# Patient Record
Sex: Male | Born: 1945 | Race: White | Hispanic: No | Smoking: Never smoker
Health system: Southern US, Community
[De-identification: ages and names within clinical notes are randomized; demographics above are authoritative.]

## PROBLEM LIST (undated history)

## (undated) DIAGNOSIS — E669 Obesity, unspecified: Secondary | ICD-10-CM

## (undated) DIAGNOSIS — Z95828 Presence of other vascular implants and grafts: Secondary | ICD-10-CM

## (undated) DIAGNOSIS — K921 Melena: Secondary | ICD-10-CM

## (undated) DIAGNOSIS — K56609 Unspecified intestinal obstruction, unspecified as to partial versus complete obstruction: Secondary | ICD-10-CM

## (undated) DIAGNOSIS — C44320 Squamous cell carcinoma of skin of unspecified parts of face: Secondary | ICD-10-CM

## (undated) DIAGNOSIS — E785 Hyperlipidemia, unspecified: Secondary | ICD-10-CM

## (undated) DIAGNOSIS — J189 Pneumonia, unspecified organism: Secondary | ICD-10-CM

## (undated) DIAGNOSIS — K7581 Nonalcoholic steatohepatitis (NASH): Secondary | ICD-10-CM

## (undated) DIAGNOSIS — R55 Syncope and collapse: Secondary | ICD-10-CM

## (undated) DIAGNOSIS — J90 Pleural effusion, not elsewhere classified: Secondary | ICD-10-CM

## (undated) DIAGNOSIS — K922 Gastrointestinal hemorrhage, unspecified: Secondary | ICD-10-CM

## (undated) DIAGNOSIS — N200 Calculus of kidney: Secondary | ICD-10-CM

## (undated) DIAGNOSIS — D689 Coagulation defect, unspecified: Secondary | ICD-10-CM

## (undated) DIAGNOSIS — D649 Anemia, unspecified: Secondary | ICD-10-CM

## (undated) DIAGNOSIS — K2210 Ulcer of esophagus without bleeding: Secondary | ICD-10-CM

## (undated) DIAGNOSIS — I10 Essential (primary) hypertension: Secondary | ICD-10-CM

## (undated) DIAGNOSIS — K219 Gastro-esophageal reflux disease without esophagitis: Secondary | ICD-10-CM

## (undated) DIAGNOSIS — F32A Depression, unspecified: Secondary | ICD-10-CM

## (undated) DIAGNOSIS — I482 Chronic atrial fibrillation, unspecified: Secondary | ICD-10-CM

## (undated) DIAGNOSIS — N189 Chronic kidney disease, unspecified: Secondary | ICD-10-CM

## (undated) DIAGNOSIS — I8500 Esophageal varices without bleeding: Secondary | ICD-10-CM

## (undated) DIAGNOSIS — M1990 Unspecified osteoarthritis, unspecified site: Secondary | ICD-10-CM

## (undated) DIAGNOSIS — I272 Pulmonary hypertension, unspecified: Secondary | ICD-10-CM

## (undated) DIAGNOSIS — C801 Malignant (primary) neoplasm, unspecified: Secondary | ICD-10-CM

## (undated) DIAGNOSIS — F329 Major depressive disorder, single episode, unspecified: Secondary | ICD-10-CM

## (undated) DIAGNOSIS — Z923 Personal history of irradiation: Secondary | ICD-10-CM

## (undated) DIAGNOSIS — R0602 Shortness of breath: Secondary | ICD-10-CM

## (undated) DIAGNOSIS — K5090 Crohn's disease, unspecified, without complications: Secondary | ICD-10-CM

## (undated) DIAGNOSIS — K449 Diaphragmatic hernia without obstruction or gangrene: Secondary | ICD-10-CM

## (undated) HISTORY — DX: Personal history of irradiation: Z92.3

## (undated) HISTORY — DX: Major depressive disorder, single episode, unspecified: F32.9

## (undated) HISTORY — DX: Ulcer of esophagus without bleeding: K22.10

## (undated) HISTORY — PX: CARDIAC CATHETERIZATION: SHX172

## (undated) HISTORY — PX: OTHER SURGICAL HISTORY: SHX169

## (undated) HISTORY — DX: Pleural effusion, not elsewhere classified: J90

## (undated) HISTORY — DX: Malignant (primary) neoplasm, unspecified: C80.1

## (undated) HISTORY — DX: Crohn's disease, unspecified, without complications: K50.90

## (undated) HISTORY — DX: Obesity, unspecified: E66.9

## (undated) HISTORY — DX: Squamous cell carcinoma of skin of unspecified parts of face: C44.320

## (undated) HISTORY — PX: CHOLECYSTECTOMY: SHX55

## (undated) HISTORY — DX: Unspecified intestinal obstruction, unspecified as to partial versus complete obstruction: K56.609

## (undated) HISTORY — DX: Anemia, unspecified: D64.9

## (undated) HISTORY — DX: Presence of other vascular implants and grafts: Z95.828

## (undated) HISTORY — DX: Calculus of kidney: N20.0

## (undated) HISTORY — PX: CIRCUMCISION: SUR203

## (undated) HISTORY — DX: Essential (primary) hypertension: I10

## (undated) HISTORY — DX: Nonalcoholic steatohepatitis (NASH): K75.81

## (undated) HISTORY — DX: Pulmonary hypertension, unspecified: I27.20

## (undated) HISTORY — DX: Gastrointestinal hemorrhage, unspecified: K92.2

## (undated) HISTORY — PX: COLONOSCOPY: SHX174

## (undated) HISTORY — DX: Depression, unspecified: F32.A

## (undated) HISTORY — DX: Syncope and collapse: R55

## (undated) HISTORY — DX: Chronic kidney disease, unspecified: N18.9

## (undated) HISTORY — DX: Diaphragmatic hernia without obstruction or gangrene: K44.9

## (undated) HISTORY — DX: Hyperlipidemia, unspecified: E78.5

## (undated) HISTORY — DX: Esophageal varices without bleeding: I85.00

## (undated) HISTORY — DX: Chronic atrial fibrillation, unspecified: I48.20

---

## 2000-07-21 ENCOUNTER — Encounter: Payer: Self-pay | Admitting: Internal Medicine

## 2000-07-21 ENCOUNTER — Ambulatory Visit (HOSPITAL_COMMUNITY): Admission: RE | Admit: 2000-07-21 | Discharge: 2000-07-21 | Payer: Self-pay | Admitting: Internal Medicine

## 2000-09-08 ENCOUNTER — Encounter (HOSPITAL_COMMUNITY): Admission: RE | Admit: 2000-09-08 | Discharge: 2000-10-08 | Payer: Self-pay | Admitting: Internal Medicine

## 2000-09-13 ENCOUNTER — Ambulatory Visit (HOSPITAL_COMMUNITY): Admission: RE | Admit: 2000-09-13 | Discharge: 2000-09-13 | Payer: Self-pay | Admitting: Internal Medicine

## 2000-09-13 ENCOUNTER — Encounter: Payer: Self-pay | Admitting: Internal Medicine

## 2002-04-02 ENCOUNTER — Ambulatory Visit (HOSPITAL_COMMUNITY): Admission: RE | Admit: 2002-04-02 | Discharge: 2002-04-02 | Payer: Self-pay | Admitting: Internal Medicine

## 2002-04-02 ENCOUNTER — Encounter: Payer: Self-pay | Admitting: Internal Medicine

## 2002-04-16 ENCOUNTER — Ambulatory Visit (HOSPITAL_COMMUNITY): Admission: RE | Admit: 2002-04-16 | Discharge: 2002-04-16 | Payer: Self-pay | Admitting: Urology

## 2002-04-16 ENCOUNTER — Encounter: Payer: Self-pay | Admitting: Urology

## 2002-08-06 ENCOUNTER — Inpatient Hospital Stay (HOSPITAL_COMMUNITY): Admission: AD | Admit: 2002-08-06 | Discharge: 2002-08-13 | Payer: Self-pay | Admitting: Internal Medicine

## 2002-08-09 ENCOUNTER — Encounter (INDEPENDENT_AMBULATORY_CARE_PROVIDER_SITE_OTHER): Payer: Self-pay | Admitting: Internal Medicine

## 2002-08-30 ENCOUNTER — Encounter: Payer: Self-pay | Admitting: Internal Medicine

## 2002-08-30 ENCOUNTER — Ambulatory Visit (HOSPITAL_COMMUNITY): Admission: RE | Admit: 2002-08-30 | Discharge: 2002-08-30 | Payer: Self-pay | Admitting: Internal Medicine

## 2002-09-04 ENCOUNTER — Ambulatory Visit (HOSPITAL_COMMUNITY): Admission: RE | Admit: 2002-09-04 | Discharge: 2002-09-04 | Payer: Self-pay | Admitting: Internal Medicine

## 2002-09-04 ENCOUNTER — Encounter: Payer: Self-pay | Admitting: Internal Medicine

## 2002-09-04 ENCOUNTER — Encounter (INDEPENDENT_AMBULATORY_CARE_PROVIDER_SITE_OTHER): Payer: Self-pay | Admitting: Specialist

## 2002-09-30 ENCOUNTER — Encounter: Payer: Self-pay | Admitting: Emergency Medicine

## 2002-09-30 ENCOUNTER — Inpatient Hospital Stay (HOSPITAL_COMMUNITY): Admission: EM | Admit: 2002-09-30 | Discharge: 2002-10-04 | Payer: Self-pay | Admitting: Emergency Medicine

## 2002-12-04 ENCOUNTER — Ambulatory Visit (HOSPITAL_COMMUNITY): Admission: RE | Admit: 2002-12-04 | Discharge: 2002-12-04 | Payer: Self-pay | Admitting: Urology

## 2003-11-25 ENCOUNTER — Ambulatory Visit: Payer: Self-pay | Admitting: *Deleted

## 2003-12-06 ENCOUNTER — Ambulatory Visit: Payer: Self-pay | Admitting: Internal Medicine

## 2003-12-06 ENCOUNTER — Observation Stay (HOSPITAL_COMMUNITY): Admission: AD | Admit: 2003-12-06 | Discharge: 2003-12-07 | Payer: Self-pay | Admitting: Internal Medicine

## 2003-12-16 ENCOUNTER — Ambulatory Visit: Payer: Self-pay | Admitting: *Deleted

## 2003-12-19 ENCOUNTER — Ambulatory Visit: Payer: Self-pay | Admitting: Gastroenterology

## 2004-01-06 ENCOUNTER — Ambulatory Visit (HOSPITAL_COMMUNITY): Admission: RE | Admit: 2004-01-06 | Discharge: 2004-01-06 | Payer: Self-pay | Admitting: Family Medicine

## 2004-01-14 ENCOUNTER — Encounter: Admission: RE | Admit: 2004-01-14 | Discharge: 2004-01-14 | Payer: Self-pay | Admitting: Oncology

## 2004-01-14 ENCOUNTER — Encounter (HOSPITAL_COMMUNITY): Admission: RE | Admit: 2004-01-14 | Discharge: 2004-02-13 | Payer: Self-pay | Admitting: Oncology

## 2004-01-14 ENCOUNTER — Ambulatory Visit (HOSPITAL_COMMUNITY): Payer: Self-pay | Admitting: Oncology

## 2004-01-15 ENCOUNTER — Ambulatory Visit (HOSPITAL_COMMUNITY): Admission: RE | Admit: 2004-01-15 | Discharge: 2004-01-15 | Payer: Self-pay | Admitting: Urology

## 2004-01-15 ENCOUNTER — Ambulatory Visit: Payer: Self-pay | Admitting: *Deleted

## 2004-01-24 ENCOUNTER — Ambulatory Visit: Payer: Self-pay | Admitting: *Deleted

## 2004-01-25 ENCOUNTER — Inpatient Hospital Stay (HOSPITAL_COMMUNITY): Admission: EM | Admit: 2004-01-25 | Discharge: 2004-01-29 | Payer: Self-pay | Admitting: Emergency Medicine

## 2004-01-25 ENCOUNTER — Ambulatory Visit: Payer: Self-pay | Admitting: Internal Medicine

## 2004-01-27 ENCOUNTER — Ambulatory Visit: Payer: Self-pay | Admitting: Internal Medicine

## 2004-02-07 ENCOUNTER — Ambulatory Visit: Payer: Self-pay | Admitting: Internal Medicine

## 2004-02-10 ENCOUNTER — Ambulatory Visit: Payer: Self-pay | Admitting: Cardiology

## 2004-02-11 ENCOUNTER — Ambulatory Visit (HOSPITAL_COMMUNITY): Admission: RE | Admit: 2004-02-11 | Discharge: 2004-02-11 | Payer: Self-pay | Admitting: Internal Medicine

## 2004-02-14 ENCOUNTER — Encounter: Admission: RE | Admit: 2004-02-14 | Discharge: 2004-02-14 | Payer: Self-pay | Admitting: Oncology

## 2004-02-14 ENCOUNTER — Encounter (HOSPITAL_COMMUNITY): Admission: RE | Admit: 2004-02-14 | Discharge: 2004-03-15 | Payer: Self-pay | Admitting: Oncology

## 2004-03-11 ENCOUNTER — Ambulatory Visit: Payer: Self-pay | Admitting: Internal Medicine

## 2004-03-23 ENCOUNTER — Encounter (HOSPITAL_COMMUNITY): Admission: RE | Admit: 2004-03-23 | Discharge: 2004-04-22 | Payer: Self-pay | Admitting: Oncology

## 2004-03-23 ENCOUNTER — Encounter: Admission: RE | Admit: 2004-03-23 | Discharge: 2004-03-23 | Payer: Self-pay | Admitting: Oncology

## 2004-03-23 ENCOUNTER — Ambulatory Visit (HOSPITAL_COMMUNITY): Payer: Self-pay | Admitting: Oncology

## 2004-04-24 ENCOUNTER — Encounter: Admission: RE | Admit: 2004-04-24 | Discharge: 2004-04-24 | Payer: Self-pay | Admitting: Oncology

## 2004-05-12 ENCOUNTER — Ambulatory Visit: Payer: Self-pay | Admitting: Cardiology

## 2004-05-20 ENCOUNTER — Ambulatory Visit: Payer: Self-pay | Admitting: *Deleted

## 2004-06-19 ENCOUNTER — Ambulatory Visit (HOSPITAL_COMMUNITY): Payer: Self-pay | Admitting: Oncology

## 2004-06-19 ENCOUNTER — Encounter: Admission: RE | Admit: 2004-06-19 | Discharge: 2004-06-19 | Payer: Self-pay | Admitting: Oncology

## 2004-07-30 ENCOUNTER — Ambulatory Visit: Payer: Self-pay | Admitting: Internal Medicine

## 2004-08-24 ENCOUNTER — Encounter (HOSPITAL_COMMUNITY): Admission: RE | Admit: 2004-08-24 | Discharge: 2004-09-23 | Payer: Self-pay | Admitting: Oncology

## 2004-08-24 ENCOUNTER — Encounter: Admission: RE | Admit: 2004-08-24 | Discharge: 2004-08-24 | Payer: Self-pay | Admitting: Oncology

## 2004-08-25 ENCOUNTER — Ambulatory Visit (HOSPITAL_COMMUNITY): Payer: Self-pay | Admitting: Oncology

## 2004-09-28 ENCOUNTER — Encounter (HOSPITAL_COMMUNITY): Admission: RE | Admit: 2004-09-28 | Discharge: 2004-10-02 | Payer: Self-pay | Admitting: Oncology

## 2004-09-28 ENCOUNTER — Encounter: Admission: RE | Admit: 2004-09-28 | Discharge: 2004-10-02 | Payer: Self-pay | Admitting: Oncology

## 2004-09-28 ENCOUNTER — Ambulatory Visit: Payer: Self-pay | Admitting: Internal Medicine

## 2004-09-28 ENCOUNTER — Ambulatory Visit: Payer: Self-pay | Admitting: Cardiology

## 2004-10-05 ENCOUNTER — Encounter (HOSPITAL_COMMUNITY): Admission: RE | Admit: 2004-10-05 | Discharge: 2004-11-04 | Payer: Self-pay | Admitting: Oncology

## 2004-10-05 ENCOUNTER — Encounter: Admission: RE | Admit: 2004-10-05 | Discharge: 2004-10-05 | Payer: Self-pay | Admitting: Oncology

## 2004-10-05 ENCOUNTER — Ambulatory Visit: Payer: Self-pay | Admitting: Cardiology

## 2004-10-12 ENCOUNTER — Ambulatory Visit (HOSPITAL_COMMUNITY): Payer: Self-pay | Admitting: Oncology

## 2004-11-03 ENCOUNTER — Ambulatory Visit: Payer: Self-pay | Admitting: Cardiology

## 2004-12-02 ENCOUNTER — Ambulatory Visit: Payer: Self-pay | Admitting: Internal Medicine

## 2005-02-12 ENCOUNTER — Inpatient Hospital Stay (HOSPITAL_COMMUNITY): Admission: AD | Admit: 2005-02-12 | Discharge: 2005-02-14 | Payer: Self-pay | Admitting: Internal Medicine

## 2005-02-12 ENCOUNTER — Ambulatory Visit: Payer: Self-pay | Admitting: Urgent Care

## 2005-02-22 ENCOUNTER — Encounter (HOSPITAL_COMMUNITY): Admission: RE | Admit: 2005-02-22 | Discharge: 2005-03-24 | Payer: Self-pay | Admitting: Oncology

## 2005-02-22 ENCOUNTER — Ambulatory Visit (HOSPITAL_COMMUNITY): Payer: Self-pay | Admitting: Oncology

## 2005-02-22 ENCOUNTER — Encounter: Admission: RE | Admit: 2005-02-22 | Discharge: 2005-02-22 | Payer: Self-pay | Admitting: Oncology

## 2005-03-29 ENCOUNTER — Ambulatory Visit: Payer: Self-pay | Admitting: Internal Medicine

## 2005-04-05 ENCOUNTER — Encounter: Admission: RE | Admit: 2005-04-05 | Discharge: 2005-04-05 | Payer: Self-pay | Admitting: Oncology

## 2005-04-05 ENCOUNTER — Encounter (HOSPITAL_COMMUNITY): Admission: RE | Admit: 2005-04-05 | Discharge: 2005-05-05 | Payer: Self-pay | Admitting: Oncology

## 2005-04-23 ENCOUNTER — Ambulatory Visit (HOSPITAL_COMMUNITY): Payer: Self-pay | Admitting: Oncology

## 2005-05-18 ENCOUNTER — Ambulatory Visit (HOSPITAL_COMMUNITY): Admission: RE | Admit: 2005-05-18 | Discharge: 2005-05-18 | Payer: Self-pay | Admitting: Family Medicine

## 2005-05-19 ENCOUNTER — Encounter: Admission: RE | Admit: 2005-05-19 | Discharge: 2005-05-19 | Payer: Self-pay | Admitting: Oncology

## 2005-05-19 ENCOUNTER — Encounter (HOSPITAL_COMMUNITY): Admission: RE | Admit: 2005-05-19 | Discharge: 2005-06-18 | Payer: Self-pay | Admitting: Oncology

## 2005-06-15 ENCOUNTER — Ambulatory Visit (HOSPITAL_COMMUNITY): Payer: Self-pay | Admitting: Oncology

## 2005-06-21 ENCOUNTER — Ambulatory Visit: Payer: Self-pay | Admitting: Internal Medicine

## 2005-07-06 ENCOUNTER — Encounter: Admission: RE | Admit: 2005-07-06 | Discharge: 2005-07-06 | Payer: Self-pay | Admitting: Oncology

## 2005-07-06 ENCOUNTER — Encounter (HOSPITAL_COMMUNITY): Admission: RE | Admit: 2005-07-06 | Discharge: 2005-08-05 | Payer: Self-pay | Admitting: Oncology

## 2005-07-14 ENCOUNTER — Ambulatory Visit: Payer: Self-pay | Admitting: Cardiology

## 2005-07-14 ENCOUNTER — Ambulatory Visit (HOSPITAL_COMMUNITY): Admission: RE | Admit: 2005-07-14 | Discharge: 2005-07-14 | Payer: Self-pay | Admitting: Internal Medicine

## 2005-07-14 ENCOUNTER — Ambulatory Visit: Payer: Self-pay | Admitting: Internal Medicine

## 2005-07-14 HISTORY — PX: ESOPHAGOGASTRODUODENOSCOPY: SHX1529

## 2005-08-10 ENCOUNTER — Emergency Department (HOSPITAL_COMMUNITY): Admission: EM | Admit: 2005-08-10 | Discharge: 2005-08-10 | Payer: Self-pay | Admitting: Emergency Medicine

## 2005-08-10 ENCOUNTER — Ambulatory Visit: Payer: Self-pay | Admitting: Cardiology

## 2005-08-10 ENCOUNTER — Inpatient Hospital Stay (HOSPITAL_COMMUNITY): Admission: EM | Admit: 2005-08-10 | Discharge: 2005-08-14 | Payer: Self-pay | Admitting: Emergency Medicine

## 2005-08-14 ENCOUNTER — Emergency Department (HOSPITAL_COMMUNITY): Admission: EM | Admit: 2005-08-14 | Discharge: 2005-08-15 | Payer: Self-pay | Admitting: Emergency Medicine

## 2005-08-17 ENCOUNTER — Encounter: Admission: RE | Admit: 2005-08-17 | Discharge: 2005-08-17 | Payer: Self-pay | Admitting: Oncology

## 2005-09-29 ENCOUNTER — Ambulatory Visit (HOSPITAL_COMMUNITY): Payer: Self-pay | Admitting: Oncology

## 2005-09-29 ENCOUNTER — Encounter: Admission: RE | Admit: 2005-09-29 | Discharge: 2005-10-02 | Payer: Self-pay | Admitting: Oncology

## 2005-10-05 ENCOUNTER — Ambulatory Visit: Payer: Self-pay | Admitting: Internal Medicine

## 2005-10-20 ENCOUNTER — Encounter (HOSPITAL_COMMUNITY): Admission: RE | Admit: 2005-10-20 | Discharge: 2005-11-19 | Payer: Self-pay | Admitting: Oncology

## 2005-10-20 ENCOUNTER — Encounter: Admission: RE | Admit: 2005-10-20 | Discharge: 2005-10-20 | Payer: Self-pay | Admitting: Oncology

## 2005-11-17 ENCOUNTER — Ambulatory Visit (HOSPITAL_COMMUNITY): Payer: Self-pay | Admitting: Oncology

## 2005-11-22 ENCOUNTER — Ambulatory Visit: Payer: Self-pay | Admitting: Internal Medicine

## 2005-11-23 ENCOUNTER — Ambulatory Visit (HOSPITAL_COMMUNITY): Admission: RE | Admit: 2005-11-23 | Discharge: 2005-11-23 | Payer: Self-pay | Admitting: Internal Medicine

## 2005-11-23 ENCOUNTER — Encounter (INDEPENDENT_AMBULATORY_CARE_PROVIDER_SITE_OTHER): Payer: Self-pay | Admitting: Specialist

## 2005-12-07 ENCOUNTER — Ambulatory Visit: Payer: Self-pay | Admitting: Internal Medicine

## 2005-12-09 ENCOUNTER — Ambulatory Visit: Payer: Self-pay | Admitting: Cardiology

## 2005-12-21 ENCOUNTER — Ambulatory Visit: Payer: Self-pay | Admitting: Internal Medicine

## 2005-12-24 ENCOUNTER — Ambulatory Visit: Payer: Self-pay | Admitting: Cardiology

## 2006-01-04 DIAGNOSIS — R55 Syncope and collapse: Secondary | ICD-10-CM

## 2006-01-04 HISTORY — DX: Syncope and collapse: R55

## 2006-01-04 HISTORY — PX: THORACIC SPINE SURGERY: SHX802

## 2006-01-06 ENCOUNTER — Emergency Department (HOSPITAL_COMMUNITY): Admission: EM | Admit: 2006-01-06 | Discharge: 2006-01-06 | Payer: Self-pay | Admitting: Emergency Medicine

## 2006-01-19 ENCOUNTER — Ambulatory Visit (HOSPITAL_COMMUNITY): Payer: Self-pay | Admitting: Oncology

## 2006-01-19 ENCOUNTER — Encounter (HOSPITAL_COMMUNITY): Admission: RE | Admit: 2006-01-19 | Discharge: 2006-02-18 | Payer: Self-pay | Admitting: Oncology

## 2006-02-03 ENCOUNTER — Inpatient Hospital Stay (HOSPITAL_COMMUNITY): Admission: EM | Admit: 2006-02-03 | Discharge: 2006-02-17 | Payer: Self-pay | Admitting: Emergency Medicine

## 2006-02-04 ENCOUNTER — Ambulatory Visit: Payer: Self-pay | Admitting: Vascular Surgery

## 2006-02-04 ENCOUNTER — Encounter: Payer: Self-pay | Admitting: Emergency Medicine

## 2006-02-04 ENCOUNTER — Encounter: Payer: Self-pay | Admitting: Vascular Surgery

## 2006-02-05 ENCOUNTER — Encounter: Payer: Self-pay | Admitting: Neurological Surgery

## 2006-02-05 ENCOUNTER — Ambulatory Visit: Payer: Self-pay | Admitting: Cardiology

## 2006-02-09 ENCOUNTER — Encounter: Payer: Self-pay | Admitting: Cardiology

## 2006-02-10 ENCOUNTER — Ambulatory Visit: Payer: Self-pay | Admitting: Gastroenterology

## 2006-04-15 ENCOUNTER — Ambulatory Visit (HOSPITAL_COMMUNITY): Payer: Self-pay | Admitting: Oncology

## 2006-04-15 ENCOUNTER — Encounter (HOSPITAL_COMMUNITY): Admission: RE | Admit: 2006-04-15 | Discharge: 2006-05-15 | Payer: Self-pay | Admitting: Oncology

## 2006-05-06 ENCOUNTER — Ambulatory Visit (HOSPITAL_COMMUNITY): Payer: Self-pay | Admitting: Oncology

## 2006-06-16 ENCOUNTER — Encounter (HOSPITAL_COMMUNITY): Admission: RE | Admit: 2006-06-16 | Discharge: 2006-07-16 | Payer: Self-pay | Admitting: Internal Medicine

## 2006-06-21 ENCOUNTER — Encounter (HOSPITAL_COMMUNITY): Admission: RE | Admit: 2006-06-21 | Discharge: 2006-07-21 | Payer: Self-pay | Admitting: Oncology

## 2006-06-21 ENCOUNTER — Ambulatory Visit (HOSPITAL_COMMUNITY): Payer: Self-pay | Admitting: Oncology

## 2006-07-18 ENCOUNTER — Encounter (HOSPITAL_COMMUNITY): Admission: RE | Admit: 2006-07-18 | Discharge: 2006-07-25 | Payer: Self-pay | Admitting: Internal Medicine

## 2006-07-26 ENCOUNTER — Ambulatory Visit: Payer: Self-pay | Admitting: Cardiovascular Disease

## 2006-07-26 ENCOUNTER — Inpatient Hospital Stay (HOSPITAL_COMMUNITY): Admission: AD | Admit: 2006-07-26 | Discharge: 2006-07-28 | Payer: Self-pay

## 2006-07-26 ENCOUNTER — Ambulatory Visit: Payer: Self-pay | Admitting: Cardiology

## 2006-07-27 ENCOUNTER — Encounter: Payer: Self-pay | Admitting: Cardiovascular Disease

## 2006-07-29 LAB — CONVERTED CEMR LAB
Ferritin: 21 ng/mL
OCCULT 1: NEGATIVE

## 2006-08-01 ENCOUNTER — Ambulatory Visit (HOSPITAL_COMMUNITY): Admission: RE | Admit: 2006-08-01 | Discharge: 2006-08-02 | Payer: Self-pay | Admitting: Urology

## 2006-08-03 ENCOUNTER — Encounter (HOSPITAL_COMMUNITY): Admission: RE | Admit: 2006-08-03 | Discharge: 2006-08-04 | Payer: Self-pay | Admitting: Oncology

## 2006-08-03 ENCOUNTER — Ambulatory Visit (HOSPITAL_COMMUNITY): Admission: RE | Admit: 2006-08-03 | Discharge: 2006-08-03 | Payer: Self-pay | Admitting: Family Medicine

## 2006-08-03 ENCOUNTER — Encounter (HOSPITAL_COMMUNITY): Admission: RE | Admit: 2006-08-03 | Discharge: 2006-08-04 | Payer: Self-pay | Admitting: Internal Medicine

## 2006-08-04 ENCOUNTER — Ambulatory Visit: Payer: Self-pay | Admitting: Cardiology

## 2006-08-05 ENCOUNTER — Encounter (HOSPITAL_COMMUNITY): Admission: RE | Admit: 2006-08-05 | Discharge: 2006-09-04 | Payer: Self-pay | Admitting: Internal Medicine

## 2006-08-05 ENCOUNTER — Ambulatory Visit: Payer: Self-pay | Admitting: Cardiology

## 2006-08-11 ENCOUNTER — Ambulatory Visit: Payer: Self-pay | Admitting: Internal Medicine

## 2006-08-17 ENCOUNTER — Emergency Department (HOSPITAL_COMMUNITY): Admission: EM | Admit: 2006-08-17 | Discharge: 2006-08-17 | Payer: Self-pay | Admitting: Emergency Medicine

## 2006-08-24 ENCOUNTER — Encounter (HOSPITAL_COMMUNITY): Admission: RE | Admit: 2006-08-24 | Discharge: 2006-09-23 | Payer: Self-pay | Admitting: Oncology

## 2006-08-24 ENCOUNTER — Ambulatory Visit (HOSPITAL_COMMUNITY): Payer: Self-pay | Admitting: Oncology

## 2006-09-02 ENCOUNTER — Ambulatory Visit: Payer: Self-pay | Admitting: Cardiology

## 2006-09-07 ENCOUNTER — Encounter (HOSPITAL_COMMUNITY): Admission: RE | Admit: 2006-09-07 | Discharge: 2006-10-04 | Payer: Self-pay | Admitting: Internal Medicine

## 2006-09-16 ENCOUNTER — Ambulatory Visit: Payer: Self-pay | Admitting: Cardiology

## 2006-10-06 ENCOUNTER — Encounter (HOSPITAL_COMMUNITY): Admission: RE | Admit: 2006-10-06 | Discharge: 2006-11-05 | Payer: Self-pay | Admitting: Oncology

## 2006-10-17 ENCOUNTER — Ambulatory Visit (HOSPITAL_COMMUNITY): Payer: Self-pay | Admitting: Oncology

## 2006-11-08 ENCOUNTER — Ambulatory Visit (HOSPITAL_COMMUNITY): Admission: RE | Admit: 2006-11-08 | Discharge: 2006-11-08 | Payer: Self-pay | Admitting: Cardiology

## 2006-11-08 ENCOUNTER — Ambulatory Visit: Payer: Self-pay | Admitting: Cardiology

## 2006-11-25 ENCOUNTER — Encounter (HOSPITAL_COMMUNITY): Admission: RE | Admit: 2006-11-25 | Discharge: 2006-12-25 | Payer: Self-pay | Admitting: Oncology

## 2006-12-16 ENCOUNTER — Ambulatory Visit (HOSPITAL_COMMUNITY): Payer: Self-pay | Admitting: Oncology

## 2007-01-06 ENCOUNTER — Encounter (HOSPITAL_COMMUNITY): Admission: RE | Admit: 2007-01-06 | Discharge: 2007-02-05 | Payer: Self-pay | Admitting: Oncology

## 2007-02-16 ENCOUNTER — Ambulatory Visit (HOSPITAL_COMMUNITY): Admission: RE | Admit: 2007-02-16 | Discharge: 2007-02-16 | Payer: Self-pay | Admitting: Family Medicine

## 2007-02-17 ENCOUNTER — Ambulatory Visit (HOSPITAL_COMMUNITY): Admission: RE | Admit: 2007-02-17 | Discharge: 2007-02-17 | Payer: Self-pay | Admitting: Family Medicine

## 2007-02-17 ENCOUNTER — Encounter: Payer: Self-pay | Admitting: Pulmonary Disease

## 2007-02-17 ENCOUNTER — Ambulatory Visit (HOSPITAL_COMMUNITY): Payer: Self-pay | Admitting: Oncology

## 2007-02-17 ENCOUNTER — Encounter (HOSPITAL_COMMUNITY): Admission: RE | Admit: 2007-02-17 | Discharge: 2007-03-19 | Payer: Self-pay | Admitting: Oncology

## 2007-02-22 ENCOUNTER — Ambulatory Visit: Payer: Self-pay | Admitting: Pulmonary Disease

## 2007-02-22 DIAGNOSIS — E785 Hyperlipidemia, unspecified: Secondary | ICD-10-CM

## 2007-02-22 DIAGNOSIS — J90 Pleural effusion, not elsewhere classified: Secondary | ICD-10-CM | POA: Insufficient documentation

## 2007-02-24 ENCOUNTER — Ambulatory Visit (HOSPITAL_COMMUNITY): Admission: RE | Admit: 2007-02-24 | Discharge: 2007-02-24 | Payer: Self-pay | Admitting: Pulmonary Disease

## 2007-02-24 ENCOUNTER — Encounter (INDEPENDENT_AMBULATORY_CARE_PROVIDER_SITE_OTHER): Payer: Self-pay | Admitting: Interventional Radiology

## 2007-02-24 LAB — CONVERTED CEMR LAB
Basophils Relative: 0.5 % (ref 0.0–1.0)
Eosinophils Relative: 1.5 % (ref 0.0–5.0)
HCT: 31.4 % — ABNORMAL LOW (ref 39.0–52.0)
Hemoglobin: 9.9 g/dL — ABNORMAL LOW (ref 13.0–17.0)
INR: 1.2 — ABNORMAL HIGH (ref 0.8–1.0)
Monocytes Absolute: 0.6 10*3/uL (ref 0.2–0.7)
Monocytes Relative: 9.5 % (ref 3.0–11.0)
Neutrophils Relative %: 81.1 % — ABNORMAL HIGH (ref 43.0–77.0)
Prothrombin Time: 13.2 s (ref 10.9–13.3)
RDW: 19.5 % — ABNORMAL HIGH (ref 11.5–14.6)
WBC: 6.4 10*3/uL (ref 4.5–10.5)
aPTT: 27.9 s (ref 21.7–29.8)

## 2007-03-03 ENCOUNTER — Ambulatory Visit: Payer: Self-pay | Admitting: Internal Medicine

## 2007-03-09 ENCOUNTER — Encounter (INDEPENDENT_AMBULATORY_CARE_PROVIDER_SITE_OTHER): Payer: Self-pay | Admitting: Diagnostic Radiology

## 2007-03-09 ENCOUNTER — Ambulatory Visit (HOSPITAL_COMMUNITY): Admission: RE | Admit: 2007-03-09 | Discharge: 2007-03-09 | Payer: Self-pay | Admitting: Internal Medicine

## 2007-03-22 ENCOUNTER — Ambulatory Visit: Payer: Self-pay | Admitting: Internal Medicine

## 2007-03-24 ENCOUNTER — Ambulatory Visit (HOSPITAL_COMMUNITY): Admission: RE | Admit: 2007-03-24 | Discharge: 2007-03-24 | Payer: Self-pay | Admitting: Internal Medicine

## 2007-03-30 ENCOUNTER — Encounter (HOSPITAL_COMMUNITY): Admission: RE | Admit: 2007-03-30 | Discharge: 2007-04-29 | Payer: Self-pay | Admitting: Oncology

## 2007-04-06 ENCOUNTER — Ambulatory Visit: Payer: Self-pay | Admitting: Pulmonary Disease

## 2007-04-11 ENCOUNTER — Ambulatory Visit (HOSPITAL_COMMUNITY): Admission: RE | Admit: 2007-04-11 | Discharge: 2007-04-11 | Payer: Self-pay | Admitting: Internal Medicine

## 2007-04-20 ENCOUNTER — Ambulatory Visit (HOSPITAL_COMMUNITY): Payer: Self-pay | Admitting: Oncology

## 2007-05-10 ENCOUNTER — Encounter (HOSPITAL_COMMUNITY): Admission: RE | Admit: 2007-05-10 | Discharge: 2007-06-09 | Payer: Self-pay | Admitting: Oncology

## 2007-06-21 ENCOUNTER — Encounter (HOSPITAL_COMMUNITY): Admission: RE | Admit: 2007-06-21 | Discharge: 2007-07-21 | Payer: Self-pay | Admitting: Oncology

## 2007-06-21 ENCOUNTER — Ambulatory Visit (HOSPITAL_COMMUNITY): Payer: Self-pay | Admitting: Oncology

## 2007-07-03 ENCOUNTER — Inpatient Hospital Stay (HOSPITAL_COMMUNITY): Admission: EM | Admit: 2007-07-03 | Discharge: 2007-07-06 | Payer: Self-pay | Admitting: Emergency Medicine

## 2007-07-03 ENCOUNTER — Ambulatory Visit: Payer: Self-pay | Admitting: Cardiovascular Disease

## 2007-07-04 ENCOUNTER — Ambulatory Visit: Payer: Self-pay | Admitting: Gastroenterology

## 2007-07-05 ENCOUNTER — Ambulatory Visit: Payer: Self-pay | Admitting: Gastroenterology

## 2007-07-06 LAB — CONVERTED CEMR LAB: TSH: 3.8 microintl units/mL

## 2007-08-02 ENCOUNTER — Ambulatory Visit (HOSPITAL_COMMUNITY): Payer: Self-pay | Admitting: Oncology

## 2007-08-02 ENCOUNTER — Encounter (HOSPITAL_COMMUNITY): Admission: RE | Admit: 2007-08-02 | Discharge: 2007-09-01 | Payer: Self-pay | Admitting: Oncology

## 2007-08-23 ENCOUNTER — Ambulatory Visit (HOSPITAL_COMMUNITY): Payer: Self-pay | Admitting: Oncology

## 2007-09-05 ENCOUNTER — Ambulatory Visit: Payer: Self-pay | Admitting: Internal Medicine

## 2007-09-13 ENCOUNTER — Encounter (HOSPITAL_COMMUNITY): Admission: RE | Admit: 2007-09-13 | Discharge: 2007-10-13 | Payer: Self-pay | Admitting: Oncology

## 2007-10-25 ENCOUNTER — Encounter (HOSPITAL_COMMUNITY): Admission: RE | Admit: 2007-10-25 | Discharge: 2007-11-24 | Payer: Self-pay | Admitting: Oncology

## 2007-10-25 ENCOUNTER — Ambulatory Visit (HOSPITAL_COMMUNITY): Payer: Self-pay | Admitting: Oncology

## 2007-10-27 ENCOUNTER — Ambulatory Visit: Payer: Self-pay | Admitting: Cardiology

## 2007-12-19 ENCOUNTER — Ambulatory Visit: Payer: Self-pay | Admitting: Internal Medicine

## 2007-12-22 ENCOUNTER — Ambulatory Visit (HOSPITAL_COMMUNITY): Payer: Self-pay | Admitting: Oncology

## 2007-12-22 ENCOUNTER — Encounter (HOSPITAL_COMMUNITY): Admission: RE | Admit: 2007-12-22 | Discharge: 2008-01-21 | Payer: Self-pay | Admitting: Oncology

## 2007-12-25 ENCOUNTER — Encounter: Payer: Self-pay | Admitting: Internal Medicine

## 2007-12-25 LAB — CONVERTED CEMR LAB
CO2: 22 meq/L (ref 19–32)
Chloride: 98 meq/L (ref 96–112)
Creatinine, Ser: 1.27 mg/dL (ref 0.40–1.50)

## 2008-01-12 ENCOUNTER — Encounter: Payer: Self-pay | Admitting: Gastroenterology

## 2008-01-12 LAB — CONVERTED CEMR LAB
BUN: 34 mg/dL — ABNORMAL HIGH (ref 6–23)
Chloride: 101 meq/L (ref 96–112)
Creatinine, Ser: 1.27 mg/dL (ref 0.40–1.50)

## 2008-01-13 ENCOUNTER — Inpatient Hospital Stay (HOSPITAL_COMMUNITY): Admission: EM | Admit: 2008-01-13 | Discharge: 2008-01-17 | Payer: Self-pay | Admitting: Emergency Medicine

## 2008-01-13 ENCOUNTER — Ambulatory Visit: Payer: Self-pay | Admitting: Internal Medicine

## 2008-01-16 ENCOUNTER — Ambulatory Visit: Payer: Self-pay | Admitting: Internal Medicine

## 2008-01-17 ENCOUNTER — Ambulatory Visit: Payer: Self-pay | Admitting: Internal Medicine

## 2008-01-21 ENCOUNTER — Inpatient Hospital Stay (HOSPITAL_COMMUNITY): Admission: EM | Admit: 2008-01-21 | Discharge: 2008-01-25 | Payer: Self-pay | Admitting: Emergency Medicine

## 2008-01-22 ENCOUNTER — Ambulatory Visit: Payer: Self-pay | Admitting: Internal Medicine

## 2008-01-24 ENCOUNTER — Ambulatory Visit: Payer: Self-pay | Admitting: Gastroenterology

## 2008-02-02 ENCOUNTER — Encounter (HOSPITAL_COMMUNITY): Admission: RE | Admit: 2008-02-02 | Discharge: 2008-03-03 | Payer: Self-pay | Admitting: Oncology

## 2008-02-22 ENCOUNTER — Ambulatory Visit: Payer: Self-pay | Admitting: Internal Medicine

## 2008-02-23 ENCOUNTER — Ambulatory Visit (HOSPITAL_COMMUNITY): Payer: Self-pay | Admitting: Oncology

## 2008-02-23 ENCOUNTER — Encounter: Payer: Self-pay | Admitting: Internal Medicine

## 2008-02-27 LAB — CONVERTED CEMR LAB
ALT: 13 units/L (ref 0–53)
AST: 17 units/L (ref 0–37)
Albumin: 3.1 g/dL — ABNORMAL LOW (ref 3.5–5.2)
Basophils Absolute: 0 10*3/uL (ref 0.0–0.1)
Basophils Relative: 0 % (ref 0–1)
Calcium: 8.7 mg/dL (ref 8.4–10.5)
Chloride: 97 meq/L (ref 96–112)
MCHC: 30.8 g/dL (ref 30.0–36.0)
Monocytes Relative: 10 % (ref 3–12)
Neutro Abs: 5.1 10*3/uL (ref 1.7–7.7)
Neutrophils Relative %: 81 % — ABNORMAL HIGH (ref 43–77)
Potassium: 5 meq/L (ref 3.5–5.3)
RBC: 4.21 M/uL — ABNORMAL LOW (ref 4.22–5.81)
RDW: 17.3 % — ABNORMAL HIGH (ref 11.5–15.5)
Sodium: 133 meq/L — ABNORMAL LOW (ref 135–145)

## 2008-03-04 ENCOUNTER — Ambulatory Visit (HOSPITAL_COMMUNITY): Admission: RE | Admit: 2008-03-04 | Discharge: 2008-03-04 | Payer: Self-pay | Admitting: Internal Medicine

## 2008-04-02 ENCOUNTER — Encounter (HOSPITAL_COMMUNITY): Admission: RE | Admit: 2008-04-02 | Discharge: 2008-05-03 | Payer: Self-pay | Admitting: Oncology

## 2008-04-23 ENCOUNTER — Ambulatory Visit (HOSPITAL_COMMUNITY): Payer: Self-pay | Admitting: Oncology

## 2008-04-29 ENCOUNTER — Encounter (INDEPENDENT_AMBULATORY_CARE_PROVIDER_SITE_OTHER): Payer: Self-pay

## 2008-05-01 ENCOUNTER — Encounter: Payer: Self-pay | Admitting: Internal Medicine

## 2008-05-01 ENCOUNTER — Encounter: Payer: Self-pay | Admitting: Gastroenterology

## 2008-05-07 ENCOUNTER — Encounter: Payer: Self-pay | Admitting: Internal Medicine

## 2008-05-15 ENCOUNTER — Encounter (HOSPITAL_COMMUNITY): Admission: RE | Admit: 2008-05-15 | Discharge: 2008-05-16 | Payer: Self-pay | Admitting: Oncology

## 2008-05-16 ENCOUNTER — Encounter: Payer: Self-pay | Admitting: Emergency Medicine

## 2008-05-17 ENCOUNTER — Ambulatory Visit: Payer: Self-pay | Admitting: Internal Medicine

## 2008-05-17 ENCOUNTER — Inpatient Hospital Stay (HOSPITAL_COMMUNITY): Admission: EM | Admit: 2008-05-17 | Discharge: 2008-05-20 | Payer: Self-pay | Admitting: Internal Medicine

## 2008-05-17 LAB — CONVERTED CEMR LAB
BUN: 24 mg/dL — ABNORMAL HIGH (ref 6–23)
Basophils Absolute: 0 10*3/uL (ref 0.0–0.1)
Basophils Relative: 0 % (ref 0–1)
Calcium: 8.2 mg/dL — ABNORMAL LOW (ref 8.4–10.5)
Eosinophils Relative: 2 % (ref 0–5)
Lymphocytes Relative: 5 % — ABNORMAL LOW (ref 12–46)
Neutro Abs: 6 10*3/uL (ref 1.7–7.7)
Platelets: 133 10*3/uL — ABNORMAL LOW (ref 150–400)
Potassium: 4.7 meq/L (ref 3.5–5.3)
RDW: 16.9 % — ABNORMAL HIGH (ref 11.5–15.5)
Sodium: 135 meq/L (ref 135–145)

## 2008-05-18 ENCOUNTER — Encounter (INDEPENDENT_AMBULATORY_CARE_PROVIDER_SITE_OTHER): Payer: Self-pay | Admitting: Interventional Radiology

## 2008-05-19 ENCOUNTER — Encounter: Payer: Self-pay | Admitting: Internal Medicine

## 2008-05-23 ENCOUNTER — Telehealth (INDEPENDENT_AMBULATORY_CARE_PROVIDER_SITE_OTHER): Payer: Self-pay | Admitting: *Deleted

## 2008-05-24 ENCOUNTER — Encounter (HOSPITAL_COMMUNITY): Admission: RE | Admit: 2008-05-24 | Discharge: 2008-06-23 | Payer: Self-pay | Admitting: Oncology

## 2008-06-04 ENCOUNTER — Encounter: Payer: Self-pay | Admitting: Internal Medicine

## 2008-06-04 DIAGNOSIS — Z8719 Personal history of other diseases of the digestive system: Secondary | ICD-10-CM

## 2008-06-04 DIAGNOSIS — K7460 Unspecified cirrhosis of liver: Secondary | ICD-10-CM

## 2008-06-05 ENCOUNTER — Ambulatory Visit: Payer: Self-pay | Admitting: Internal Medicine

## 2008-06-06 ENCOUNTER — Telehealth: Payer: Self-pay | Admitting: Gastroenterology

## 2008-06-06 ENCOUNTER — Telehealth (INDEPENDENT_AMBULATORY_CARE_PROVIDER_SITE_OTHER): Payer: Self-pay

## 2008-06-06 ENCOUNTER — Encounter: Payer: Self-pay | Admitting: Gastroenterology

## 2008-06-11 ENCOUNTER — Ambulatory Visit (HOSPITAL_COMMUNITY): Payer: Self-pay | Admitting: Oncology

## 2008-06-24 ENCOUNTER — Ambulatory Visit (HOSPITAL_COMMUNITY): Admission: RE | Admit: 2008-06-24 | Discharge: 2008-06-24 | Payer: Self-pay | Admitting: Family Medicine

## 2008-06-26 ENCOUNTER — Telehealth: Payer: Self-pay | Admitting: Gastroenterology

## 2008-06-26 ENCOUNTER — Encounter (HOSPITAL_COMMUNITY): Admission: RE | Admit: 2008-06-26 | Discharge: 2008-07-26 | Payer: Self-pay | Admitting: Oncology

## 2008-07-16 ENCOUNTER — Encounter: Payer: Self-pay | Admitting: Internal Medicine

## 2008-08-02 ENCOUNTER — Telehealth (INDEPENDENT_AMBULATORY_CARE_PROVIDER_SITE_OTHER): Payer: Self-pay | Admitting: *Deleted

## 2008-08-07 ENCOUNTER — Telehealth (INDEPENDENT_AMBULATORY_CARE_PROVIDER_SITE_OTHER): Payer: Self-pay

## 2008-09-03 ENCOUNTER — Encounter (HOSPITAL_COMMUNITY): Admission: RE | Admit: 2008-09-03 | Discharge: 2008-10-02 | Payer: Self-pay | Admitting: Oncology

## 2008-09-03 ENCOUNTER — Ambulatory Visit (HOSPITAL_COMMUNITY): Payer: Self-pay | Admitting: Oncology

## 2008-09-18 ENCOUNTER — Encounter: Payer: Self-pay | Admitting: Internal Medicine

## 2008-09-25 ENCOUNTER — Encounter: Payer: Self-pay | Admitting: Internal Medicine

## 2008-10-29 ENCOUNTER — Encounter (HOSPITAL_COMMUNITY): Admission: RE | Admit: 2008-10-29 | Discharge: 2008-11-28 | Payer: Self-pay | Admitting: Oncology

## 2008-10-29 ENCOUNTER — Ambulatory Visit (HOSPITAL_COMMUNITY): Payer: Self-pay | Admitting: Oncology

## 2008-11-06 ENCOUNTER — Ambulatory Visit: Payer: Self-pay | Admitting: Internal Medicine

## 2008-11-07 ENCOUNTER — Encounter: Payer: Self-pay | Admitting: Internal Medicine

## 2008-11-11 ENCOUNTER — Encounter: Payer: Self-pay | Admitting: Internal Medicine

## 2008-11-15 ENCOUNTER — Encounter: Payer: Self-pay | Admitting: Internal Medicine

## 2008-11-18 ENCOUNTER — Ambulatory Visit: Payer: Self-pay | Admitting: Internal Medicine

## 2008-11-18 ENCOUNTER — Ambulatory Visit (HOSPITAL_COMMUNITY): Admission: RE | Admit: 2008-11-18 | Discharge: 2008-11-18 | Payer: Self-pay | Admitting: Internal Medicine

## 2008-11-20 ENCOUNTER — Telehealth (INDEPENDENT_AMBULATORY_CARE_PROVIDER_SITE_OTHER): Payer: Self-pay

## 2008-12-25 ENCOUNTER — Encounter (HOSPITAL_COMMUNITY): Admission: RE | Admit: 2008-12-25 | Discharge: 2009-01-01 | Payer: Self-pay | Admitting: Oncology

## 2008-12-25 ENCOUNTER — Ambulatory Visit (HOSPITAL_COMMUNITY): Payer: Self-pay | Admitting: Oncology

## 2009-01-04 HISTORY — PX: TIPS PROCEDURE: SHX808

## 2009-01-22 ENCOUNTER — Encounter (HOSPITAL_COMMUNITY): Admission: RE | Admit: 2009-01-22 | Discharge: 2009-02-21 | Payer: Self-pay | Admitting: Oncology

## 2009-02-10 ENCOUNTER — Encounter: Payer: Self-pay | Admitting: Internal Medicine

## 2009-02-13 ENCOUNTER — Encounter: Payer: Self-pay | Admitting: Internal Medicine

## 2009-02-25 ENCOUNTER — Encounter: Payer: Self-pay | Admitting: Urgent Care

## 2009-03-04 ENCOUNTER — Encounter (HOSPITAL_COMMUNITY): Admission: RE | Admit: 2009-03-04 | Discharge: 2009-04-03 | Payer: Self-pay | Admitting: Oncology

## 2009-03-04 ENCOUNTER — Ambulatory Visit (HOSPITAL_COMMUNITY): Payer: Self-pay | Admitting: Oncology

## 2009-04-15 ENCOUNTER — Encounter (HOSPITAL_COMMUNITY): Admission: RE | Admit: 2009-04-15 | Discharge: 2009-05-15 | Payer: Self-pay | Admitting: Oncology

## 2009-05-26 ENCOUNTER — Ambulatory Visit: Payer: Self-pay | Admitting: Internal Medicine

## 2009-05-26 DIAGNOSIS — K4090 Unilateral inguinal hernia, without obstruction or gangrene, not specified as recurrent: Secondary | ICD-10-CM | POA: Insufficient documentation

## 2009-05-26 DIAGNOSIS — K429 Umbilical hernia without obstruction or gangrene: Secondary | ICD-10-CM | POA: Insufficient documentation

## 2009-05-27 ENCOUNTER — Ambulatory Visit (HOSPITAL_COMMUNITY): Payer: Self-pay | Admitting: Oncology

## 2009-05-27 ENCOUNTER — Encounter (HOSPITAL_COMMUNITY): Admission: RE | Admit: 2009-05-27 | Discharge: 2009-06-26 | Payer: Self-pay | Admitting: Oncology

## 2009-05-28 ENCOUNTER — Encounter: Payer: Self-pay | Admitting: Internal Medicine

## 2009-06-06 ENCOUNTER — Telehealth (INDEPENDENT_AMBULATORY_CARE_PROVIDER_SITE_OTHER): Payer: Self-pay

## 2009-06-18 ENCOUNTER — Encounter: Payer: Self-pay | Admitting: Gastroenterology

## 2009-06-30 ENCOUNTER — Encounter: Payer: Self-pay | Admitting: Internal Medicine

## 2009-07-04 ENCOUNTER — Encounter: Payer: Self-pay | Admitting: Internal Medicine

## 2009-07-14 ENCOUNTER — Inpatient Hospital Stay (HOSPITAL_COMMUNITY): Admission: EM | Admit: 2009-07-14 | Discharge: 2009-07-18 | Payer: Self-pay | Admitting: Emergency Medicine

## 2009-07-14 ENCOUNTER — Encounter: Payer: Self-pay | Admitting: Internal Medicine

## 2009-07-14 ENCOUNTER — Ambulatory Visit: Payer: Self-pay | Admitting: Gastroenterology

## 2009-07-18 ENCOUNTER — Ambulatory Visit: Payer: Self-pay | Admitting: Gastroenterology

## 2009-08-21 ENCOUNTER — Encounter: Payer: Self-pay | Admitting: Gastroenterology

## 2009-09-02 ENCOUNTER — Ambulatory Visit: Payer: Self-pay | Admitting: Internal Medicine

## 2009-09-05 ENCOUNTER — Encounter: Payer: Self-pay | Admitting: Internal Medicine

## 2009-09-16 HISTORY — PX: TIPS PROCEDURE: SHX808

## 2009-09-19 ENCOUNTER — Encounter: Payer: Self-pay | Admitting: Internal Medicine

## 2009-10-02 ENCOUNTER — Ambulatory Visit (HOSPITAL_COMMUNITY): Payer: Self-pay | Admitting: Oncology

## 2009-10-02 ENCOUNTER — Encounter (HOSPITAL_COMMUNITY): Admission: RE | Admit: 2009-10-02 | Discharge: 2009-10-03 | Payer: Self-pay | Admitting: Oncology

## 2009-10-10 ENCOUNTER — Encounter: Payer: Self-pay | Admitting: Adult Health

## 2009-10-10 ENCOUNTER — Ambulatory Visit: Payer: Self-pay | Admitting: Cardiology

## 2009-10-15 ENCOUNTER — Encounter: Payer: Self-pay | Admitting: Internal Medicine

## 2009-10-17 LAB — CONVERTED CEMR LAB
BUN: 17 mg/dL (ref 6–23)
Chloride: 106 meq/L (ref 96–112)
Creatinine, Ser: 1.13 mg/dL (ref 0.40–1.50)
Glucose, Bld: 176 mg/dL — ABNORMAL HIGH (ref 70–99)

## 2009-10-18 ENCOUNTER — Ambulatory Visit: Payer: Self-pay | Admitting: Cardiology

## 2009-10-18 ENCOUNTER — Inpatient Hospital Stay (HOSPITAL_COMMUNITY)
Admission: EM | Admit: 2009-10-18 | Discharge: 2009-10-21 | Payer: Self-pay | Source: Home / Self Care | Admitting: Emergency Medicine

## 2009-10-20 ENCOUNTER — Encounter (INDEPENDENT_AMBULATORY_CARE_PROVIDER_SITE_OTHER): Payer: Self-pay | Admitting: Nephrology

## 2009-10-21 ENCOUNTER — Encounter (INDEPENDENT_AMBULATORY_CARE_PROVIDER_SITE_OTHER): Payer: Self-pay | Admitting: *Deleted

## 2009-10-31 ENCOUNTER — Encounter (HOSPITAL_COMMUNITY)
Admission: RE | Admit: 2009-10-31 | Discharge: 2009-11-30 | Payer: Self-pay | Source: Home / Self Care | Admitting: Oncology

## 2009-11-03 ENCOUNTER — Encounter: Payer: Self-pay | Admitting: Internal Medicine

## 2009-11-04 ENCOUNTER — Encounter: Payer: Self-pay | Admitting: Cardiology

## 2009-11-10 ENCOUNTER — Ambulatory Visit: Payer: Self-pay | Admitting: Urgent Care

## 2009-11-12 ENCOUNTER — Encounter: Payer: Self-pay | Admitting: Internal Medicine

## 2009-11-17 ENCOUNTER — Ambulatory Visit: Payer: Self-pay | Admitting: Internal Medicine

## 2009-12-01 ENCOUNTER — Ambulatory Visit (HOSPITAL_COMMUNITY): Admission: RE | Admit: 2009-12-01 | Discharge: 2009-12-01 | Payer: Self-pay | Admitting: Pulmonary Disease

## 2009-12-02 ENCOUNTER — Ambulatory Visit: Payer: Self-pay | Admitting: Internal Medicine

## 2009-12-02 ENCOUNTER — Ambulatory Visit (HOSPITAL_COMMUNITY): Admission: RE | Admit: 2009-12-02 | Discharge: 2009-12-02 | Payer: Self-pay | Admitting: Internal Medicine

## 2009-12-02 DIAGNOSIS — K449 Diaphragmatic hernia without obstruction or gangrene: Secondary | ICD-10-CM

## 2009-12-02 HISTORY — PX: ESOPHAGOGASTRODUODENOSCOPY: SHX1529

## 2009-12-02 HISTORY — DX: Diaphragmatic hernia without obstruction or gangrene: K44.9

## 2009-12-09 ENCOUNTER — Encounter (HOSPITAL_COMMUNITY)
Admission: RE | Admit: 2009-12-09 | Discharge: 2010-01-08 | Payer: Self-pay | Source: Home / Self Care | Attending: Oncology | Admitting: Oncology

## 2009-12-09 ENCOUNTER — Ambulatory Visit (HOSPITAL_COMMUNITY): Payer: Self-pay | Admitting: Oncology

## 2009-12-31 ENCOUNTER — Ambulatory Visit (HOSPITAL_COMMUNITY): Payer: Self-pay | Admitting: Oncology

## 2010-01-15 ENCOUNTER — Encounter: Payer: Self-pay | Admitting: Internal Medicine

## 2010-02-05 NOTE — Letter (Signed)
Summary: Internal Other/ [facility] labs/mri  Internal Other/ [facility] labs/[facility]ri   Imported By: Cloria Spring LPN 84/69/6295 28:41:32  _____________________________________________________________________  External Attachment:    Type:   Image     Comment:   External Document

## 2010-02-05 NOTE — Letter (Signed)
[facility]   Imported By: Rexene Alberts 11/12/2009 16:54:13  _____________________________________________________________________  External Attachment:    Type:   Image     Comment:   External Document  Appended Document: Orders Update    Clinical Lists Changes  Orders: Added new Service order of Est. Patient Level III ([address]) - Signed

## 2010-02-05 NOTE — Letter (Signed)
Summary: office note-consult from [facility]. barritt  office note-consult from [facility] barritt   Imported By: Rosine Beat 06/30/2009 10:56:10  _____________________________________________________________________  External Attachment:    Type:   Image     Comment:   External Document

## 2010-02-05 NOTE — Assessment & Plan Note (Signed)
Summary: FU,GU   Visit Type:  Follow-up Visit Primary Care Provider:  Cresenzo  Chief Complaint:  F/U   constipation now.  History of Present Illness: 65 y/o obese, diabetic, caucasian male w/ hx NASH cirrhosis here for FU.  BMs used to be 4-5 per day, pt concerned BM q 3-4 days.  c/o problems w/ hernia.  c/o thin stools.  Denies confusion.  No rectal bleeding or melena.  c/o groin pain. Recent Egd by Dr Rourk->Four columns of grade 2 esophageal varices in mid esophagus, status post banding as described above, a patent tubular esophagus through EG junction, subtle noncritical ring, small hiatal hernia, portal gastropathy.  On Nexium 40 mg orally daily. On propranolol.   H/o iron deficiency anemia.  Has FU scheduled 06/2009 at [facility] transplant clinic.  Plans for MRI/labs for [facility] screening per pt and notes from [facility].  c/o lots of pain/pressure at umbilical hernia and swelling & pain that affects his quality of life to left groin.  Current Problems (verified): 1)  Umbilical Hernia  (ICD-553.1) 2)  Inguinal Hernia  (ICD-550.90) 3)  Epigastric Pain  (ICD-789.06) 4)  Dysphagia Unspecified  (ICD-787.20) 5)  Sinusitis  (ICD-473.9) 6)  Clostridium Difficile Colitis, Hx of  (ICD-V12.79) 7)  Hx of Other Ascites  (ICD-789.59) 8)  Hx of CHF  (ICD-428.0) 9)  Crohn's Disease, Small Intestine  (ICD-555.0) 10)  Anemia of Chronic Disease  (ICD-285.29) 11)  Anasarca  (ICD-782.3) 12)  Esophageal Varices With Bleeding  (ICD-456.0) 13)  Cirrhosis of Liver Without Mention of Alcohol  (ICD-571.5) 14)  Pleural Effusion, Left  (ICD-511.9) 15)  Hyperlipidemia  (ICD-272.4) 16)  Dm  (ICD-250.00) 17)  Abnormal Heart Rhythms  (ICD-427.9)  Current Medications (verified): 1)  Oxycodone Hcl 5 Mg  Tabs (Oxycodone Hcl) .... Take 1 Tab By Mouth Every 4 Hours As Needed 2)  Zocor 40 Mg  Tabs (Simvastatin) .... Take 1 Tablet By Mouth Once A Day 3)  Propranolol Hcl 10 Mg Tabs (Propranolol Hcl) .... 1/2 Tablet Twice Daily 4)   Novolog 100 Unit/ml  Soln (Insulin Aspart) .... Use As Directed 5)  Lanoxin 0.25 Mg  Tabs (Digoxin) .... Take 1 Tablet By Mouth Once A Day 6)  Lantus 100 Unit/ml  Soln (Insulin Glargine) .... 70 Units Once Daily 7)  Aldactone 50 Mg Tabs (Spironolactone) .... 2 By Mouth Daily 8)  Furosemide 40 Mg Tabs (Furosemide) .... One Tablet Daily  Allergies (verified): 1)  ! Codeine 2)  ! * Tape  Review of Systems      See HPI General:  Complains of fatigue; denies fever, chills, sweats, anorexia, weakness, malaise, weight loss, and sleep disorder. CV:  Complains of peripheral edema; denies chest pains, angina, palpitations, syncope, dyspnea on exertion, orthopnea, PND, and claudication. Resp:  Denies dyspnea at rest, dyspnea with exercise, cough, sputum, wheezing, coughing up blood, and pleurisy. GI:  Denies difficulty swallowing, pain on swallowing, jaundice, bloody BM's, black BMs, and fecal incontinence. GU:  Denies urinary burning, blood in urine, urinary frequency, urinary hesitancy, nocturnal urination, and urinary incontinence. Derm:  Denies rash, itching, dry skin, hives, moles, warts, and unhealing ulcers. Psych:  Denies depression, anxiety, memory loss, suicidal ideation, hallucinations, paranoia, phobia, and confusion. Heme:  Complains of bruising; denies bleeding and enlarged lymph nodes.  Vital Signs:  Patient profile:   65 year old male Height:      73 inches Weight:      261 pounds BMI:     34.56 Temp:  98.4 degrees F oral Pulse rate:   84 / minute BP sitting:   132 / 70  (left arm) Cuff size:   regular  Vitals Entered By: Cloria Spring LPN (May 26, 2009 11:23 AM)  Physical Exam  General:  Well developed, well nourished, no acute distress. Head:  Normocephalic and atraumatic. Eyes:  Conjunctivae pink, no scleral icterus.  Mouth:  Oropharyngeal mucosa moist, pink.  No lesions, erythema or exudate.    Neck:  Supple; no masses or thyromegaly. Heart:  Regular rate and  rhythm; no murmurs, rubs,  or bruits. Abdomen:  Obese. Large, nontender easily reducible umb hernia.  No significant ascites.  Abd soft. without guarding and without rebound.   Genitalia:  Large painful L inguinal hernia without obvious strangulation.  Erythematous rash to bilat groin. Extremities:  No clubbing, cyanosis, edema or deformities noted.  No asterixis. Neurologic:  Alert and  oriented x4;  grossly normal neurologically. Skin:  Intact without significant lesions or rashes. Psych:  Alert and cooperative. Normal mood and affect.  Impression & Recommendations:  Problem # 1:  INGUINAL HERNIA (ICD-550.90) Large left inguinal hernia and umbilical hernia without complication at this point, but pt having worsening pain and enlargement in groin.  Although pt is high-risk surgical candidate, pt will need further evaluation at tertiary care surgical center.  Referral to [facility] general surgery per Dr Jena Gauss.  Pt agrees w/ plan. Orders: Est. Patient Level III ([address]295)  Problem # 2:  UMBILICAL HERNIA (ICD-553.1) See #1  Problem # 3:  CIRRHOSIS OF LIVER WITHOUT MENTION OF ALCOHOL (ICD-571.5) Hx NASH cirrhosis, due for surveillance screening.  Pt to have copy [facility] recors of MRI/AFP/LFTs faxed to our office.  Problem # 4:  ESOPHAGEAL VARICES WITH BLEEDING (ICD-456.0) Stable.  Next EGD in 18 months, unless problems arise.  Cont propranolol.  Problem # 5:  DM (ICD-250.00) Assessment: Comment Only  Problem # 6:  ANEMIA OF CHRONIC DISEASE (ICD-285.29) Assessment: Comment Only  Problem # 7:  CANDIDIASIS, SKIN (ICD-112.3) Nystatin/Triamcinolone as directed  Problem # 8:  CROHN'S DISEASE, SMALL INTESTINE (ICD-555.0) Stable Prescriptions: NYSTATIN-TRIAMCINOLONE 100000-0.1 UNIT/GM-% CREA (NYSTATIN-TRIAMCINOLONE) AAA two times a day x 2 weeks  #1 x 0   Entered and Authorized by:   Joselyn Arrow FNP-BC   Signed by:   Joselyn Arrow FNP-BC on 05/27/2009   Method used:   Electronically to         [facility] (retail)       [address]       [place], [place]  [address]       Ph: [phone]       Fax: [phone]   RxID:   [id number]   Appended Document: FU,GU Needs OV 6 months w/ RMR  Appended Document: FU,GU Referral Faxed to [facility].  Appended Document: FU,GU reminder in computer  Appended Document: FU,GU Please see if we can get copy MRI/labs from [facility] liver clinic.  Thanks  Appended Document: FU,GU Called and LMOM for the MRI and labs to be faxed.  Appended Document: FU,GU Received the above and scanned into EMR.  Appended Document: FU,GU Reviewed MRI.  Needs AFP.  Pt in [facility].

## 2010-02-05 NOTE — Assessment & Plan Note (Signed)
Summary: PAST DUE FOR F/U PER PT REQUEST/TG  Medications Added LANTUS 100 UNIT/ML  SOLN (INSULIN GLARGINE) 70 units once daily FUROSEMIDE 40 MG TABS (FUROSEMIDE) take 1 tablet by mouth once daily      Allergies Added:   Visit Type:  Follow-up Referring Kaelani Kendrick:  GI-Dr. Jena Gauss Primary Chen Saadeh:  Dr. Karleen Hampshire   History of Present Illness: [name] is a 65 y/o CM with complicated medical history to include GI Bleed with esophageal varicies, liver cirrhosis, chronic thrombocytopenia, recent TIPPS procedure at [facility] in September of 2011 and is followed by Dr. Rodrigo Ran, PAF, and AoV sclerosis.  He is not a coumadin candidate at this time. Since last visit one year ago his has had multiple admissions for GI issues and recent follow-up with Dr. Raford Pitcher who discontinued lasix and spironolactone post TIPP procedure. He has also had a stress test and echocardiogram for baseline information the setting pre-evaluation for liver transplant.  He has had an episode of rapid HR, atrial fibrillation on Sept 27th follow-up appointment, but the patient states that he was told not to take his usual medications prior to that visit. He was evaluated by his physician and was reminded to follow-up with cardiologist.  He states he could tell when his heart rate was racing and has since been back on his mediations with only occasional episodes of elevated HR which are transient.  He denies any dyspnea or syncope, he admits to mild epitaxis last evening, resolving on its own.    Carotid Doppler  Procedure date:  08/01/2006  Findings:      Minimal, if any, plaque No focal stenosis with normal waveforms  Cardiac Cath  Procedure date:  03/04/2004  Findings:      Performed [facility]   Moderate left ventricular enlargement with normal EF and mild MR A right dominant coronary system with no atherosclerosis Right ventricular pressure 66/4 PCW-25 Arterial saturation of 94% with  cardiac output of 7.7 L per minute  CT Scan  Procedure date:  03/04/2008  Findings:      CT Abdomen  Cirrhosis with splenomegaly and portal venous hypertension Her right portal vein thrombus Mild lymphadenopathy Slight decrease in ascites Moderate left pleural effusion with atelectasis Bilateral inguinal hernia Umbilicus hernia Appendix in place Osteopenia Fused sacroiliac joints  -  Date:  07/06/2007    TSH: 3.8  Date:  07/29/2006    Ferritin: 21    TIBC: 375    Iron: 47    Hemoccult 1: negative    HbA1c: 8.4   Preventive Screening-Counseling & Management  Alcohol-Tobacco     Alcohol drinks/day: 0     Smoking Status: quit  Current Medications (verified): 1)  Zocor 40 Mg  Tabs (Simvastatin) .... Take 1 Tablet By Mouth Once A Day 2)  Propranolol Hcl 10 Mg Tabs (Propranolol Hcl) .... Twice Daily 3)  Novolog 100 Unit/ml  Soln (Insulin Aspart) .... Use As Directed 4)  Lanoxin 0.25 Mg  Tabs (Digoxin) .... Take 1 Tablet By Mouth Once A Day 5)  Lantus 100 Unit/ml  Soln (Insulin Glargine) .... 70 Units Once Daily 6)  Furosemide 40 Mg Tabs (Furosemide) .... Take 1 Tablet By Mouth Once Daily  Allergies (verified): 1)  ! Codeine 2)  ! * Tape  Comments:  Nurse/Medical Assistant: patient didn't bring med bottles or list we reviewed ov from last ov and he has been took off of furosemide and spironolactone   Past History:  Past medical, surgical, family  and social histories (including risk factors) reviewed, and no changes noted (except as noted below).  Past Medical History: Hypertension-negative stress nuclear study in 02/2007; normal coronaries in 2006; Echo in 2006-aortic sclerosis,      Mild biatrial enlargement; normal LV Pulmonary hypertension-moderate at cath-2006 HYPERLIPIDEMIA Syncope-2008 AODM-insulin; onset in 1999; retinopathy, neuropathy and nephropathy Chronic atrial fibrillation-onset in 1995; anticoagulation discontinued in 2006 Chronic kidney  disease-stage III; creatinine of 1.6 in 8/07, 1.35 and 05/2008 Ulcerative colitis-lower GI bleed in 08/2002 Obesity Orthostatic dizziness  NASH: cirrhosis with esophageal varices; h/o GI bleeding. EGD-2010 grade 2 esophageal varices-banded with good hemostasis; portal gastropathy, friable gastric mucosa; small hiatal hernia.  EGD during recurrent melena one week later showed 2 eschars at previously placed bands, decompressed esophageal varices grade 1-2,  four columns, no bleeding stigmata; portal gastropathy, normal appearing small bowel. EGD 5/10, Grade II varices in the distal esophagus - s/p band ligation x 2, Portal Hypertensive gastropathy; Ascites 06/2009-TIPS procedure at [facility] 07/2009-admitted with recurrent upper GI bleed secondary to esophageal varices-banded x3  Left pleural effusion s/p thoracentesis Depression SB Crohns disease, Pentasa stopped by Dr. Brooke Dare at [facility] and pt has clinically be in remission. Anemia-Iron def requiring parenteral iron or injections in past, followed by Dr. Glenford Peers; B12 def Duodenal ulcer, 1980s Nephrolithiasis-cystoscopy with stone extraction in 1990 Last TCS approximately 2 years ago, [place], reportedly normal.    Consulting physicians: Cardiology-Dr. Dietrich Pates; GI-Dr. Jena Gauss; Orthopaedics-Dr. Jeanie Sewer; Hematology-Dr. Mariel Sleet; Hepatology-[facility]; Endocrine-Dr. 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 Father-history of fatal myocardial infarction-age 34 Mother-hypertension, diabetes; died at age 108 from chronic obstructive pulmonary disease and valvular heart disease Siblings: One brother with CHF; one brother with renal carcinoma; one sister with hypertension and diabetes  Social  History: Reviewed history from 02/22/2007 and no changes required. Tobacco-none Marital-divorced; one son Employment-retired and disabled  Alcohol drinks/day:  0 Smoking Status:  quit  Review of Systems       Rapid HR transiently  Vital Signs:  Patient profile:   65 year old male Weight:      249 pounds BMI:     32.97 Pulse rate:   70 / minute BP sitting:   120 / 60  (right arm)  Vitals Entered By: Dreama Saa, CNA (October 10, 2009 2:25 PM)  Physical Exam  General:  Well developed, well nourished, in no acute distress. Head:  normocephalic and atraumatic Eyes:  PERRLA/EOM intact; conjunctiva and lids normal. Lungs:  Diminished on the left without wheezes rales or rhonchi.  No pleural friction rubs. Heart:  Irregular rhythum with occaisional extra systole Abdomen:  umbilical hernia, soft. Msk:  Uses cane for ambulation Pulses:  pulses normal in all 4 extremities Extremities:  1+ left pedal edema and 1+ right pedal edema above the sock line Neurologic:  Alert and oriented x 3. Skin:  Pale without  bruises or evidence of bleeding. Psych:  Normal affect.   EKG  Procedure date:  10/10/2009  Findings:      Atrial fibrillation with a controlled ventricular response rate of: 67bpm.  Prolonged Q T interval 436 ms.  Impression & Recommendations:  Problem # 1:  ATRIAL FIBRILLATION (ICD-427.31) His rate is well controlled.  Will continue current medications without changes .  He has been seen and  examined by Dr. Dietrich Pates.  He will determine at a future appointment if he should be a coumadin candidate.  We have requested records from [facility] to have the report of the stress test and the echo. His updated medication list for this problem includes:    Propranolol Hcl 10 Mg Tabs (Propranolol hcl) .Marland Kitchen... Twice daily    Lanoxin 0.25 Mg Tabs (Digoxin) .Marland Kitchen... Take 1 tablet by mouth once a day  Problem # 2:  Hx of CHF (ICD-428.0) After review of medications, by myself and Dr.  Dietrich Pates,  it has been decided to restart Lasix 40mg  daily.  A follow-up BMET is ordered for one week.  The following medications were removed from the medication list:    Aldactone 50 Mg Tabs (Spironolactone) .Marland Kitchen... 2 by mouth daily    Furosemide 40 Mg Tabs (Furosemide) ..... One tablet daily His updated medication list for this problem includes:    Propranolol Hcl 10 Mg Tabs (Propranolol hcl) .Marland Kitchen... Twice daily    Lanoxin 0.25 Mg Tabs (Digoxin) .Marland Kitchen... Take 1 tablet by mouth once a day    Furosemide 40 Mg Tabs (Furosemide) .Marland Kitchen... Take 1 tablet by mouth once daily  Future Orders: T-Basic Metabolic Panel [id number]) ... 10/17/2009  Patient Instructions: 1)  Your physician recommends that you schedule a follow-up appointment in: 4 months 2)  Your physician has recommended you make the following change in your medication: Start taking Furosemide 40mg  by mouth once daily  3)  Your physician recommends that you return for lab work in: 1 week Prescriptions: FUROSEMIDE 40 MG TABS (FUROSEMIDE) take 1 tablet by mouth once daily  #30 x 4   Entered by:   Larita Fife Via LPN   Authorized by:   Kathlen Brunswick, MD, [facility]   Signed by:   Larita Fife Via LPN on 09/81/1914   Method used:   Electronically to        [facility] (retail)       [address] [place], [place]  [address]       Ph: [phone]       Fax: [phone]   RxID:   [id number]

## 2010-02-05 NOTE — Letter (Signed)
Summary: External Other/[facility]  office notes  External Other/[facility]  office notes   Imported By: Cloria Spring LPN 60/45/4098 11:91:47  _____________________________________________________________________  External Attachment:    Type:   Image     Comment:   External Document

## 2010-02-05 NOTE — Letter (Signed)
Summary: DETAILED CLINIC NOTE FROM [facility]  DETAILED CLINIC NOTE FROM [facility]   Imported By: Rexene Alberts 10/15/2009 12:01:40  _____________________________________________________________________  External Attachment:    Type:   Image     Comment:   External Document  Appended Document: Echocardiogram and stress nuclear study- [facility]    Clinical Lists Changes  Problems: Removed problem of EPIGASTRIC PAIN (ICD-789.06) Changed problem from DYSPHAGIA UNSPECIFIED (ICD-787.20) to DYSPHAGIA (ICD-787.20) Removed problem of SINUSITIS (ICD-473.9) Changed problem from History of  OTHER ASCITES (ICD-789.59) to ASCITES (ICD-789.59) Removed problem of History of  CHF (ICD-428.0) Removed problem of ANASARCA (ICD-782.3) Changed problem from DM (ICD-250.00) to DIABETES MELLITUS, TYPE II (ICD-250.00) Removed problem of ABNORMAL HEART RHYTHMS (ICD-427.9) Removed problem of CANDIDIASIS, SKIN (ICD-112.3) Observations: Added new observation of CARDIO HPI: Additional records from [facility] obtained and entered:  Echocardiogram-mildly enlarged LV with high normal EF; moderaate LAE; mild to moderate pulmonary htn.  Pharmacologic Stress Nuclear: EF-60%; moderate RVE; mildly depressed RV function; normal perfusion. (10/31/2009 15:06) Added new observation of REFERRING MD: GI-Dr. Jena Gauss (10/31/2009 15:06) Added new observation of PRIMARY MD: Dr. Karleen Hampshire (10/31/2009 15:06) Added new observation of ECHOINTERP: Study performed at [facility]  Left ventricle-mild dilatation; no LVH; normal to hyperdynamic function with an EF of 65-70% Elevated LVEDP Left atrium-moderately dilated Aortic valve-mildly thickened Ascending aorta-normal Right ventricle-normal size and function Right atrium-mildly dilated Suboptimal assessment of RV pressure-probably mildly to moderately elevated. (10/06/2009 17:53) Added new observation of NUCST CONC: Pharmacologic study performed at [facility], mild, subtle fixed anteroseptal  defect-probable right ventricular insertion artifact  Mild RVH; moderate RV enlargement; mildly impaired RV function Normal left ventricular size and function with an EF of 60%  No change compared with a previous study performed 09/12/2008. (09/29/2009 17:57)       Referring Provider:  GI-Dr. Jena Gauss Primary Provider:  Dr. Karleen Hampshire   History of Present Illness: Additional records from [facility] obtained and entered:  Echocardiogram-mildly enlarged LV with high normal EF; moderaate LAE; mild to moderate pulmonary htn.  Pharmacologic Stress Nuclear: EF-60%; moderate RVE; mildly depressed RV function; normal perfusion.  Echocardiogram  Procedure date:  10/06/2009  Findings:      Study performed at [facility]  Left ventricle-mild dilatation; no LVH; normal to hyperdynamic function with an EF of 65-70% Elevated LVEDP Left atrium-moderately dilated Aortic valve-mildly thickened Ascending aorta-normal Right ventricle-normal size and function Right atrium-mildly dilated Suboptimal assessment of RV pressure-probably mildly to moderately elevated.  Nuclear ETT  Procedure date:  09/29/2009  Findings:      Pharmacologic study performed at [facility], mild, subtle fixed anteroseptal defect-probable right ventricular insertion artifact  Mild RVH; moderate RV enlargement; mildly impaired RV function Normal left ventricular size and function with an EF of 60%  No change compared with a previous study performed 09/12/2008.

## 2010-02-05 NOTE — Letter (Signed)
Summary: DISCHARGE SUMMARY FROM [facility]  DISCHARGE SUMMARY FROM [facility]   Imported By: Rexene Alberts 09/19/2009 15:20:42  _____________________________________________________________________  External Attachment:    Type:   Image     Comment:   External Document

## 2010-02-05 NOTE — Assessment & Plan Note (Signed)
Summary: C/O OF HAVING ALOT OF DIARRHEA/LAW   Visit Type:  Follow-up Visit Primary Care Provider:  McGough  Chief Complaint:  diarrhea.  History of Present Illness: Office followup Nash/cirrhosis; recent hospitalization for upper GI bleed secondary to varices requiring banding. Patient is in the process of being evaluated to get his ventral hernia repaired at [facility]. He tells me he saw the folks down there a week and a half ago;  plans are being made for TIPS on September 13th.  He will be seeing the general surgeon afterwards for hernia repair. He did have significant nonbloody watery diarrhea after he was discharged from the hosptal here recently.Marland Kitchen He called no one. He states his bowel function is back down to his baseline having 4 semi-formed stools daily; Has not had a fever or chills. He did have a history of severe C. difficile infection when he was hospitalized for his spine fracture previously. He is being watched closely by the physicians at [facility]. He is to have a battery of laboratory testing in 2 weeks; he hasn't had any melena or hematochezia .no hematemesis. .  Current Medications (verified): 1)  Zocor 40 Mg  Tabs (Simvastatin) .... Take 1 Tablet By Mouth Once A Day 2)  Propranolol Hcl 10 Mg Tabs (Propranolol Hcl) .... Twice Daily 3)  Novolog 100 Unit/ml  Soln (Insulin Aspart) .... Use As Directed 4)  Lanoxin 0.25 Mg  Tabs (Digoxin) .... Take 1 Tablet By Mouth Once A Day 5)  Lantus 100 Unit/ml  Soln (Insulin Glargine) .... 50 Units Once Daily 6)  Aldactone 50 Mg Tabs (Spironolactone) .... 2 By Mouth Daily 7)  Furosemide 40 Mg Tabs (Furosemide) .... One Tablet Daily  Allergies (verified): 1)  ! Codeine 2)  ! * Tape  Past History:  Past Medical History: Last updated: 11/06/2008  1) HYPERLIPIDEMIA (ICD-272.4) 2) DM (ICD-250.00 3) atrial fibrillation, not on coumadin 4) NASH cirrhosis with esophageal varices and history of GI bleeding. EGD January 15, 2008 he had  4 columns of grade 2 esophageal varices. 2 of these columns were banded with good hemostasis. 2 columns of grade 2 varices were not manipulated. He has portal gastropathy, marked friability of the gastric mucosa as well as a small hiatal hernia.  EGD one week later when he presented with recurrent melena showed two eschars distal esophagus overlying at the location of previously placed bands, decompressed esophageal varices grade 1-2,  four columns, no bleeding stigmata. Diffuse gastric mucosal changes consistent with portal gastropathy, no free bleeding lesions seen, diffusely friable gastric mucosa, patent pylorus, friable bulbar mucosa, otherwise D1 and D2 appeared normal.  CTE done and showed no normal appearing small bowel. Last EGD by Dr. Yancey Flemings, 5/10, Grade II varices in the distal esophagus - s/p band ligation x 2, Portal Hypertensive gastropathy in the total stomach. 5) H/O left pleural effusion s/p thoracentesis X 1 6) H/O SB Crohns disease, Pentasa stopped by Dr. Brooke Dare at [facility] and pt has clinically be in remission. 7) H/O iron def requiring parenteral iron or injections in past, followed by Dr. Glenford Peers 8) H/O B12 def 9) H/O duodenal ulcer, remote 10) Last TCS approximately 2 years ago, [place], reportedly normal.         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  Social History: Last updated: 02/22/2007 Patient never smoked.  pt is divorced. pt is retired. pt has children.  Risk Factors: Smoking Status: never (02/22/2007)  Vital Signs:  Patient profile:   65 year old male Height:      73 inches Weight:      258 pounds BMI:     34.16 Temp:     97.8 degrees F oral Pulse rate:   64 /  minute BP sitting:   128 / 68  (left arm) Cuff size:   regular  Vitals Entered By: Cloria Spring LPN (September 02, 2009 1:52 PM)  Physical Exam  General:  all were conversant will oriented in no acute distress Lungs:  clear to auscultation Heart:  regular rate rhythm without murmur gallop rub Abdomen:  somewhat obese but it he does not have tense ascites again he has a ventral hernia easily reducible the abdomen is entirely nontender    Impression & Recommendations: Impression: Complicated but very nice 65 year old gentleman with Nash/ cirrhosis as well small bowel Crohn's disease - doing the very well at this point in time.  Recent upper GI bleed secondary to varices. He is undergoing a TIPS procedure next in preparation for an abdominal wall hernia repair. He has some protracted diarrhea after hospitalization which may or may not be related to self limiting C. difficile infection. He is back to his "Crohn's baseline".  Recommendations: No change in his medical regimen at this time.  I've asked him to make sure that he gets me copies of lab work done at [facility] next month.  The original plan was to bring him back in October to repeat his EGD and reassess his varices. If he gets gets a TIPS next month, we should see significant decompression of his esophageal varices. Plan for EGD October 2011.  Appended Document: Orders Update    Clinical Lists Changes  Orders: Added new Service order of Est. Patient Level IV ([address]) - Signed

## 2010-02-05 NOTE — Letter (Signed)
Summary: [place] [facility], [facility] at [facility]  [address], [place] [address]   Phone: [phone]  Fax: [phone]      October 21, 2009 MRN: [id number]   [name] [address] [place], [place]  [address]   Dear Mr. CUMPTON,  Your test ordered by Selena Batten has been reviewed by your physician (or physician assistant) and was found to be normal or stable. Your physician (or physician assistant) felt no changes were needed at this time.  ____ Echocardiogram  ____ Cardiac Stress Test  _x___ Lab Work  ____ Peripheral vascular study of arms, legs or neck  ____ CT scan or X-ray  ____ Lung or Breathing test  ____ Other: No change in medical treatment at this time, per Dr. Dietrich Pates.  Enclosed is a copy of your labwork for your records.  Thank you, Tammy Allyne Gee RN    [place] Bing, MD, Lenise Arena.C.Gaylord Shih, MD, F.A.C.C Lewayne Bunting, MD, F.A.C.C Nona Dell, MD, F.A.C.C Charlton Haws, MD, Lenise Arena.C.C

## 2010-02-05 NOTE — Medication Information (Signed)
Summary: Tax adviser   Imported By: Diana Eves 06/18/2009 16:56:39  _____________________________________________________________________  External Attachment:    Type:   Image     Comment:   External Document  Appended Document: RX Folder-aldactone    Prescriptions: ALDACTONE 50 MG TABS (SPIRONOLACTONE) 2 by mouth daily  #60 x 5   Entered and Authorized by:   Leanna Battles. Dixon Boos   Signed by:   Leanna Battles Dixon Boos on 06/19/2009   Method used:   Electronically to        [facility] (retail)       [address]       [place], [place]  [address]       Ph: [phone]       Fax: [phone]   RxID:   [id number]

## 2010-02-05 NOTE — Letter (Signed)
Summary: [facility]  [facility]   Imported By: Rexene Alberts 01/15/2010 09:19:55  _____________________________________________________________________  External Attachment:    Type:   Image     Comment:   External Document

## 2010-02-05 NOTE — Letter (Signed)
Summary: CONSULTATION  CONSULTATION   Imported By: Rexene Alberts 08/21/2009 16:04:29  _____________________________________________________________________  External Attachment:    Type:   Image     Comment:   External Document

## 2010-02-05 NOTE — Letter (Signed)
Summary: ECHO 10/06/09  ECHO 10/06/09   Imported By: Faythe Ghee 11/04/2009 15:38:26  _____________________________________________________________________  External Attachment:    Type:   Image     Comment:   External Document

## 2010-02-05 NOTE — Assessment & Plan Note (Signed)
Summary: CONSULT TIME FOR EGD/LAW   Visit Type:  Follow-up Visit Primary Care Provider:  Dr. Karleen Hampshire  CC:  Consult for EGD.  History of Present Illness: [name] is a pleasant 65 year old male with a hx of Crohn's as well as NASH/cirrhosis. He is being followed closely at [facility] by transplant team. He was last evaluated by them Oct 3 and found to have no absolute contraindication for transplantation; however, he has poor glucose control and a low MELD score. He continues to be followed, and his next appt there is in Jan. Last AFT drawn 07/16/09: <1.3, MRI June 2011: no evidence of HCC. He will have repeat scan and AFP in Jan at [facility]. 09/16/09: underwent TIPS. No ventral hernia/inguinal hernia repair to be pursued unless complications per pt. has had f/u with general surgeon regarding this repair. hx of multiple banding for varices, at least 6 times since 2006, most recently July 2011. Will need EGD to assess esophageal varices this month. Doing well today, BM 2-4 times daily, no melena or hematochezia. No abdominal pain. No lower extremity edema, no ascites noted. . fatigued, otherwise doing well, followed for IDA by cancer center.  Wt 245 today, was 249 October.   Current Medications (verified): 1)  Zocor 40 Mg  Tabs (Simvastatin) .... Take 1 Tablet By Mouth Once A Day 2)  Propranolol Hcl 10 Mg Tabs (Propranolol Hcl) .... Twice Daily 3)  Novolog 100 Unit/ml  Soln (Insulin Aspart) .... Use As Directed 4)  Lanoxin 0.25 Mg  Tabs (Digoxin) .... Take 1 Tablet By Mouth Once A Day 5)  Lantus 100 Unit/ml  Soln (Insulin Glargine) .... 70 Units Once Daily 6)  Furosemide 40 Mg Tabs (Furosemide) .... Twake Two Tablets Daily  Allergies (verified): 1)  ! Codeine 2)  ! * Tape  Past History:  Past Medical History: Last updated: 10/10/2009 Hypertension-negative stress nuclear study in 02/2007; normal coronaries in 2006; Echo in 2006-aortic sclerosis,      Mild biatrial enlargement;  normal LV Pulmonary hypertension-moderate at cath-2006 HYPERLIPIDEMIA Syncope-2008 AODM-insulin; onset in 1999; retinopathy, neuropathy and nephropathy Chronic atrial fibrillation-onset in 1995; anticoagulation discontinued in 2006 Chronic kidney disease-stage III; creatinine of 1.6 in 8/07, 1.35 and 05/2008 Ulcerative colitis-lower GI bleed in 08/2002 Obesity Orthostatic dizziness  NASH: cirrhosis with esophageal varices; h/o GI bleeding. EGD-2010 grade 2 esophageal varices-banded with good hemostasis; portal gastropathy, friable gastric mucosa; small hiatal hernia.  EGD during recurrent melena one week later showed 2 eschars at previously placed bands, decompressed esophageal varices grade 1-2,  four columns, no bleeding stigmata; portal gastropathy, normal appearing small bowel. EGD 5/10, Grade II varices in the distal esophagus - s/p band ligation x 2, Portal Hypertensive gastropathy; Ascites 06/2009-TIPS procedure at [facility] 07/2009-admitted with recurrent upper GI bleed secondary to esophageal varices-banded x3  Left pleural effusion s/p thoracentesis Depression SB Crohns disease, Pentasa stopped by Dr. Brooke Dare at [facility] and pt has clinically be in remission. Anemia-Iron def requiring parenteral iron or injections in past, followed by Dr. Glenford Peers; B12 def Duodenal ulcer, 1980s Nephrolithiasis-cystoscopy with stone extraction in 1990 Last TCS approximately 2 years ago, [place], reportedly normal.    Consulting physicians: Cardiology-Dr. Dietrich Pates; GI-Dr. Jena Gauss; Orthopaedics-Dr. Jeanie Sewer; Hematology-Dr. Mariel Sleet; Hepatology-[facility]; Endocrine-Dr. 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 Eyes:  Denies blurring,  irritation, and discharge. ENT:  Denies sore throat, hoarseness, and difficulty swallowing. CV:  Denies chest pains and dyspnea on exertion. Resp:  Denies dyspnea at rest, cough, and wheezing. GI:  Denies difficulty swallowing, pain on swallowing, nausea, indigestion/heartburn, abdominal pain, jaundice, constipation, change in bowel habits, bloody BM's, and black BMs. GU:  Denies urinary burning, blood in urine, and urinary frequency. MS:  Denies joint pain / LOM, joint stiffness, and joint deformity. Derm:  Denies rash, itching, and dry skin. Neuro:  Denies weakness and paralysis. Psych:  Denies depression and anxiety. Endo:  Denies cold intolerance and heat intolerance.  Vital Signs:  Patient profile:   65 year old male Height:      73 inches Weight:      245 pounds BMI:     32.44 Temp:     98.5 degrees F oral Pulse rate:   68 / minute BP sitting:   118 / 60  (left arm) Cuff size:   regular  Vitals Entered By: Cloria Spring LPN (November 17, 2009 1:36 PM)  Physical Exam  General:  Well developed, well nourished, no acute distress. Head:  Normocephalic and atraumatic. Eyes:  without scleral icterus Lungs:  Clear throughout to auscultation. Heart:  Regular rate and rhythm; no murmurs, rubs,  or bruits. Abdomen:  normal bowel sounds, obese, without guarding, without rebound, umbilical hernia, and no distesion.  without evidence of ascites Msk:  Symmetrical with no gross deformities. Normal posture. Extremities:  No clubbing, cyanosis, edema or deformities noted. Neurologic:  alert and oriented X 4, no asterixis, no confusion Psych:  Alert and cooperative. Normal mood and affect.  Impression & Recommendations:  Problem # 1:  CIRRHOSIS OF LIVER WITHOUT MENTION OF ALCOHOL (ICD-34.20) 65 year old male followed by [facility] Clinically stable at this time, no evidence of ascites, edema, mental status changes. Due for AFP, MRI to assess for [facility] in January. Will be done at [facility]. TIPS 09/16/09.   Continue current plan of care  Problem # 2:  ESOPHAGEAL VARICES WITH BLEEDING (ICD-456.0) s/p TIPS Sept 2011, last EGD with banding July 2011. No evidence of melena, hematochezia, needs reassessment of varices.  Schedule for EGD with Dr. Jena Gauss. The risks and benefits have been discussed with pt, and he desires to proceed. Verbal consent obtained.  Problem # 3:  CROHN'S DISEASE, SMALL INTESTINE (ICD-555.0) at baseline, 2-4 BMs per day, no abdominal pain. continue current plan of care.  Appended Document: Orders Update    Clinical Lists Changes  Orders: Added new Service order of Est. Patient Level III ([address]) - Signed

## 2010-02-05 NOTE — Medication Information (Signed)
Summary: Tax adviser   Imported By: Diana Eves 02/25/2009 14:07:48  _____________________________________________________________________  External Attachment:    Type:   Image     Comment:   External Document  Appended Document: RX FolderSpironolactone Pt needs OV in 3-4 months.  Prescriptions: ALDACTONE 50 MG TABS (SPIRONOLACTONE) 2 by mouth daily  #62 x 2   Entered and Authorized by:   Joselyn Arrow FNP-BC   Signed by:   Joselyn Arrow FNP-BC on 02/25/2009   Method used:   Electronically to        [facility] (retail)       [address]       [place], [place]  [address]       Ph: [phone]       Fax: [phone]   RxID:   [id number]     Appended Document: RX Folder lmom for pt to call back to set up appt.

## 2010-02-05 NOTE — Letter (Signed)
Summary: CLINIC NOTES FROM [facility] NOTES FROM [facility]   Imported By: Rexene Alberts 11/03/2009 08:59:03  _____________________________________________________________________  External Attachment:    Type:   Image     Comment:   External Document

## 2010-02-05 NOTE — Letter (Signed)
Summary: [facility] REFERRAL  [facility] REFERRAL   Imported By: Ave Filter 05/28/2009 10:40:04  _____________________________________________________________________  External Attachment:    Type:   Image     Comment:   External Document

## 2010-02-05 NOTE — Letter (Signed)
Summary: OFFICE NOTE FROM [facility]  OFFICE NOTE FROM [facility]   Imported By: Rexene Alberts 01/15/2010 56:38:75  _____________________________________________________________________  External Attachment:    Type:   Image     Comment:   External Document

## 2010-02-05 NOTE — Progress Notes (Signed)
Summary: [facility]  Phone Note Call from Patient Call back at Home Phone [phone]   Caller: Patient Summary of Call: pt called- stated he went to [facility] and Cristal Deer [phone]) the liver transplant coordinator and Dr.Barrett told him he was not able to have surgery. He said if [facility] needed more info he would need to call them. Initial call taken by: Hendricks Limes LPN,  June 07, 9526 10:43 AM     Appended Document: [facility] noted  Appended Document: [facility] Has pt had HCC screening (MRI or [place], AFP) yet?  If so, let's get results please.  Thanks  Appended Document: [facility] LMOM to call.  Appended Document: [facility] Pt called and said he is scheduled to go to [facility] on Monday for MRI and CXR. He is not sure if there will be anything else done. He will make sure they fax results to [place]. He said the Liver Coordinator @ [facility] told him absolutely no surgery, especially on his lower body (reference to hernia).

## 2010-02-05 NOTE — Letter (Signed)
Summary: [facility] notes [facility]  office notes [facility]   Imported By: Ricard Dillon 02/10/2009 15:01:13  _____________________________________________________________________  External Attachment:    Type:   Image     Comment:   External Document

## 2010-02-05 NOTE — Letter (Signed)
Summary: CONSULT NOTE FROM [facility]  CONSULT NOTE FROM [facility]   Imported By: Rexene Alberts 09/05/2009 11:36:54  _____________________________________________________________________  External Attachment:    Type:   Image     Comment:   External Document

## 2010-02-05 NOTE — Letter (Signed)
Summary: [facility]  [facility]   Imported By: Diana Eves 02/13/2009 11:45:15  _____________________________________________________________________  External Attachment:    Type:   Image     Comment:   External Document

## 2010-02-05 NOTE — Letter (Signed)
Summary: EGD ORDER  EGD ORDER   Imported By: Rexene Alberts 11/17/2009 14:40:06  _____________________________________________________________________  External Attachment:    Type:   Image     Comment:   External Document

## 2010-02-25 ENCOUNTER — Encounter (HOSPITAL_COMMUNITY): Payer: Medicare Other | Attending: Oncology

## 2010-02-25 ENCOUNTER — Other Ambulatory Visit (HOSPITAL_COMMUNITY): Payer: Medicare Other

## 2010-02-25 ENCOUNTER — Other Ambulatory Visit (HOSPITAL_COMMUNITY): Payer: Self-pay | Admitting: Oncology

## 2010-02-25 DIAGNOSIS — E119 Type 2 diabetes mellitus without complications: Secondary | ICD-10-CM | POA: Insufficient documentation

## 2010-02-25 DIAGNOSIS — D649 Anemia, unspecified: Secondary | ICD-10-CM | POA: Insufficient documentation

## 2010-02-25 DIAGNOSIS — K7460 Unspecified cirrhosis of liver: Secondary | ICD-10-CM | POA: Insufficient documentation

## 2010-02-25 DIAGNOSIS — D509 Iron deficiency anemia, unspecified: Secondary | ICD-10-CM

## 2010-02-25 DIAGNOSIS — Z79899 Other long term (current) drug therapy: Secondary | ICD-10-CM | POA: Insufficient documentation

## 2010-03-06 LAB — CBC
MCH: 30.5 pg (ref 26.0–34.0)
MCHC: 33.9 g/dL (ref 30.0–36.0)
MCV: 90 fL (ref 78.0–100.0)
Platelets: 139 10*3/uL — ABNORMAL LOW (ref 150–400)
RBC: 3.51 MIL/uL — ABNORMAL LOW (ref 4.22–5.81)
RDW: 14.5 % (ref 11.5–15.5)

## 2010-03-06 LAB — FERRITIN: Ferritin: 258 ng/mL (ref 22–322)

## 2010-03-16 LAB — CBC
Hemoglobin: 11.3 g/dL — ABNORMAL LOW (ref 13.0–17.0)
MCH: 28.4 pg (ref 26.0–34.0)
MCH: 28.3 pg (ref 26.0–34.0)
MCHC: 33.3 g/dL (ref 30.0–36.0)
MCHC: 33.7 g/dL (ref 30.0–36.0)
Platelets: 114 10*3/uL — ABNORMAL LOW (ref 150–400)
RDW: 17.7 % — ABNORMAL HIGH (ref 11.5–15.5)
RDW: 19.3 % — ABNORMAL HIGH (ref 11.5–15.5)
WBC: 8.3 10*3/uL (ref 4.0–10.5)

## 2010-03-16 LAB — FERRITIN: Ferritin: 201 ng/mL (ref 22–322)

## 2010-03-17 LAB — GLUCOSE, CAPILLARY: Glucose-Capillary: 174 mg/dL — ABNORMAL HIGH (ref 70–99)

## 2010-03-18 LAB — BASIC METABOLIC PANEL
BUN: 16 mg/dL (ref 6–23)
BUN: 17 mg/dL (ref 6–23)
CO2: 23 mEq/L (ref 19–32)
CO2: 24 mEq/L (ref 19–32)
Calcium: 8 mg/dL — ABNORMAL LOW (ref 8.4–10.5)
Calcium: 8.2 mg/dL — ABNORMAL LOW (ref 8.4–10.5)
Calcium: 8.3 mg/dL — ABNORMAL LOW (ref 8.4–10.5)
Chloride: 105 mEq/L (ref 96–112)
Chloride: 106 mEq/L (ref 96–112)
Creatinine, Ser: 1.29 mg/dL (ref 0.4–1.5)
Creatinine, Ser: 1.32 mg/dL (ref 0.4–1.5)
GFR calc Af Amer: >60 mL/min (ref >60)
GFR calc Af Amer: >60 mL/min (ref >60)
GFR calc Af Amer: >60 mL/min (ref >60)
GFR calc non Af Amer: 53 mL/min — ABNORMAL LOW (ref >60)
GFR calc non Af Amer: 55 mL/min — ABNORMAL LOW (ref >60)
GFR calc non Af Amer: 56 mL/min — ABNORMAL LOW (ref >60)
Glucose, Bld: 117 mg/dL — ABNORMAL HIGH (ref 70–99)
Glucose, Bld: 137 mg/dL — ABNORMAL HIGH (ref 70–99)
Glucose, Bld: 162 mg/dL — ABNORMAL HIGH (ref 70–99)
Potassium: 3.9 mEq/L (ref 3.5–5.1)
Potassium: 4.1 mEq/L (ref 3.5–5.1)
Sodium: 135 mEq/L (ref 135–145)
Sodium: 135 mEq/L (ref 135–145)
Sodium: 135 mEq/L (ref 135–145)

## 2010-03-18 LAB — CROSSMATCH
Antibody Screen: NEGATIVE
Unit division: 0

## 2010-03-18 LAB — DIFFERENTIAL
Basophils Absolute: 0 10*3/uL (ref 0.0–0.1)
Basophils Absolute: 0 10*3/uL (ref 0.0–0.1)
Basophils Absolute: 0 10*3/uL (ref 0.0–0.1)
Basophils Relative: 0 % (ref 0–1)
Basophils Relative: 0 % (ref 0–1)
Basophils Relative: 1 % (ref 0–1)
Eosinophils Absolute: 0.1 10*3/uL (ref 0.0–0.7)
Eosinophils Relative: 2 % (ref 0–5)
Lymphocytes Relative: 5 % — ABNORMAL LOW (ref 12–46)
Lymphocytes Relative: 6 % — ABNORMAL LOW (ref 12–46)
Lymphs Abs: 0.4 10*3/uL — ABNORMAL LOW (ref 0.7–4.0)
Monocytes Absolute: 0.5 10*3/uL (ref 0.1–1.0)
Monocytes Absolute: 0.7 10*3/uL (ref 0.1–1.0)
Monocytes Relative: 12 % (ref 3–12)
Monocytes Relative: 14 % — ABNORMAL HIGH (ref 3–12)
Neutro Abs: 3.3 10*3/uL (ref 1.7–7.7)
Neutro Abs: 4.1 10*3/uL (ref 1.7–7.7)
Neutro Abs: 4.6 10*3/uL (ref 1.7–7.7)
Neutrophils Relative %: 75 % (ref 43–77)
Neutrophils Relative %: 80 % — ABNORMAL HIGH (ref 43–77)
Neutrophils Relative %: 83 % — ABNORMAL HIGH (ref 43–77)

## 2010-03-18 LAB — GLUCOSE, CAPILLARY
Glucose-Capillary: 118 mg/dL — ABNORMAL HIGH (ref 70–99)
Glucose-Capillary: 233 mg/dL — ABNORMAL HIGH (ref 70–99)
Glucose-Capillary: 244 mg/dL — ABNORMAL HIGH (ref 70–99)
Glucose-Capillary: 271 mg/dL — ABNORMAL HIGH (ref 70–99)
Glucose-Capillary: 274 mg/dL — ABNORMAL HIGH (ref 70–99)
Glucose-Capillary: 74 mg/dL (ref 70–99)

## 2010-03-18 LAB — COMPREHENSIVE METABOLIC PANEL
Albumin: 2.6 g/dL — ABNORMAL LOW (ref 3.5–5.2)
Alkaline Phosphatase: 143 U/L — ABNORMAL HIGH (ref 39–117)
BUN: 15 mg/dL (ref 6–23)
Chloride: 103 mEq/L (ref 96–112)
Creatinine, Ser: 1.25 mg/dL (ref 0.4–1.5)
Glucose, Bld: 164 mg/dL — ABNORMAL HIGH (ref 70–99)
Potassium: 4.3 mEq/L (ref 3.5–5.1)
Total Bilirubin: 1.2 mg/dL (ref 0.3–1.2)
Total Protein: 7.5 g/dL (ref 6.0–8.3)

## 2010-03-18 LAB — CARDIAC PANEL(CRET KIN+CKTOT+MB+TROPI)
CK, MB: 2.3 ng/mL (ref 0.3–4.0)
CK, MB: 2.5 ng/mL (ref 0.3–4.0)
CK, MB: 2.7 ng/mL (ref 0.3–4.0)
Relative Index: INVALID (ref 0.0–2.5)
Relative Index: INVALID (ref 0.0–2.5)
Relative Index: INVALID (ref 0.0–2.5)
Total CK: 53 U/L (ref 7–232)
Total CK: 60 U/L (ref 7–232)
Total CK: 73 U/L (ref 7–232)
Troponin I: 0.01 ng/mL (ref 0.00–0.06)
Troponin I: 0.02 ng/mL (ref 0.00–0.06)
Troponin I: 0.02 ng/mL (ref 0.00–0.06)

## 2010-03-18 LAB — CBC
HCT: 26.2 % — ABNORMAL LOW (ref 39.0–52.0)
HCT: 26.5 % — ABNORMAL LOW (ref 39.0–52.0)
HCT: 27.8 % — ABNORMAL LOW (ref 39.0–52.0)
Hemoglobin: 7.9 g/dL — ABNORMAL LOW (ref 13.0–17.0)
Hemoglobin: 8.6 g/dL — ABNORMAL LOW (ref 13.0–17.0)
MCH: 25.8 pg — ABNORMAL LOW (ref 26.0–34.0)
MCH: 26.5 pg (ref 26.0–34.0)
MCHC: 32.4 g/dL (ref 30.0–36.0)
MCHC: 33 g/dL (ref 30.0–36.0)
MCHC: 33 g/dL (ref 30.0–36.0)
MCHC: 33.3 g/dL (ref 30.0–36.0)
MCV: 79.5 fL (ref 78.0–100.0)
MCV: 80.2 fL (ref 78.0–100.0)
Platelets: 105 10*3/uL — ABNORMAL LOW (ref 150–400)
Platelets: 91 10*3/uL — ABNORMAL LOW (ref 150–400)
Platelets: 99 10*3/uL — ABNORMAL LOW (ref 150–400)
RBC: 3.26 MIL/uL — ABNORMAL LOW (ref 4.22–5.81)
RDW: 19 % — ABNORMAL HIGH (ref 11.5–15.5)
RDW: 19.8 % — ABNORMAL HIGH (ref 11.5–15.5)
RDW: 20.3 % — ABNORMAL HIGH (ref 11.5–15.5)
WBC: 4.9 10*3/uL (ref 4.0–10.5)
WBC: 5.8 10*3/uL (ref 4.0–10.5)

## 2010-03-18 LAB — CK TOTAL AND CKMB (NOT AT ARMC)
CK, MB: 2.3 ng/mL (ref 0.3–4.0)
Total CK: 54 U/L (ref 7–232)

## 2010-03-18 LAB — HEMOGLOBIN A1C
Hgb A1c MFr Bld: 8.2 % — ABNORMAL HIGH (ref <5.7)
Mean Plasma Glucose: 189 mg/dL — ABNORMAL HIGH (ref <117)

## 2010-03-18 LAB — TROPONIN I: Troponin I: 0.02 ng/mL (ref 0.00–0.06)

## 2010-03-18 LAB — BRAIN NATRIURETIC PEPTIDE: Pro B Natriuretic peptide (BNP): 251 pg/mL — ABNORMAL HIGH (ref 0.0–100.0)

## 2010-03-19 LAB — CBC
MCH: 26.5 pg (ref 26.0–34.0)
MCHC: 32.9 g/dL (ref 30.0–36.0)
MCV: 80.7 fL (ref 78.0–100.0)
Platelets: 114 10*3/uL — ABNORMAL LOW (ref 150–400)

## 2010-03-19 LAB — BASIC METABOLIC PANEL
CO2: 24 mEq/L (ref 19–32)
Calcium: 8.9 mg/dL (ref 8.4–10.5)
Chloride: 103 mEq/L (ref 96–112)
Creatinine, Ser: 1.41 mg/dL (ref 0.4–1.5)
Glucose, Bld: 304 mg/dL — ABNORMAL HIGH (ref 70–99)

## 2010-03-19 LAB — FERRITIN: Ferritin: 28 ng/mL (ref 22–322)

## 2010-03-21 LAB — DIFFERENTIAL
Basophils Absolute: 0 10*3/uL (ref 0.0–0.1)
Basophils Relative: 0 % (ref 0–1)
Eosinophils Absolute: 0.1 10*3/uL (ref 0.0–0.7)
Monocytes Absolute: 0.5 10*3/uL (ref 0.1–1.0)
Monocytes Relative: 13 % — ABNORMAL HIGH (ref 3–12)
Neutro Abs: 2.8 10*3/uL (ref 1.7–7.7)

## 2010-03-21 LAB — CBC
HCT: 25.7 % — ABNORMAL LOW (ref 39.0–52.0)
Hemoglobin: 8.8 g/dL — ABNORMAL LOW (ref 13.0–17.0)
MCH: 30.7 pg (ref 26.0–34.0)
MCHC: 34.1 g/dL (ref 30.0–36.0)
RDW: 16.6 % — ABNORMAL HIGH (ref 11.5–15.5)

## 2010-03-21 LAB — BASIC METABOLIC PANEL
BUN: 20 mg/dL (ref 6–23)
CO2: 21 mEq/L (ref 19–32)
Calcium: 7.7 mg/dL — ABNORMAL LOW (ref 8.4–10.5)
GFR calc non Af Amer: 58 mL/min — ABNORMAL LOW (ref >60)
Glucose, Bld: 115 mg/dL — ABNORMAL HIGH (ref 70–99)
Potassium: 4.1 mEq/L (ref 3.5–5.1)

## 2010-03-21 LAB — GLUCOSE, CAPILLARY: Glucose-Capillary: 220 mg/dL — ABNORMAL HIGH (ref 70–99)

## 2010-03-22 LAB — GLUCOSE, CAPILLARY
Glucose-Capillary: 105 mg/dL — ABNORMAL HIGH (ref 70–99)
Glucose-Capillary: 108 mg/dL — ABNORMAL HIGH (ref 70–99)
Glucose-Capillary: 124 mg/dL — ABNORMAL HIGH (ref 70–99)
Glucose-Capillary: 125 mg/dL — ABNORMAL HIGH (ref 70–99)
Glucose-Capillary: 128 mg/dL — ABNORMAL HIGH (ref 70–99)
Glucose-Capillary: 139 mg/dL — ABNORMAL HIGH (ref 70–99)
Glucose-Capillary: 140 mg/dL — ABNORMAL HIGH (ref 70–99)
Glucose-Capillary: 155 mg/dL — ABNORMAL HIGH (ref 70–99)
Glucose-Capillary: 181 mg/dL — ABNORMAL HIGH (ref 70–99)
Glucose-Capillary: 196 mg/dL — ABNORMAL HIGH (ref 70–99)
Glucose-Capillary: 234 mg/dL — ABNORMAL HIGH (ref 70–99)
Glucose-Capillary: 246 mg/dL — ABNORMAL HIGH (ref 70–99)
Glucose-Capillary: 258 mg/dL — ABNORMAL HIGH (ref 70–99)
Glucose-Capillary: 300 mg/dL — ABNORMAL HIGH (ref 70–99)
Glucose-Capillary: 303 mg/dL — ABNORMAL HIGH (ref 70–99)
Glucose-Capillary: 316 mg/dL — ABNORMAL HIGH (ref 70–99)
Glucose-Capillary: 330 mg/dL — ABNORMAL HIGH (ref 70–99)
Glucose-Capillary: 338 mg/dL — ABNORMAL HIGH (ref 70–99)
Glucose-Capillary: 347 mg/dL — ABNORMAL HIGH (ref 70–99)
Glucose-Capillary: 349 mg/dL — ABNORMAL HIGH (ref 70–99)
Glucose-Capillary: 369 mg/dL — ABNORMAL HIGH (ref 70–99)
Glucose-Capillary: 84 mg/dL (ref 70–99)
Glucose-Capillary: 89 mg/dL (ref 70–99)

## 2010-03-22 LAB — TYPE AND SCREEN: ABO/RH(D): O POS

## 2010-03-22 LAB — BASIC METABOLIC PANEL
BUN: 26 mg/dL — ABNORMAL HIGH (ref 6–23)
CO2: 20 mEq/L (ref 19–32)
CO2: 20 mEq/L (ref 19–32)
CO2: 25 mEq/L (ref 19–32)
Calcium: 7.4 mg/dL — ABNORMAL LOW (ref 8.4–10.5)
Calcium: 7.6 mg/dL — ABNORMAL LOW (ref 8.4–10.5)
Calcium: 7.9 mg/dL — ABNORMAL LOW (ref 8.4–10.5)
Chloride: 112 mEq/L (ref 96–112)
Creatinine, Ser: 1.46 mg/dL (ref 0.4–1.5)
Creatinine, Ser: 1.53 mg/dL — ABNORMAL HIGH (ref 0.4–1.5)
GFR calc Af Amer: 52 mL/min — ABNORMAL LOW (ref >60)
GFR calc Af Amer: 56 mL/min — ABNORMAL LOW (ref >60)
GFR calc Af Amer: 59 mL/min — ABNORMAL LOW (ref >60)
GFR calc non Af Amer: 43 mL/min — ABNORMAL LOW (ref >60)
GFR calc non Af Amer: 46 mL/min — ABNORMAL LOW (ref >60)
Glucose, Bld: 108 mg/dL — ABNORMAL HIGH (ref 70–99)
Glucose, Bld: 257 mg/dL — ABNORMAL HIGH (ref 70–99)
Sodium: 133 mEq/L — ABNORMAL LOW (ref 135–145)
Sodium: 136 mEq/L (ref 135–145)

## 2010-03-22 LAB — COMPREHENSIVE METABOLIC PANEL
ALT: 18 U/L (ref 0–53)
ALT: 21 U/L (ref 0–53)
AST: 27 U/L (ref 0–37)
Alkaline Phosphatase: 62 U/L (ref 39–117)
BUN: 48 mg/dL — ABNORMAL HIGH (ref 6–23)
CO2: 23 mEq/L (ref 19–32)
Calcium: 8.4 mg/dL (ref 8.4–10.5)
Creatinine, Ser: 1.63 mg/dL — ABNORMAL HIGH (ref 0.4–1.5)
GFR calc Af Amer: 50 mL/min — ABNORMAL LOW (ref >60)
GFR calc non Af Amer: 43 mL/min — ABNORMAL LOW (ref >60)
Glucose, Bld: 170 mg/dL — ABNORMAL HIGH (ref 70–99)
Glucose, Bld: 405 mg/dL — ABNORMAL HIGH (ref 70–99)
Potassium: 4.7 mEq/L (ref 3.5–5.1)
Sodium: 135 mEq/L (ref 135–145)
Total Protein: 6.5 g/dL (ref 6.0–8.3)
Total Protein: 8.1 g/dL (ref 6.0–8.3)

## 2010-03-22 LAB — CBC
HCT: 25 % — ABNORMAL LOW (ref 39.0–52.0)
HCT: 30.3 % — ABNORMAL LOW (ref 39.0–52.0)
HCT: 32.6 % — ABNORMAL LOW (ref 39.0–52.0)
Hemoglobin: 10.2 g/dL — ABNORMAL LOW (ref 13.0–17.0)
Hemoglobin: 8.5 g/dL — ABNORMAL LOW (ref 13.0–17.0)
Hemoglobin: 8.5 g/dL — ABNORMAL LOW (ref 13.0–17.0)
Hemoglobin: 8.8 g/dL — ABNORMAL LOW (ref 13.0–17.0)
Hemoglobin: 10.9 g/dL — ABNORMAL LOW (ref 13.0–17.0)
MCH: 30.1 pg (ref 26.0–34.0)
MCH: 30.5 pg (ref 26.0–34.0)
MCH: 30.7 pg (ref 26.0–34.0)
MCHC: 33.6 g/dL (ref 30.0–36.0)
MCHC: 33.7 g/dL (ref 30.0–36.0)
MCHC: 34.2 g/dL (ref 30.0–36.0)
MCV: 89.5 fL (ref 78.0–100.0)
MCV: 89.7 fL (ref 78.0–100.0)
Platelets: 68 10*3/uL — ABNORMAL LOW (ref 150–400)
Platelets: 76 10*3/uL — ABNORMAL LOW (ref 150–400)
RBC: 2.81 MIL/uL — ABNORMAL LOW (ref 4.22–5.81)
RBC: 2.88 MIL/uL — ABNORMAL LOW (ref 4.22–5.81)
RBC: 3.72 MIL/uL — ABNORMAL LOW (ref 4.22–5.81)
RDW: 15.9 % — ABNORMAL HIGH (ref 11.5–15.5)
RDW: 16.3 % — ABNORMAL HIGH (ref 11.5–15.5)
WBC: 4.3 10*3/uL (ref 4.0–10.5)

## 2010-03-22 LAB — DIFFERENTIAL
Basophils Absolute: 0.1 10*3/uL (ref 0.0–0.1)
Basophils Relative: 0 % (ref 0–1)
Basophils Relative: 1 % (ref 0–1)
Basophils Relative: 2 % — ABNORMAL HIGH (ref 0–1)
Eosinophils Absolute: 0.1 10*3/uL (ref 0.0–0.7)
Eosinophils Absolute: 0.1 10*3/uL (ref 0.0–0.7)
Eosinophils Absolute: 0.1 10*3/uL (ref 0.0–0.7)
Eosinophils Absolute: 0.1 10*3/uL (ref 0.0–0.7)
Eosinophils Relative: 3 % (ref 0–5)
Eosinophils Relative: 4 % (ref 0–5)
Lymphocytes Relative: 5 % — ABNORMAL LOW (ref 12–46)
Lymphocytes Relative: 6 % — ABNORMAL LOW (ref 12–46)
Lymphs Abs: 0.3 10*3/uL — ABNORMAL LOW (ref 0.7–4.0)
Lymphs Abs: 0.3 10*3/uL — ABNORMAL LOW (ref 0.7–4.0)
Lymphs Abs: 0.4 10*3/uL — ABNORMAL LOW (ref 0.7–4.0)
Monocytes Absolute: 0.4 10*3/uL (ref 0.1–1.0)
Monocytes Relative: 10 % (ref 3–12)
Monocytes Relative: 11 % (ref 3–12)
Monocytes Relative: 9 % (ref 3–12)
Neutro Abs: 3 10*3/uL (ref 1.7–7.7)
Neutro Abs: 4.6 10*3/uL (ref 1.7–7.7)
Neutro Abs: 6.9 10*3/uL (ref 1.7–7.7)
Neutrophils Relative %: 78 % — ABNORMAL HIGH (ref 43–77)
Neutrophils Relative %: 81 % — ABNORMAL HIGH (ref 43–77)
Neutrophils Relative %: 83 % — ABNORMAL HIGH (ref 43–77)
Neutrophils Relative %: 85 % — ABNORMAL HIGH (ref 43–77)

## 2010-03-22 LAB — PROTIME-INR
INR: 1.18 (ref 0.00–1.49)
Prothrombin Time: 15.3 seconds — ABNORMAL HIGH (ref 11.6–15.2)

## 2010-03-22 LAB — DIGOXIN LEVEL: Digoxin Level: 0.6 ng/mL — ABNORMAL LOW (ref 0.8–2.0)

## 2010-03-22 LAB — HEMOGLOBIN A1C: Mean Plasma Glucose: 298 mg/dL — ABNORMAL HIGH (ref <117)

## 2010-03-22 LAB — LIPASE, BLOOD: Lipase: 34 U/L (ref 11–59)

## 2010-03-23 LAB — DIFFERENTIAL
Basophils Relative: 0 % (ref 0–1)
Eosinophils Absolute: 0.1 10*3/uL (ref 0.0–0.7)
Eosinophils Relative: 2 % (ref 0–5)
Monocytes Absolute: 0.5 10*3/uL (ref 0.1–1.0)
Monocytes Relative: 8 % (ref 3–12)

## 2010-03-23 LAB — CBC
Hemoglobin: 11.2 g/dL — ABNORMAL LOW (ref 13.0–17.0)
MCHC: 34.4 g/dL (ref 30.0–36.0)
MCV: 87.4 fL (ref 78.0–100.0)
RBC: 3.73 MIL/uL — ABNORMAL LOW (ref 4.22–5.81)

## 2010-03-25 LAB — CBC
MCHC: 34.3 g/dL (ref 30.0–36.0)
Platelets: 95 10*3/uL — ABNORMAL LOW (ref 150–400)
RDW: 16 % — ABNORMAL HIGH (ref 11.5–15.5)

## 2010-03-27 LAB — CBC
MCHC: 33.4 g/dL (ref 30.0–36.0)
RBC: 3.68 MIL/uL — ABNORMAL LOW (ref 4.22–5.81)
WBC: 4.9 10*3/uL (ref 4.0–10.5)

## 2010-03-27 LAB — FERRITIN: Ferritin: 71 ng/mL (ref 22–322)

## 2010-04-06 LAB — IRON AND TIBC
Saturation Ratios: 13 % — ABNORMAL LOW (ref 20–55)
UIBC: 304 ug/dL

## 2010-04-06 LAB — CBC
HCT: 33.2 % — ABNORMAL LOW (ref 39.0–52.0)
Hemoglobin: 11.1 g/dL — ABNORMAL LOW (ref 13.0–17.0)
Platelets: 99 10*3/uL — ABNORMAL LOW (ref 150–400)
WBC: 5.6 10*3/uL (ref 4.0–10.5)

## 2010-04-08 LAB — CBC
MCHC: 34 g/dL (ref 30.0–36.0)
RBC: 3.53 MIL/uL — ABNORMAL LOW (ref 4.22–5.81)

## 2010-04-08 LAB — GLUCOSE, CAPILLARY: Glucose-Capillary: 188 mg/dL — ABNORMAL HIGH (ref 70–99)

## 2010-04-09 LAB — CBC
HCT: 35.3 % — ABNORMAL LOW (ref 39.0–52.0)
Platelets: 84 10*3/uL — ABNORMAL LOW (ref 150–400)
WBC: 5.3 10*3/uL (ref 4.0–10.5)

## 2010-04-10 LAB — CBC
HCT: 33.4 % — ABNORMAL LOW (ref 39.0–52.0)
Hemoglobin: 11.3 g/dL — ABNORMAL LOW (ref 13.0–17.0)
RBC: 3.66 MIL/uL — ABNORMAL LOW (ref 4.22–5.81)
RDW: 14.9 % (ref 11.5–15.5)
WBC: 4.6 10*3/uL (ref 4.0–10.5)

## 2010-04-11 LAB — CBC
HCT: 33.7 % — ABNORMAL LOW (ref 39.0–52.0)
Hemoglobin: 11.5 g/dL — ABNORMAL LOW (ref 13.0–17.0)
MCHC: 34.2 g/dL (ref 30.0–36.0)
MCV: 91.8 fL (ref 78.0–100.0)
RDW: 14.3 % (ref 11.5–15.5)

## 2010-04-13 LAB — CBC
HCT: 32.7 % — ABNORMAL LOW (ref 39.0–52.0)
Hemoglobin: 11.2 g/dL — ABNORMAL LOW (ref 13.0–17.0)
MCHC: 34 g/dL (ref 30.0–36.0)
MCHC: 34.2 g/dL (ref 30.0–36.0)
RDW: 23.4 % — ABNORMAL HIGH (ref 11.5–15.5)
RDW: 22.8 % — ABNORMAL HIGH (ref 11.5–15.5)

## 2010-04-14 LAB — COMPREHENSIVE METABOLIC PANEL
ALT: 14 U/L (ref 0–53)
ALT: 14 U/L (ref 0–53)
AST: 20 U/L (ref 0–37)
Albumin: 2.7 g/dL — ABNORMAL LOW (ref 3.5–5.2)
Calcium: 8 mg/dL — ABNORMAL LOW (ref 8.4–10.5)
Calcium: 8 mg/dL — ABNORMAL LOW (ref 8.4–10.5)
Creatinine, Ser: 1.41 mg/dL (ref 0.4–1.5)
GFR calc Af Amer: 59 mL/min — ABNORMAL LOW (ref >60)
GFR calc Af Amer: >60 mL/min (ref >60)
Glucose, Bld: 229 mg/dL — ABNORMAL HIGH (ref 70–99)
Sodium: 131 mEq/L — ABNORMAL LOW (ref 135–145)
Sodium: 135 mEq/L (ref 135–145)
Total Protein: 7.3 g/dL (ref 6.0–8.3)
Total Protein: 7.9 g/dL (ref 6.0–8.3)

## 2010-04-14 LAB — CBC
HCT: 29.7 % — ABNORMAL LOW (ref 39.0–52.0)
HCT: 27.8 % — ABNORMAL LOW (ref 39.0–52.0)
HCT: 28.8 % — ABNORMAL LOW (ref 39.0–52.0)
HCT: 29.2 % — ABNORMAL LOW (ref 39.0–52.0)
HCT: 30.4 % — ABNORMAL LOW (ref 39.0–52.0)
Hemoglobin: 9.9 g/dL — ABNORMAL LOW (ref 13.0–17.0)
Hemoglobin: 10 g/dL — ABNORMAL LOW (ref 13.0–17.0)
Hemoglobin: 9.1 g/dL — ABNORMAL LOW (ref 13.0–17.0)
Hemoglobin: 9.3 g/dL — ABNORMAL LOW (ref 13.0–17.0)
Hemoglobin: 9.6 g/dL — ABNORMAL LOW (ref 13.0–17.0)
Hemoglobin: 9.8 g/dL — ABNORMAL LOW (ref 13.0–17.0)
MCHC: 32.4 g/dL (ref 30.0–36.0)
MCHC: 32.6 g/dL (ref 30.0–36.0)
MCHC: 33.2 g/dL (ref 30.0–36.0)
MCHC: 32 g/dL (ref 30.0–36.0)
MCHC: 32.5 g/dL (ref 30.0–36.0)
MCHC: 32.8 g/dL (ref 30.0–36.0)
MCHC: 33.1 g/dL (ref 30.0–36.0)
MCHC: 33.6 g/dL (ref 30.0–36.0)
MCV: 79.7 fL (ref 78.0–100.0)
MCV: 79.6 fL (ref 78.0–100.0)
MCV: 80 fL (ref 78.0–100.0)
MCV: 80 fL (ref 78.0–100.0)
Platelets: 96 10*3/uL — ABNORMAL LOW (ref 150–400)
Platelets: 97 10*3/uL — ABNORMAL LOW (ref 150–400)
Platelets: 97 10*3/uL — ABNORMAL LOW (ref 150–400)
RBC: 3.41 MIL/uL — ABNORMAL LOW (ref 4.22–5.81)
RBC: 3.83 MIL/uL — ABNORMAL LOW (ref 4.22–5.81)
RDW: 18.2 % — ABNORMAL HIGH (ref 11.5–15.5)
RDW: 18.5 % — ABNORMAL HIGH (ref 11.5–15.5)
RDW: 17.6 % — ABNORMAL HIGH (ref 11.5–15.5)
RDW: 17.8 % — ABNORMAL HIGH (ref 11.5–15.5)
RDW: 18 % — ABNORMAL HIGH (ref 11.5–15.5)
RDW: 18 % — ABNORMAL HIGH (ref 11.5–15.5)
RDW: 18.1 % — ABNORMAL HIGH (ref 11.5–15.5)
RDW: 18.4 % — ABNORMAL HIGH (ref 11.5–15.5)
RDW: 18.7 % — ABNORMAL HIGH (ref 11.5–15.5)

## 2010-04-14 LAB — DIFFERENTIAL
Basophils Absolute: 0 10*3/uL (ref 0.0–0.1)
Eosinophils Absolute: 0.1 10*3/uL (ref 0.0–0.7)
Eosinophils Relative: 1 % (ref 0–5)
Eosinophils Relative: 1 % (ref 0–5)
Lymphocytes Relative: 4 % — ABNORMAL LOW (ref 12–46)
Lymphocytes Relative: 5 % — ABNORMAL LOW (ref 12–46)
Lymphs Abs: 0.2 10*3/uL — ABNORMAL LOW (ref 0.7–4.0)
Monocytes Absolute: 0.7 10*3/uL (ref 0.1–1.0)
Monocytes Relative: 11 % (ref 3–12)
Monocytes Relative: 13 % — ABNORMAL HIGH (ref 3–12)

## 2010-04-14 LAB — TYPE AND SCREEN
ABO/RH(D): O POS
Antibody Screen: NEGATIVE

## 2010-04-14 LAB — GLUCOSE, CAPILLARY
Glucose-Capillary: 134 mg/dL — ABNORMAL HIGH (ref 70–99)
Glucose-Capillary: 157 mg/dL — ABNORMAL HIGH (ref 70–99)
Glucose-Capillary: 215 mg/dL — ABNORMAL HIGH (ref 70–99)
Glucose-Capillary: 252 mg/dL — ABNORMAL HIGH (ref 70–99)
Glucose-Capillary: 292 mg/dL — ABNORMAL HIGH (ref 70–99)
Glucose-Capillary: 334 mg/dL — ABNORMAL HIGH (ref 70–99)
Glucose-Capillary: 367 mg/dL — ABNORMAL HIGH (ref 70–99)

## 2010-04-14 LAB — RETICULOCYTES
RBC.: 3.58 MIL/uL — ABNORMAL LOW (ref 4.22–5.81)
Retic Ct Pct: 2.4 % (ref 0.4–3.1)

## 2010-04-14 LAB — IRON AND TIBC: UIBC: 390 ug/dL

## 2010-04-14 LAB — BODY FLUID CELL COUNT WITH DIFFERENTIAL: Neutrophil Count, Fluid: 10 % (ref 0–25)

## 2010-04-14 LAB — CROSSMATCH: Antibody Screen: NEGATIVE

## 2010-04-14 LAB — AFB CULTURE WITH SMEAR (NOT AT ARMC): Acid Fast Smear: NONE SEEN

## 2010-04-14 LAB — FUNGUS CULTURE W SMEAR: Fungal Smear: NONE SEEN

## 2010-04-14 LAB — BODY FLUID CULTURE

## 2010-04-14 LAB — HEMOGLOBIN A1C: Hgb A1c MFr Bld: 10.5 % — ABNORMAL HIGH (ref 4.6–6.1)

## 2010-04-14 LAB — POCT CARDIAC MARKERS

## 2010-04-14 LAB — LACTIC ACID, PLASMA: Lactic Acid, Venous: 2.6 mmol/L — ABNORMAL HIGH (ref 0.5–2.2)

## 2010-04-14 LAB — ALBUMIN, FLUID (OTHER): Albumin, Fluid: <1 g/dL

## 2010-04-14 LAB — PROTIME-INR: INR: 1.2 (ref 0.00–1.49)

## 2010-04-14 LAB — FOLATE: Folate: 13.8 ng/mL

## 2010-04-14 LAB — VITAMIN B12: Vitamin B-12: 302 pg/mL (ref 211–911)

## 2010-04-14 LAB — FERRITIN: Ferritin: 31 ng/mL (ref 22–322)

## 2010-04-15 LAB — CBC
HCT: 29.8 % — ABNORMAL LOW (ref 39.0–52.0)
Hemoglobin: 9.8 g/dL — ABNORMAL LOW (ref 13.0–17.0)
MCHC: 32.8 g/dL (ref 30.0–36.0)
Platelets: 96 10*3/uL — ABNORMAL LOW (ref 150–400)
RDW: 17.7 % — ABNORMAL HIGH (ref 11.5–15.5)

## 2010-04-16 LAB — CBC
Hemoglobin: 10.3 g/dL — ABNORMAL LOW (ref 13.0–17.0)
MCHC: 32.9 g/dL (ref 30.0–36.0)
RBC: 3.83 MIL/uL — ABNORMAL LOW (ref 4.22–5.81)
RDW: 18.5 % — ABNORMAL HIGH (ref 11.5–15.5)

## 2010-04-20 LAB — DIFFERENTIAL
Basophils Absolute: 0 10*3/uL (ref 0.0–0.1)
Basophils Absolute: 0 10*3/uL (ref 0.0–0.1)
Basophils Absolute: 0 10*3/uL (ref 0.0–0.1)
Basophils Absolute: 0 10*3/uL (ref 0.0–0.1)
Basophils Absolute: 0 10*3/uL (ref 0.0–0.1)
Basophils Relative: 1 % (ref 0–1)
Basophils Relative: 1 % (ref 0–1)
Basophils Relative: 1 % (ref 0–1)
Eosinophils Absolute: 0.1 10*3/uL (ref 0.0–0.7)
Eosinophils Absolute: 0.1 10*3/uL (ref 0.0–0.7)
Eosinophils Absolute: 0.1 10*3/uL (ref 0.0–0.7)
Eosinophils Absolute: 0 10*3/uL (ref 0.0–0.7)
Eosinophils Absolute: 0.1 10*3/uL (ref 0.0–0.7)
Eosinophils Relative: 0 % (ref 0–5)
Eosinophils Relative: 2 % (ref 0–5)
Eosinophils Relative: 1 % (ref 0–5)
Lymphocytes Relative: 4 % — ABNORMAL LOW (ref 12–46)
Lymphocytes Relative: 5 % — ABNORMAL LOW (ref 12–46)
Lymphocytes Relative: 7 % — ABNORMAL LOW (ref 12–46)
Lymphocytes Relative: 7 % — ABNORMAL LOW (ref 12–46)
Lymphs Abs: 0.4 10*3/uL — ABNORMAL LOW (ref 0.7–4.0)
Lymphs Abs: 0.2 10*3/uL — ABNORMAL LOW (ref 0.7–4.0)
Lymphs Abs: 0.4 10*3/uL — ABNORMAL LOW (ref 0.7–4.0)
Lymphs Abs: 0.4 10*3/uL — ABNORMAL LOW (ref 0.7–4.0)
Lymphs Abs: 0.4 10*3/uL — ABNORMAL LOW (ref 0.7–4.0)
Monocytes Absolute: 0.6 10*3/uL (ref 0.1–1.0)
Monocytes Absolute: 0.7 10*3/uL (ref 0.1–1.0)
Monocytes Absolute: 0.9 10*3/uL (ref 0.1–1.0)
Monocytes Absolute: 0.8 10*3/uL (ref 0.1–1.0)
Monocytes Absolute: 0.8 10*3/uL (ref 0.1–1.0)
Monocytes Relative: 11 % (ref 3–12)
Monocytes Relative: 12 % (ref 3–12)
Monocytes Relative: 15 % — ABNORMAL HIGH (ref 3–12)
Monocytes Relative: 13 % — ABNORMAL HIGH (ref 3–12)
Monocytes Relative: 14 % — ABNORMAL HIGH (ref 3–12)
Neutro Abs: 4.6 10*3/uL (ref 1.7–7.7)
Neutro Abs: 4.7 10*3/uL (ref 1.7–7.7)
Neutro Abs: 6.6 10*3/uL (ref 1.7–7.7)
Neutro Abs: 6.7 10*3/uL (ref 1.7–7.7)
Neutrophils Relative %: 78 % — ABNORMAL HIGH (ref 43–77)
Neutrophils Relative %: 84 % — ABNORMAL HIGH (ref 43–77)
Neutrophils Relative %: 76 % (ref 43–77)
Neutrophils Relative %: 78 % — ABNORMAL HIGH (ref 43–77)
Neutrophils Relative %: 82 % — ABNORMAL HIGH (ref 43–77)

## 2010-04-20 LAB — CROSSMATCH

## 2010-04-20 LAB — GLUCOSE, CAPILLARY
Glucose-Capillary: 120 mg/dL — ABNORMAL HIGH (ref 70–99)
Glucose-Capillary: 154 mg/dL — ABNORMAL HIGH (ref 70–99)
Glucose-Capillary: 159 mg/dL — ABNORMAL HIGH (ref 70–99)
Glucose-Capillary: 174 mg/dL — ABNORMAL HIGH (ref 70–99)
Glucose-Capillary: 185 mg/dL — ABNORMAL HIGH (ref 70–99)
Glucose-Capillary: 235 mg/dL — ABNORMAL HIGH (ref 70–99)
Glucose-Capillary: 262 mg/dL — ABNORMAL HIGH (ref 70–99)
Glucose-Capillary: 334 mg/dL — ABNORMAL HIGH (ref 70–99)
Glucose-Capillary: 67 mg/dL — ABNORMAL LOW (ref 70–99)
Glucose-Capillary: 75 mg/dL (ref 70–99)
Glucose-Capillary: 97 mg/dL (ref 70–99)
Glucose-Capillary: 128 mg/dL — ABNORMAL HIGH (ref 70–99)
Glucose-Capillary: 136 mg/dL — ABNORMAL HIGH (ref 70–99)
Glucose-Capillary: 157 mg/dL — ABNORMAL HIGH (ref 70–99)
Glucose-Capillary: 173 mg/dL — ABNORMAL HIGH (ref 70–99)
Glucose-Capillary: 174 mg/dL — ABNORMAL HIGH (ref 70–99)
Glucose-Capillary: 201 mg/dL — ABNORMAL HIGH (ref 70–99)
Glucose-Capillary: 225 mg/dL — ABNORMAL HIGH (ref 70–99)
Glucose-Capillary: 245 mg/dL — ABNORMAL HIGH (ref 70–99)
Glucose-Capillary: 83 mg/dL (ref 70–99)

## 2010-04-20 LAB — POCT I-STAT 4, (NA,K, GLUC, HGB,HCT)
Glucose, Bld: 81 mg/dL (ref 70–99)
HCT: 33 % — ABNORMAL LOW (ref 39.0–52.0)
Potassium: 4.2 mEq/L (ref 3.5–5.1)

## 2010-04-20 LAB — PROTIME-INR
INR: 1.2 (ref 0.00–1.49)
INR: 1.3 (ref 0.00–1.49)
INR: 1.4 (ref 0.00–1.49)
Prothrombin Time: 15.5 seconds — ABNORMAL HIGH (ref 11.6–15.2)
Prothrombin Time: 16.1 seconds — ABNORMAL HIGH (ref 11.6–15.2)
Prothrombin Time: 17.3 seconds — ABNORMAL HIGH (ref 11.6–15.2)

## 2010-04-20 LAB — TYPE AND SCREEN: Antibody Screen: NEGATIVE

## 2010-04-20 LAB — CBC
HCT: 27.6 % — ABNORMAL LOW (ref 39.0–52.0)
HCT: 30.2 % — ABNORMAL LOW (ref 39.0–52.0)
HCT: 33.8 % — ABNORMAL LOW (ref 39.0–52.0)
HCT: 33.2 % — ABNORMAL LOW (ref 39.0–52.0)
HCT: 33.8 % — ABNORMAL LOW (ref 39.0–52.0)
HCT: 34.2 % — ABNORMAL LOW (ref 39.0–52.0)
Hemoglobin: 10.2 g/dL — ABNORMAL LOW (ref 13.0–17.0)
Hemoglobin: 9.7 g/dL — ABNORMAL LOW (ref 13.0–17.0)
Hemoglobin: 9.7 g/dL — ABNORMAL LOW (ref 13.0–17.0)
MCHC: 32.2 g/dL (ref 30.0–36.0)
MCHC: 32.2 g/dL (ref 30.0–36.0)
MCHC: 32.3 g/dL (ref 30.0–36.0)
MCHC: 32.5 g/dL (ref 30.0–36.0)
MCHC: 31.4 g/dL (ref 30.0–36.0)
MCHC: 31.9 g/dL (ref 30.0–36.0)
MCHC: 32.3 g/dL (ref 30.0–36.0)
MCHC: 32.5 g/dL (ref 30.0–36.0)
MCHC: 33.1 g/dL (ref 30.0–36.0)
MCV: 80.1 fL (ref 78.0–100.0)
MCV: 82.8 fL (ref 78.0–100.0)
MCV: 83 fL (ref 78.0–100.0)
MCV: 84.7 fL (ref 78.0–100.0)
MCV: 83.6 fL (ref 78.0–100.0)
MCV: 83.7 fL (ref 78.0–100.0)
MCV: 84.4 fL (ref 78.0–100.0)
Platelets: 117 10*3/uL — ABNORMAL LOW (ref 150–400)
Platelets: 109 10*3/uL — ABNORMAL LOW (ref 150–400)
Platelets: 110 10*3/uL — ABNORMAL LOW (ref 150–400)
Platelets: 93 10*3/uL — ABNORMAL LOW (ref 150–400)
RBC: 3.45 MIL/uL — ABNORMAL LOW (ref 4.22–5.81)
RBC: 3.64 MIL/uL — ABNORMAL LOW (ref 4.22–5.81)
RBC: 3.75 MIL/uL — ABNORMAL LOW (ref 4.22–5.81)
RBC: 3.77 MIL/uL — ABNORMAL LOW (ref 4.22–5.81)
RBC: 3.89 MIL/uL — ABNORMAL LOW (ref 4.22–5.81)
RBC: 3.93 MIL/uL — ABNORMAL LOW (ref 4.22–5.81)
RBC: 4.04 MIL/uL — ABNORMAL LOW (ref 4.22–5.81)
RBC: 4.09 MIL/uL — ABNORMAL LOW (ref 4.22–5.81)
RDW: 19.3 % — ABNORMAL HIGH (ref 11.5–15.5)
RDW: 20.3 % — ABNORMAL HIGH (ref 11.5–15.5)
RDW: 20.4 % — ABNORMAL HIGH (ref 11.5–15.5)
RDW: 19.6 % — ABNORMAL HIGH (ref 11.5–15.5)
RDW: 19.8 % — ABNORMAL HIGH (ref 11.5–15.5)
RDW: 21.1 % — ABNORMAL HIGH (ref 11.5–15.5)
WBC: 6.8 10*3/uL (ref 4.0–10.5)
WBC: 7.9 10*3/uL (ref 4.0–10.5)
WBC: 8.4 10*3/uL (ref 4.0–10.5)
WBC: 5.2 10*3/uL (ref 4.0–10.5)

## 2010-04-20 LAB — BASIC METABOLIC PANEL
BUN: 34 mg/dL — ABNORMAL HIGH (ref 6–23)
CO2: 26 mEq/L (ref 19–32)
CO2: 26 mEq/L (ref 19–32)
CO2: 27 mEq/L (ref 19–32)
Calcium: 7.8 mg/dL — ABNORMAL LOW (ref 8.4–10.5)
Calcium: 7.9 mg/dL — ABNORMAL LOW (ref 8.4–10.5)
Calcium: 8 mg/dL — ABNORMAL LOW (ref 8.4–10.5)
Chloride: 103 mEq/L (ref 96–112)
Chloride: 107 mEq/L (ref 96–112)
Creatinine, Ser: 1.17 mg/dL (ref 0.4–1.5)
GFR calc Af Amer: >60 mL/min (ref >60)
GFR calc Af Amer: >60 mL/min (ref >60)
GFR calc non Af Amer: 53 mL/min — ABNORMAL LOW (ref >60)
GFR calc non Af Amer: 57 mL/min — ABNORMAL LOW (ref >60)
Glucose, Bld: 106 mg/dL — ABNORMAL HIGH (ref 70–99)
Glucose, Bld: 88 mg/dL (ref 70–99)
Potassium: 4.3 mEq/L (ref 3.5–5.1)
Potassium: 3.6 mEq/L (ref 3.5–5.1)
Sodium: 134 mEq/L — ABNORMAL LOW (ref 135–145)
Sodium: 137 mEq/L (ref 135–145)

## 2010-04-20 LAB — COMPREHENSIVE METABOLIC PANEL
ALT: 14 U/L (ref 0–53)
ALT: 15 U/L (ref 0–53)
AST: 21 U/L (ref 0–37)
AST: 18 U/L (ref 0–37)
AST: 21 U/L (ref 0–37)
Albumin: 2.4 g/dL — ABNORMAL LOW (ref 3.5–5.2)
Albumin: 2.5 g/dL — ABNORMAL LOW (ref 3.5–5.2)
Albumin: 2.5 g/dL — ABNORMAL LOW (ref 3.5–5.2)
BUN: 47 mg/dL — ABNORMAL HIGH (ref 6–23)
BUN: 23 mg/dL (ref 6–23)
CO2: 27 mEq/L (ref 19–32)
CO2: 27 mEq/L (ref 19–32)
CO2: 28 mEq/L (ref 19–32)
CO2: 28 mEq/L (ref 19–32)
Calcium: 8.4 mg/dL (ref 8.4–10.5)
Calcium: 7.9 mg/dL — ABNORMAL LOW (ref 8.4–10.5)
Calcium: 8 mg/dL — ABNORMAL LOW (ref 8.4–10.5)
Calcium: 8 mg/dL — ABNORMAL LOW (ref 8.4–10.5)
Calcium: 8.2 mg/dL — ABNORMAL LOW (ref 8.4–10.5)
Chloride: 100 mEq/L (ref 96–112)
Chloride: 102 mEq/L (ref 96–112)
Creatinine, Ser: 1.46 mg/dL (ref 0.4–1.5)
Creatinine, Ser: 1.18 mg/dL (ref 0.4–1.5)
Creatinine, Ser: 1.33 mg/dL (ref 0.4–1.5)
GFR calc Af Amer: >60 mL/min (ref >60)
GFR calc Af Amer: >60 mL/min (ref >60)
GFR calc Af Amer: >60 mL/min (ref >60)
GFR calc non Af Amer: 49 mL/min — ABNORMAL LOW (ref >60)
GFR calc non Af Amer: 54 mL/min — ABNORMAL LOW (ref >60)
GFR calc non Af Amer: >60 mL/min (ref >60)
Glucose, Bld: 434 mg/dL — ABNORMAL HIGH (ref 70–99)
Glucose, Bld: 187 mg/dL — ABNORMAL HIGH (ref 70–99)
Glucose, Bld: 248 mg/dL — ABNORMAL HIGH (ref 70–99)
Glucose, Bld: 365 mg/dL — ABNORMAL HIGH (ref 70–99)
Potassium: 3.9 mEq/L (ref 3.5–5.1)
Sodium: 131 mEq/L — ABNORMAL LOW (ref 135–145)
Sodium: 137 mEq/L (ref 135–145)
Total Bilirubin: 0.7 mg/dL (ref 0.3–1.2)
Total Bilirubin: 2 mg/dL — ABNORMAL HIGH (ref 0.3–1.2)
Total Protein: 6.3 g/dL (ref 6.0–8.3)
Total Protein: 6.4 g/dL (ref 6.0–8.3)
Total Protein: 6.6 g/dL (ref 6.0–8.3)

## 2010-04-20 LAB — PREPARE RBC (CROSSMATCH)

## 2010-04-20 LAB — POCT CARDIAC MARKERS
CKMB, poc: 3 ng/mL (ref 1.0–8.0)
Myoglobin, poc: 129 ng/mL (ref 12–200)
Troponin i, poc: <0.05 ng/mL (ref 0.00–0.09)

## 2010-04-20 LAB — HEMOGLOBIN AND HEMATOCRIT, BLOOD
HCT: 26.1 % — ABNORMAL LOW (ref 39.0–52.0)
HCT: 30.6 % — ABNORMAL LOW (ref 39.0–52.0)
HCT: 32.1 % — ABNORMAL LOW (ref 39.0–52.0)
HCT: 33 % — ABNORMAL LOW (ref 39.0–52.0)
Hemoglobin: 8.3 g/dL — ABNORMAL LOW (ref 13.0–17.0)
Hemoglobin: 10.2 g/dL — ABNORMAL LOW (ref 13.0–17.0)
Hemoglobin: 9.7 g/dL — ABNORMAL LOW (ref 13.0–17.0)

## 2010-04-20 LAB — IRON AND TIBC
Saturation Ratios: 10 % — ABNORMAL LOW (ref 20–55)
TIBC: 356 ug/dL (ref 215–435)

## 2010-04-20 LAB — MAGNESIUM: Magnesium: 1.5 mg/dL (ref 1.5–2.5)

## 2010-04-20 LAB — PHOSPHORUS: Phosphorus: 2.8 mg/dL (ref 2.3–4.6)

## 2010-04-20 LAB — LACTATE DEHYDROGENASE: LDH: 168 U/L (ref 94–250)

## 2010-04-21 LAB — CBC
MCV: 82.5 fL (ref 78.0–100.0)
Platelets: 90 10*3/uL — ABNORMAL LOW (ref 150–400)
RDW: 18.5 % — ABNORMAL HIGH (ref 11.5–15.5)
WBC: 5.4 10*3/uL (ref 4.0–10.5)

## 2010-05-04 DIAGNOSIS — R1310 Dysphagia, unspecified: Secondary | ICD-10-CM | POA: Insufficient documentation

## 2010-05-05 ENCOUNTER — Ambulatory Visit: Payer: Medicare Other | Admitting: Cardiology

## 2010-05-05 ENCOUNTER — Encounter: Payer: Self-pay | Admitting: Cardiology

## 2010-05-05 DIAGNOSIS — R55 Syncope and collapse: Secondary | ICD-10-CM | POA: Insufficient documentation

## 2010-05-12 ENCOUNTER — Encounter: Payer: Self-pay | Admitting: Internal Medicine

## 2010-05-19 NOTE — Discharge Summary (Signed)
**Note Willie-Identified via Obfuscation** NAMEDEAVEN, [name]                ACCOUNT NO.:  [id number]   MEDICAL RECORD NO.:  [id number]          PATIENT TYPE:  INP   LOCATION:  A203                          FACILITY:  [facility]   PHYSICIAN:  Osvaldo Shipper, MD     DATE OF BIRTH:  [birth year]   DATE OF ADMISSION:  07/26/2006  DATE OF DISCHARGE:  07/24/2008LH                               DISCHARGE SUMMARY   PRIMARY CARE PHYSICIAN:  Kirk Ruths, M.D. with [facility].   CARDIOLOGIST:  Gerrit Friends. Dietrich Pates, MD, [facility]   DISCHARGE DIAGNOSES:  1. Syncope of unclear etiology requiring outpatient follow-up.  2. History of chronic atrial fibrillation.  3. History of hypertension, diabetes, liver cirrhosis, all stable.   Please see H&P dictated by Dr. Lilian Kapur for details regarding the  patient's presenting illness.   HOSPITAL COURSE:  This is a 65 year old Caucasian male who has a history  of diabetes, hypertension, atrial fibrillation.  He is not on  anticoagulation due to GI bleeding.  He has a history of liver cirrhosis  followed at [facility], history of Crohn's disease is also  present.  The patient presented to his cardiologist's office on July 22,  complaining of three episodes of syncope on Friday, Saturday, and  Monday.  All three episodes happened after he got up from a sitting  position and walked a short distance.  Orthostatics were checked which  were okay.  No evidence for orthostatic hypotension was present.  He  underwent a CT of the head which was unremarkable.  Echocardiogram  showed no significant change from previous echos with EF of about 60%.  Aortic sclerosis was noted.  No other significant abnormalities were  appreciated.  The patient recently had spine surgery to his thoracic  spine for a spinal fracture.  This happened a few months ago apparently.  In any case, the patient was monitored here on telemetry.  His telemetry  showed a few episodes of atrial fibrillation going into 120's,  but the  patient was asymptomatic with these episodes.  He has not had any other  syncope episodes here in the hospital.  Unfortunately, I do not know if  his blood sugar was low when he had these episodes at home, but he has  not had any hypoglycemic episodes here in the hospital.  There is no  evidence for any seizure type activity as well.  The most likely reason  could be arrhythmia considering his presentation and history.  I think  he would benefit from a Holter monitor which we can arrange as an  outpatient.  A seizure is also a possibility hence we will arrange for  an EEG as an outpatient.  The only thing pending during this admission  is a cardiac marker which we will get today.  If that troponin is  negative I think he can go home.  I has already been seen by  cardiologist and they do not recommend any further workup for him at  this point.  The echo report has been discussed above.  EKG just showed  atrial fibrillation  with no other abnormal findings.  His diabetes and  hypertension are all quite stable at this point.  I do not anticipate  making any changes to his home medications.   Because of the abnormal chest x-ray, we will repeat another x-ray today  which shows nonspecific change in the left small effusion and mild left  basilar atelectasis.  The patient was empirically started on Levaquin  yesterday which we will continue for a few days at home.   DISCHARGE MEDICATIONS:  1. Levaquin 500 mg p.o. daily for 4 more days.  Otherwise no other      changes made to his home medications and they are as follows:  2. Cymbalta 60 mg daily.  3. Oxycodone as needed.  4. Aldactone 100 mg daily.  5. Questran four times a day 4 grams.  6. Flomax 0.4 mg daily.  7. Lasix 40 mg daily.  8. Zocor 40 mg daily.  9. Amaryl 4 mg daily.  10.Propranolol 20 mg two times a day.  11.Nexium 40 mg twice a day.  12.Lanoxin 250 mcg once daily.  13.NovoLog sliding scale.  14.Lantus 60 units  subcu daily.   I wonder if he is getting any benefit from his Amaryl, hence, I will  probably discontinue it for now.  Please note, he is on insulin.   FOLLOWUP:  1. With Dr. Dietrich Pates in 1 weeks' time.  2. Outpatient carotid Doppler and outpatient EEG to be set up.  3. Holter monitor will be arranged from [facility] office.  4. Consultations from Dr. Dietrich Pates from [facility].   DIET:  Modified carbohydrate diet.   ACTIVITY:  As before, he gets physical [facility] in [facility]  at [facility] on a daily basis.   Follow up with PMD in 1 month and with Dr. Dietrich Pates next week.   Please note the patient will be discharged once his troponin comes back  as negative.   Discharge Time:      Osvaldo Shipper, MD  Electronically Signed     GK/MEDQ  D:  07/28/2006  T:  07/28/2006  Job:  [id number]   cc:   Gerrit Friends. Dietrich Pates, MD, [facility]  [address]  [place], [place] [address]   Kirk Ruths, M.D.  Fax: [phone]

## 2010-05-19 NOTE — Letter (Signed)
September 02, 2006    Kirk Ruths, M.D.  [address]  [place], [place] [address]   RE:  [name], [name]  MRN:  [id number]  /  DOB:  05/23/1945   Dear Annette Stable:   [name] returns to the office for continued assessment and treatment  of syncope.  Since he saw Dr. Ladona Ridgel three weeks ago, he has had no  recurrent episodes.  He does have occasional momentary dizziness that  passes spontaneously.  He believes that the adjustment in his dose of  diuretic was effective in reducing his symptoms.   He has carried an event recorder for the past week but has had no  symptoms.  He is experiencing substantial skin irritation.   Medications are unchanged from his last visit except for a reduction in  his dose of Aldactone from 100 to 50 mg.   PHYSICAL EXAMINATION:  A pleasant gentleman in no acute distress.  The weight is 260, 2 pounds increased since earlier this month, but 8  pounds increased since I last saw him.  Blood pressure 115/65 without  orthostatic change.  Heart rate 74 and irregular.  Respirations 16.  NECK:  No jugular venous distention; no carotid bruits.  LUNGS:  Clear.  CARDIAC:  Irregular rhythm.  Normal first and second heart sounds;  minimal, if any, early systolic murmur.  ABDOMEN:  Soft and nontender; no organomegaly.  EXTREMITIES:  Edema 1+.  SKIN:  Excoriation and crusting of a circular region that corresponds  with the gel-containing area of his monitoring pads.   [name] appears to be doing better on a decreased dose of diuretic  with some resulting increase in weight and presumably fluid retention.   Recent laboratory shows a stable anemia with hemoglobin of 10.3 and a  borderline MCV of 78.  Electrolytes and LFTs remain normal except for an  albumin of 2.8 and an alkaline phosphatase of 169.   He will complete his period of monitoring. We will attempt to find a  solution to his kidney irritation.  He has an appointment to see Dr.  Ladona Ridgel in a few weeks.  I  will reassess this nice gentleman in two  months.    Sincerely,      Gerrit Friends. Dietrich Pates, MD, [facility]  Electronically Signed    RMR/MedQ  DD: 09/02/2006  DT: 09/03/2006  Job #: [id number]   CC:    Ladona Horns. Mariel Sleet, MD

## 2010-05-19 NOTE — Consult Note (Signed)
NAMEBROOKE, [name]                ACCOUNT NO.:  [id number]   MEDICAL RECORD NO.:  [id number]          PATIENT TYPE:  INP   LOCATION:  IC09                          FACILITY:  [facility]   PHYSICIAN:  R. Roetta Sessions, M.D. DATE OF BIRTH:  [birth year]   DATE OF CONSULTATION:  01/22/2008  DATE OF DISCHARGE:                                 CONSULTATION   REQUESTING PHYSICIAN:  [facility].   REASON FOR CONSULTATION:  Melena.   HPI:  [name] is a 65 year old Caucasian male who is well known to  our practice.  He was actually here at [facility] from January 13, 2008, to January 16, 2008, for variceal bleed.  He has known NASH  cirrhosis, as well as small bowel Crohn disease which has been in  remission.  Yesterday morning around 3 a.m. he was awakened with a small  amount of abdominal cramping. He went to the bathroom.  He had a small  amount of melena.  He then had 3 more episodes throughout the day which  were larger.  He denies any abdominal pain at this time.  He denies any  nausea or vomiting.  He denies any NSAID use.  He tells me he is taking  his propranolol b.i.d., as well as his Crohn meds at home.  He denies  any heartburn or indigestion.  He denies any dysphagia or odynophagia.  He has had increased belching.  He tells me he was feeling fine prior to  yesterday since he had been discharged from the hospital.  He also had  an episode of dizziness yesterday with this as well.  He denies any  shortness of breath, palpitations, or chest pain.  He was found to have  a hemoglobin of 9 grams.  Discharge hemoglobin was around 11.  His  hemoglobin is 9.7 currently and he has 2 units of blood pending.  INR is  1.3.  LFTs showed albumin of 2.4 and a total bilirubin of 2 and are  otherwise normal.  Lipase 35.   PAST MEDICAL/SURGICAL HISTORY:  1. He has history of NASH cirrhosis with esophageal varices and      history of GI bleeding.  He had an EGD by Dr. Jena Gauss on January 15, 2008. He was found to have 4 columns of his grade 2 esophagus      varices.  Two of these columns are status post application of      bands.  He had good hemostasis.  He also had 2 columns of grade 2      varices which were not manipulated and he had portal gastropathy      and marked friability of the gastric mucosa, as well as a small      hiatal hernia.  2. He has history of left pleural effusion which has required      thoracentesis on at least 2 occasions.  3. He has history of small bowl Crohn disease which has been in      remission on Pentasa previously but Dr. Viviann Spare  Zacks down at [facility]      took him off Pentasa and he has been doing fine thus far.  4. He has history of iron malabsorption and has received parenteral      iron under the direction of Dr. Glenford Peers.  5. He does have history of anasarca which has responded to diuretics.  6. He has anemia of chronic disease.  7. He has history of atrial fibrillation, currently not on      anticoagulation.  He is followed by Dr. Dietrich Pates.  8. He has had T10 fracture subluxation with DISH status post spinal      fusion at T7-L1 in February of 2008.  He had C. diff which      complicated his postop course.  9. He has history of B12 deficiency pernicious anemia.  10.Distant history of duodenal ulcer.  11.He has hyperlipidemia.  12.History of chronic sinusitis.  13.Diabetes mellitus.   MEDICATIONS PRIOR TO ADMISSION:  1. Cymbalta 60 mg daily.  2. Zocor 40 mg q.h.s.  3. Propranolol 20 mg b.i.d.  4. Lantus 60 units insulin daily.  5. Niacin 0.25 mg daily.  6. Lasix 40 mg daily.  7. Aldactone 100 mg daily.  8. NovoLog sliding scale insulin p.r.n.   ALLERGIES:  1. CODEINE.  2. LATEX.   FAMILY HISTORY:  There is no significant history of chronic liver  disease.  No known history of IBD or colon carcinoma.   SOCIAL HISTORY:  [name] is divorced.  He denies any tobacco,  alcohol, or drug use.   REVIEW OF SYSTEMS:   See HPI, otherwise:  CONSTITUTIONAL:  He has had  some fatigue.  PULMONARY:  Denies any shortness of breath, dyspnea,  cough, or hemoptysis.  See HPI.   PHYSICAL EXAM:  VITAL SIGNS:  Temp 98.1.  Pulse 88.  Respirations 23.  Blood pressure 150/64.  His O2 saturation is 96% on room air.  His  weight 125.6 kg.  Height 73 inches.  GENERAL:  [name] is a chronically ill-appearing, obese, Caucasian  male who is alert and oriented, pleasant, and cooperative, in no acute  distress.  HEENT:  Sclerae are clear, nonicteric. Conjunctivae pale. Oropharynx  pink and moist without any lesions.  NECK:  Supple without mass or thyromegaly.  CHEST:  Regular rate and rhythm.  HEART:  S1 and S2 without any murmurs, clicks, rubs or gallops.  LUNGS:  Decreased breath sounds bilaterally.  No acute distress.  ABDOMEN:  Protuberant with a large, easily reducible umbilical hernia  which is tender, unable to palpate hepatosplenomegaly.  Exam is limited  given patient's body habitus.  He does have positive fluid wave but  without tense ascites.  EXTREMITIES:  With 1+ pitting pretibial edema.  He does have clubbing as  well.  SKIN:  Pale.  He does have multiple ecchymoses.   LABORATORY STUDIES:  His white blood cell count is 6.3, hemoglobin today  is 9.7, hematocrit 30.6, platelet count 109 yesterday.  He has an INR of  1.3, PT of 16.5, glucose 249, sodium 134, potassium 4.3, chloride 102,  CO2 of 28, BUN 23, creatinine 1.33, glucose 248, calcium 8, total  bilirubin 2, alkaline phosphatase 70, AST 18, ALT 20, total protein 6.3,  albumin 2.4, phosphorus 2.8, magnesium 1.5.  He had a lipase of 35.  He  had a CK-MB of 3 and troponin was negative.   IMPRESSION:  [name] is a 65 year old Caucasian male with history of  NASH cirrhosis  and history of esophageal variceal bleeding.  He was  recently discharged from [facility] on January 16, 2008, status  post vein ligation, now with 4 episodes of melena  yesterday.  I suspect  he has recurrent upper GI bleed at this time most likely from his  varices or portal gastropathy.  He also has history of small bowel Crohn  disease as well.   PLAN:  1. N.p.o.  2. Agree with PPI and IV Sandostatin.  3. Agree with transfusion to keep H and H stable.  4. I agree with b.i.d. propranolol but may need to be increased once      he is stable.  5. EGD with possible band ligation as soon as possible.  We will      discuss this further with Dr. Jena Gauss.   We would like to thank the [facility] for allowing [place] to  participate in the care of [name].      Lorenza Burton, N.P.      Jonathon Bellows, M.D.  Electronically Signed    KJ/MEDQ  D:  01/22/2008  T:  01/22/2008  Job:  [id number]   cc:   Gerrit Friends. Dietrich Pates, MD, [facility]  [address]  [place], [place] [address]   Ladona Horns. Mariel Sleet, MD  Fax: [phone]

## 2010-05-19 NOTE — Consult Note (Signed)
NAMEGUNNER, IODICE                ACCOUNT NO.:  [id number]   MEDICAL RECORD NO.:  [id number]          PATIENT TYPE:  INP   LOCATION:  A313                          FACILITY:  [facility]   PHYSICIAN:  R. Roetta Sessions, M.D. DATE OF BIRTH:  1945/08/21   DATE OF CONSULTATION:  01/13/2008  DATE OF DISCHARGE:                                 CONSULTATION   REASON FOR CONSULTATION:  Melena, drop in hemoglobin.   HISTORY OF PRESENT ILLNESS:  Mr. Jessee Mezera. Tony is a 65 year old  Caucasian male with numerous medical problems including past cirrhosis,  small bowel Crohn disease in remission, a history of congestive heart  failure, atrial fibrillation, poorly-controlled diabetes mellitus who is  admitted to the hospital after experiencing multiple episodes of black  tarry bowel movements yesterday.  He has not had any hematochezia or  hematemesis.  He presented to the emergency department and was seen by  the emergency department staff.  He was noted to be hemodynamically  stable but his hemoglobin has dropped down to 8.3 and 25.52.  His H and  H back on December 22, 2007 was 10.6 and 32.4.  Platelet count currently  116,000 is noted.  His BUN is also noted to be up at 46 with a  creatinine of 1.36.  He has been admitted to the third floor and has  remained hemodynamically stable overnight.   Mr. Torry has a well-established diagnosis of NASH cirrhosis with  decompensation including ascites and recurrent GI bleeding secondary to  esophageal varices and possibly gastric varices.  He has undergone  multiple banding sessions here by multiple physicians and at least 1  session down at [facility] by Dr. Arlyce Dice.   He is followed closely by Drs. Barrett and Piedad Climes [facility]  His last EGD was  done actually down there in March of 2008.  No banding was required.  At  that time, there was a lingering gastric resolution clip which was  placed  previously.   He has a low MELD score (model of end-stage liver disease) and  consequently is not listed for transplant at this time.   He has been fairly well maintained on propranolol and a low-salt diet.  He has had difficulties with a left pleural effusion which has required  thoracentesis on at least 2 occasions up here.   PAST GI HISTORY:  Also has small bowel Crohn disease which has been in  remission on Pentasa previously but one of his hepatologists, Dr. Brooke Dare at [facility], took him off of this agent and he appears to be doing okay  thus far.   He has a history of iron malabsorption and is on parenteral iron therapy  done under the direction of Dr. Glenford Peers.   He was seen down at the [facility] hepatology clinic last fall and the only  thing that was done at that point in time was his diuretic regimen was  apparently modified.   He denies having a fever, chills.  He denies yellow jaundice, clay-  colored stools, dark-colored urine.  Other medical problems include  poorly-controlled diabetes mellitus, a history of atrial fibrillation,  previously anticoagulated but none now, followed by Dr. Dietrich Pates.  Anemia of chronic disease, status post cholecystectomy and a protracted  hospitalization related a T10 fracture subluxation with DISH, status  post spinal fusion T7-L1 back in February of 2008, Clostridium difficile  complicating his postoperative course.  A history of chronic B12  deficiency/pernicious anemia, a distant history of a duodenal ulcer,  hyperlipidemia, a history of sinusitis.  He weighed 282 when he was seen  in our office on December 19, 2007.  He weighs in at 275 this admission.   MEDICATIONS:  Cymbalta 60 mg daily, Zocor 40 mg bedtime, propranolol 20  mg b.i.d., Lantus insulin 60 units subcutaneously daily, niacin daily,  Lasix 40 mg daily, Aldactone 100 mg daily, NovoLog sliding scale.   PAST SURGERIES:  1. Cholecystectomy, spinal fusion, multiple  therapeutic EGDs with      esophageal banding, clipping previously, colonoscopy in 2008      reportedly normal.  2. Multiple thoracenteses.   DRUG ALLERGIES:  CODEINE AND HAS A LATEX ALLERGY AS WELL.   FAMILY HISTORY:  No history of chronic liver disease.  No history of GI  malignancy or inflammatory bowel disease.   SOCIAL HISTORY:  The patient is divorced.  No tobacco.  No alcohol.  No  illicit drugs.   REVIEW OF SYSTEMS:  As in history of present illness.  No odynophagia,  no dysphagia.  No nausea, vomiting.   PHYSICAL EXAMINATION:  An alert, conversant, chronically-ill, pleasant  gentleman found in room 313.  He is well oriented.  There is no  asterixis.  Temperature 97.8, pulse 88, respiratory rate 20, BP 135/71.  SKIN:  Warm and dry.  He has chronic venous stasis changes over the  lower extremities.  There is no scleral icterus.  Conjunctivae are somewhat pale.  Oral  cavity:  No lesions.  CHEST:  Lungs were clear to auscultation but diminished breath sounds,  left base.  CVS:  An regularly regular rhythm without murmur, gallop or rub.  ABDOMEN:  Obese with an easily reducible umbilical hernia.  No obvious  fluid wave or shifting dullness.  No obvious organomegaly.  He has some chronic venous stasis changes of the lower extremities, has  1+ lower extremity edema.   ADDITIONAL LABORATORY DATA:  Sodium 134, potassium 4.9, chloride 104,  CO2 26, glucose 351, BUN 46, creatinine 1.36, calcium 7.8.  H and H this  morning 8.3 and 25.5.  Bilirubin 0.7, alkaline phosphatase 90, AST 21,  ALT 19, total protein 6.5, albumin 2.4.  CBC yesterday:  White count  7.9, H and H 8.9 and 27.6.  MCV 80.1, platelet count 116,000.  INR  normal at 1.2.   IMPRESSION:  Mr. Casagrande is a very pleasant but unfortunate 65 year old  gentleman with numerous medical problems including advanced nonalcoholic  steatohepatitis cirrhosis.  He was admitted now to the hospital with  melena and acute on chronic  anemia.  His BUN is elevated out of  proportion to his creatinine.  I suspect he does indeed have an upper  gastrointestinal bleed.  He has known portal gastropathy, esophageal and  gastric varices.  He remains quite stable at this time.  He will need  evaluation of his upper gastrointestinal tract in the near future.   RECOMMENDATIONS:  1. Given his other comorbidities including cardiac issues, will go      ahead and  give him a slow 2-unit transfusion with packed red blood      cells today and start Sandostatin empirically with a 50 mcg IV      bolus followed by a drip at 25 mcg an hour.  2. Will also initiate prophylactic antibiotic therapy with cefotaxime      1 gram IV q.8 h.  3. Will add a proton pump inhibitor to his regimen empirically.  4. Will tentatively plan for an EGD with therapeutic intent in the OR      with endotracheal intubation/general anesthesia on January 14, 2008      so long as he remains stable between now and that time.  Discussed      the risks, benefits, alternatives and limitations of this approach.      We will see that the blood bank has adequate blood products for him      should they be needed.  The potential for an earlier more emergent      EGD has also been discussed with Mr. Rohr if the tempo of his      bleeding picks up significantly.   Will discuss this approach with anesthesia as well.  Overall his  prognosis is somewhat guarded.   As far as his Crohn disease is concerned, he does indeed appear to be  clinically in remission at this time and no further intervention is  recommended.   I would like to thank Dr. Elige Radon __________ for allowing [place] to see this  nice gentleman once again.  Further recommendations to follow.      Jonathon Bellows, M.D.  Electronically Signed     RMR/MEDQ  D:  01/13/2008  T:  01/13/2008  Job:  [id number]   cc:   Gerrit Friends. Dietrich Pates, MD, [facility]  [address]  [place], [place] [address]   Hospitalist Service

## 2010-05-19 NOTE — Assessment & Plan Note (Signed)
[name], [name]                 CHART#:  [id number]   DATE:  12/19/2007                       DOB:  12-15-1945   CHIEF COMPLAINT:  Followup of cirrhosis.   SUBJECTIVE:  The patient is here for a followup visit.  He was last seen  here back on September 05, 2007.  He has a history of NASH cirrhosis,  gastroesophageal varices, status post bleed, status post multiple  banding sessions.  Last EGD in March 2008, at [facility], no banding performed  per patient.  History of lingering gastric resolution clip for bleeding  lesion placed previously, history of anasarca, atrial fibrillation no  longer on Coumadin, history of poorly-controlled diabetes mellitus,  hypertension, small bowel Crohn disease no longer on Pentasa  (discontinued by Brooke Dare), anemia of chronic disease followup with  Dr. Glenford Peers for Aranesp injections, history of C. diff infection,  B12 deficiency, colonoscopy in February 2008, reportedly normal.   The patient was seen at [facility] couple of weeks ago.  He states they  increased his Lasix back to 40 mg daily and aldactone 50 mg daily.  These diuretics had to be decreased due to worsening creatinine and  hyperkalemia back in October.  Overall, he is feeling okay.  He notes  that he feels like the fluid is coming back on.  He started to have  some difficulties breathing.  He has a history of chronic left pleural  effusion and ascites.  He denies any abdominal pain.  He states his  appetite is great.  No nausea or vomiting.  His bowel movements are  regular.  No diarrhea or blood in the stool.  His weight is up 9 pounds  from when we saw him in September.  He states his weight is down about 7  pounds in the last 2 weeks since his diuretics were adjusted.   CURRENT MEDICATIONS:  See updated list.   ALLERGIES:  Codeine and tape.   PHYSICAL EXAMINATION:  VITAL SIGNS:  Weight 282, was 273 in September.  Temp 98, blood pressure 120/70, and pulse 68.  GENERAL:  A pleasant,  somewhat pale-appearing Caucasian gentleman in no  acute distress.  SKIN:  Warm and dry.  No jaundice.  HEENT:  Sclerae nonicteric.  Oropharyngeal mucosa moist and pink.  CHEST:  He has diminished breath sounds in the lower two-thirds of his  left chest.  Right chest, clear to auscultation.  CARDIOVASCULAR:  Regular rate and rhythm.  ABDOMEN:  Obese, easily reducible umbilical hernia.  Positive bowel  sounds.  I did not appreciate his spleen.  Abdomen is nontender.  LOWER EXTREMITIES:  He has 1 to 2+ pitting edema to knees bilaterally.   IMPRESSION AND PLAN:  The patient is a very pleasant 65 year old  gentleman with history of NASH cirrhosis.  He tells me that he is on the  inactive transplant list at [facility] because of low MELD score.  Overall, he  is doing well.  His main issue is that of left pleural effusion.  He  does have third spacing elsewhere with some ascites and peripheral lower  extremity edema.  Given that his diuretics were recently adjusted, we  will go ahead and recheck his MET-7 today.  We will make further  adjustments as we can.  We will try to retrieve recent x-ray  done  through [facility].  He will be due for other routine labs including alpha-  fetoprotein tumor marker in March 2009.  We will have him come back and  see Dr. Jena Gauss in 2 months as well.       Tana Coast, P.A.  Electronically Signed     R. Roetta Sessions, M.D.  Electronically Signed    LL/MEDQ  D:  12/19/2007  T:  12/19/2007  Job:  [id number]   cc:   Kirk Ruths, M.D.  Ladona Horns. Mariel Sleet, MD

## 2010-05-19 NOTE — Group Therapy Note (Signed)
NAMESEANMICHAEL, SALMONS                ACCOUNT NO.:  [id number]   MEDICAL RECORD NO.:  [id number]          PATIENT TYPE:  INP   LOCATION:  A313                          FACILITY:  [facility]   PHYSICIAN:  Dorris Singh, DO    DATE OF BIRTH:  January 15, 1945   DATE OF PROCEDURE:  01/16/2008  DATE OF DISCHARGE:                                 PROGRESS NOTE   The patient seen today, had family members in the room.  He is status  post his EGD.  They did band ligation of his esophageal varices which he  was found to have.  The plan was to continue the Sandostatin for the  next 24 hours.  Also, a beta blocker for twice a day to see if that will  help his heart rate, and they will follow him for tomorrow.  I suspect  that once GI clears him, the patient should be able to be discharged  back to home within the next 24-48 hours.   His temperature is 96.8, pulse 71, respirations 22, blood pressure  133/74.  GENERAL:  The patient is a pale, well-developed, well-nourished,  Caucasian male who is in no acute distress.  HEART:  Regular rate and rhythm.  LUNGS:  Clear to auscultation bilaterally.  ABDOMEN:  Is firm; however, is nontender, nondistended.  EXTREMITIES:  Positive pulses.  Patient has SCDs on.   For testing for today, he had a CBC with white count 6.0, hematocrit 10,  hemoglobin 30.8 and platelet count of 101 and his glucose is 220.   ASSESSMENT/PLAN:  1. Esophageal varices,  GI is following the patient.  We will see how      he does tomorrow.  When they feel that he is stable enough to be      discharged, we will discharge him at that point in time.  2. Chronic liver disease.  He is being followed at [facility].  3. History of Crohn disease.  He is also being followed by      gastroenterologist at [facility].  4. Chronic atrial fibrillation.  He was started on propranolol as      mentioned above.  5. Hypertension, stable.  6. Hyperlipidemia.   As mentioned before, the plan for him, we will see  what GI would like to  do and anticipate discharge in the next 24-48 hours.      Dorris Singh, DO  Electronically Signed     CB/MEDQ  D:  01/16/2008  T:  01/16/2008  Job:  [id number]

## 2010-05-19 NOTE — H&P (Signed)
[name], [name]                ACCOUNT NO.:  [id number]   MEDICAL RECORD NO.:  [id number]          PATIENT TYPE:  INP   LOCATION:  A203                          FACILITY:  [facility]   PHYSICIAN:  Skeet Latch, DO    DATE OF BIRTH:  1945/12/19   DATE OF ADMISSION:  07/26/2006  DATE OF DISCHARGE:  LH                              HISTORY & PHYSICAL   PRIMARY CARE PHYSICIAN:  Kirk Ruths, M.D.   CHIEF COMPLAINT:  Syncope.   HISTORY OF PRESENT ILLNESS:  This is a 65 year old Caucasian male who  presents with complaints of a three day history of syncope.  The patient  states that for the past three days he has had three syncopal episodes.  The patient states that three days prior he had syncopal episode after  getting up and going to the refrigerator.  He got lightheaded and dizzy  and then passed out.  The patient then states a similar episode  happened, same circumstances, getting up, going to the refrigerator, got  lightheaded, dizzy and passed out.  The patient then states the third  day he got up from watching TV, was getting ready for bed, got  lightheaded, dizzy, then passed out.  The patient denies any history of  previous syncopal episode.  States that every episode happened  approximately 4-5 hours after taking his beta blocker medication.  The  patient does have a significant medical history of cirrhosis, type 2  diabetes, chronic atrial fibrillation ,some bilateral lower extremity  weakness.   PAST MEDICAL HISTORY:  1. Cirrhosis, he is followed at [facility] for potential      transplant.  2. Ascites.  3. Type 2 diabetes.  4. Hypertension.  5. Crohn's disease.  6. Variceal bleed 2006.  7. Duodenal ulcer.  8. Chronic atrial fibrillation.  9. Hyperlipidemia.  10.Anemia of chronic disease.  He is on Aricept and vitamin B12      injections.   CURRENT MEDICATIONS:  1. Cymbalta 60 mg daily.  2. Oxycodone 5 mg one tablet q.4 h as needed.  3. Aldactone  100 mg daily.  4. Questran light powder 4 g four times a day.  5. Flomax 0.4 mg daily.  6. Lasix 40 mg daily.  7. Zocor 40 mg daily.  8. Amaryl 4 mg daily.  9. Propranolol 20 mg b.i.d.  10.Nexium 40 mg b.i.d.  11.Lanoxin 250 mcg daily.  12.NovoLog sliding scale four times a day before meals.  13.Lantus 60 units subcu at night.   ALLERGIES:  CODEINE.   SOCIAL HISTORY:  Patient is divorced, lives with his sister an brother.  Denies any tobacco, alcohol or illicit drug use.   PAST MEDICAL HISTORY:  Positive for diabetes.   REVIEW OF SYSTEMS:  Positive for chronic back pain and lower extremity  weakness.  Positive for some gas pains, left abdomen, positive for  syncopal episodes from lightheadedness and dizziness.  All other systems  are negative.   PHYSICAL EXAMINATION:  VITAL SIGNS:  Temperature 97.9, pulse 81,  respirations 20, blood pressure 131/72, saturating 99% on room air.  HEENT:  Normocephalic, atraumatic.  Eyes are PERRLA. EOMI.  NECK:  Soft, supple, nondistended.  No thyromegaly.  CARDIOVASCULAR:  Irregular rate and rhythm.  LUNGS:  Decreased breath sounds but clear to auscultation bilaterally.  No rhonchi, rales or wheezes.  ABDOMEN:  Obese, soft, nontender, nondistended, positive bowel sounds.  EXTREMITIES:  No cyanosis, clubbing or edema noted.  NEUROLOGICAL:  The patient is awake, alert and oriented x3. Cranial  nerves II-XII grossly intact.   RADIOLOGIC FINDINGS:  Chest x-ray:  1. Suspect pulmonic left pleural effusion.  2. Early right lower lobe infiltrate versus atelectasis.   CT of his head showed no intracranial abnormalities.  Also showed some  mild cortical atrophy.   LABORATORY DATA:  Hemoccult negative, hemoglobin A1C is 8.4, TSH 2.703,  digoxin level 0.6.  Sodium 136, potassium 4.9, chloride 107, CO2 24, BUN  24, creatinine 1.33.  CBC:  White count 4.1, hemoglobin 9.8, hematocrit  30.1, platelets 111.   ASSESSMENT:  This is a 65 year old Caucasian  male with multiple medical  problems who presents with a three day history of syncopal episodes.  The patient has a history of chronic atrial fibrillation, hypertension,  cirrhosis, type 2 diabetes, Crohn's disease and hyperlipidemia.  The  patient had these episodes four hours after taking his beta blocker.   PLAN:  1. The patient will be admitted with diagnosis of syncope.  Bilateral      carotid ultrasound was ordered as well as CT of his head.      Cardiology has also been following the patient for his cardiac      evaluation also.  So far, studies have been within normal limits,      possibly because he had vasovagal reaction or medication induced at      this time.  2. Hypertension.  This seems to be stable at this time.  Continue      current treatment with Cardiology following his heart rate and his      beta blocker use.  3. Type 2 diabetes.  The patient will be placed on his sliding scale      and kept on his home medications which include Lantus.  Blood      sugars were checked a.c. and h.s.  4. Cirrhosis.  The patient is being followed at [facility]      at this time.  5. Chronic atrial fibrillation.  The patient has not been on Coumadin      secondary to her previous GI bleed.  This was also being followed      by Cardiology and will defer to Cardiology regarding his atrial      fibrillation.  6. Hyperlipidemia.  The patient is on a statin.  This will continue at      this time.  The patient will continue to be followed regarding      syncope at this time.  If no cardiac findings or current studies      are positive, the patient may need a neurological follow up in the      near future.      Skeet Latch, DO  Electronically Signed     SM/MEDQ  D:  07/27/2006  T:  07/28/2006  Job:  [id number]

## 2010-05-19 NOTE — Discharge Summary (Signed)
[name], [name]                ACCOUNT NO.:  [id number]   MEDICAL RECORD NO.:  [id number]          PATIENT TYPE:  INP   LOCATION:  5127                         FACILITY:  [facility]   PHYSICIAN:  Willie Gardener, MD    DATE OF BIRTH:  17-Mar-1945   DATE OF ADMISSION:  05/17/2008  DATE OF DISCHARGE:  05/20/2008                               DISCHARGE SUMMARY   DISCHARGE DIAGNOSES:  1. Upper gastrointestinal bleed secondary to esophageal varices.  2. Esophageal varices secondary to cirrhosis.  The patient underwent      esophagogastroduodenoscopy with 2 band ligations.  3. Acute post hemorrhagic anemia secondary to esophageal varices.  4. Ascites secondary to cirrhosis.  5. Atrial fibrillation which has been rate controlled and the patient      is not a candidate for Coumadin.  6. Diabetes mellitus which was uncontrolled.  7. Hypertension that has been controlled during this hospitalization.  8. Hyperlipidemia.  9. Nonalcoholic steatohepatitis cirrhosis with multiple admissions for      esophageal varices bleeding.  10.Chronic anemia.  11.Questionable Crohn disease.   DISCHARGE MEDICATIONS:  New medications:  1. Lantus 70 units subcutaneously at bedtime.  2. NovoLog 3 units 3 times daily with meals.   Old medications include:  1. Simvastatin 40 mg p.o. once a day.  2. Aldactone 100 mg p.o. once a day.  3. Cymbalta 60 mg p.o. once a day.  4. Propranolol 20 mg twice daily.  5. Omeprazole 20 mg twice daily.  6. Lasix 40 mg once a day.  7. Lanoxin 0.25 mg once a day.  8. Zinc sulfate 220 mg twice daily.   CONSULTATIONS:  GI consult.   PROCEDURES:  EGD done on May 17, 2008, by Dr. Yancey Flemings, and showed  grade 2 varices in the distal esophagus with portal hypertensive  gastropathy in total stomach and retroflexion exam demonstrated  prominent folds.  The patient actually was treated with band ligations  x2.   FOLLOWUP:  The patient will follow with his gastroenterologist, Dr.  Roetta Sessions in 2-3 weeks.   COURSE OF HOSPITALIZATION:  This is a 64 year old male who is known for  cirrhosis that complicated with portal hypertension, esophageal varices,  and ascites, who presented to the hospital with complaint of GI bleed  and melena.  The patient was admitted to the hospital for further  evaluation.  His hemoglobin was found to be in the low side.  He was  transfused 2 units of packed RBC.  Hemoglobin remained stable post  transfusion.  Initially, he was monitored in the step-down unit.  He was  started on octreotide drip in addition to a Protonix drip.  GI  consultation was provided by [facility].  EGD was done by Dr. Yancey Flemings  on May 17, 2008, and detailed results were mentioned above.  The patient  had band ligation x2 during his EGD.  The patient underwent abdominal x-  ray on May 16, 2008, that showed nonobstructive bowel gas pattern and  showed a small to moderate left pleural effusion.  Ultrasound-guided  paracentesis was done because of  his ascites and 3.9 L of fluids were  discontinued.  Currently, at the time of discharge, the patient is  totally back to normal.  He does not have any more bleeding, does not  have any abdominal pain, no nausea, and no vomiting.  Exam is positive  still for distended abdomen, but much better than when he came in.  He  does not have any tenderness.  He will be discharged in all preadmission  medications.  I advised him to follow with his gastroenterologist for  possible titration of his diuretics that include Lasix and Aldactone.   During this hospitalization, blood sugar has been elevated in the range  of 200-300, and going back to his history, he said his blood sugar has  been all the time in the 200-300 range.  I increased his Lantus to 70  units from 60 units to be taken at bedtime and I added NovoLog to be  taken 3 units 3 times a day with meals.  He will also continue other  coverage with sliding scale.  I  encouraged him to check his blood sugar  at home and take those readings to his primary doctor for further  adjustment of his medications.   Otherwise, other medical conditions remained stable during this  hospitalization.  The patient will be discharged on the above-mentioned  medications in a stable condition.   Total assessment time is 40 minutes.      Willie Gardener, MD  Electronically Signed     NAE/MEDQ  D:  05/20/2008  T:  05/21/2008  Job:  [id number]

## 2010-05-19 NOTE — Letter (Signed)
July 26, 2006    Kirk Ruths, M.D.  [address]  [place], [place] [address]   RE:  [name], [name]  MRN:  [id number]  /  DOB:  [birth year]   Dear Annette Stable:   [name] return to the office for a scheduled visit five months beyond  his anticipated return.  As you know, he suffered a fall on ice and a  spinal fracture.  This was initially treated conservatively at [facility] after a variceal bleed occurred while he was awaiting  surgery.  He was subsequently discharged to home, but obtained a second  opinion at [facility], where he underwent implantation to a rod to  stabilize his spine.  He spent a few months in [place] and has  gradually recovered at home since.  He continues to walk with a walker  but has not had much pain.  He incidentally reports that he has had  syncopal spells over the past three days.  He rose from a chair, felt a  little bit dizzy, went to the kitchen and found himself on the floor.  He did that on two occasions.  On the third, he felt like he would lose  consciousness but sunk to his knees and did not totally pass out.  He  has had some orthostatic symptoms, but these have not been prominent.  He notes no recent intercurrent illness.  His p.o. intake has been  normal.  There has been no change in his medications.  These include  Cymbalta 60 mg daily, Aldactone 100 mg daily, Questran powder q.i.d.,  Flomax 0.4 mg daily, furosemide 40 mg daily, simvastatin 40 mg daily,  Amaryl 4 mg daily, propranolol 20 mg b.i.d., Nexium 40 mg b.i.d., Lantus  insulin 60 units nightly, digoxin 0.25 mg daily.   PHYSICAL EXAMINATION:  The blood pressure is 115/60 lying and falls to  100/60 when standing without symptoms.  Heart rate 80 and irregular.  Respirations 18.  No jugular venous distention noted; normal carotid upstrokes without  bruits.  LUNGS:  Clear.  CARDIAC:  Normal first and second heart sounds; irregular rhythm.  ABDOMEN:  Soft and nontender;  no masses; no organomegaly.  EXTREMITIES:  Ankle edema 1/2+; normal distal pulses.   EKG:  No acute changes.   IMPRESSION:  [name] has no apparent cause for syncope.  Clearly,  falls are of concern, especially in light of his history of fractured  vertebrae and spinal instability.  We will arrange for an observation  stay in the hospital to rule out other causes of syncope.  Flomax will  be discontinued for now.    Sincerely,      Gerrit Friends. Dietrich Pates, MD, [facility]  Electronically Signed    RMR/MedQ  DD: 07/26/2006  DT: 07/27/2006  Job #: [id number]

## 2010-05-19 NOTE — Letter (Signed)
November 08, 2006    Dr. Kirk Ruths, M.D.  [address]  [place], [place]  [address]   RE:  [name], [name]  MRN:  [id number]  /  DOB:  [birth year]   Dear Annette Stable:   [name] returns to the office for continued assessment and treatment  of atrial arrhythmias.  Since his last visit he has done fairly well  from a cardiovascular standpoint.  He did experience one episode of  dizziness and near syncope during which he slid down the wall to his  knees.  He had minimal premonitory dizziness.  Otherwise, he has felt  generally well.  He was seen in the emergency department in August for  anxiety and dyspnea.  Evaluation found no new abnormalities.  He had a  stable mild anemia and normal renal function.  He was discharged without  any specific therapy.   PHYSICAL EXAMINATION:  GENERAL:  Calm and pleasant gentleman in no acute  distress.  VITAL SIGNS:  Weight 265 5 pounds more than in August, blood pressure  145/80, heart rate 76 and irregular, respirations 16.  NECK:  No jugular venous distention; normal carotid upstrokes without  bruits.  LUNGS:  Dullness to percussion and decreased breath sounds at the left  base.  CARDIAC:  Irregular rhythm; normal first and second heart sounds.  ABDOMEN:  Soft and nontender; no organomegaly appreciated.  EXTREMITIES:  1/2+ ankle edema.   IMPRESSION:  [name] remained stable from a cardiovascular  standpoint.  Although he has atrial fibrillation and diabetes, his risk  of full anticoagulation in the setting of known varices is excessive  relative to the benefit.  With a history of duodenal ulcer even aspirin  is probably not  warranted.  Due to findings on his lung exam a chest x-ray will be  repeated.  If results are good I will plan to see this nice gentleman  again in six months.  He plans to obtain influenza vaccine when he gets  over his current upper respiratory infection symptoms.  He is scheduled  to be seen in hepatology clinic at  [facility] in the near future.    Sincerely,      Gerrit Friends. Dietrich Pates, MD, [facility]  Electronically Signed    RMR/MedQ  DD: 11/08/2006  DT: 11/09/2006  Job #: [id number]

## 2010-05-19 NOTE — Group Therapy Note (Signed)
NAMEEGON, [name]                ACCOUNT NO.:  [id number]   MEDICAL RECORD NO.:  [id number]          PATIENT TYPE:  INP   LOCATION:  A301                          FACILITY:  [facility]   PHYSICIAN:  Dorris Singh, DO    DATE OF BIRTH:  22-Jan-1945   DATE OF PROCEDURE:  DATE OF DISCHARGE:                                 PROGRESS NOTE   The patient was admitted for exacerbation of COPD, states that he is  doing better.  Has had no complaints today.  Originally he presented to  the [facility] with worsening dyspnea.  Also has a  history of cirrhosis with chronic ascites, hypertension, Crohn disease  and chronic A-Fib.  Patient was continued and has been continued on O2  as well as breathing treatments.  GI has seen him today, their note is  appreciated.  They will go ahead and continue to monitor with serial H  and H's and they recommend that his pleural effusions be treated with  Lasix instead of thoracocentesis.  Will continue to monitor him and  change therapy as necessary.   His vitals for today are as follows:  VITALS:  Temperature 97.8.  Pulse 78.  Respirations 24.  Blood pressure  119/54.  GENERAL:  The patient is a 61-year Caucasian male who is well-developed,  well-nourished, in no distress.  HEART:  Regular rate and rhythm.  LUNGS:  Decreased breath sounds bilaterally, right greater than left.  No rhonchi or wheezing.  ABDOMEN:  Round and nonpainful, no tenderness.  Positive bowel sounds.  EXTREMITIES:  Positive +1 pitting edema.   LABS:  For today, his white count is 7.3, hemoglobin 8.1, hematocrit  26.3, platelet count of 107.  His INR is 1.2, sodium is 136 potassium  4.5, chloride 104, CO2 is 26, glucose 188, BUN 23, creatinine 1.16.  Magnesium is within normal limits, and his digoxin level is 0.26.   ASSESSMENT/PLAN:  1. Dyspnea.  This is improving.  We will go ahead and start Lasix      therapy per recommendations from gastrointestinal.  They are also     monitoring his serial H and H's.  2. Type 2 diabetes.  Will continue with his home medication and also      with a sliding scale.  3. Chronic atrial fibrillation.  The patient's Lanoxin level is low.      We will go ahead and consult gastrointestinal to see him and will      continue to monitor the patient, anticipate discharge in 1-2 days.      Dorris Singh, DO  Electronically Signed     CB/MEDQ  D:  07/04/2007  T:  07/04/2007  Job:  [id number]

## 2010-05-19 NOTE — H&P (Signed)
NAMESLY, [name]                ACCOUNT NO.:  [id number]   MEDICAL RECORD NO.:  [id number]          PATIENT TYPE:  INP   LOCATION:  A301                          FACILITY:  [facility]   PHYSICIAN:  Skeet Latch, DO    DATE OF BIRTH:  1945-11-20   DATE OF ADMISSION:  07/03/2007  DATE OF DISCHARGE:  LH                              HISTORY & PHYSICAL   PRIMARY CARE PHYSICIAN:  Patrica Duel, M.D.   CHIEF COMPLAINT:  Shortness of breath.   HISTORY OF PRESENT ILLNESS:  This is a 65 year old Caucasian male who  presents with dyspnea.  The patient states he has had worsening dyspnea  for the last four or five days, today being the worst.  The patient has  history of cirrhosis as well as chronic ascites. The patient did have  some ultrasound-guided left thoracentesis back in April 2009.  The  patient states he has done fairly well since that thoracentesis but  states that over the last few days his breathing has gotten  progressively worse.  He had some chest tightness associated with  dyspnea today.  The patient went to his primary care physician's office  to be evaluated and then was sent to the emergency room for treatment.   PAST MEDICAL HISTORY:  1. Cirrhosis followed by [facility] and states that he is      on the transplant list.  2. Ascites.  3. Type 2 diabetes.  4. Hypertension.  5. Crohn's disease.  6. Variceal bleeding in 2006.  7. Duodenal ulcer.  8. Chronic atrial fibrillation.  9. Hyperlipidemia.  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   CURRENT MEDICATIONS:  1. Lanoxin 250 mcg twice a day.  2. Propranolol 20 mg twice a day.  3. Zocor 40 mg once daily.  4. Cymbalta 60 mg once a day.  5. Lantus 60 units subcu at bedtime.  6. Oxycodone/acetaminophen 5 mg q.4 h. p.r.n. as needed.  7. Aldactone 100 mg once a day.  8. Flomax 0.4 mg once a day.  9. Amaryl 4 mg once a day.  10.Nexium 40 mg twice a day.  11.NovoLog sliding scale.  12.Questran 4 mg every four  hours.   PAST SURGICAL HISTORY:  Cholecystectomy. .   ALLERGIES:  CODEINE.   SOCIAL HISTORY:  Divorced.  Lives with sister and brother.  Denies any  tobacco, alcohol or illicit drug use.   REVIEW OF SYSTEMS:  Positive for chronic back pain, lower extremity  weakness.  Positive for dyspnea.  He has chronic ascites.   PHYSICAL EXAMINATION:  VITAL SIGNS:  Temperature 96.9, pulse 75,  respirations 20, blood pressure 135/66, oxygen saturation 88% on room  air.  GENERAL:  He is awake and alert.  He is well-developed, well-nourished,  well-hydrated, in no acute distress.  HEENT:  Head is atraumatic, normocephalic.  PERRL.  EOMI.  NECK:  Soft, supple, nontender, nondistended.  No thyromegaly, no JVD.  CARDIOVASCULAR:  Regular rate and rhythm.  No murmurs, rubs or gallops.  LUNGS:  Decreased breath sounds bilaterally, right greater than left.  No rhonchi or wheezing.  ABDOMEN:  Obese, slightly distended.  Positive bowel sounds.  No rigidity  or guarding.  No tenderness on deep palpation.  EXTREMITIES:  He has some trace edema bilateral lower extremities with  chronic stasis changes noted.  NEUROLOGIC:  He is awake and alert x3.  Cranial nerves II-XII grossly  intact.   LABORATORY DATA:  BNP 66.3.  D-dimer 2.81.  Digoxin levels 0.6.  Sodium  136, potassium 5, chloride 105, CO2 27, glucose 231, BUN 23, creatinine  1.27.  White count is 5.9, hemoglobin 6.2, hematocrit 31.5, platelet  count 140,000.  CK-MB 5, troponin less than 0.5.   Radiologic studies:  Chest x-ray shows moderate to large left effusion  and left lower lobe atelectasis, stable.  CT/angiogram of the chest:  1. Marked left pleural effusion with associated compressive      atelectasis.  2. Stable changes of cirrhosis with ascites and splenomegaly.  3. Pulmonary embolus.   ASSESSMENT:  1. Dyspnea.  2. History of cirrhosis with chronic ascites.  3. History of hypertension.  4. History of Crohn's disease.  5. History of  chronic atrial fibrillation.   PLAN:  1. The patient admitted to the service of [facility].  2. Dyspnea.  Will continue the patient on oxygen as needed and assess      the saturation above 90%.  The patient has history of thoracentesis      in the past.  Will get a GI consult to see if this due to ascites      or if the patient may need radiology to do a thoracentesis at this      time.  At this time the patient seems to be stable and is now able      to breathe at this time.  3. Type 2 diabetes.  The patient will be placed on his home      medications.  He is also to be placed on a resistant      sliding scale.  Blood sugars should be checked a.c. and h.s. as      well as night coverage.  4. Chronic atrial fibrillation.  The patient is on Lanoxin.  Will      continue that at this time.  5. The patient will be on DVT and GI prophylaxis.      Skeet Latch, DO  Electronically Signed     SM/MEDQ  D:  07/04/2007  T:  07/04/2007  Job:  [id number]   cc:   Patrica Duel, M.D.  Fax: [phone]

## 2010-05-19 NOTE — Assessment & Plan Note (Signed)
[facility]                                 ON-CALL NOTE   [name], [name]                       MRN:          [id number]  DATE:08/27/2006                            DOB:          [birth year]    PRIMARY CARDIOLOGIST:  Doylene Canning. Ladona Ridgel, MD  and Gerrit Friends. Dietrich Pates, MD,  [facility]   PRIMARY CARE PHYSICIAN:  Kirk Ruths, M.D.   SUMMARYTammy [name] from [employer] called me stating that abnormal EKG.  However, on conferring with [name], the patient stated that the monitor  was placed because of collapse/syncope. He was calling stating that the  patient was in new onset atrial fibrillation. However, on review of  office E-chart note, the patient has chronic atrial fibrillation. After  discussing this with [name] and his review, he states that yes the patient  is in chronic atrial fibrillation and he has not had any significant  events.      Joellyn Rued, PA-C  Electronically Signed      Noralyn Pick. Eden Emms, MD, [facility]  Electronically Signed   EW/MedQ  DD: 08/27/2006  DT: 08/28/2006  Job #: [id number]

## 2010-05-19 NOTE — Letter (Signed)
August 05, 2006    Kirk Ruths, M.D.  [address]  [place], [place] [address]   RE:  [name], [name]  MRN:  [id number]  /  DOB:  28-Feb-1945   Dear Annette Stable:   [name] returns to the office following a recent brief  hospitalization instigated by me. Workup revealed no etiology for his  syncope. He did not experience any events while in the hospital.  Unfortunately, he is once again experiencing daily episodes at home. He  ended up under his kitchen table the other day. These typically occur  late in the evening just before he goes to bed. He gets brief promontory  symptoms and then falls. He has not had time to sit down before his  apparent loss of consciousness. He was wearing a Holter monitor during a  dizzy spell without a fall. There was no arrhythmia identified other  than his usual atrial fibrillation. He has had an EEG, but we cannot  locate the report. He has no follow up scheduled with Dr. Gerilyn Pilgrim, who  saw him in the hospital.   PHYSICAL EXAMINATION:  GENERAL:  Pleasant, chronically ill appearing  [name] in no acute distress.  VITAL SIGNS:  Weight 252, 3 pounds less than 10 days ago. Blood pressure  120/60 without significant orthostatic change. Heart rate 66 and  irregular.  NECK:  No jugular venous distension; no carotid bruits.  LUNGS:  Clear.  CARDIAC:  Normal first and second heart sounds; grade 2/6 basilar  systolic ejection murmur.  EXTREMITIES:  Trace edema.   IMPRESSION:  [name] continues to have frequent falls, apparently  representing syncopal spells, without apparent etiology. I am somewhat  at a loss as to what to do. Overdiuresis is certainly a consideration,  but he has not had a pre-renal pattern of BUN and creatinine, and he is  not orthostatic. Nonetheless, that is the only option I see at present.  We will decrease  Aldactone to 50 mg daily. I will ask one of our electrophysiologists to  provide a second opinion regarding evaluation of  these spells. I will  see [name] again in two weeks. He is living with his  brothers family, which provides him some assistance. They will attempt  to watch him as closely as possible and to assist should he require it.    Sincerely,      Gerrit Friends. Dietrich Pates, MD, [facility]  Electronically Signed    RMR/MedQ  DD: 08/05/2006  DT: 08/06/2006  Job #: [id number]

## 2010-05-19 NOTE — H&P (Signed)
NAMECLARA, [name]                ACCOUNT NO.:  [id number]   MEDICAL RECORD NO.:  [id number]          PATIENT TYPE:  INP   LOCATION:  3105                         FACILITY:  [facility]   PHYSICIAN:  Sabino Donovan, MD        DATE OF BIRTH:  [birth year]   DATE OF ADMISSION:  05/17/2008  DATE OF DISCHARGE:                              HISTORY & PHYSICAL   CHIEF COMPLAINT:  Dark stool.   HISTORY OF PRESENT ILLNESS:  The patient is a 65 year old white male  with history of nonalcoholic steatotic hepatitis or NASH, atrial  fibrillation, colons disease, diabetes, hypertension, history of  esophageal varices, status post banding who presented with complain of  dark stool.  The patient has had a history of esophageal variceal  bleeding and has had dark stools in the past and reports that today  noted the his stool is a little bit darker than usual, although not  melanotic other then that he denies any hematemesis.  Reports stomach  bloating and belching, but denies any nausea or vomiting.  Denies any  diarrhea or constipation.  Denies any chest pain, shortness of breath.  He presented to [facility] and was transferred here for further  care.   PAST MEDICAL HISTORY:  1. Atrial fibrillation.  2. Cirrhosis.  3. Crohn disease.  4. Diabetes.  5. Hypertension.  6. History of esophageal variceal bleeding, status post banding in      March 2010.   FAMILY HISTORY:  Noncontributory.   SOCIAL HISTORY:  Negative x3.   DRUG ALLERGIES:  He is allergic to CODEINE makes him paralyze.   MEDICATIONS:  The patient was on;  1. Lanoxin 250 mcg p.o. daily.  2. Propranolol 60 mg p.o. b.i.d.  3. Simvastatin 40 mg p.o. daily.  4. Cymbalta 60 mg p.o. daily.  5. Lantus 6 units at bedtime.  6. NovoLog sliding scale.  7. Lasix 40 mg p.o. daily.  8. Spironolactone 100 mg p.o. daily.  9. Omeprazole 20 mg p.o. daily.   REVIEW OF SYMPTOMS:  Otherwise, unremarkable.   PHYSICAL EXAMINATION:  VITAL SIGNS:   Temperature 98, pulse 82,  respiratory rate 22, and blood pressure 133/59.  GENERAL:  In no acute distress.  HEENT:  PERRLA.  EOMI.  NECK:  No lymphadenopathy, thyromegaly.  No JVD.  CHEST:  Largely clear to auscultation bilaterally, but decreased breath  sounds in his left lower lobe.  CARDIOVASCULAR:  Regular rate and rhythm.  No murmurs, rubs, or gallops.  ABDOMEN:  Soft, nontender, nondistended.  Normoactive bowel sounds.  He  has bilateral flank dullness to percussion.  EXTREMITIES:  Trace pitting edema, otherwise no clubbing or cyanosis.  NEUROLOGIC:  Focally intact.   LABORATORY DATA:  Sodium 131, potassium 3.9, BUN 23, creatinine 1.5.  White count 5.1, H&H 8.8 and 26.2, platelets 97.  Troponin less than  0.05.  Digoxin 0.28, INR 1.2, PT 16.  Lactic acid level was 2.6, lipase  32.  Chest x-ray shows mild-to-moderate left pleural effusion.  EKG  shows atrial fibrillation.   IMPRESSION:  The patient is a 65 year old  white male with multiple  medical problems now admitted with dark stool concerning for esophageal  variceal bleeding.   PROBLEMS:  1. Anemia, dark stool.  The patient has a history of esophageal      variceal bleeding, now has a similar presentation.  At this time,      we will start the patient on Octreotide and Protonix drip, and give      IV fluids, transfused with 2 units of packed red blood cells and      follow response.  We will  also consult Gastroenterology for      possible EGD and variceal banding if needed.  2. Atrial fibrillation.  Currently rate controlled.  He does not      qualify for anticoagulation.  3. Diabetes.  Continue Lantus and sliding scale.  4. Hypertension.  We will hold on antihypertensive medication for now.  5. Prophylaxis.  PPI drugs.      Sabino Donovan, MD  Electronically Signed     MJ/MEDQ  D:  05/17/2008  T:  05/17/2008  Job:  [id number]

## 2010-05-19 NOTE — Discharge Summary (Signed)
NAMEBRENEN, BEIGEL                ACCOUNT NO.:  [id number]   MEDICAL RECORD NO.:  [id number]          PATIENT TYPE:  INP   LOCATION:  A301                          FACILITY:  [facility]   PHYSICIAN:  Lucita Ferrara, MD         DATE OF BIRTH:  05-06-1945   DATE OF ADMISSION:  07/03/2007  DATE OF DISCHARGE:  07/02/2009LH                               DISCHARGE SUMMARY   DISCHARGE DIAGNOSIS:  1. Dyspnea thought to be multifactorial in etiology secondary to      ascites, pleural effusion and secondary to cirrhosis of the liver.  2. Chronic atrial fibrillation, good rate control.  Not a Coumadin      candidate per Cardiology.  3. 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  4. Type 2 diabetes.  5. Anemia, microcytic in nature.   CONSULTANTS:  1. Dr. Jena Gauss from Gastroenterology.  2. Dr. Dietrich Pates from Cardiology.   PROCEDURES:  The patient had a CT angio July 03, 2007 which showed  stable left pleural effusion with associated compression atelectasis,  stable changes of cirrhosis with ascites and splenomegaly.  The patient  had a chest x-ray July 03, 2007 which showed moderate to large left  effusion and left lower lobe atelectasis, stable.   BRIEF HISTORY OF PRESENT ILLNESS AND HOSPITAL COURSE:  Patient is a 65-  year-old who presented with dyspnea that had been progressively getting  worse four to five days prior to admission.  On July 03, 2007 he  apparently had gotten a thoracentesis fairly recently and he experienced  a lot of chest tightness.  Apparently the patient follows up with [facility] for his chronic ascites and he is on currently on a  transplant list.  He was admitted to medical floor and Gastroenterology  was consulted. He was placed on all his medications.  Also Cardiology  was consulted, Dr. Eden Emms, it was his impression that the patient's not  on Coumadin control.  It was his impression that dyspnea was secondary  to chronic left pleural effusion.   There was question whether the  patient would benefit from DVTS and sclerotherapy.   DISCHARGE MEDICATIONS:  Will be as follows:  1. Digoxin 0.25 mg p.o. daily.  2. Cymbalta 60 mg p.o. daily.  3. Lasix 40 mg p.o. daily.  4. Amaryl 4 mg p.o. daily.  5. The patient is to continue NovoLog sliding scale insulin,      instructions given.  6. Lantus 60 units subcutaneous daily.  7. Protonix 40 mg p.o. b.i.d.  8. Propranolol 20 mg p.o. b.i.d.  9. Zocor 40 mg p.o. daily.  10.Aldactone 100 mg p.o. daily.  11.Flomax 0.4 mg p.o. daily.  12.Patient is due to stop the Questran per our recommendations and per      Gastroenterology recommendations.   DISCHARGE LEVEL OF ACTIVITY:  The patient is advised to increase  activity slowly.  Call with any chest pain, shortness of breath, falls,  dizziness.  The patient is to resume a low-salt diet, avoid straining.  Appointment with primary care doctor to be made  and to be put in the  chart.   DISCHARGE CONDITION:  Stable.      Lucita Ferrara, MD  Electronically Signed     RR/MEDQ  D:  07/06/2007  T:  07/06/2007  Job:  [id number]   cc:   Patrica Duel, M.D.  Fax: [phone]

## 2010-05-19 NOTE — Assessment & Plan Note (Signed)
[name], [name]                 CHART#:  [id number]   DATE:  03/22/2007                       DOB:  11-17-1945   CHIEF COMPLAINT:  Followup of ascites, shortness of breath.   SUBJECTIVE:  The patient is here for a followup visit.  He was last seen  on 03/03/2007 by Lorenza Burton, nurse practitioner.  He has a history of  NASLD cirrhosis and ascites with a recent left pleural effusion  requiring thoracentesis and abdominal paracentesis.  He has not had his  followup chest x-ray.  Apparently this never got scheduled.  His last  chest x-ray was done the day of his thoracentesis.  He states his  breathing has improved, but he still has dyspnea on exertion.  If he  lies down flat, he starts to cough and has dry hacking cough.  He states  he got the greatest relief with his shortness of breath after he had his  abdominal paracentesis.  They were able to get about 1700 cc out.  His  weight is actually just two pounds shy of his weight last time he was  here.  He said he really does not feel like he has gained any more fluid  around his abdomen however.  His labs from 03/03/2007 revealed a  hemoglobin of 10.4, which is able.  Platelet count 132,000.  His albumin  was 2.8.  Otherwise LFTs were normal.  His INR was 1.1.  His ascitic  fluid cytology was benign.  His cell count with diff revealed 710 WBCs  with 19% segmented neutrophils.  Acid fast smear was negative.  Culture  pending.  Acidic fluid cultures and gram stain were negative.   He brings a picture of his recent endoscopy done down in [facility] where they  were concerned about a foreign body down in his stomach.  They actually  recommended a CT, which has not been done.  The picture he shows me  today shows what looks to be consistent with a resolution clip, which  may have been placed when he was in the hospital back in January of 2008  at [facility] when he had an upper GI bleed.  We are trying to obtain those  records to verify this.  I  have made him aware that he should not have  an MRI as long as this clip remains in place.  He also tells me that he  does not feel like he has been relisted on the transplant list and does  not know when his followup is at [facility].   CURRENT MEDICATIONS:  See updated list.   ALLERGIES:  Codeine.   PHYSICAL EXAMINATION:  Weight 270 pounds, down 2 pounds.  Temperature  97.2, blood pressure 190/90, pulse 85.  GENERAL:  Pleasant, somewhat pale appearing, Caucasian male in no acute  distress.  He is alert and oriented.  SKIN:  Warm and dry.  No jaundice.  HEENT:  Sclerae nonicteric.  LUNGS:  He has diminished breath sounds in the left base.  Otherwise  unremarkable.  CARDIAC:  Reveals regular rate and rhythm.  ABDOMEN:  Positive bowel sounds.  Large, but very soft, nontender.  Did  not appreciate any significant fluid.  He has a large easily reducible  umbilical hernia which is nontender.  LOWER EXTREMITIES: Really trace edema bilaterally.  IMPRESSION:  The patient is a 65 year old gentleman with a history of  NASLD cirrhosis with ascites and recent left pleural effusion with  increasing shortness of breath.  The shortness of breath persists, but  mostly on dyspnea on exertion.  He has had some improvement with recent  abdominal paracentesis.  He still has pleural effusion based on exam.  We will go ahead and get the followup chest x-ray as previously planned.  He also has a foreign body on EGD recently on 03/07/2007.  It was on a  prior EGD as well done at [facility].  This appears to be a resolution clip,  which may have been placed at time of his EGD in January of 2008 for  bleeding.  We will verify this when we get the records.  I do not think  this is anything for him to be concerned and definitely does not need to  be surgically removed as long as we can prove that this is a clip.  His  Crohn's disease appears to be well controlled at this time.   PLAN:  1. Chest x-ray.  2. Obtain  operative notes as above.  3. Further recommendations to follow.   Addendum:  Reviewed records:  EGD 02/10/06 Dr. Arlyce Dice placed a clip in  gastric cardia secondary to bleeding.  Also placed 3 bands for gastric  bleeding.  Grade 2 esphogeal varices not bleeding.  EGD 02/11/06 clip  still in place in stomach and 3 bands noted with gastric varies.  4  bands placed for esophageal varices this time graded a 4.  Colonoscopy  02/10/06 large amount of black liquid stool throughout entire colon.  Colonic mucosa appeared normal.       Tana Coast, P.A.  Electronically Signed     R. Roetta Sessions, M.D.  Electronically Signed    LL/MEDQ  D:  03/22/2007  T:  03/22/2007  Job:  [id number]   cc:   Kirk Ruths, M.D.

## 2010-05-19 NOTE — Discharge Summary (Signed)
NAMEAKEEL, REFFNER                ACCOUNT NO.:  [id number]   MEDICAL RECORD NO.:  [id number]          PATIENT TYPE:  INP   LOCATION:  A301                          FACILITY:  [facility]   PHYSICIAN:  Lucita Ferrara, MD         DATE OF BIRTH:  21-Mar-1945   DATE OF ADMISSION:  07/03/2007  DATE OF DISCHARGE:  LH                               DISCHARGE SUMMARY   ADDENDUM:  Patient is to also continue Aranesp every 2-3 weeks, per  cardiology recommendations.      Lucita Ferrara, MD  Electronically Signed     RR/MEDQ  D:  07/06/2007  T:  07/06/2007  Job:  [id number]

## 2010-05-19 NOTE — Discharge Summary (Signed)
[name], [name]                ACCOUNT NO.:  [id number]   MEDICAL RECORD NO.:  [id number]          PATIENT TYPE:  INP   LOCATION:  A319                          FACILITY:  [facility]   PHYSICIAN:  Skeet Latch, DO    DATE OF BIRTH:  23-Apr-1945   DATE OF ADMISSION:  01/21/2008  DATE OF DISCHARGE:  01/21/2010LH                               DISCHARGE SUMMARY   PRIMARY CARE PHYSICIAN:  Kirk Ruths, MD   DISCHARGE DIAGNOSES:  1. Gastrointestinal bleed.  2. History of nonalcoholic steatohepatitis cirrhosis with esophageal      varices.  3. Ascites.  4. Cirrhosis.  5. Type 2 diabetes.  6. Hypertension.  7. Crohn disease.  8. Duodenal ulcer.  9. Chronic atrial fibrillation.  10.Hyperlipidemia.  11.History of maxillary sinus disease.   BRIEF HOSPITAL COURSE:  A 65 year old Caucasian male who presented with  1-day history of dark stools.  The patient was recently discharged from  [facility] in January 13, 2008 for similar problems.  At that  time, the patient was found to have esophageal varices that were banded  and the patient was discharged.  The patient returned stating that he  has had returning of dark stools, and complaining of some nausea, but no  significant abdominal pain.  The patient was seen in the emergency room,  was thecal positive, and was admitted to [facility].  Initial  labs showed sodium 131, potassium 3.9, chloride 98, CO2 of 26, glucose  365, BUN 24, creatinine 1.34.  He had a white count of 6300, hemoglobin  11.1, hematocrit 28.7, platelet count of 109,000.  Initial abdominal x-  ray showed cardiomegaly and persistent left lower pleural effusion and  left basilar atelectasis, and benign bowel gas pattern.  The patient was  started on octreotide and Protonix drip and was transfused packed red  blood cells.  For his all the chronic condition, the patient was placed  on his home meds.  Gastroenterology was consulted again.  The patient  had an EGD performed that showed two eschars in the distal esophagus  overlying at the location of previously placed bands.  I decompressed  the esophageal varices, grade 1-2 full columns, with no bleeding noted.  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  No free bleeding lesions were seen, showed diffusely  friable gastric mucosa, patent pylorus, friable bulbar mucosa, otherwise  D1 and D2 appeared normal.  There is no evidence of any active bleeding  noted during his hospital stay.  The patient has been stable.  We  further recommended that we increase his Inderal from twice daily to 3  times a day at this time.  The patient will be continued on a proton  pump inhibitor also at this time.  The patient has been having a  diarrhea.  He will be sent home with Imodium.  The patient also had a  small bowel capsule study performed, which showed:  1. Abnormal appearance of __________ , cecum suspicious for active      Crohn disease.  There is possible fistulas  formation.  2. No evidence of high-grade bowel obstruction.  3. Abdomen and pelvic CT is recommended for further evaluation.      Ideally, that will be performed with IV contrast after barium has      evacuated the bowel.   His last chest x-ray showed progressive left effusion, atelectasis with  only a small amount of centrally aerated left lung.   MEDICATIONS ON DISCHARGE:  1. Cymbalta 60 mg daily.  2. Zocor 40 mg nightly.  3. Propranolol 20 mg 3 times a day.  4. Lantus 60 units subcu daily.  5. Niacin, he is to take his previous dose of niacin.  6. Lasix 40 mg daily.  7. Aldactone 100 mg daily.  8. NovoLog sliding scale.  9. Imodium as needed.  10.Protonix 40 mg daily.   VITALS ON DISCHARGE:  Temperature is 98.3, pulse 79, respirations 18,  blood pressure is 141/82.   LABORATORY DATA:  His LDH is 168.  Sodium 137, potassium 3.6, chloride  102, CO2 is 27, glucose 119, BUN 12, and  creatinine 1.22.  His alkaline  phosphatase is 63, AST is 21, and ALT is 14.  PT 17.3 and INR 1.4.  White count 6.0, hemoglobin 11.1, hematocrit 34.2, platelet count  110,000.   CONDITION ON DISCHARGE:  Stable.   DISPOSITION:  The patient will be discharged to home.   DISCHARGE INSTRUCTIONS:  The patient is to increase activity to  previous.  The patient is to maintain a low-salt, heart-healthy diet.  The patient is to follow up with Dr. Regino Schultze within the next 3-5 days.  The patient  is to take all medications as prescribed.  The patient is  to return if he notices any black tarry stools, abdominal pain, or any  active bleeding and/or call 911.  The patient is to follow up with Dr.  Jena Gauss from Gastroenterology in the next 3-4 weeks in the hospital.      Skeet Latch, DO  Electronically Signed     SM/MEDQ  D:  01/25/2008  T:  01/26/2008  Job:  [id number]   cc:   Kirk Ruths, M.D.  Fax: [phone]

## 2010-05-19 NOTE — Consult Note (Signed)
[name], [name]                ACCOUNT NO.:  [id number]   MEDICAL RECORD NO.:  [id number]          PATIENT TYPE:  INP   LOCATION:  A301                          FACILITY:  [facility]   PHYSICIAN:  Noralyn Pick. Eden Emms, MD, FACCDATE OF BIRTH:  1945/06/02   DATE OF CONSULTATION:  DATE OF DISCHARGE:                                 CONSULTATION   Consult for shortness of breath and AFib.   [name] is an unfortunate 65 year old patient previously seen by Dr.  Dietrich Pates and Dr. Ladona Ridgel.  He has chronic atrial fibrillation.  He is not  a Coumadin candidate due to significant cryptogenic cirrhosis, ascites,  and varices.   The patient was admitted to the hospital with worsening dyspnea over the  last 4-5 days.  He has had a fairly chronic large left pleural effusion  that has required multiple thoracenteses.  This goes as far back as I  can tell even to 2006.  His ulcer required multiple paracenteses.  There  is a question of whether or not he is still on the transplant list at  [facility] for new liver.   The patient has been in the hospital over the last 3 days, he has  improved.  His shortness of breath has decreased.  He has been diuresed  some.   In regards to his AFib, he is not having any significant palpitations  back in 2008.  There were some unexplained episodes of syncope which he  saw Dr. Ladona Ridgel for.  Monitoring showed no significant long pauses.   The patient has not had to have a pacemaker placed.   Currently, he is at his baseline.  His shortness of breath has improved.  He is not having chest pain.  There is no orthostatic symptoms.  No PND,  orthopnea, and only trace lower extremity edema.   His review of systems is otherwise negative.  In particular, he has not  had any hemoptysis or hematemesis.  There are no stool guaiacs that I  can see in the chart.   The patient's past medical history is remarkable for cryptogenic  cirrhosis.  Follow the [facility] with  history of ascites  with multiple paracenteses, type 2 diabetes, hypertension, Crohn's  disease, previous variceal bleed, no sex, last upper endoscopy in  January.  History of duodenal ulcer, chronic Afib, not a Coumadin  candidate, hyperlipidemia.   The patient's medications prior to admission include;  1. Lanoxin 0.25 mg.  2. Propranolol 20 b.i.d.  3. Zocor 40 a day.  4. Cymbalta 60 a day.  5. Lantus 60 units at bedtime.  6. Oxycodone.  7. Aldactone 100 a day.  8. Flomax 0.4 a day.  9. Amaryl 4 a day.  10.Nexium 40 a day.  11.NovoLog sliding scale and Questran.   He has had a previous cholecystectomy.  He also has a large scar in the  back of his lumbar spine.   ALLERGIES:  He is allergic to CODEINE.   The patient is divorced.  He lives with his sister and brother.  He  denies current alcohol and  tobacco abuse.  He is otherwise sedentary.   PHYSICAL EXAMINATION:  Remarkable for an obese white male in no  distress.  Temperature is 97.9, pulse is 78 and irregular in AFib,  respirations of 20, blood pressure is 100/48, and sats were 96% on room  air.  HEENT:  Unremarkable.  Neck is supple.  There is no carotid bruits.  No  lymphadenopathy, thyromegaly, or JVP elevation.  LUNGS:  Fairly marked decreased breath sounds at the left base  consistent with left pleural effusion.  There is an S1 and S2, normal  heart sounds.  PMI normal.  ABDOMEN:  Protuberant status post cholecystectomy.  Liver is not blocker  bullet at this time.  Bowel sounds positive.  No AAA.  No tenderness, no  bruit.  Distal pulses were intact.  Trace edema.  NEURO:  Nonfocal.  SKIN:  Warm and dry.  No muscular weakness.   He had a CT of the chest on June 29, which showed no evidence of PE with  a large left pleural effusion.   His current lab work is remarkable for hematocrit of 27.  BNP shows a  BUN of 22, creatinine of 1.3.  His BNP is only 60.   His protime is 1.2.   Dig level was 0.6.    IMPRESSION:  1. Chronic atrial fibrillation with good rate control.  Continue on      low-dose Inderal and digoxin, not a Coumadin candidate.  2. Dyspnea, recurrent left pleural effusion.  Repeat consideration for      thoracentesis since this is a chronic problem.  I wonder if the      patient should be seen by CVTS or Dr. Edwyna Shell for question of      sclerotherapy and a Pleurx catheter.  I will leave this up to the      primary service to decide.  3. History of cirrhosis and ascites.  Follow-up with GI.  Last      endoscopy in January apparently showed no variceal bleeding;      however, his current hematocrit is 27 and he may need to be      rechecked for this.  Continue      Aldactone and diuretics.  4. Overall, I think the patient's cardiac status is stable.  He last      saw Dr. Dietrich Pates in the clinic in November 2008 and I suspect that      he can follow up with him in the next month or two.      Noralyn Pick. Eden Emms, MD, [facility]  Electronically Signed     PCN/MEDQ  D:  07/05/2007  T:  07/05/2007  Job:  [id number]

## 2010-05-19 NOTE — Assessment & Plan Note (Signed)
[name], [name]                 CHART#:  [id number]   DATE:  02/22/2008                       DOB:  1945-11-24   PROBLEM LIST:  1. Nonalcoholic steatohepatitis (NASH) cirrhosis, recent      hospitalization for upper gastrointestinal bleed secondary to      esophageal varices status post band ligation.  Followup EEG      demonstrated marked improvement in size of varices with ulcerated      areas at the site of prior banding.  2. History of negative colonoscopy 2007 Dr. Melvia Heaps in      [place].  3. Failed capsule study of the small bowel (capsule did not leave      stomach).  4. Small bowel follow-through, recent hospitalization suggested a      possible fistula.  5. Small bowel Crohn disease.  6. Diabetes mellitus.  7. Anasarca secondary to #1.  8. History of chronic atrial fibrillation now not anticoagulated.  9. Chronic anemia followed by Dr. Mariel Sleet   HISTORY OF PRESENT ILLNESS:  Mr. Mickley has been through quite a lot  with a couple of hospitalizations recently for GI bleeding, felt to be  due to esophageal varices.  We wanted to study small intestine, but the  capsule study was not successful.  We did increase his Inderal 20 grams  t.i.d. to increase splancnic vasoconstriction, decrease his resting  heart rate.  He is tolerating a t.i.d. regimen well though he is a  little bit lightheaded from time to time when he stands up.  He has not  passed out.  He is due to see the folks down at [facility] next month.  He is  having one to two formed bowel movements daily.  No melena.  No  hematochezia.  He is not on any Crohn's therapy at this time.  He has  not had any recent blood work.   Weight is down 7 pounds since his last weight in here December 19, 2007.   CURRENT MEDICATIONS:  See updated list as regimen includes:  1. Pantoprazole 40 mg orally daily.  2. Lasix 40 grams once daily.  3. Spirolactone 50 mg once daily.   ALLERGIES:  CODEINE AND TAPE.   PHYSICAL  EXAMINATION:  GENERAL:  He is alert, conversant, no acute  distress.  He is somewhat disheveled in appearance.  VITAL SIGNS:  Weight 266, down 7 pounds, height 6 feet 1 inch,  temperature 97.5, BP 110/80, pulse 72.  SKIN:  Warm and dry.  There is no jaundice.  HEENT:  No scleral ictus.  Conjunctivae are pink.  CHEST:  Lungs are clear to auscultation.  CARDIAC:  Regular rate and rhythm.  ABDOMEN:  Somewhat obese, not tense, positive bowel sounds, soft, small  umbilical hernia.  No obvious mass or organomegaly today.  He has no  lower extremity edema.   ASSESSMENT:  Mr. Filsinger is doing fairly well at this point in time.  His  anasarca is now much better.  Clinically, he has not had any further GI  bleeding.  He is tolerating the increased dose of Inderal fairly well.  His insurance company is dictating a change from pantoprazole to  omeprazole.   He has a history of small bowel Crohn disease in apparent remission.  There is a question of fistula on  small bowel follow-through.  He is not  on Crohn's medications at this time.  We probably should follow up on  that and assess him further for fistula formation.   RECOMMENDATIONS:  1. Obtain a Chem 20 and a CBC today.  2. Will give him a prescription for omeprazole 20 grams orally daily.  3. Follow-up on CT enteroscopy when available and make further      recommendations at this point, unless he has recurrent bleeding.  I      plan to bring him back in about 18 months for repeat EGD.       Jonathon Bellows, M.D.  Electronically Signed     RMR/MEDQ  D:  02/22/2008  T:  02/22/2008  Job:  [id number]   cc:   Kirk Ruths, M.D.  Ladona Horns. Mariel Sleet, MD

## 2010-05-19 NOTE — Op Note (Signed)
NAMEHAGOP, MCCOLLAM                ACCOUNT NO.:  [id number]   MEDICAL RECORD NO.:  [id number]          PATIENT TYPE:  INP   LOCATION:  IC09                          FACILITY:  [facility]   PHYSICIAN:  R. Roetta Sessions, M.D. DATE OF BIRTH:  01/28/1945   DATE OF PROCEDURE:  01/22/2008  DATE OF DISCHARGE:                               OPERATIVE REPORT   Diagnostic EGD.   INDICATIONS FOR PROCEDURE:  A 65 year old gentleman with well-  established NASH cirrhosis and esophageal varices just admitted about a  week ago with melena and drop in hemoglobin.  He was found to have  esophageal variceal bleeding stigmata by me and underwent placement of 2  bands, bleeding resolved.  He did well until 24 hours prior to admission  when he experienced black tarry stools once again.  He presented with a  hemoglobin of 9.0 and he was transfused with 2 units of packed red blood  cells.  This brought him up to 9.7.  He has remained hemodynamically  stable in the ICU.  We saw him in consultation today and started  cefotaxime and Sandostatin.  I gave him another unit of packed red blood  cells on top of the 2 units he received last evening.  EGD under  propofol now being done with general anesthesia standby to see if we  cannot zero in on the site of bleeding and treat as appropriate.  This  approach has been discussed with Mr. Frerking at length.  Risks, benefits,  alternatives and  limitations have been reviewed and questions answered.  Please see the documentation in the medical record.   PROCEDURE NOTE:  O2 saturation, blood pressure, pulse and respirations  were monitored throughout the entire procedure.  Propofol anesthesia per  anesthesia staff.  Patient placed in the left lateral decubitus  position.  Cetacaine spray for topical pharyngeal anesthesia.   INSTRUMENT:  Pentax video chip system.   FINDINGS:  Examination of the tubular esophagus demonstrated 4 columns  of grade 1-2 esophageal varices.  There  is an eschar overlying at the  location of the previously placed bands.  Things really looked good.  His varices overall were decompressed.  Overlying mucosa appeared  normal.  There is no bleeding stigmata.  EG junction easily traversed.  Stomach:  Gastric cavity insufflated well with air.  There was a minimal  amount of residual food debris, diffuse gastric mucosa changes  consistent with portal gastropathy.  The gastric mucosa was diffusely  friable.  No discrete bleeding lesion was seen.  Pylorus was patent,  easily traversed.  Examination of the bulb and second portion revealed a  friable bulbar mucosa, otherwise mucosa of D1 and D2 appeared normal.   THERAPEUTIC/DIAGNOSTIC MANEUVERS PERFORMED:  None.   The patient tolerated the procedure well and was taken to the recovery  room in stable condition.   IMPRESSION:  1. Two eschars distal esophagus overlying at the location of      previously placed bands, decompressed esophageal varices grade 1-2,      four columns, no bleeding stigmata.  2. Diffuse gastric mucosal  changes consistent with portal gastropathy,      no free bleeding lesions seen, diffusely friable gastric mucosa,      patent pylorus, friable bulbar mucosa, otherwise D1 and D2 appeared      normal.   Discussed his last colonoscopy was just under 2 years ago done by Dr.  Arlyce Dice down in [place].  This was reported to be a normal exam.  Since he has now come back with recurrent bleeding and findings as  outlined above, I feel we should probably go ahead and look at the small  bowel.   RECOMMENDATIONS:  1. Will go ahead and move in the direction of giving small bowel      capsule study.  Given his history of Crohn disease, I think we      ought to assess small bowel patency with the Agile capsule firstly,      that will be done today, and proceed with a Given capsule study      tomorrow.  2. He has been on Inderal to decrease portal pressure, however, he has       been on 20 mg orally b.i.d. but has never really gotten adequately      blocked down, and given the fact that he had a bleeding lesion in      his stomach seen previously by Dr. Arlyce Dice down in [place], I      feel we ought to titrate his nonselective beta blockade upward to      get a good 15-20% decrease in resting heart rate as long as Mr.      Reaney tolerates this maneuver.  Therefore, while he is here we      will go ahead and increase his Inderal to 20 mg orally t.i.d. and      allow clear liquid diet, second H and H at 1800 today and then      again tomorrow morning.  Plan to image his small bowel.  3. Further recommendations to follow.      Jonathon Bellows, M.D.  Electronically Signed     RMR/MEDQ  D:  01/22/2008  T:  01/22/2008  Job:  [id number]   cc:   [facility]

## 2010-05-19 NOTE — H&P (Signed)
NAMEJAIKOB, [name]                ACCOUNT NO.:  [id number]   MEDICAL RECORD NO.:  [id number]          PATIENT TYPE:  INP   LOCATION:  A313                          FACILITY:  [facility]   PHYSICIAN:  Skeet Latch, DO    DATE OF BIRTH:  [birth year]   DATE OF ADMISSION:  01/12/2008  DATE OF DISCHARGE:  LH                              HISTORY & PHYSICAL   PRIMARY CARE PHYSICIAN:  Kirk Ruths, M.D.   CHIEF COMPLAINT:  Dark stools.   HISTORY OF PRESENT ILLNESS:  This is a 65 year old Caucasian male who  presents with dark stools.  The patient states that he had one episode  of dark, tarry stools this morning.  The patient states that he had  another bowel movement this evening that was larger and more black in  nature.  The patient has a history of cirrhosis, gastroesophageal  varices, anemia.  He has had multiple bandings for his varices and he  has history of atrial fibrillation.  The patient has been seen at this  facility several times for his cirrhosis and anemia.  The patient states  that he had no pain.  The only really complaint is he has been feeling  fatigued over the last few days. On my exam the patient is stable and  resting comfortably.   PAST MEDICAL HISTORY:  1. Ascites.  2. Cirrhosis followed by [facility].  He is on the      transplant list.  3. Type 2 diabetes.  4. Hypertension.  5. Crohn's disease.  6. Variceal bleed in 2006.  7. Duodenal ulcer.  8. Chronic atrial fibrillation.  9. Hyperlipidemia.  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   CURRENT MEDICATIONS:  1. Cymbalta 60 mg daily.  2. Zocor 40 mg at night time.  3. Propranolol 20 mg twice a day.  4. Lantus 60 units subcutaneously daily.  5. Niacin 0.25 mg daily.  6. Lasix 40 mg daily.  7. Aldactone 100 mg daily.  8. NovoLog sliding scale.   PAST SURGICAL HISTORY:  Cholecystectomy.   ALLERGIES:  CODEINE.   SOCIAL HISTORY:  Divorced.  Lives with sister and brother.  Denies any  alcohol or  tobacco.  No drug use.   REVIEW OF SYSTEMS:  Positive for fatigue and black, tarry stools.  Other  systems unremarkable.   PHYSICAL EXAMINATION:  VITAL SIGNS:  Temperature 97.9, pulse 93,  respirations 20, blood pressure 121/56.  GENERAL:  He is lethargic.  He is awake and alert.  HE is well-  developed, well-nourished and well-hydrated .  In no acute distress.  HEENT:  Head normocephalic, atraumatic.  PERRLA.  EOMI.  Sclerae  icterus.  NECK:  Supple, nontender.  No thyromegaly, no JVD.  CARDIOVASCULAR:  Regular rate and rhythm.  No rubs or gallops.  LUNGS:  Decreased, clear.  No rhonchi, rales or wheezes.  ABDOMEN:  Obese.  Positive bowel sounds.  No rigidity, guarding or  tenderness.  EXTREMITIES:  Trace edema bilateral lower extremities.  SKIN:  Pale.  No ecchymoses is noted.  NEUROLOGIC:  Cranial nerves II-XII grossly intact.  The  patient is alert  and oriented x3.   LABORATORY DATA:  Digoxin level is 0.6.  CK-MB 3.  Troponin less than  0.05.  Myoglobin is 184.  Sodium 133, potassium 5.9, chloride 100, CO2  27, glucose 434, BUN 47, creatinine 1.46, total bilirubin 0.7, alk phos  90, AST 21, ALT 19.  Total protein 6.5.  PTT 32, PT 15.5, INR 1.2.  White blood cell count 7.9, hemoglobin 8.9, hematocrit 27.6, platelet  count 116,000.   RADIOLOGY STUDIES:  Chest x-ray:  Enlarged heart.  Left lung base  density consistent with effusion, atelectasis, or infiltrate obscures  the hemidiaphragm.  Overall stable. Right lung is clear. No evidence of  pulmonary edema.   ASSESSMENT:  1. This is a 65 year old Caucasian male who presents with melena and      some fatigue.  The patient has history of ascites, cirrhosis,      chronic atrial fibrillation, diabetes, and Crohn's disease as well      as esophageal varices.  The patient presents with the above      symptoms with a hemoglobin of 8.9.  Plan:  The patient will be      admitted to [facility] to the telemetry unit.  2. Probable  gastrointestinal bleed.  Gastroenterology will be      consulted at this time.  The patient will be NPO except for      medications.  He can have ice chips at this time and we will hold      all aspirin, anticoagulants or nonsteroidal anti-inflammatory drugs      at this time also. The patient will be IV hydrated with normal      saline.  The patient will have his hemoglobin and hematocrit      checked every 8 hours until stable.  3. For his chronic atrial fibrillation, the patient is not      anticoagulated at this time. The patient will be on DVT prophylaxis      as well as GI prophylaxis.  4. Hypertension and hyperlipidemia.  The patient will be placed on his      home medications also.  5. Cirrhosis.  The patient is on a diuretic as well as Aldactone.      Will continue the patient on these medications at this time.  The      patient will be followed very closely.      Skeet Latch, DO  Electronically Signed     SM/MEDQ  D:  01/13/2008  T:  01/13/2008  Job:  [id number]   cc:   Kirk Ruths, M.D.  Fax: [phone]

## 2010-05-19 NOTE — Group Therapy Note (Signed)
NAMEZACKARIAH, [name]                ACCOUNT NO.:  [id number]   MEDICAL RECORD NO.:  [id number]          PATIENT TYPE:  INP   LOCATION:  A313                          FACILITY:  [facility]   PHYSICIAN:  Dorris Singh, DO    DATE OF BIRTH:  1945/06/20   DATE OF PROCEDURE:  01/14/2008  DATE OF DISCHARGE:                                 PROGRESS NOTE   The patient was seen today, has no complaints.  GI is following him.  They are very familiar with the patient's history and have appreciated  their notes.  The patient was transfused and started on Sandostatin  empirically and also placed on antibiotic therapy.  The patient is  scheduled for an EGD possibly today as long as he is stable so we await  for any further recommendations from GI.   PHYSICAL EXAMINATION:  VITAL SIGNS:  Temperature 97.0, pulse 74,  respirations 16, blood pressure 120/72.  GENERAL:  The patient is a pale, sickly-looking Caucasian male who is in  no acute distress.  HEART:  Regular rate and rhythm.  LUNGS:  Clear to auscultation bilaterally.  ABDOMEN:  Abdomen is firm.  Nontender, nondistended.  EXTREMITIES:  Positive pulses with SCDs on.   LABS:  White count is 5.5, hemoglobin 10.2, hematocrit 31.3, platelet  count 95.   ASSESSMENT:  1. Melena.  2. Chronic liver disease.  Cirrhosis.  3. History of Crohn's disease.  4. Chronic atrial fibrillation.  5. Hypertension.  6. Hyperlipidemia.   PLAN:  Will continue to monitor the patient's H and H.  He has been  transfused.  GI will do an EGD possibly today.  Will monitor his heart  rate.  He currently is anticoagulated so he is on medications and will  follow him and make any changes necessary.      Dorris Singh, DO  Electronically Signed     CB/MEDQ  D:  01/14/2008  T:  01/14/2008  Job:  [id number]

## 2010-05-19 NOTE — H&P (Signed)
[name], [name]                ACCOUNT NO.:  [id number]   MEDICAL RECORD NO.:  [id number]          PATIENT TYPE:  INP   LOCATION:  IC09                          FACILITY:  [facility]   PHYSICIAN:  Willie Latch, DO    DATE OF BIRTH:  1945/09/09   DATE OF ADMISSION:  01/21/2008  DATE OF DISCHARGE:  LH                              HISTORY & PHYSICAL   PRIMARY CARE PHYSICIAN:  [name], M.D.   CHIEF COMPLAINT:  Dark stools.   HISTORY OF PRESENT ILLNESS:  This is a 65 year old Caucasian male who  presents with a 1-day history of dark stools.  The patient was recently  seen at [facility] on January 13, 2008 for melena.  The patient  was seen by gastroenterology.  He was found to have esophageal varices  and had band ligation performed.  The patient was recently discharged  approximately 5 days ago in stable condition.  He returns stating that  his dark stools have returned.  The patient is complaining of some  nausea, but no significant abdominal pain.  The patient is lying in the  bed and appears to be in stable condition at this time.   PAST MEDICAL HISTORY:  1. Esophageal varices.  2. Ascites.  3. Cirrhosis.  He is being followed by Anesthesiology Pathology      Clinic.  He is on a transplant list.  4. Type 2 diabetes.  5. Hypertension.  6. Crohn's disease.  7. Duodenal ulcer.  8. Chronic atrial fibrillation.  9. Hyperlipidemia.  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   DRUG ALLERGIES:  CODEINE.   HOME MEDICATIONS:  1. Lanoxin 250 mcg once a day.  2. Propranolol 20 mg twice a day.  3. Zocor 40 mg once a day.  4. Cymbalta 50 mg once a day.  5. Lantus 60 units subcutaneously at bedtime.  6. NovoLog subcutaneous sliding scale.  7. Lasix 40 mg once a day.  8. Aldactone 100 mg daily.   PAST SURGICAL HISTORY:  Positive for cholecystectomy.   SOCIAL HISTORY:  He is divorced.  Lives with his sister and brother.  Denies alcohol, tobacco or elicit drug use.   REVIEW  OF SYSTEMS:  Positive for black stools.  Slight nausea.  No  abdominal pain and no chest pain.  No shortness of breath.  Positive for  some fatigue.  All other systems are unremarkable.   PHYSICAL EXAMINATION:  VITAL SIGNS: Temperature 97.6, respirations 22,  pulse 87, blood pressure 111/49.  GENERAL: The patient is pale, well-  nourished, well-developed and  slightly dehydrated.  In no acute  distress.  HEENT: Head is normocephalic, atraumatic.  PERRL.  EOMI.  No scleral  icterus.  NECK: Soft, supple, nontender, nondistended.  No thyromegaly.  No JVD.  CARDIOVASCULAR:  He has irregular rate and rhythm.  No rubs or  gallops.  LUNGS:  Decreased, clear.  No rales, rhonchi or wheezing.  ABDOMEN:  Soft, nontender, nondistended.  Positive bowel sounds.  No  rigidity or guarding.  EXTREMITIES: Trace edema of bilateral lower extremities.  SKIN:  Pale.  No bruises  or ecchymosis is noted.  NEUROLOGIC:  Cranial nerves II-XII grossly intact.  Patient alert and  oriented x3.   LABS:  CK-MB 3.0, troponin less than 0.05, myoglobin is 129, lipase 35.  Sodium 131, potassium 3.9, chloride 98, CO2 26, glucose 365, BUN 24,  creatinine 1.34.  PT 16.1, INR is 1.3.  White count 6.3, hemoglobin 9.0,  hematocrit 28.7, platelet count 109,000.  Acute abdominal series shows  cardiomegaly and a persistent large left pleural effusion and left  basilar atelectasis; benign bowel gas pattern.   ASSESSMENT/PLAN:  1. Gastrointestinal bleed:  No upper or lower a this time.      Gastroenterology has been consulted.  The ER physician spoke with      gastroenterology.  The patient is on Octreotide as well as a      Protonix drip at this time.  Will continue to check the patient's      hemoglobin q. 8 hours.  At this time the patient will be kept      n.p.o..  2. Chronic atrial fibrillation:  The patient will continue his home      oxygen at this time.  The patient will be sent to the Critical Care      Unit.  We will  continue cardiac monitoring.  3. History of cirrhosis and Crohn's disease:  The patient will      continue on his current home medications.  4. Hypertension and hyperlipidemia:  The patient will also be      continued on his home medications.  5. Lastly, the patient will be on deep vein thrombosis prophylaxis      with SCDs to his bilateral lower extremities.  Will continue to      follow the patient closely and await recommendations from      gastroenterology in the a.m.      Willie Latch, DO  Electronically Signed     SM/MEDQ  D:  01/21/2008  T:  01/21/2008  Job:  [id number]   cc:   [name], M.D.  Fax: [phone]

## 2010-05-19 NOTE — Assessment & Plan Note (Signed)
[facility]                         ELECTROPHYSIOLOGY OFFICE NOTE   [name], [name]                       MRN:          [id number]  DATE:08/11/2006                            DOB:          [birth year]    [name] returns today for followup.  He is a very pleasant 65-year-  old man with a history of cryptogenic cirrhosis who in the past has been  listed for liver transplant, though he has apparently been delisted.  He  has a history of chronic atrial fibrillation as well as insulin-  dependent diabetes, hypertension and dyslipidemia.  He has diabetic  nephropathy and neuropathy and retinopathy.  The patient states that he  has had 3 or 4 episodes of syncope in the last several weeks.  He was  actually hospitalized by Dr. Dietrich Pates, but there was no obvious etiology  for his spells.  He underwent 2D echo which demonstrated preserved LV  systolic function.  He has very mild aortic valve sclerosis and  stenosis.  The patient states that with his syncopal episodes they occur  suddenly with very little, if any, warning and they last just seconds.  He states that often times by the time he gets to the ground he is  already awake.  They have never lasted more than a few seconds.  There  is no severe diaphoresis or nausea before or after the episodes occur.  He does have very mild dyspnea with exertion.   PAST MEDICAL HISTORY:  As noted above for:  1. Diabetes.  2. Dyslipidemia.  3. Hypertension.  4. Cryptogenic cirrhosis.   FAMILY HISTORY:  Noncontributory.   SOCIAL HISTORY:  He denies tobacco or ethanol abuse.   REVIEW OF SYSTEMS:  As noted in the HPI, also has occasional problems  with diarrhea and the patient does, by the way, have a history of  Crohn's disease.  The rest of review of systems was negative except for  some fatigue and weakness.   PHYSICAL EXAM:  He is a pleasant, chronically ill-appearing middle-aged  man who is in no acute  distress.  The blood pressure was 90/50, the  pulse was 73 and regular, the respirations were 18, the weight was 258  pounds.  HEENT EXAM:  Normocephalic and atraumatic.  Pupils equal and round.  The  oropharynx is moist, the sclera were anicteric.  NECK:  No jugular venous distention, there was no thyromegaly, the  trachea was midline, the carotids were 2+ and symmetric.  LUNGS:  Clear bilaterally to auscultation.  No wheezes, rales or rhonchi  were present.  There is no increased worker breathing.  CARDIOVASCULAR EXAM:  An irregularly irregular rhythm with normal S1 and  S2.  EXTREMITIES:  Trace peripheral edema bilaterally.  There was no cyanosis  or clubbing noted.   His EKG demonstrates atrial fibrillation with a controlled ventricular  response.   IMPRESSION:  1. Unexplained syncope.  2. Atrial fibrillation.  3. Diabetes.  4. Cryptogenic cirrhosis.   DISCUSSION:  [name]' etiology of syncope is unclear.  I think the  most likely explanation, however, is that  of symptomatic bradycardia  secondary to pauses.  The patient has worn a 48-hour Holter with no  obvious cause for syncope and I have recommended that he undergo the  wearing of a 30-day monitor or at least a 3-week monitor.  I will see  him back in the office in approximately 6 weeks.  If his monitor is  unremarkable then we would consider implantable loop recorder, though I  suspect that bradycardia will in fact be demonstrated as the underlying  cause of his syncope.     Doylene Canning. Ladona Ridgel, MD  Electronically Signed    GWT/MedQ  DD: 08/11/2006  DT: 08/11/2006  Job #: [id number]   cc:   Gerrit Friends. Dietrich Pates, MD, [facility]  Kirk Ruths, M.D.

## 2010-05-19 NOTE — Procedures (Signed)
NAMENICO, SYME                ACCOUNT NO.:  [id number]   MEDICAL RECORD NO.:  [id number]          PATIENT TYPE:  INP   LOCATION:  A203                          FACILITY:  [facility]   PHYSICIAN:  Noralyn Pick. Eden Emms, MD, FACCDATE OF BIRTH:  11/20/1945   DATE OF PROCEDURE:  DATE OF DISCHARGE:                                ECHOCARDIOGRAM   INDICATIONS:  Syncope, atrial fibrillation.   Left ventricular cavity size was normal.  There was mild to moderate LVH  with basal septal prominence.  There was no outflow tract gradient.  EF  was 60% with no regional wall motion abnormalities.  There was mild  annular calcification of the mitral valve with no significant MR.  There  was mild to moderate biatrial enlargement.  Right ventricle was normal.  There was no evidence of pulmonary retention.  There was mild TR.  The  aortic valve was trileaflet and sclerotic.  There was no significant  stenosis or regurgitation.  Aortic root was normal.  Subcostal imaging  revealed no atrial septal defect, no source of embolus, no effusion.   FINAL IMPRESSION:  1. Mild to moderate left ventricular hypertrophy with basal septal      prominence, ejection fraction 60%.  2. Aortic valve sclerosis to very minimal aortic stenosis.  No      significant aortic insufficiency or aortic root enlargement.  Peak      aortic velocity only 1.6 meters per second with a mean gradient of      6 mmHg.  3. Mild to moderate biatrial enlargement.  4. Mild mitral annular calcification with no significant      regurgitation.  5. No pericardial effusion.  6. Normal right ventricle.   Overall the study is unchanged from previous echocardiogram done August 14, 2005.      Noralyn Pick. Eden Emms, MD, [facility]  Electronically Signed     PCN/MEDQ  D:  07/27/2006  T:  07/27/2006  Job:  [id number]

## 2010-05-19 NOTE — Consult Note (Signed)
NAMEJATAVION, [name]                ACCOUNT NO.:  [id number]   MEDICAL RECORD NO.:  [id number]          PATIENT TYPE:  INP   LOCATION:  A301                          FACILITY:  [facility]   PHYSICIAN:  Kassie Mends, M.D.      DATE OF BIRTH:  February 04, 1945   DATE OF CONSULTATION:  DATE OF DISCHARGE:                                 CONSULTATION   REASON FOR CONSULTATION:  NASH cirrhosis.   HISTORY OF PRESENT ILLNESS:  [name] is a 65 year old Caucasian male  who is well known to our practice.  He has a history of NASH cirrhosis  and Crohn's disease.  He has had a large left pleural effusion which has  been followed at both [facility] and also a pulmonologist in  [place].  He awakened two mornings ago with severe dyspnea and  shortness of breath.  He also had some coughing and chest tightness.  He  denies any recent significant abdominal distention.  He has had some  mild lower extremity edema but not as significant as previously.  He has  had a chest CT angiogram which showed a stable large left pleural  effusion with atelectasis of the left lower lobe.  Lasix 20 mg daily had  recently been added to his diuretic regimen.  He is on Spironolactone  100 mg daily.  He is having anywhere from 3-4 soft bowel movements  daily.  Denies any rectal bleeding or melena.  Denies any heartburn,  indigestion, dysphagia, odynophagia or abdominal pain.  He did have some  nausea, but denies any vomiting.  Has had some abdominal bloating,  hemoglobin was +10.2 upon admission, down to 8.7 today, but he denies  any overt GI bleeding.  He does receive Aranesp q3 weeks through Dr.  Thornton Papas office.  He is followed by the hepatology clinic at [facility] but  is currently not listed on a transplant list.   PAST MEDICAL AND SURGICAL HISTORY:  1. History of large left pleural effusion with atelectasis.  2. NASH cirrhosis followed by [facility] with history of      portal hypertension.  3. He has  history of variceal bleeding.  In February of 2007, Dr.      Arlyce Dice placed three bands due to gastric variceal bleed.  He had      grade 2 varices.  In February of 2008, he had three bands which      were in place and four bands placed for grade 4 esophageal varices.      On February 15, 2007, he had grade 2 varices found in the lower      third of the esophagus.  Four ligatures were successfully placed.      A small tubular foreign body is felt to be the previous gastric      clip.  4. History of anasarca.  5. History of atrial fibrillation.  His cardiologist is Dr. Dietrich Pates.  6. Diabetes mellitus.  7. Hypertension.  8. History of Crohn's disease, in remission.  9. History of hyperlipidemia.  10.Anemia of chronic disease.  11.He is  status post cholecystectomy.  12.He had a T10 fracture subluxation with adjacent DISH, spinal fusion      with T7 to L1 on March 01, 2006.  He had postoperative      Clostridium difficile.  13.He has history of B-12 deficiency/pernicious anemia.  14.His last colonoscopy was February 10, 2006 where he was noted to      have a large amount of black liquid stool and otherwise normal      exam.   MEDICATIONS PRIOR TO ADMISSION:  1. Lanoxin 250 mcg daily.  2. Propranolol 200 mg b.i.d.  3. Zocor 40 mg daily.  4. Cymbalta 60 mg daily.  5. Lantus 60 units subcutaneous q.h.s.  6. Oxycodone/acetaminophen 5 mg q.4h. p.r.n.  7. Aldactone 100 mg daily.  8. Flomax 0.4 mg daily.  9. Amaryl 4 mg daily.  10.Nexium 40 mg b.i.d.  11.NovoLog sliding scale insulin p.r.n.  12.Questran 4 grams daily p.r.n.  13.Aranesp q3 weeks per Dr. Thornton Papas office.   ALLERGIES:  1. CODEINE.  2. LATEX.   FAMILY HISTORY:  There is no known family history of liver disease or  chronic GI problems including inflammatory bowel disease.   SOCIAL HISTORY:  [name] denies any tobacco, alcohol or drug use.  He  is divorced, he is stable.   REVIEW OF SYSTEMS:  See HPI.   Otherwise negative.   PHYSICAL EXAMINATION:  VITAL SIGNS:  Temperature 97.6, pulse 81,  respirations 20, blood pressure 124/60 and O2 saturation 98% on room  air.  GENERAL:  [name] is an obese, ill-appearing Caucasian male who is  alert, oriented, pleasant, cooperative, in no acute distress.  HEENT:  Sclerae are clear, nonicteric.  Conjunctivae pink.  Moist mucous  membranes without any lesions.  NECK:  Supple without any mass or thyromegaly.  CHEST:  Heart irregular regular without any murmurs, clicks, rubs or  gallops.  LUNGS:  He has decreased breath sounds on the left.  No adventitious  sounds.  No acute distress.  ABDOMEN:  Protuberant with positive bowel sounds x4.  He does have a  positive fluid wave with scant ascites.  He has an easily reducible  umbilical hernia that is nontender.  He does have splenomegaly, and  liver is palpable 4 fingerbreadths below the right costal margin and  nontender.  No rebound tenderness or guarding.  EXTREMITIES:  He has trace pretibial and ankle edema bilaterally.   LABORATORY STUDIES:  Hemoglobin 8.7, hematocrit 26.3, platelet 107, WBCs  3.7.  PT 15.9, INR 1.2, PTT 32.  D-dimer 2.81.  Calcium 8.1, sodium 136,  potassium 4.5, chloride 104, CO2 of 26, BUN 23, creatinine 1.16 and  glucose 188.  Phosphorus 3.5, magnesium 1.6, total bilirubin 0.9,  alkaline phosphatase 113, AST 29, ALT 23, total protein 7.8, albumin 3  and BNP 60.6.   IMPRESSION:  [name] is a 65 year old Caucasian male with NASH  cirrhosis (with a large left pleural effusion) and atelectasis which has  been persistent over the past several months.  A thoracentesis to my  knowledge was in February of 2009.  I suspect his shortness of breath  is secondary to this large pleural effusion versus anemia, given the  drop in hemoglobin from 10.2 to 8.7, although he does not display any  gross GI bleeding, but he does have a history of significant esophageal  varices, grade 2 to  4, very little abdominal ascites at this time and  lower extremity edema.  His Crohn's disease is stable.  PLAN:  1. Stat hemoglobin and hematocrit at noon.  2. If his hemoglobin drops, would consider urgent EGD by Dr. Cira Servant.  3. Would treat his pleural effusion with diuretic therapy as tolerated      and limit thoracentesis if able.  4. He should follow up with the [facility] hepatology clinic.  5. There is no need for abdominal paracentesis at this point.   I would like to thank you for allowing [place] to participate in the care of  [name].      Lorenza Burton, N.P.      Kassie Mends, M.D.  Electronically Signed    KJ/MEDQ  D:  07/04/2007  T:  07/04/2007  Job:  [id number]   cc:   Gerrit Friends. Dietrich Pates, MD, [facility]  [address]  [place], [place] [address]   [facility]   Patrica Duel, M.D.  Fax: [phone]

## 2010-05-19 NOTE — Procedures (Signed)
[name], [name]                ACCOUNT NO.:  [id number]   MEDICAL RECORD NO.:  [id number]          PATIENT TYPE:  OUT   LOCATION:  RESP                          FACILITY:  [facility]   PHYSICIAN:  Kofi A. Gerilyn Pilgrim, M.D. DATE OF BIRTH:  Aug 20, 1945   DATE OF PROCEDURE:  DATE OF DISCHARGE:                              EEG INTERPRETATION   HISTORY:  This is a 65 year old man, who presents with syncopal  episodes, suspicious for seizures.   MEDICATIONS:  Cymbalta, oxycodone, aldactone, Flomax, Zocor, Amaryl,  propranolol, Nexium, insulin, Lanoxin.   ANALYSIS:  A 16-channel recording is conducted for 30 minutes.  There is  a well-formed posterior background rhythm of 7 to 7.5 hertz, which  attenuates by opening.  There is beta activity noted in the frontal  areas.  Only awake activity is observed.  Photic stimulation is  conducted without any changes in the background activity.  There is no  focal slowing, lateralized slowing or epileptiform activity observed.   IMPRESSION:  This recording shows mild generalized slowing, indicating a  mild generalized encephalopathy and/or dementia.  However, there are no  episodes of epileptiform activity observed.  A single recording does not  rule out epileptic seizures.  If clinically indicated, a sleep-deprived  recording could be useful.      Kofi A. Gerilyn Pilgrim, M.D.  Electronically Signed     KAD/MEDQ  D:  08/10/2006  T:  08/10/2006  Job:  [id number]

## 2010-05-19 NOTE — Op Note (Signed)
NAMELUPE, BONNER                ACCOUNT NO.:  [id number]   MEDICAL RECORD NO.:  [id number]          PATIENT TYPE:  INP   LOCATION:  A313                          FACILITY:  [facility]   PHYSICIAN:  R. Roetta Sessions, M.D. DATE OF BIRTH:  1945/12/11   DATE OF PROCEDURE:  01/15/2008  DATE OF DISCHARGE:                               OPERATIVE REPORT   PROCEDURE:  EGD with esophageal band ligation esophageal varices.   INDICATIONS FOR PROCEDURE:  A 65 year old gentleman with well-  established NASH cirrhosis.  He has known esophageal varices.  Presented  with melena and a significant drop in his hemoglobin requiring 2 units  transfusion.  He remains hemodynamically stable.  EGD is now being done  in the OR under propofol.  Mr. Conaway has been counseled on this  approach.  We talked about the risks, benefits and alternatives and  limitations, potential for general anesthesia and endotracheal  intubation through upper __________ airway.  He has been reviewed.  Anesthesia is on standby and will be providing anesthesia in the OR.  Hemoglobin and hematocrit that morning 9.7 and 30.2.  He has not had any  hematemesis this admission and has not had any melenic stools in the  past 24 hours.   PROCEDURE NOTE:  The patient was placed in the left lateral decubitus  position on the OR table.  MAC anesthesia administered.  Cetacaine spray  for topical pharyngeal anesthesia.  Instrumentation:  Pentax video chip  system.   FINDINGS:  Examination of the vertebral esophagus revealed 4 columns of  grade 2 esophageal varices involving the distal esophagus.  Two of these  had a cherry red spot and the other of the 2 had a wale mark.  There was  no active bleeding.  There was no esophagitis or other abnormality.  The  EG junction was easily traversed   Stomach:  Thorough examination of the gastric mucosa including  retroflexion, proximal stomach, esophagus, gastric junction demonstrated  diffuse __________  fish scale appearance of the gastric mucosa  consistent with portal gastropathy.  There was a small hiatal hernia.  There was no ulcer.  Pylorus was patent, easily traversed.  Examination  of the bulb to the second portion revealed no abnormalities.   I pulled the scope back into the stomach and there was an area of mucosa  that was oozing just a bit along the greater curvature/junction of the  antrum.  This was washed and observed.  This appeared to be friable  mucosa susceptible to minor scope trauma.  Bleeding appeared to be very  minor and self-limiting.  The scope was withdrawn.  The Microvasive 7  shot bander was loaded  up.  The scope was reintroduced into the  esophagus and 2 bands were deployed, one band each on the 2 suspect  varices.  This was done, maintaining good hemostasis.  With this  maneuver, the bands in the ligated varices pretty well occluded the  lumen and to get an approach at the remaining 2 columns would have had  to advance the scope across the 2 areas already  banded.  This would have  potentially produced unacceptable shear forces on the 2 varices already  ligated.  I elected not to attempt placement of further bands.  Again,  good hemostasis was maintained.  The patient tolerated the procedure  very well and was taken to the recovery room in stable condition.   IMPRESSION:  1. Four columns of grade 2 esophageal varices.  Two of these columns      had bloody stigmata concerning for recent bleeding status post      application of bands as described above, with good hemostasis being      maintained.  2. The other 2 columns of grade 2 varices not manipulated.  3. Diffuse change in gastric mucosa.  Mucosa is with portal      gastropathy and marked friability  of the gastric mucosa.  Small      hiatal hernia.  Patent pylorus.  Normal D1, D2.   RECOMMENDATIONS:  1. Continue clear liquid diet.  2. Continue IV Sandostatin for the time being and hopefully we can       taper it off in the next 24 hours.  3. Continue nonselective beta blocker in the way of propranolol 20 mg      twice daily.  He may need a slightly increased dose of propranolol      to provide additional beta blockade but we will see how his heart      rate does now that hopefully the bleeding has subsided.  4. We will plan to bring him back in 4-6 weeks for a repeat banding      session.  5. Repeat CBC tomorrow morning.      Jonathon Bellows, M.D.  Electronically Signed     RMR/MEDQ  D:  01/15/2008  T:  01/15/2008  Job:  [id number]   cc:   Kirk Ruths, M.D.  Fax: [phone]

## 2010-05-19 NOTE — Group Therapy Note (Signed)
NAMESHYHIEM, BEENEY                ACCOUNT NO.:  [id number]   MEDICAL RECORD NO.:  [id number]          PATIENT TYPE:  INP   LOCATION:  A301                          FACILITY:  [facility]   PHYSICIAN:  Lucita Ferrara, MD         DATE OF BIRTH:  June 11, 1945   DATE OF PROCEDURE:  DATE OF DISCHARGE:                                 PROGRESS NOTE   The patient was examined by bedside today.  Overall the patient states  he is doing much better.  He has a history of cirrhosis, chronic  ascites, Crohn disease and multiple other medical problems that are  confounding.   PHYSICAL EXAMINATION:  VITAL SIGNS:  Temperature 97.9, pulse 78,  respirations 20, blood pressure 198/48, pulse ox 96% on room air.  HEENT:  Normocephalic, atraumatic.  The sclerae are actually anicteric.  CARDIOVASCULAR:  S1, S2.  ABDOMEN:  Obese, nontender, nondistended.  EXTREMITIES:  No clubbing, cyanosis or edema.  NEURO:  The patient is alert and oriented x3.   LABORATORY DATA:  CBC:  White count 3.4, hemoglobin 8.7, hematocrit  27.1, platelet count 101.  BMET:  BUN 22, creatinine 1.28.  H&H 9 and  28.4 prior to today.  CT angio shows a stable large left pleural  effusion with associated compressive atelectasis.   ASSESSMENT:  1. Multifactorial dyspnea likely secondary to fluid overload, ascites      and a component of congestive heart failure.  2. Type 2 diabetes.  3. Chronic atrial fibrillation.  4. Anemia.  5. History of Crohn disease.  6. Cirrhosis and chronic ascites.   PLAN:  Will go ahead and continue recommendations per gastroenterology.  Will consider thoracentesis if there is compromise in respiratory status  because of this fluid and pleural effusion.  Chronic atrial  fibrillation.  Plan still dependent on progress.  Continue to monitor  labs.      Lucita Ferrara, MD  Electronically Signed     RR/MEDQ  D:  07/05/2007  T:  07/05/2007  Job:  [id number]

## 2010-05-19 NOTE — Assessment & Plan Note (Signed)
NAMEAMARE, BAIL                 CHART#:  [id number]   DATE:  03/03/2007                       DOB:  Aug 04, 1945   CHIEF COMPLAINT:  Shortness of breath/ascites.   HISTORY OF PRESENT ILLNESS:  Mr. Breeding is a 65 year old Caucasian male  with history of known NAFLD cirrhosis.  He has been evaluated both here  by Dr. Jena Gauss as well as at [facility].  He tells  me over the last three weeks he noticed his weight has been increasing  by about 12 pounds.  He has noticed increased abdominal girth and  shortness of breath on exertion.  Last week he underwent thoracentesis  at [facility] under the direction of Dr. Geanie Logan orders for  a large left pleural effusion.  He has been noticing shortness of breath  on exertion.  He has had a dry cough.  He denies any rectal bleeding or  melena.  He denies any palpitations.  Denies any chest pain.  He does  give history of GI bleeding and underwent EGD with esophageal banding of  his varices on November 14, 2006 at [facility].  His last LVOP  was back in November 2007.  He had 2940 mL removed at that time.  He is  on spironolactone 50 mg daily.  He denies any fevers or chills.   PAST MEDICAL/SURGICAL HISTORY:  1. He has NAFLD cirrhosis and is followed at [facility].  2. He has history of portal hypertension.  3. Variceal bleeding.  4. Anasarca.  5. He history of atrial fibrillation.  His cardiologist is Dr.      Dietrich Pates.  6. He has diabetes mellitus.  7. Hypertension.  8. He has history of Crohn's disease, currently well controlled.  9. He has history of hyperlipidemia.  10.Anemia of chronic disease.  11.Status post cholecystectomy.  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  13.He had postoperative Clostridium difficile.  14.February 15, 2007 he had grade 2 varices found in the lower third      of the esophagus.   Four ligatures were successfully placed.  A      small tubular foreign body was found protruding in the gastric wall      lumen.  It is suggested that he have repeat EGD March 07, 2007.  15.History of B12 deficiency/pernicious anemia.   CURRENT MEDICATIONS:  1. Lanoxin 250 mcg daily.  2. Propranolol 200 mg b.i.d.  3. Zocor 40 mg daily.  4. Lantus 60 units at bedtime.  5. Cymbalta 60 mg daily.  6. Spironolactone 50 mg daily.  7. Omeprazole 20 mg daily.  8. ? injection 50 mg q. 3 weeks.  9. NovoLog sliding scale insulin.   ALLERGIES:  CODEINE and BANDAGE TAPE.   FAMILY HISTORY:  Noncontributory   SOCIAL HISTORY:  Mr. Albright denies any tobacco, alcohol or drug use.  He  is divorced.  He is disabled.   REVIEW OF SYSTEMS:  See HPI.  Otherwise negative.   PHYSICAL EXAMINATION:  VITAL SIGNS:  Weight 272 pounds, height 73  inches, temperature 97.3, blood pressure 130/80, pulse 76.  GENERAL:  Mr. Guillotte is a pale-appearing Caucasian male who is alert and  oriented, pleasant, cooperative, in no acute distress.  HEENT:  Sclerae clear.  Conjunctiva pink.  Oropharynx pink and moist  without lesions.  NECK:  Supple without JVD  or thyromegaly.  HEART:  Heart rate irregularly irregular without any murmurs, rubs,  thrills, or gallops.  LUNGS:  Decreased breath sounds bilaterally.  No adventitious sounds.  No acute distress.  ABDOMEN:  Protuberant with positive bowel sounds x4.  He does have a  large, easily reducible umbilical hernia that is nontender.  He has a  positive fluid wave. Unable to palpate hepatosplenomegaly secondary to  fluid.  His abdomen is quite distended, and he had bulging flanks but  not as tense as he has been previously.  EXTREMITIES:  With trace pretibial edema bilaterally.   IMPRESSION:  Mr. Schetter is a 65 year old male with history of NAFLD  cirrhosis with ascites, recent left pleural effusion and increasing  shortness of breath.  Etiology of her shortness of  breath could be  related to either recurrence of pleural effusion and/or ascites.  Other  causes of shortness of breath should be considered as well including  anemia.  He denies any chest pain.  However, he is told if he develops  that he is to go immediately to the emergency room or call 911.  His  case has been discussed with Dr. Jena Gauss and our plan of care is outlined  below.   He has history of Crohn's disease which is well controlled at this time   PLAN:  1. He will get a followup chest x-ray on Monday to compare with      previous, status post thoracentesis.  2. Large volume abdominal paracentesis, ultrasound guided, by      radiology next week for recurrent ascites.  3. Will send fluid for cytology for cell count with differential, Gram      stain, aerobic and anaerobic cultures and blood cultures bottles      and AFB smear and culture.  4. Looks like he is due for a followup on his esophageal varices on      March 07, 2007 per      [facility] note.  5. Labs today to include CBC, INR, MET7, and LFTs.       Lorenza Burton, N.P.  Electronically Signed     R. Roetta Sessions, M.D.  Electronically Signed    KJ/MEDQ  D:  03/03/2007  T:  03/04/2007  Job:  [id number]   cc:   Patrica Duel, M.D.

## 2010-05-19 NOTE — Assessment & Plan Note (Signed)
[name]                       [name]   [name], [name]                       MRN:          [id number]  DATE:10/27/2007                            DOB:          [birth year]    REASON FOR VISIT:  Three-month followup.   HISTORY OF PRESENT ILLNESS:  [name] is a 65 year old male with a  history of permanent atrial fibrillation who also has a history of NASH  cirrhosis.  He is followed chronically by Dr. Jena Gauss.  He is not on  Coumadin anticoagulation secondary to history of esophageal varices.  He  is also not on aspirin therapy secondary to a history of peptic ulcer  disease.  He has a history of large left pleural effusion followed at  [facility].  This has been drained in the past.  He is on chronic  diuretic therapy and this is managed by Dr. Jena Gauss.  He is apparently on  the transplant list at [facility].  The patient tells me he recently had his  Lasix dosage decreased a few days ago secondary to changes in his renal  function.  He returns today for routine followup.   The patient denies any development of chest pain.  His shortness of  breath is much improved since he was in the hospital in July.  He denies  orthopnea, PND.  His pedal edema is improved and stable.  He denies any  syncope.  He has had some episodes of near-syncope or lightheadedness.  This sounds to be secondary to orthostasis.  He has not noted a great  difference in this since he had his Lasix doses decreased.  He does not  [name] symptoms every time he goes from lying to standing.   CURRENT MEDICATIONS:  1. Cymbalta 60 mg daily.  2. Zocor 40 mg nightly.  3. Propranolol 20 mg b.i.d.  4. NovoLog insulin sliding scale.  5. Lantus 60 units nightly.  6. Lanoxin 0.25 mg daily.  7. Lasix 20 mg daily.  8. Oxycodone p.r.n.  9. Furosemide p.r.n.   ALLERGIES:  CODEINE and BAND-AIDS.   PHYSICAL EXAMINATION:  GENERAL:  He is a chronically  ill-appearing male  in no acute stress.  VITAL SIGNS:  Blood pressure is 110/60, pulse 72, weight 273 pounds.  HEENT:  Normal.  NECK:  Without JVD.  CARDIAC:  Normal S1 and S2, irregularly regular rhythm.  LUNGS:  With decreased breath sounds at left base with positive  egophony.  No rales on the right.  No wheezing.  ABDOMEN:  Soft, nontender.  EXTREMITIES:  With trace 1+ edema bilaterally.  NEUROLOGIC:  He is alert and oriented x3.  Cranial II through XII  grossly intact.   Rhythm strip demonstrates atrial fibrillation with a heart rate of 94.   ASSESSMENT AND PLAN:  1. Permanent atrial fibrillation with controlled ventricular response.      The patient is not anticoagulated with Coumadin secondary to      history of nonalcoholic steatohepatitis cirrhosis and esophageal      varices.  He is also not on aspirin secondary to history of  peptic      ulcer disease.  I also [name] in his chart he has evidence of      thrombocytopenia.  This is likely related to his liver disease.      Therefore, he is continued on rate control therapy at this time.      No medication adjustments were made today.  2. 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  His diuretic therapy is managed by Dr. Jena Gauss.  He      recently had some changes made in his dosage secondary to abnormal      renal function.  We will try to obtain BMET results from last week,      as well as upcoming BMET results next week for our records.  Please      see #3.  3. Lightheadedness.  He describes what sounds like orthostatic      intolerance.  I suspect he may have had some dehydration from over-      diuresis given the recent adjustments in his Lasix therapy.  We      will check orthostatic vital signs in the office today.  Hopefully,      his blood pressures will not drop too significantly from lying to      standing.  I would be hesitant to decrease his Lasix any more given      his history  of recurrent pleural effusions and volume overload in      the setting of cirrhosis.  4. Hypertension.  This is well controlled.  As noted above, we will      check his orthostatic vital signs.  5. Diabetes mellitus.  Continue follow up with Dr. Nobie Putnam.   ADDENDUM:  His orthostatic vital signs today revealed a blood pressure  of 115/69 lying with a pulse of 79, sitting 94/62 with a pulse of 73,  standing after 2 minutes 96/60 with a pulse of 75.  His pressure does  drop about 20 points from lying to sitting, but his heart rate remains  stable.  As noted above, I would be hesitant to decrease his Lasix any  more.  We will try to obtain his lab results as outlined.  The patient  has been educated about hygiene from lying to standing.  We could  certainly consider lower extremity compression stockings in the future  if necessary.   DISPOSITION:  The patient will follow up with Dr. Dietrich Pates in the next 3  months or sooner p.r.n.      Willie Newcomer, PA-C  Electronically Signed      Gerrit Friends. Dietrich Pates, MD, [facility]  Electronically Signed   SW/MedQ  DD: 10/27/2007  DT: 10/27/2007  Job #: [id number]

## 2010-05-19 NOTE — Assessment & Plan Note (Signed)
NAMEWINDELL, MUSSON                 CHART#:  [id number]   DATE:  09/05/2007                       DOB:  May 18, 1945   FOLLOWUP:  1. NASH cirrhosis, history of gastroesophageal varices, status post      bleed, and status post multiple banding sessions.  Last EGD in      March 2008 at [facility], no banding performed per the patient report.      History of lingering gastric resolution clipped for bleeding lesion      placed previously, history of anasarca, atrial fibrillation, no      longer on Coumadin, history of poorly controlled diabetes mellitus,      hypertension, history of small bowel Crohn disease, Pentasa      discontinued (per Dr. Fara Chute), hyperlipidemia, anemia of      chronic disease, status post cholecystectomy, status post T10      fracture subluxation with adjacent DISH spinal fusion T7-L1 in      February 2008, postop complication Clostridium difficile infection,      history of B12 deficiency.  2. Colonoscopy in February 2008 because reportedly it was normal.   The patient has just seen down at [facility] by Dr. Raford Pitcher and Dr. Piedad Climes.  They increased his diarrhea regimen to Lasix 40 mg every day and  continued him on spirolactone 100 mg every day.  They were going to plan  to do another MRI of his liver in 3 months.  His MELD score remains low  and he is not on the transplant list at this time.   The only complaint the patient has currently is a bulge in left inguinal  area sometimes when he stands up to urinate and the other times it comes  and goes.  He does have an umbilical hernia.   CURRENT MEDICATIONS:  See updated list.   ALLERGIES:  Codeine and tape.   PHYSICAL EXAMINATION:  GENERAL:  Today, somewhat gaunt-appearing 65-year-  old gentleman.  VITAL SIGNS:  Weight 273 (he was 270 back in March of this year), height  6 feet 1 inch, temperature 97.7, BP 110/70, and pulse 72.  HEENT:  No scleral icterus.  Conjunctivae are somewhat pale.  CHEST:  Lungs are clear to  auscultation.  CARDIAC:  Irregular regular rhythm without murmur, gallop, or rub.  ABDOMEN:  Obese.  He has an easily reducible umbilical hernia.  Positive  bowel sounds, soft.  He does have a ballotable spleen and liver edge  percussed to the right costal margin.  I do not appreciate an obvious  __________defect in the left inguinal area.  EXTREMITIES:  He has trace lower extremity edema.   ASSESSMENT:  All-and-all, the patient is fairly well compensated at this  time.  Clinically, he may have a new left inguinal hernia.  His diuretic  regimen was increased recently with the folks down at [facility].  He seems to  be getting along with that pretty well at this time, although I do not  have any current labs.  He is due for an MRI there in 3 months.  His  last EGD by report is March 2009 done at [facility] until the patient is ought  to have another one done in the fall of 2010 unless something comes up  clinically.  His Crohn disease is in remission.  Pentasa is limited,  efficacy in a setting of small bowel Crohn disease.  At this point in  time, I will go along with Dr. Chelsea Aus prior recommendations of stopping  the Pentasa.  We can see how he does with this approach and certainly  would like to totally avoid immunomodulator therapy/biologic in this  gentleman in the future if at all possible, likewise corticosteroid  therapy including Entocort could be problematic as well.   We will go ahead and get a Chem-20, protime, and CBC today and see where  we stand unless something comes up.  I will plan to see this nice  gentleman back in 3 months.  Also, we will obtain a serum alpha-  fetoprotein.       Jonathon Bellows, M.D.  Electronically Signed     RMR/MEDQ  D:  09/05/2007  T:  09/05/2007  Job:  [id number]

## 2010-05-19 NOTE — Discharge Summary (Signed)
[name], [name]                ACCOUNT NO.:  [id number]   MEDICAL RECORD NO.:  [id number]          PATIENT TYPE:  INP   LOCATION:  A313                          FACILITY:  [facility]   PHYSICIAN:  Margaretmary Dys, M.D.DATE OF BIRTH:  29-Aug-1945   DATE OF ADMISSION:  01/12/2008  DATE OF DISCHARGE:  01/13/2010LH                               DISCHARGE SUMMARY   DISCHARGE DIAGNOSES:  1. Gastrointestinal bleed secondary to esophageal varices bleeding.  2. Chronic atrial fibrillation.  The patient not a candidate for      anticoagulation due to significant risk of bleeding.  3. History of end-stage liver cirrhosis currently being followed by      [facility].  The patient is on transplant list.  4. Hypertension.  5. Diabetes type 2.  6. Crohn disease.  7. Duodenal ulcer.  8. History of dyslipidemia.  9. Prior history of maxillary sinus disease.  10.History of nonalcoholic steatohepatitis, cirrhosis with esophageal      varices.   DISCHARGE MEDICATIONS:  1. Cymbalta 60 mg p.o. once a day  2. Zocor 40 mg p.o. daily.  3. Propranolol 20 mg p.o. t.i.d.  4. Lantus 60 units subcutaneous daily.  5. Niacin.  6. Lasix 40 mg p.o. daily.  7. Aldactone 100 mg p.o. daily.  8. NovoLog sliding scale.  9. Imodium as needed.  10.Protonix 40 mg p.o. once a day.   FOLLOW UP:  1. The patient is to follow-up with primary care physician, Dr.      Regino Schultze in about 2-3 weeks.  2. Follow up with gastroenterology as scheduled by them.   PROCEDURES PERFORMED:  EGD with esophageal band ligation of esophageal  varices performed by R. Roetta Sessions, M.D. on January 15, 2008.  Indication for procedure was for severe bleeding.   CONSULTATIONS OBTAINED:  R. Roetta Sessions, M.D. for GI.   PERTINENT LABORATORY DATA:  On the day of admission his digoxin level  was 0.6.  Cardiac enzymes were negative.  Sodium was 133, potassium 5.9,  chloride 100, CO2 was 27, glucose 434, BUN of 47, creatinine was  1.46.  Bilirubin 0.7, alkaline phosphatase was 90, AST was 21, ALT of 19, total  protein was 6.5.  PTT of 32.  PT was 15.5.  INR was 1.2.  White blood  cell count was 7.9, hemoglobin of 8.9, hematocrit was 27.6, platelet  count was 116,000.  Chest x-ray shows enlarged heart.  The patient had a  left lung base density consistent with an effusion.  He also had some  atelectasis and he also showed obscured hemidiaphragm.  There was no  evidence of pulmonary edema.   DIET:  A 1800 ADA consistent-carbohydrate diet and also heart-healthy  diet.   SPECIAL INSTRUCTIONS:  1. The patient advised to return to emergency room if he is dark      stools does not improve or if his stools become brownish and then      become dark again.  2. The patient advised to return if he has bright red blood per rectum      and is also  advised to return if he has fevers, chills or nausea or      vomiting.   HOSPITAL COURSE:  [name] is a 65 year old male who presented to the  emergency room with dark stools.  The patient had these episodes in the  past and he does know that he has cirrhosis with varices and has had  multiple banding in the past.  The patient has been seen multiple times  here for cirrhosis and anemia and has been treated by the  gastroenterologist.  When the patient arrived he only complained of some  fatigue.  Otherwise, he was stable.  He was hemodynamically stable with  no concerns for hypovolemic or hemorrhagic shock.   His vital signs, blood pressure was 121/56 with a pulse of 93,  respiration was 20, temperature was 97.9 degrees Fahrenheit.  The rest of his exam was really otherwise unremarkable.  His abdomen exam was soft.   The patient was admitted to the hospital and we consulted the  gastroenterologist who then did a banding of his bleeding varices.  The  patient did very well overall during the course of hospitalization with  no significant concerns.  The patient was  subsequently discharged home  in satisfactory condition to follow up as mentioned above with his  physicians.   DISPOSITION:  To home in satisfactory condition with complete resolution  of his bleeding and also stable hemoglobin and hematocrit.   Prior to discharge the patient's hematocrit was 30.8.  His hemoglobin  was 10, platelet count was 101 and his blood glucose had improved to  220.      Margaretmary Dys, M.D.  Electronically Signed     AM/MEDQ  D:  03/21/2008  T:  03/21/2008  Job:  [id number]

## 2010-05-22 NOTE — Op Note (Signed)
NAME:  [name], [name]                          ACCOUNT NO.:  [id number]   MEDICAL RECORD NO.:  [id number]                   PATIENT TYPE:  INP   LOCATION:  A220                                 FACILITY:  [facility]   PHYSICIAN:  Lionel December, M.D.                 DATE OF BIRTH:  June 30, 1945   DATE OF PROCEDURE:  08/08/2002  DATE OF DISCHARGE:                                  PROCEDURE NOTE   PROCEDURE:  Total colonoscopy.   ENDOSCOPIST:  Lionel December, M.D.   INDICATIONS:  This patient is a 65 year old Caucasian male who presented  with melena and anemia requiring transfusion.  He has been chronically  anticoagulated in atrial fibrillation.  His INR was around 4.  On EGD by Dr.  Jena Gauss yesterday no lesion was found at EGD; and he is, therefore, undergoing  colonoscopy.  His GI is significant for Crohn's disease involving his TI  colon, right colon, and his last colonoscopy was in 1997.  He was noted to  have ulcers in his ascending colon.  He has been on Pentasa and not  clinically resolved.  He is here for intervention.  The procedures were  reviewed with the patient and informed consent was obtained.   PREOPERATIVE MEDICATIONS:  Demerol 50 mg IV and Versed 5 mg IV.   FINDINGS:  Procedure performed in endoscopy suite.  The patient's vital  signs and O2 saturation were monitored during the procedure and remained  stable.  The patient was placed in the left lateral recumbent position and  rectal examination was performed.  No abnormality noted on external or  digital exam.   The Olympus videoscope was placed in the rectum and advanced under vision  into the sigmoid colon and beyond satisfactory prep.  He has some granules  scattered throughout his colon felt to be one of his medications.  There was  no fresh or old blood in the colon.  Scope was passed to the cecum which was  identified by ileocecal valve.  The blunt end of the cecum was now seen. He  has three small scars surrounded  by erythematous mucosa at the cecum.  There  were a few small erosions, but there was no active bleeding.  I was not able  to advance the scope into the TI despite turning him on his back and on his  right side.   As the scope was withdrawn the colonic mucosa was __________.  There was  another erosion of the transverse colon.  The mucosa of the rest of the  colon was normal.  The rectal mucosa was similarly normal.   The scope was retroflexed to examine the anorectal junction which was  unremarkable.  The endoscope was straightened and withdrawn.  The patient  tolerated the procedure well.   FINAL DIAGNOSES:  1. Few cecal erosions along with an erosion at the transverse colon.  No  evidence of active bleeding or stigmata of bleeding.  Endoscopically this     colonic Crohn's disease has improved a great deal.  He certainly could     have bleed from these erosions in the setting of prolonged INR.  2. Since he has had significant gastrointestinal bleed we need to look at     the small bowel as well.   RECOMMENDATIONS:  1. He will have a small-bowel study.  2. He will have ultrasound to review his liver, spleen, etc. in the a.m.     since he was found to have small esophageal varices by Dr. __________.  3. Will advance his diet and __________ in the a.m.                                               Lionel December, M.D.    NR/MEDQ  D:  08/08/2002  T:  08/09/2002  Job:  [id number]   cc:   Madelin Rear. Sherwood Gambler, M.D.  [address]  [place]  [place] [address]  Fax: [phone]

## 2010-05-22 NOTE — Discharge Summary (Signed)
[name], Willie                ACCOUNT NO.:  [id number]   MEDICAL RECORD NO.:  [id number]          PATIENT TYPE:  INP   LOCATION:  6740                         FACILITY:  [facility]   PHYSICIAN:  [name], D.O.    DATE OF BIRTH:  1945-03-16   DATE OF ADMISSION:  02/03/2006  DATE OF DISCHARGE:  02/03/2006                               DISCHARGE SUMMARY   PRIMARY CARE PHYSICIAN:  [name], M.D. in [place].   PRIMARY GASTROENTEROLOGIST:  [name], M.D.   He does have a physician in [facility], Dr. Scot [name], for which he follows quite  closely, as he states that he is on a liver transplant list cirrhosis  and NASH.   PRINCIPAL DIAGNOSIS:  Chance fracture of T10 vertebrae evaluated on  February 05, 2006 by MRI revealing a 6 mm of anterior slip of T9 relative  to T10 and some mild spinal stenosis without cord pressure or cord  edema.  There is a moderately large central osteophyte at T7 and 8 with  flattening of the cord, and there is a large right-sided osteophyte at  T11 and 12 with flattening of the cord and right foraminal encroachment.  Dr. Barnett [name] of neurosurgery had anticipated performing surgical  intervention of this fracture; however, [name] developed further  complications secondary to his portal hypertension and subsequently  developed an acute GI bleed, and this has been postponed with GI  recommendations to refrain from performing surgery for at least 3 more  weeks from today's date, February 15, 2006.  Dr. Danielle [name] has recommended  that he remain on bed rest with log rolls only and a re-attempt at  ambulation perhaps after a 3-week period.   SECONDARY DIAGNOSES:  1. Acute gastrointestinal bleed status post endoscopic evaluation by      gastroenterology, Dr. Arlyce [name], for which this was initially      performed on February 09, 2006 that revealed some minimal edema of      the folds of the fundus of his stomach.  There were grade 2      esophageal  varies, though no stigmata of bleed on the initial EGD.      There was gastritis.  He further underwent colonoscopy on February 10, 2006, as well, that revealed a large amount of black liquid      throughout the entire colon, though no evidence of acute bleeding.      He was set up for a repeat upper endoscopy on February 10, 2006      which revealed esophageal varices without bleed.  He underwent EGD      on February 11, 2006 that revealed grade 4 varices, as well as      gastric varices.  At conclusion, Dr. Arlyce [name] informed me of these      findings and felt as though [name] had suffered from a gastric      variceal bleed and interventions were limited in this regard.  It      was felt that he should not undergo surgery for at  least 4 weeks      from this time, and it was also suggested that he undergo a TIPS      procedure if he does experience a recurrent gastric variceal bleed.      [name] and his family are quite confident in their own      gastroenterologist at [facility] and are hesitant to receive      advice concerning any further management in regards to further      intervention, even including neurosurgical intervention.  They      would all like this to take place at [facility].  We are in      the process of attempting to arrange transfer to [facility] if      there is an accepting physician there.  It is of my opinion that      [name] can undergo skilled nursing home facility placement      prior to any further invasive or reparative procedures at this      time, and if he does develop any recurrent bleeding at any      junction, it would be most appropriate that he be transferred to      [facility], as he has expressed his desire and feelings to      have all of his medical management taking place at [facility].  2. Acute blood loss anemia, status post transfusion of a total of 6      units of packed red blood cells with stabilization  of hemoglobin.      At the time of this dictation, his hemoglobin has risen or remained      stable at 10.0.  3. History of chronic atrial fibrillation, rate controlled.      Anticoagulation is not initiated due to his high risk of bleeding      at this time.  4. Diabetes mellitus, poor control, as evidenced by hemoglobin A1C of      7.9.  He will need further up titration of his diabetic regimen in      the outpatient setting.  Lantus is at 50 units subcutaneous q.h.s.,      and NovoLog sliding scale with meal coverage for q.a.c. and h.s.      coverage.  5. History of Crohn's disease, stable at this time.  6. History of hypertension.  7. Hyperlipidemia.  8. History of anemia of chronic disease.  9. Nephrolithiasis status post cholecystectomy.   MEDICATIONS ON TRANSFER:  1. The patient will continue his Lantus 50 units subcutaneous q.h.s.      and up titrate as necessary.  2. Amaryl 4 mg daily as before.  3. He can continue his Januvia at 100 mg daily.  4. He can continue Lanoxin at 250 mcg daily.  5. Propranolol 20 mg b.i.d.  6. He can resume his Zocor at 80 mg nightly.  7. Spironolactone at 50 mg b.i.d. with recommendations for his primary      physician to follow closely his electrolytes and renal function.  8. Lasix 40 mg b.i.d. with instructions for his primary managing      physician to follow his electrolytes and renal function very      closely.  9. Cymbalta at 60 mg daily.  10.Tricor 145 mg daily.  11.Pentasa 500 mg two tablets q.i.d.  12.Aranesp one injection every 3 weeks.  13.Vitamin B-12 injection every month 1000 mcg.  14.Flexeril 10 mg  daily.  15.Protonix 40 mg p.o. b.i.d.  16.His NovoLog sliding scale should be based on the       resistant sliding scale, and he should receive q.a.c. NovoLog meal      coverage with 4 units, as well as q.h.s. coverage.   DISPOSITION AT THIS TIME:  [name] is felt to be stable for discharge, and instructions to  follow up with his own physicians in  reference to next steps.  It was suggested that if he does develop a  recurrent gastric variceal bleed that he undergo a TIPS procedure.  I  have discuss him as a potential candidate with Dr. Fredia Sorrow.  These  discussions can continue ongoing in the outpatient setting.  Mr. Thoman  is being arranged for a skilled nursing facility for 3 weeks duration  prior to any surgical attempt at repair of his Chance fracture.  Until  then, he should remain at bed rest and log rolls for transfers only.   HISTORY OF PRESENT ILLNESS:  For full details, please refer to the H&P  as dictated by Dr. 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  He stated that he hit the cement pretty hard.  He  presented to the emergency department at [facility].  X-ray of his  lumbar spine was ordered, and it revealed multilevel degenerative disk  and facet disease and old-appearing anterior compression deformity at L2  vertebral body.  There are no acute abnormalities seen on x-ray.  The  patient continued to have intermittent low back pain.  There were no  complaints of numbness or tingling of his extremities, and no complaints  of weakness.  Over the past 2-3 days prior the presentation, he noted  some more difficulty ambulating, primarily because of weakness in his  legs.  He had more numbness than tingling in his feet, which now  radiated to his buttocks.  He denied any focal unilateral weakness of  either leg.  He denied incontinence of his bladder.  In any event, in  the emergency department he was found  to be hemodynamically stable.  There was a stable L2 compression fracture with spondyloarthropathy of  the S1 joint.  In any event, he was admitted for further management.   HOSPITAL COURSE:  Mr. Chavarria was admitted.  He underwent neurosurgical  consultation, at which time it was discerned that he had a T10 fracture  by CT scan, and it was anticipated that he would undergo surgical  repair.  In the interim, he was determined to have anemia and the source  was pursued.  He was ultimately transfused.  Gastroenterology evaluated  Mr. Terhune, and, as described above, it was ultimately determined that  he had a gastric variceal bleed that had stopped at present.  He  underwent a short drip with octreotide and tapered off, as well as  proton pump inhibitors.  Discussions were undertaken in reference to  proceeding to the OR.  I spoke with Dr. Arlyce [name], who did not feel that it  would be wise to undergo surgical intervention until at least 3-4 weeks. At this juncture, we are  currently awaiting either skilled nursing  facility bed availability or transfer to [facility] if there is an  accepting physician.      [name], D.O.  Electronically Signed     ESS/MEDQ  D:  02/15/2006  T:  02/15/2006  Job:  [id number]   cc:   R. Roetta Sessions, M.D.  [name], M.D.  Dr. Scot [name]

## 2010-05-22 NOTE — H&P (Signed)
[name], PURK NO.:  [id number]   MEDICAL RECORD NO.:  [id number]          PATIENT TYPE:  EMS   LOCATION:  ED                           FACILITY:  [facility]   PHYSICIAN:  Elliot Cousin, M.D.    DATE OF BIRTH:  [birth year]   DATE OF ADMISSION:  02/03/2006  DATE OF DISCHARGE:                              HISTORY & PHYSICAL   PRIMARY CARE PHYSICIAN:  Dr. Karleen Hampshire, [name], [place].   PRIMARY GASTROENTEROLOGIST:  Dr. Roetta Sessions, [place], [place].   CHIEF COMPLAINT:  Low back pain, bilateral leg weakness, inability to  walk.   HISTORY OF PRESENT ILLNESS:  The patient is a 65 year old man with a  past medical history significant for NASH cirrhosis, type 2 diabetes  mellitus, and chronic atrial fibrillation, who presents to the emergency  department with a chief complaint of low back pain, worsening bilateral  lower extremity weakness, and the inability to ambulate.  His symptoms  started approximately 4 weeks ago after he fell on ice outside.  He  states that he hit the cement pretty hard.  Following the fall, he  presented to the emergency department at [facility].  At that  time, an x-ray of his lumbar spine was ordered and it revealed  multilevel degenerative disk and facet disease changes, and an old-  appearing anterior compression deformity at L2 vertebral body.  There  were no acute abnormalities seen on the x-ray.  The patient continued to  have intermittent low back pain.  Initially, there were no complaints of  numbness or tingling of his extremities and no complaints of weakness.  Over the past 2-3 days, he has noticed more difficulty ambulating,  primarily because of weakness in his legs.  He has had more numbness and  tingling in his feet which now radiates to his buttocks.  He denies  focal unilateral weakness of either leg.  He denies incontinence of his  bladder or bowels.  His back pain is now rated as a  7 or 8 over 10 in  intensity.  It starts in the lower back and radiates to below his knees.  He denies any weakness of his upper extremities.  Per his son, he has  had no witnessed cranial nerve deficits such as slurred speech,  difficulty speaking, or difficulty swallowing.  The patient denies any  recent low blood sugars at home.   During the evaluation in the emergency department, the patient is noted  to be hemodynamically stable.  A CT scan of his lumbar spine was ordered  by the emergency department physician and reveals a stable L2  compression fracture, degenerative joint changes including mild lateral  recess narrowing at L3-4, left greater than right.  No significant large  HNP.  S1 joint fusion suggesting spondyloarthropathy.  The patient is  unable to stand without assistance and will therefore be admitted for  further evaluation and management.   PAST MEDICAL HISTORY:  1. NASH cirrhosis.  The patient is followed by [facility]      for a potential  transplant.  2. Ascites status post paracentesis, November 2007, at [facility], yielding 2940 mL of fluid.  3. Type 2 diabetes mellitus.  4. Hypertension.  5. Crohns disease, diagnosed in 1997.  6. History of variceal bleed in 2006.  7. Remote duodenal ulcer.  8. Atrial fibrillation (not on Coumadin secondary to GI bleed).  9. Hyperlipidemia.  10.Anemia of chronic disease (the patient receives Aranesp and vitamin      B12 injections under the guidance of hematologist, Dr. Laurie Panda).  11.Nephrolithiasis status post stone extraction in the past.  12.Status post cholecystectomy.   MEDICATIONS:  1. Apridra 4 units sliding scale.  2. Lantus insulin 50 units subcu nightly.  3. Amaryl 4 mg daily.  4. Januvia 100 mg daily (started last week).  5. Actos and metformin were discontinued several weeks ago secondary      to renal insufficiency.  6. Lanoxin 250 mcg daily.  7. Propranolol 20 mg b.i.d.  8.  Diovan 80 mg daily.  9. Simvastatin 80 mg nightly.  10.Spironolactone 50 mg b.i.d.  11.Lasix 40 mg b.i.d.  12.Cymbalta 60 mg daily.  13.TriCor 145 mg daily.  14.Pentasa 500 mg 2 tablets q.i.d.  15.Aranesp 1 injection every 3 weeks.  16.Vitamin B12 injection every month.   ALLERGIES:  The patient has an allergy to CODEINE.   SOCIAL HISTORY:  The patient is divorced.  [place]  He has 1 son, [name].  He is disabled.  He denies alcohol,  tobacco, and illicit drug use.   FAMILY HISTORY:  His mother died of heart failure at 51 years of age.  His father died of a heart attack at 48 years of age.   REVIEW OF SYSTEMS:  Review of systems is positive for low back pain,  worsening bilateral lower extremity weakness.  History of swelling of  the legs which has now significantly resolved over the past few weeks.  Occasional numbness in the feet from diabetes.  He has also had some  burning with urination.  Otherwise, review of systems is negative.   PHYSICAL EXAMINATION:  VITAL SIGNS:  Temperature 99.4, blood pressure  153/62, pulse 88, respiratory rate 20, oxygen saturation 97% on room  air.  GENERAL:  The patient is a pleasant 65 year old Caucasian man who is  currently lying in bed in no acute distress.  HEENT:  Head is normocephalic, nontraumatic.  Pupils are equal, round,  and reactive to light.  Extraocular movements are intact.  Conjunctivae  are clear.  Sclerae are white.  Nasal mucosa is mildly dry.  Oropharynx  reveals fair dentition.  Mucous membranes are moist.  No posterior  exudates or erythema.  NECK:  Supple.  No adenopathy.  No thyromegaly.  No bruits.  No JVD.  LUNGS:  Clear to auscultation bilaterally.  HEART:  Irregularly irregular, with a soft systolic murmur.  ABDOMEN:  Obese.  Positive bowel sounds.  Soft, nontender, nondistended.  No hepatosplenomegaly.  No masses palpated.  Well-healed epigastric scar  is noted. EXTREMITIES:  Pedal  pulses are 2+ bilaterally.  No pretibial edema and  no pedal edema.  BACK:  Mild to moderate tenderness over the lumbosacral spine and  surrounding muscles without appreciable edema, erythema, or spasm.  NEUROLOGIC:  The patient is alert, oriented x3.  Cranial nerves, II  through XII, are intact.  Strength of the upper extremities is 5/5  symmetrically.  In the supine position, bilateral lower extremity  strength  is approximately 4+ to 5- over 5.  Sensation is grossly intact.  Plantar reflexes are downgoing.  Gait was not assessed; however, per the  emergency department physician, the patient was unable to stand without  significant assistance.  No nystagmus seen on exam.  Cerebellar with  finger-to-nose is intact bilaterally.   ADMISSION LABORATORIES:  EKG pending.  CT scan of the lumbar spine,  results as above.  WBC 6.2, hemoglobin 12.1, hematocrit 36.7, MCV 83.2,  platelets 129.  Sodium 133, potassium 4.8, chloride 102, CO2 of 24,  glucose 331, BUN 31, creatinine 1.45, calcium 9.3, total protein 8.1,  albumin 3.2, AST 30.  Urinalysis with 30 protein, greater than 1000  glucose, urine WBCs 0-2, urine RBCs 0-2.   ASSESSMENT:  1. Low back pain with associated bilateral lower extremity weakness.      The computed tomography scan of the lumbar spine did not reveal any      evidence of obvious cord compression.  An magnetic resonance      imaging of the spine is the diagnostic test of choice; however, it      could not be performed early this morning.  The patient does have      some weakness on exam; however, it did not appear significant when      he was tested in the supine position.  However, per the emergency      department physician, the patient was unable to stand independently      and therefore unable to ambulate.  2. Hyperglycemia.  The patient has known type 2 diabetes mellitus.  He      has had his medications adjusted over the past few weeks.  Actos      and metformin have  been discontinued, and the patient was recently      started on Januvia and Amaryl.  He takes Lantus chronically along      with Apridra in a sliding scale regimen.  3. Azotemia.  The patient's blood urea nitrogen and creatinine are      slightly elevated.  He is treated chronically with Lasix and      spironolactone secondary to anasarca associated with cirrhosis.  4. Dysuria.  The patient's urinalysis is not suggestive of infection.  5. Thrombocytopenia.  The thrombocytopenia more than likely is      secondary to cirrhosis.  6. Cirrhosis secondary to nonalcoholic steatohepatitis.  This is      stable for now.  His liver transaminases are within normal limits.  7. Chronic atrial fibrillation.  The patient's heart rate is well      controlled on digoxin.  He is no longer on Coumadin secondary to a      history of  upper gastrointestinal bleeding.  8. Crohns disease.  The patient is treated chronically with Pentasa.   PLAN: 1. The patient will be admitted for further evaluation and management.  2. We will check an MRI of the lumbar spine for further evaluation.  3. We will treat his pain with as needed morphine.  We will also add a      short prednisone dose taper.  4. We will check a stat digoxin level to rule out toxicity.  5. We will assess his TSH.  6. Continue chronic home medications.  7. We will follow his renal function and CBC.  8. We will add sliding scale NovoLog as the patient's capillary blood      sugars are expected to increase on prednisone.  Elliot Cousin, M.D.  Electronically Signed     DF/MEDQ  D:  02/03/2006  T:  02/03/2006  Job:  [id number]   cc:   Kirk Ruths, M.D.  Fax: [phone]   R. Roetta Sessions, M.D.  [id number]  [place]  [place] [address]

## 2010-05-22 NOTE — Op Note (Signed)
NAMELEEVI, CULLARS                ACCOUNT NO.:  [id number]   MEDICAL RECORD NO.:  [id number]          PATIENT TYPE:  AMB   LOCATION:  DAY                           FACILITY:  [facility]   PHYSICIAN:  R. Roetta Sessions, M.D. DATE OF BIRTH:  [birth year]   DATE OF PROCEDURE:  07/14/2005  DATE OF DISCHARGE:                                 OPERATIVE REPORT   PROCEDURE PERFORMED:  Esophagogastroduodenoscopy with esophageal band  ligation.   INDICATIONS FOR PROCEDURE:  The patient is a 65 year old gentleman with a  history of cirrhosis secondary to NASH, who has a history of upper  gastrointestinal bleeding secondary to esophageal varices.  He has been in  previously, last session was in February of this year.  He has done well.  He is back for follow-up.  This approach has been discussed with the patient  at length, potential risks, benefits and alternatives have been reviewed and  questions answered.  The patient is agreeable.  Please see documentation in  the medical record.   Oxygen saturations, blood pressure, pulse and respirations were monitored  throughout the entirety of the procedure.   CONSCIOUS SEDATION:  Versed 3 mg IV, Demerol 75 mg IV in divided doses.  Cetacaine spray for topical pharyngeal anesthesia.   INSTRUMENT USED:  Olympus video chip 180 system.   FINDINGS:  Examination of the tubular esophagus revealed two columns of  grade 2 esophageal varices.  One of them had a cherry red spot overlying it  distally.  Esophageal mucosa otherwise appeared normal.  Esophagogastric  junction easily traversed.   Stomach:  The gastric cavity was empty and insufflated well with air.  Thorough examination of the gastric mucosa including retroflex view of the  proximal stomach, esophagogastric junction demonstrated diffuse snake-  skinning or fish scale appearance of the stomach consistent with portal  gastropathy, more pronounced in the fundus.  Fundal mucosa was quite  friable.   Pylorus was patent and easily traversed.  Examination of the bulb,  second and third portion revealed no abnormalities.   THERAPY/DIAGNOSTIC MANEUVERS PERFORMED:  The scope was removed.  The [employer] 7-shot bander was attached to the scope.  The scope was  reintroduced into the esophagus.  A total of three bands were placed on the  two columns of grade 2 varices.  The cherry red spot was ligated directly  with one band.  Good hemostasis was maintained.  The patient tolerated the  procedure well was reacted in endoscopy.   IMPRESSION:  Persisting two columns of grade 2 esophageal varices with one  cherry red spot on one of these columns distally.  Otherwise normal  esophagus, status post esophageal band ligation.  Hopefully, this will be  the last session he will need.  Small hiatal hernia, changes of the gastric  mucosa consistent with portal gastropathy, friability of the proximal  gastric mucosa.  Otherwise normal-appearing stomach, D1-D2.   RECOMMENDATIONS:  Continue present medications, including Propranolol. He  will follow up with [place] in three months and he is urged to keep his follow-up  appointment down at [facility] in the transplant clinic.      Jonathon Bellows, M.D.  Electronically Signed     RMR/MEDQ  D:  07/14/2005  T:  07/14/2005  Job:  [id number]   cc:   Veverly Fells. [place], M.D.  Fax: [phone]   Kirk Ruths, M.D.  Fax: [phone]   [facility] transplant clinic, [place]

## 2010-05-22 NOTE — Letter (Signed)
December 09, 2005    R. Roetta Sessions, M.D.  [address]  [place], [place] [address]   RE:  [name], [name]  MRN:  [id number]  /  DOB:  18-Jul-1945   Dear Kathlene November:   It was a pleasure reassessing Mr. Buttram at your request.  As you know,  he has had increasing ascites prompting a recent paracentesis.  Edema  has not been markedly increased in his legs, but he has a large area of  denuded skin over the left shin.  This has been painless and not  obviously infected.  He reports no increasing dyspnea, orthopnea nor  PND.   Current medications include:  1. Simvastatin 40 mg daily.  2. TriCor 145 mg daily.  3. Pentasa 1 g q.i.d.  4. Vitamin B12 injections.  5. Actos 45 mg daily.  6. Lantus insulin 30 units q.h.s.  7. Propranolol 20 mg b.i.d.  8. Metformin 500 mg b.i.d.  9. Aldactone 25 mg daily.  10.Furosemide 80 mg daily.   EXAMINATION:  GENERAL:  A pleasant gentleman with obvious ascites in no  acute distress.  VITAL SIGNS:  The weight is 317, nearly 30 pounds more than in July.  Blood pressure 115/70, heart rate 76 and irregular, respirations 20.  NECK:  Mild jugular venous distention; normal carotid upstrokes without  bruits.  LUNGS:  Clear.  CARDIAC:  Normal first and second heart sounds; nearly holosystolic  murmur at the left sternal border.  ABDOMEN:  Distended; organomegaly not appreciated; no masses; dull to  percussion.  EXTREMITIES:  Large denuded area over the left shin with a diameter of  approximately 4 inches.  Healthy granulation tissue.  Edema 2+ with  chronic stasis changes.   IMPRESSION:  [name] has progressive weight gain, mostly related to  ascites.  We will attempt to increase his aldactone, initially to 50 mg  b.i.d. with close monitoring of electrolytes.  Actos is associated with  fluid retention and will be discontinued.  Metformin cannot continue to  be used in the face of his renal insufficiency - his last creatinine was  1.6.  Diabetic control will  be assessed with a hemoglobin A1c level.  I  will follow this gentleman closely for the time being and plan to see  him again in 2 weeks.   The reason for the skin sloughing over the left shin is unclear.  He has  been prescribed a course of cephalexin by Dr. Regino Schultze and will fill this  prescription.  I have asked the wound care service at the hospital to  assess this area of ulceration and to recommend appropriate local  therapy.   Pneumococcal vaccine is up-to-date.  We will administer influenza  vaccine.    Sincerely,      Gerrit Friends. Dietrich Pates, MD, [facility]  Electronically Signed    RMR/MedQ  DD: 12/09/2005  DT: 12/09/2005  Job #: [id number]   CC:    Kirk Ruths, M.D.

## 2010-05-22 NOTE — Op Note (Signed)
NAME:  [name], [name]                          ACCOUNT NO.:  [id number]   MEDICAL RECORD NO.:  [id number]                   PATIENT TYPE:  INP   LOCATION:  A220                                 FACILITY:  [facility]   PHYSICIAN:  R. Roetta Sessions, M.D.              DATE OF BIRTH:  1945-01-16   DATE OF PROCEDURE:  08/07/2002  DATE OF DISCHARGE:                                  PROCEDURE NOTE   PROCEDURE:  Diagnostic esophagogastroduodenoscopy.   ENDOSCOPIST:  Gerrit Friends. Rourk, M.D.   INDICATIONS FOR PROCEDURE:  The patient is a 65 year old gentleman with  diabetes, chronic atrial fibrillation on Coumadin and small-bowel Crohn's  disease admitted to the hospital with melena, and significant drop in his  hemoglobin.  He has been in the ICU for close monitoring/treatment.  Hemoglobin this morning is 9.4 after 4 units of blood. His INR was 3.9 early  this morning and he has been given vitamin K.  He has had no more evidence  of bleeding.  EGD is now being done, this afternoon, to further evaluate the  etiology of his GI bleed.  This approach has been discussed with [name]  previously.  The potential risks, benefits, and alternatives have been  reviewed; questions answered; he is agreeable.  Please see my documentation  in the medical record.   PROCEDURE NOTE:  O2 saturation, blood pressure, pulse and respirations were  monitored throughout the entire procedure.  Conscious sedation: Versed 3 mg  IV, Demerol 75 mg IV in divided doses.   INSTRUMENT:  Olympus video chip adult gastroscope.   FINDINGS:  Examination of the tubular esophagus revealed 5 columns of grade  1-2 esophageal varices involving the distal 5 cm of the tubular esophagus.  The esophageal mucosa otherwise appeared normal.  There is no bleeding  stigmata.  The EG junction was easily traversed.   STOMACH:  The gastric cavity was empty.  It insufflated well with air.  A  thorough examination of the gastric mucosa including a  retroflex view of the  proximal stomach and esophagogastric junction demonstrated a fish-scale or  snake skinning appearance of the gastric mucosa diffusely.  There was no  evidence of infiltrating process, ulcer, or other bleeding lesion.  The  pylorus was patent and easily traversed.   Examination of the bulb and the second portion revealed no abnormalities.   THERAPEUTIC/DIAGNOSTIC MANEUVERS:  None.   The patient tolerated the procedure well and was reacted in endoscopy.   IMPRESSION:  1. Five columns of grade 1-2 esophageal varices without bleeding stigmata.     Otherwise normal esophagus.  2. Snake skinning or fish tail appearance of the gastric mucosa consistent     with portal gastropathy.  The remainder of the gastric mucosa appeared     normal.  3. Normal D1 and D2.   Today's findings are highly suggestive of occult liver disease, but they do  not explain his recent GI bleed.   RECOMMENDATIONS:  1. Proceed with a colonoscopy on August 08, 2002.  I have discussed the     potential risks and benefits and the patient understands.  Dr. Karilyn Cota     will be performing the procedure.  2. Check liver function studies and obtain a hepatic ultrasound.  3. Repeat ProTime and H&H tomorrow morning.  4. Further recommendations to follow.                                               Jonathon Bellows, M.D.    RMR/MEDQ  D:  08/07/2002  T:  08/07/2002  Job:  [id number]   cc:   Madelin Rear. Sherwood Gambler, M.D.  [address]  [place]  [place] [address]  Fax: [phone]

## 2010-05-22 NOTE — H&P (Signed)
NAMETREVARIS, PENNELLA                ACCOUNT NO.:  [id number]   MEDICAL RECORD NO.:  [id number]          PATIENT TYPE:  INP   LOCATION:  A217                          FACILITY:  [facility]   PHYSICIAN:  Madelin Rear. Sherwood Gambler, MD  DATE OF BIRTH:  January 17, 1945   DATE OF ADMISSION:  01/25/2004  DATE OF DISCHARGE:  LH                                HISTORY & PHYSICAL   CHIEF COMPLAINT:  Melena.   HISTORY OF PRESENT ILLNESS:  The patient noticed this morning melanotic  stools with progressively increasing malaise and fatigue and easy  fatigability.  He denies any hematemesis, hematochezia, melena, or abdominal  pain.  He has had no fever, rigors, chills, cough, sputum production, or  shortness of breath.   PAST MEDICAL HISTORY:  1.  Diabetes mellitus.  2.  Known nonalcoholic steatohepatitis pending full evaluation and being      screened for a liver transplant.  3.  Atrial fibrillation, chronic, maintained on long-term anticoagulant      therapy.  4.  Hyperlipidemia.   SOCIAL HISTORY:  Noncontributory.   FAMILY HISTORY:  Noncontributory.   REVIEW OF SYSTEMS:  As per HPI.  All else negative.   PHYSICAL EXAMINATION:  GENERAL:  He is pale.  HEAD AND NECK:  No JVD or adenopathy.  Neck is supple.  CHEST: Clear.  CARDIAC:  Irregularly irregular rhythm.  No gallop or rub.  ABDOMEN:  Soft.  No organomegaly or masses.  EXTREMITIES:  Without clubbing, cyanosis.  Positive 2+ bipedal edema.  NEUROLOGIC: No asterixis or encephalopathic signs.  Cranial nerve function  appears grossly intact as does all four upper extremity strength.   LABORATORY DATA:  Severe anemia, progressive since last hemoglobin and  hematocrit, down to 6.3% hemoglobin. He has a normal white count and a  mildly diminished platelet count.  No left shift.  Electrolytes are  remarkable for BUN of 28, creatinine 1.7, mild hyperglycemia at 171.  Liver  function tests are normal with the exception of hypoalbuminemia at 2.6.  No  radiographs are available for review at this time.  The ____________  physician reports hemoccult-positive stool.   IMPRESSION:  Upper gastrointestinal bleed, presumably secondary to portal  hypertension and a previously known gastropathy.  He will be transfused.  He  is hemodynamically stable.  Monitor on telemetry for any change in his  atrial fibrillation.  Gastrointestinal consultation will be sought.  Due to  excessive anticoagulation with an INR greater than 4, will hold his  Coumadin.  If he starts deteriorating, will consider also administering  vitamin K for reversal.      LJF/MEDQ  D:  01/25/2004  T:  01/25/2004  Job:  [id number]

## 2010-05-22 NOTE — Consult Note (Signed)
[name], [name]                ACCOUNT NO.:  [id number]   MEDICAL RECORD NO.:  [id number]          PATIENT TYPE:  INP   LOCATION:  A313                          FACILITY:  [facility]   PHYSICIAN:  Gerrit Friends. Dietrich Pates, MD, FACCDATE OF BIRTH:  1946/01/01   DATE OF CONSULTATION:  08/12/2005  DATE OF DISCHARGE:                                   CONSULTATION   REFERRING PHYSICIAN:  Dr. Regino Schultze   PRIMARY CARDIOLOGIST:  Dr. Dietrich Pates   HISTORY OF PRESENT ILLNESS:  A 65 year old gentleman with chronic atrial  fibrillation and known pulmonary hypertension presents with increased  dyspnea.  [name] has been followed for atrial fibrillation and fluid  retention.  He underwent cardiac catheterization in 2006 at [facility].  This study reportedly demonstrated normal LV function, normal coronary  arteries, and moderate pulmonary hypertension.  His previous echocardiograms  have been consistent with these findings.  He has required chronic diuretic  therapy, predominantly for pedal edema which has been impressive at times.  He also has cirrhosis with substantial liver disease and thus has a fairly  limited exercise tolerance.  He has been evaluated for hepatic transplant,  but has not been assigned adequate priority to receive a donor organ.  He  has known esophageal varices.   Other medical problems include diabetes, hypertension, Crohn's disease, and  a history of upper GI bleeding.  He has undergone cholecystectomy in the  past.   Medications prior to admission are extensive and as listed in the hospital  chart and in our office chart.   SOCIAL HISTORY:  Disabled; divorced and lives in [place] with his sister;  one adult child.   FAMILY HISTORY:  Mother died at an advanced age with CHF; father died at a  young age with myocardial infarction.  Multiple brothers, no significant  coronary disease; there is a positive family history for diabetes and  hypertension.   REVIEW OF SYSTEMS:   Notable for recent weight gain of 10-20 pounds,  intermittent headaches, the need for corrective lenses, episodic  palpitations, class II dyspnea on exertion, urinary frequency, depression,  intermittent abdominal pain and nausea attributed to Crohn's disease.   PHYSICAL EXAMINATION:  GENERAL:  Pleasant, pale gentleman in no acute  distress.  VITAL SIGNS:  Temperature is 97.9, heart rate 55 and irregular, respirations  16, blood pressure 115/65, weight 302.  Last weight in the office was 289  approximately a month ago.  O2 saturation is 100% on 2 L.  HEENT:  Anicteric sclerae; normal EOMs; normal oral mucosa.  NECK:  No jugular venous distension.  Normal carotid upstrokes without  bruits.  LUNGS:  Clear.  CARDIAC:  Normal first and second heart sounds; modest systolic murmur.  ABDOMEN:  Soft and nontender; ascites not appreciated; no organomegaly.  EXTREMITIES:  1+ pretibial edema; chronic stasis changes; distal pulses  intact.   EKG:  Atrial fibrillation with slow ventricular response; frequent PVCs;  nonspecific ST-T wave abnormality.   CT scan of the chest showed no important abnormalities other than mild  ascites.  Echocardiogram reveals normal LV systolic function and  no  significant valvular abnormalities.  There is right-sided cardiac  enlargement as well as biatrial enlargement.   Other laboratory notable for a white count of 2.9, hemoglobin of 10.7,  platelets of 86,000.  Creatinine of 1.5, D-dimer 1.75, and BNP level of 137.  Cardiac markers were normal.   IMPRESSION:  [name] presents with the sudden onset of dyspnea at rest.  Initial evaluation reveals no apparent etiology other than his substantial  weight gain.  Nonetheless, he has not put much fluid into his chest or to  his abdomen.  He certainly does not have coronary disease.  A CT scan of the  chest is negative but the possibility of pulmonary embolus will need to  continue to be considered.  At least low  molecular weight heparin is  appropriate.  We will proceed empirically with diuresis to see if this  improves his symptoms.  Pulmonary hypertension may be playing a role but  there is no reason why this should have created acute dyspnea.  We  appreciate the request for consultation and will be happy to continue to  follow this nice gentleman with you.      Gerrit Friends. Dietrich Pates, MD, [facility]  Electronically Signed     RMR/MEDQ  D:  08/12/2005  T:  08/12/2005  Job:  [id number]

## 2010-05-22 NOTE — Consult Note (Signed)
NAMEMARCOANTONIO, [name]                ACCOUNT NO.:  [id number]   MEDICAL RECORD NO.:  [id number]          PATIENT TYPE:  INP   LOCATION:  1444                         [facility]ACILITY[facility]   PHYSICIAN:  Stefani Dama, M.D.  DATE OF BIRTH:  November 21, 1945   DATE OF CONSULTATION:  02/05/2006  DATE OF DISCHARGE:                                 CONSULTATION   REQUESTOR:  Dr. Eda Paschal.   REASON FOR REQUEST:  T10 fracture.   HISTORY OF PRESENT ILLNESS:  [name] is a 65 year old individual  who apparently had a fall about a month ago.  He was seen by his  physician and some x-rays demonstrated a fracture around the T10  vertebra.  He was advised regarding conservative care.  The patient  notes that over a few weeks his pain did not improve; in fact, it  worsened and this past Monday, he started to feel increasing weakness in  his lower extremities.  He was seen on Wednesday and hospitalized here  for further evaluation and management.  Significant in the patient's  past medical history is that he has portal hypertension secondary to  cirrhosis of the liver; the exact cause is not exactly known.  The  patient also has atrial fibrillation and he had been on Coumadin until 2  years ago, when he had substantial bleeding from varices secondary to  portal hypertension.  His other conditions include type 2 diabetes  mellitus, history of Crohn's disease since 1997, remote duodenal ulcer,  hyperlipidemia, anemia of chronic disease, nephrolithiasis with a  history of stone removal in the past.   CURRENT MEDICATIONS:  1. Apidra  2. Sliding-scale Lantus insulin 50 units subcutaneous nightly.  3. Amaryl 4 mg a day.  4. Januvia 100 mg a day.  5. Actos and metformin have been discontinued secondary to renal      insufficiency.  6. Lanoxin 250 mcg daily.  7. Propranolol 20 mg b.i.d.  8. Diovan 80 mg a day.  9. Simvastatin 80 mg nightly.  10.Spirolactone 50 mg b.i.d.  11.Lasix 40 mg b.i.d.  12.Cymbalta  60 mg a day.  13.TriCor 145 mg daily.  14.Pentasa 500 mg two tablets q.i.d.  15.Aranesp one injection every 3 weeks.  16.Vitamin B12 injections monthly.   ALLERGIES:  He notes an allergy to CODEINE.   SOCIAL HISTORY:  The patient notes that he is divorced.  He lives in  [place].  He has 2 son.  Her has been disabled since 2002.  He denies  any alcohol or illicit drug use.   PHYSICAL EXAMINATION:  GENERAL:  He is an alert, oriented, moderately  obese individual.  BACK:  Palpation and percussion on his back does not reproduce any  tenderness; however, turning onto his side is rather painful for the  patient, particularly complaining of right lower abdominal and flank  pain.  The patient notes that since the onset of weakness, the pain  seems to have shifted into the lower portion of his back also.  NEUROLOGIC:  Exam reveals that his motor strength is good in the lower  extremities with  4/5 strength in the iliopsoas, 5/5 strength in  quadriceps, tibialis anterior the gastrocs.  Tone and bulk are normal.  Reflexes are absent the patellae and Achilles; they are 1+ in the biceps  and triceps.  Cranial nerve examination is within the limits of normal.  Sensation in the lower extremities distally below the level of the  abdomen is within the limits of normal.  The patient reports some  dysesthetic sensation in the right lower quadrant of the abdomen on the  skin surface.   IMPRESSION:  The patient has a T10 fracture which appears to be  consistent with Chance fracture by the CT criteria.  At this point, the  patient is an extremely poor surgical risk because of his history of  cirrhosis, portal hypertension and atrial fibrillation, in addition to  his history of diabetes.  Nonetheless, I do not feel as it is a good  idea to ambulate the patient; I believe that he should be maintained at  bedrest.  I have also advised transfer to [facility]; we will  arrange for this today.   The patient will require an MRI of the thoracic  spine.  We will have to carefully plan surgical intervention as this  patient is at high risk because of his surgical comorbidities.  We will  proceed cautiously.  The situation was discussed with Dr. Eda Paschal.      Stefani Dama, M.D.  Electronically Signed     [name]  D:  02/05/2006  T:  02/05/2006  Job:  [id number]

## 2010-05-22 NOTE — Discharge Summary (Signed)
NAMEGUSTAVE, LINDEMAN                ACCOUNT NO.:  [id number]   MEDICAL RECORD NO.:  [id number]          PATIENT TYPE:  INP   LOCATION:  A201                          FACILITY:  [facility]   PHYSICIAN:  Lionel December, M.D.    DATE OF BIRTH:  06/03/1945   DATE OF ADMISSION:  02/12/2005  DATE OF DISCHARGE:  02/11/2007LH                                 DISCHARGE SUMMARY   DISCHARGE DIAGNOSES:  1.  Four units of gastrointestinal bleed either from esophageal varices or      portal gastropathy.  2.  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  3.  Prior history of esophageal variceal bleed with banding (January 2006      and February 2006).  4.  Portal hypertensive gastropathy, stage II.  5.  Acute on chronic anemia.  6.  Small bowel Crohn's disease, clinically in remission.  7.  History of atrial fibrillation.  The patient remained in normal sinus      rhythm during this hospitalization.  8.  Diabetes mellitus.  9.  Hypertension.  10. Hyperlipidemia.  11. History of B12 deficiency.  12. Obesity with body mass index of 38.3.   CONDITION ON DISCHARGE:  Improved.   PROCEDURE:  Esophagogastroduodenoscopy with esophageal variceal banding by  Dr. Karilyn Cota on February 13, 2005.   DISCHARGE MEDICATIONS:  1.  Lanoxin 0.25 mg daily.  2.  Lasix 40 mg b.i.d.  3.  Prilosec 20 mg p.o. q.a.m. until he can pick up samples of Protonix from      the office which will be 40 mg q.a.m.  4.  Zocor 40 mg daily.  5.  Tri-Chlor 145 mg daily.  6.  Glucophage 1 g b.i.d.  7.  Lantus 30 units subcu nightly.  8.  Actos 45 mg daily.  9.  Apidra for sliding scale coverage as before.  10. Propranolol 20 mg b.i.d.  11. Pentasa 1 g four times a day.  12. Cymbalta 60 mg daily.  13. Diovan 80 mg daily.  14. B12 1 mg IM each monthly which the patient administers himself.   HOSPITAL COURSE:  Donyell is a 65 year old, Caucasian male with multiple  medical problems who also has biopsy-proven cirrhosis  secondary to NASH who  has been evaluated at [facility] and is a potential candidate for OLT  who showed up at the office with a history of melena.  He was sent to the  lab and his hemoglobin was 7.8 g.  He was therefore hospitalized.  He felt  weak, but he did not have hematemesis or abdominal pain.  As for his Crohns'  disease concern, he did not note any recent change in his bowel habits or  lower abdominal pain.  He has history of esophageal variceal bleed for which  he underwent banding twice last year, but has not had a recent endoscopic  evaluation.  The patient was admitted to concentrated care on telemetry.  His platelet count on admission was 122.  His INR was 1.2.  PTT was 31.  Glucose was 218, BUN 42, creatinine 1.4, sodium 133, potassium 4.4, chloride  103, CO2 26, calcium 9.0.  Total protein was 6.1 with albumin of 2.9,  bilirubin 0.8, Alk phos 50, AST 38, ALT 27.  The patient was type and cross  matched.  He was begun on IV fluids and octreotide infusion.  He was given 2  units of PRBCs.  He was begun on sliding scale coverage.  Lantus dose was  dropped to half and Glucophage and Actos were held as were other medications  except Lanoxin was continued.  He was also begun on IV Protonix.  His  hemoglobin only went up to 8.3 after 2 units of PRBCs.  He was therefore  given a third unit yesterday.  He underwent esophagogastroduodenoscopy on  the morning of February 13, 2005.  He had three columns of esophageal  varices which were grade 1 to grade 2, but distally two of these columns  were quite bulbous and were banded (each column banded once).  He also had  changes of portal gastropathy, but there is not active bleeding.  I opted to  band his varices since he has bled in the past and at risk to bleed again,  although I felt he could have bled either from varices and/or portal  gastropathy.  The patient was given a third unit of PRBCs yesterday.  His  hemoglobin this morning  was 9.1.  He was still feeling weak and having  postural symptoms and therefore decided to give him fourth unit of PRBCs.  He has not passed anymore melena.  In fact, his stool was heme negative.  The patient will have posttransfusion H&H and he will also have BUN and  creatinine prior to discharge.  His H. pylori serology is pending at this  time.   The patient has history of atrial fibrillation.  He previously had been on  Coumadin, but had multiple episodes of bleeding which was therefore  discontinued.  He is now on low dose enteric coated aspirin and I feel this  needs to be held for at least a couple of weeks, if not permanently.  His  follow up platelet count was 89,000.   His glucose level in the hospital remained high 100-200 range.  He was begun  on Glucophage.  He will resume his usual medications at home.  He will stay  on PPI as well.   The patient is scheduled to be seen by Dr. Mariel Sleet next week.  He will  have CBC along with any other blood work Dr. Mariel Sleet may want to do.   He will be seen in the office by Dr. Jena Gauss in 3 weeks.      Lionel December, M.D.  Electronically Signed     NR/MEDQ  D:  02/14/2005  T:  02/15/2005  Job:  [id number]   cc:   R. Roetta Sessions, M.D.  [address]  [place]  [place] [address]   Kirk Ruths, M.D.  Fax: [phone]   Ladona Horns. Mariel Sleet, MD  Fax: [phone]   Veverly Fells. Altheimer, M.D.  Fax: [phone]   Alfonse Alpers, M.D.  [facility]/GI/Hepatology Dept.

## 2010-05-22 NOTE — Procedures (Signed)
NAMEROBERTA, KELLY                ACCOUNT NO.:  [id number]   MEDICAL RECORD NO.:  [id number]          PATIENT TYPE:  INP   LOCATION:  A313                          FACILITY:  [facility]   PHYSICIAN:  Gerrit Friends. Dietrich Pates, MD, FACCDATE OF BIRTH:  [birth year]   DATE OF PROCEDURE:  08/12/2005  DATE OF DISCHARGE:                                  ECHOCARDIOGRAM   REFERRING:  Dr. Regino Schultze and Dr. Dietrich Pates.   CLINICAL DATA:  A 65 year old gentleman with dyspnea.  M-mode aorta 2.3,  left atrium 5.8, septum 1.8, posterior wall 1.4, LV diastole 5.0, LV systole  3.2, RV diastole 5.0.   1. Technically adequate echocardiographic study.  2. Mild to moderate right atrial enlargement; moderate left atrial      enlargement.  3. Mild to moderate right ventricular dilatation without hypertrophy;      normal RV systolic function.  4. Normal pulmonic valve and proximal pulmonary artery.  5. Mild sclerosis of a trileaflet aortic valve; trivial, if any, stenosis.  6. Normal diameter of the ascending aorta; mild calcification of the wall.  7. Normal mitral valve; mild annular calcification.  8. Normal tricuspid valve.  9. Normal left ventricular size; mild hypertrophy with disproportionate      septal thickening; normal regional and global function.      Gerrit Friends. Dietrich Pates, MD, [facility]  Electronically Signed     RMR/MEDQ  D:  08/12/2005  T:  08/12/2005  Job:  [id number]

## 2010-05-22 NOTE — Discharge Summary (Signed)
NAME:  [name], [name]                          ACCOUNT NO.:  [id number]   MEDICAL RECORD NO.:  [id number]                   PATIENT TYPE:  INP   LOCATION:  A216                                 FACILITY:  [facility]   PHYSICIAN:  Patrica Duel, M.D.                 DATE OF BIRTH:  06/09/1945   DATE OF ADMISSION:  09/30/2002  DATE OF DISCHARGE:  10/04/2002                                 DISCHARGE SUMMARY   DISCHARGE DIAGNOSES:  1. Possible food born illness/gastroenteritis.  2. Crohn's disease status post recent panendoscopy which revealed quiescent     disease.  3. Diabetes mellitus type 2.  4. Possible cirrhosis of the liver, questionable etiology.  5. Hypertension.  6. Atrial fibrillation with chronic coumadinization.  7. Status post cholecystectomy.   For details regarding admission, please refer to admitting note.  Briefly,  this 65 year old male with the above history was doing well until  approximately two days prior to presenting to the emergency department with  complaints of nausea, vomiting, watery diarrhea.  The day of admission he  developed bright red blood per rectum with questionable volume.  In the  emergency room, he was found to be febrile with a temperature of 100.7.  Otherwise, his vitals were stable. His hemodynamics were stable.  Laboratory  review revealed an H&H of 9.3 and 29.2.  Stools were heme-positive.  Potassium was 3.0, INR 3.1, sed rate of 41.  On rectal exam, he is noted to  be very tender with perirectal irritation and hemorrhoids.   The patient was admitted with the above complaints and for further  evaluation and therapy as indicated.   COURSE IN THE HOSPITAL:  The patient was hydrated prudently.  His sugars  were managed with sliding scale insulin.  Gastroenterology was consulted.  They felt  the management was appropriate.  No further workup was indicated  at this time.  Stool studies were obtained which were benign.  Given the  fact that he had  recently undergone panendoscopy this was not indicated at  this time.   The patient stabilized nicely.  His hemoglobin dropped to 8.4, but remained  stable at that point.  Transfusions were not indicated.  Aside from mild  weakness, the patient was doing very well and stable for discharge on the  fifth hospital day.   DISPOSITION:  The patient is to continue his home medications which include:  1. Cardizem CD 120 daily.  2. Lopressor 50 b.i.d.  3. Lanoxin 250 mcg daily.  4. Lopid 600 b.i.d.  5. Lasix 40 b.i.d.  6. Glucophage two b.i.d.  7. Amaryl 4 mg b.i.d.  8. Bentyl 10 t.i.d.  9. Lomotil CD two p.r.n.  10.      Coumadin 5 mg daily.  11.      Avandia 4 mg daily.  12.      Lantus insulin 10 units daily.  13.  Pentasa 250 mg 16 tabs daily.   He will be followed and treated expectedly as an outpatient.      ___________________________________________                                         Patrica Duel, M.D.   MC/MEDQ  D:  10/15/2002  T:  10/15/2002  Job:  [id number]

## 2010-05-22 NOTE — H&P (Signed)
[name], [name]                ACCOUNT NO.:  [id number]   MEDICAL RECORD NO.:  [id number]          PATIENT TYPE:  INP   LOCATION:  A217                          FACILITY:  [facility]   PHYSICIAN:  Stana Bunting, M.D.  DATE OF BIRTH:  September 24, 1945   DATE OF ADMISSION:  01/25/2004  DATE OF DISCHARGE:  LH                                HISTORY & PHYSICAL   HISTORY:  We are asked to see [name] in consultation by Dr. Madelin Rear.  Fusco for evaluation of melena.   HISTORY OF PRESENT ILLNESS:  [name] is a very-pleasant 65 year old white  male with a past medical history significant for Crohn's disease( Colitis)  as well as probable hepatic cirrhosis who was admitted today because of  melena.  He reports to me that over the last few months, he has had a  steadily-decreasing hemoglobin.  Recently, it was as low as in the mid 8  range. Earlier this week, it was down to 7.6.  At that time, he was begun on  iron supplementation by Dr. Ladona Horns. Neijstrom and has received three doses  of intravenous iron during the course of this week.  He was told that if he  developed any problems with melena, he should present for medical attention.  This occurred last night at approximately 2 a.m. and again this morning at 5  a.m.  He had the passage of two black bowel movements. He has not had any  nausea, vomiting or hematochezia.  Of note, prior to last night, he denied  any problems with melena.  This was not associated with any abdominal pain.  He denies any reflux problems, and denies any dysphagia.  His appetite is  good.  He has not had any weight loss, fever, chills, cough, chest pain or  syncope.  However, today he did report some presyncopal symptoms.  He  presented to the emergency department and was found to be hemodynamically  stable.  However, at this time, his hemoglobin is 6.3.  His INR is 4.7.  He  notes he had been on Coumadin for chronic atrial fibrillation, and his dose  was recently  changed.  He was called yesterday and told to hold his dose of  Coumadin.   He notes that he his Crohn's disease is relatively stable.  He has problems  with chronic diarrhea and has five to six loose stools per day, but for the  most part, does not have any nocturnal stools.  He denies any abdominal  pain, and further denies any extra intestinal manifestations of inflammatory  bowel disease.  He is maintained on Asacol.  He is otherwise without  complaint.   During the course of examining [name], I have reviewed the electronic  data base here at [facility].  These include upper endoscopy report  by Dr. Jonathon Bellows, dated August 07, 2002, which showed the patient to  have five columns of grade 1-2 esophageal varices.  He was also noted to  have portal hypertensive gastropathy, but no evidence of duodenal or gastric  varices.  He also underwent a colonoscopy on August 4 which showed several  erosions in the cecum.  There was also a few erosions in the transverse  colon, but the colon was otherwise normal.  He did have a small bowel  followthrough in August which did show thickening and narrowing of the  terminal ileum.  He also had a probable fistula formation.  He underwent  transjugular liver biopsy in August of 2004 as well which showed chronic  active hepatitis with mild steatosis.  There was increased fibrosis, and on  the report, they note bridging fibrosis.  There was a question of early,  active cirrhosis.   PAST MEDICAL HISTORY:  1.  Diabetes mellitus for approximately 20 years.  The patient notes he had      recently been started on insulin in the last year.  2.  Crohn's disease which apparently involved mainly the terminal ileum.  He      is currently maintained on Pentasa.  3.  Probably hepatic cirrhosis, although no etiology has been identified,      the patient certainly has multiple risk-factors for NASH.  4.  Hypertension.  5.  Atrial fibrillation,  maintained on Coumadin.  6.  Iron deficiency anemia with recent initiation of intravenous iron      infusion.  He also notes he has received blood transfusions both in      August of 2004, and most recently in December of 2005, at which time he      received two units of blood.   PAST SURGICAL HISTORY:  Status post cholecystectomy.   CURRENT MEDICATIONS:  The patient is unsure of all of his medications, but  the ones he can recall are:  1.  Pentasa 1 g four times a day.  2.  Lomotil, one tablet daily.  3.  Coumadin 3 mg, three days a week, and 4.5 mg four days a week.  He notes      he was told to top his Coumadin yesterday.  4.  Insulin.  He takes an unknown dose at night as well as immediate acting      insulin.  5.  Actos.  6.  Amaryl.  7.  Glucophage, 1 gram twice daily.  8.  Cardizem CD, 120 mg daily.  9.  The patient is unsure, but he believes he also takes Propranolol.  10. Vitamin B12 injections.  11. Iron infusion.   ALLERGIES:  CODEINE.   SOCIAL HISTORY:  The patient is divorced.  He currently does not work, but  previously worked in [employer].  He lives here in [place].  He  denies any history of alcohol or drug use.  There is no history of tobacco  use.   FAMILY HISTORY:  Negative for liver disease or colon cancer.  There is no  family history of inflammatory bowel disease.  He did have a brother who had  renal cell carcinoma.   REVIEW OF SYSTEMS:  Complete review of systems was performed and is negative  except as noted in the history of present illness.   PHYSICAL EXAMINATION:  VITAL SIGNS:  Temperature 98.5, pulse 91,  respirations 20, blood pressure 139/59.  The patient's weight is 287 pounds.  GENERAL:  This is a morbidly-obese white male who is alert and oriented.  He  is in no acute distress.  HEENT:  Atraumatic, normocephalic.  Sclerae anicteric.  His oropharynx is  clear.  There is no thrush.  NECK:  Supple. CARDIOVASCULAR:  Irregularly  irregular.  CHEST:  Clear to auscultation bilaterally.  ABDOMEN:  Obese, soft, nontender, nondistended, positive bowel sounds.  I do  not appreciate any organomegaly.  EXTREMITIES:  Warm.  He does have 2+ bilateral lower-extremity edema as well  as some skin changes consistent with chronic lower extremity edema.  NEUROLOGIC:  He is alert and oriented and moving all extremities.  There is  no asterixis.  His affect is appropriate.  SKIN:  There is no spider angiomas or palmar erythema.   LABORATORY DATA:  Shows a white count of 4.5, hemoglobin 6.3, with MCV of  86.  His platelet count is low at 136.  His INR is 4.7 with a PT of 31.5.  BUN 28, creatinine 1.7.  Total bilirubin is normal at 0.6.  AST 21.  ALT 17.  Albumin is low at 2.6.  Alkaline phosphatase is 67.   IMPRESSION/PLAN:  Mr. Scarlata is a 65 year old white male who is being  admitted to the hospital with anemia and melena in the setting of  significant coagulopathy.  At the present time, he is hemodynamically  stable, and is being admitted to the internal medicine service.  On the  bases of the history and examined performed, we will make the following  plan:  1.  With respect to the patient's melena, certainly there are a number of      etiologies for bleeding in the setting of this type of coagulopathy.      Although this certainly could be related to his Crohn's disease, it      equally likely could be related to his portal hypertensive gastropathy.      Based on the nature of this, this in some part appears to be chronic.      Thus, I suspect more Crohn's or portal hypertensive gastropathy.      Arterial venous malformation certainly are also in the differential.      Based on the presentation,  I am doubtful  this represents a variceal      bleed.  Regardless, we need to correct his coagulopathy and begin blood      transfusions.  Once this is done, he will need both an upper endoscopy      and likely a colonoscopy to  further evaluate this. I have discussed this      with him, and he is agreeable to doing this if needed.  2.  With respect to the patient's liver disease, it is unclear of the      etiology of this.  Before undertaking a large serological evaluation,  I      will check with Dr. Lionel December and Dr. Jonathon Bellows to see what      has been done to date.  Regardless, I do not think at this point the      patient is a liver transplant candidate based on his comorbidities and      his obesity.  3.  In the setting of a GI bleed possibly related to portal hypertension,      will being antibiotic coverage with a fluoroquinolone.  4.  The patient will be given a PPI.  5.  On the basis of his cirrhosis as well as diabetes, he will need close      following.  I will follow along over the next few days, as he may need      endoscopy.      RSS/MEDQ  D:  01/25/2004  T:  01/25/2004  Job:  [id number]

## 2010-05-22 NOTE — Procedures (Signed)
[name], [name]                ACCOUNT NO.:  [id number]   MEDICAL RECORD NO.:  [id number]           PATIENT TYPE:  EMS   LOCATION:  ED                            FACILITY:  [facility]   PHYSICIAN:  Edward L. Juanetta Gosling, M.D.DATE OF BIRTH:  Jul 13, 1945   DATE OF PROCEDURE:  08/31/2005  DATE OF DISCHARGE:                                EKG INTERPRETATION   DATE AND TIME:  7:35 on August 10, 2005.   INTERPRETATION:  The basic rhythm is atrial fibrillation.  There are  multiple PVCs.  There is an incomplete right bundle branch block.   Abnormal electrocardiogram.      Oneal Deputy. Juanetta Gosling, M.D.  Electronically Signed     ELH/MEDQ  D:  08/31/2005  T:  09/01/2005  Job:  [id number]

## 2010-05-22 NOTE — Op Note (Signed)
[name]                ACCOUNT NO.:  [id number]   MEDICAL RECORD NO.:  [id number]          PATIENT TYPE:  INP   LOCATION:  A201                          FACILITY:  [facility]   PHYSICIAN:  Lionel December, M.D.    DATE OF BIRTH:  30-Jan-1945   DATE OF PROCEDURE:  02/13/2005  DATE OF DISCHARGE:                                 OPERATIVE REPORT   PROCEDURE:  Esophagogastroduodenoscopy with esophageal variceal banding.   INDICATIONS:  [name] is a 65 year old Caucasian male with multiple medical  problems including biopsy-proven cirrhosis secondary to NASH with history of  variceal bleed 1 year ago, who underwent banding in January February 2006.  He has not had a recent survey for esophageal varices. He presented  yesterday with history of melena and hemoglobin of 8.1 grams. The patient  has received 2 units of PRBCs and hemoglobin, now, is 8.3 grams. His melena  has cleared. He is still on low-dose ASA. It is suspected that he is  bleeding from his upper GI tract; and, therefore,  undergoing a  diagnostic/therapeutic procedure. Procedure and risks were reviewed with the  patient and informed consent was obtained.   MEDICATIONS FOR CONSCIOUS SEDATION:  Cetacaine spray for oropharyngeal  topical anesthesia Demerol 25 mg IV Versed 8 mg IV.   FINDINGS:  Procedure performed in endoscopy suite. The patient's vital signs  and O2 saturations were monitored during procedure and remained stable. The  patient was placed in the left lateral recumbent position and the Olympus  videoscope was passed via oropharynx without any difficulty into esophagus.   Mucosa of the proximal and middle third was normal.. Distally there was  three columns of varices, grade 1-2, but distally, two columns were bulbous  or quite prominent; however, no erosions or red markings were noted. There  was two area of scarring of the distal esophagus from previous banding. GE  junction was at 43 cm from the incisors.   STOMACH:  It had some bile in it, but there was no food debris or blood.  The stomach distended very well with insufflation. The folds of the proximal  stomach were normal. Examination of mucosa revealed multiple cherry-red  spots at the body. Some of these were round and others were linear. There is  also mucosal snake skinning. Some of these areas had specks of fresh blood,  but on washing there was normal bleeding noted. Antral mucosa was spared.  Pyloric channel was patent. Angularis, fundus, and cardia were examined by  retroflexing the scope. No fundal varices were identified.   DUODENUM:  Bulbar mucosa was normal. Scope was passed into the second part  of the duodenum where the mucosa and folds were normal. Endoscope was  withdrawn.   Since the patient had too short bulbous columns distally, it was decided to  band these. A multiple banding device was loaded onto the scope and easily  passed across oropharynx into esophagus. The column on the left side was  initially treated. Using suction, this column was pulled into banding device  and was banded. Subsequently the one on the right  side or at 4 o'clock,  which was just above the other one, was also banded. There was some oozing,  but no brisk bleeding noted. Endoscope was withdrawn. The patient tolerated  the procedure well.   FINAL DIAGNOSIS:  1.  Three columns of grade 1-2 varices, but distally two of these columns      were bulbous and grade 3; and, therefore, were banded (two bands      applied).  2.  Portal gastropathy with extensive involvement of gastric body and      relative sparing of antrum.  3.  No evidence of peptic ulcer disease.   RECOMMENDATIONS:  1.  I would suggest that his ASA be discontinued.  2.  Carafate liquid 1 gm a.c. and q.h.s. for a total of 2 weeks.  3.  He will be given another unit of PRBCs to have a post transfusion H&H.  4.  Will resume his propranolol at 20 mg p.o. b.i.d..  5.  Other usual  medicines will be resumed in a.m..      Lionel December, M.D.  Electronically Signed     NR/MEDQ  D:  02/13/2005  T:  02/15/2005  Job:  [id number]   cc:   R. Roetta Sessions, M.D.  [id number]  [place]  [place] [address]   Kirk Ruths, M.D.  Fax: [phone]   Elfredia Nevins  Division of Hepatology  [facility]  [place]  [place]

## 2010-05-22 NOTE — H&P (Signed)
[name], [name]                ACCOUNT NO.:  [id number]   MEDICAL RECORD NO.:  [id number]          PATIENT TYPE:  AMB   LOCATION:  DAY                           FACILITY:  [facility]   PHYSICIAN:  R. Roetta Sessions, M.D. DATE OF BIRTH:  [birth year]   DATE OF ADMISSION:  DATE OF DISCHARGE:  LH                                HISTORY & PHYSICAL   CHIEF COMPLAINT:  History of bleeding esophageal varices, needs surveillance  EGD.   HISTORY OF PRESENT ILLNESS:  [name] is a pleasant 65 year old  Caucasian male with numerous medical problems including management of  cirrhosis, known esophageal varices and underwent esophageal band ligation  by Dr. Karilyn Cota back in February of this year.  He was scheduled for rehab  session two weeks ago but had a fever and was cancelled.  He has done very  well, however.  He has not had any melena, rectal bleeding or hematemesis.  No dysphagia, odynophagia.  Crohn's disease in remission on Pentasa 1 g  q.i.d.  He is having two to three bowel movements daily.  He is followed by  Dr. Leslie Dales for his diabetes.  Last hemoglobin A1c 7.9.  He is no longer  anemic according to Dr. Thornton Papas labs.  His last iron sat was 25% with  iron of 97 and IBC of 388.  Apparently the Aranesp was cut in half, he is  getting a dose every two weeks, last H&H from Dr. Altheimer's office of 12.4  and 38.1 back on May 26, 2005.  He has been vaccinated against hepatitis A  and B.  He is to see Dr. __________ at the [facility] this  summer.   PAST MEDICAL HISTORY:  1.  Decompensated NASH with cirrhosis.  2.  Esophageal varies.  He is followed by Dr. __________ at [facility].  He is currently not listed for transplantation.  3.  History of small-bowel Crohn's disease in remission.  4.  He has had multiple EGD's with banding session previously by myself, Dr.      Karilyn Cota and Dr. Stana Bunting.  5.  History of iron-deficiency anemia resolved,  currently.  6.  History of B12 deficiency.  7.  History of GI bleeding in setting of Coumadin and aspirin.  Coumadin and      aspirin have been stopped.  I do not see the last colonoscopy report in      our chart at this time.   CURRENT MEDICATIONS:  1.  Lanoxin 0.25 mg daily.  2.  Propranolol 20 mg b.i.d.  3.  Lasix 40 mg b.i.d.  4.  Zocor 40 mg daily.  5.  Tricor 145 mg at bedtime.  6.  Glucophage 500 mg two tablets twice daily.  7.  Actos 45 mg daily.  8.  Lantus insulin daily.  9.  Apidra p.r.n.  10. Pentasa 1 g q.i.d.  11. Lomotil p.r.n.  12. Cymbalta 60 mg daily.  13. Diovan 160 mg daily.  14. Vitamin B12 monthly.   ALLERGIES:  TYLENOL NO. 3.  FAMILY/SOCIAL HISTORY:  Noncontributory.  He does not consume alcohol.   REVIEW OF SYSTEMS:  No recent chest pain, dyspnea on exertion, no current  fever or chills.   PHYSICAL EXAMINATION:  GENERAL APPEARANCE:  A pleasant 65 year old gentleman  resting comfortably.  VITAL SIGNS:  Weight 295.5, height 6 feet 1 inch, temperature 97.8, blood  pressure 118/70, pulse 76.  SKIN:  Warm and dry.  CHEST:  Lungs are clear to auscultation.  CARDIOVASCULAR:  Irregularly irregular rate and rhythm without murmurs,  rubs, or gallops.  ABDOMEN:  Obese, positive bowel sounds.  No obvious mass or organomegaly,  although exam is suboptimal secondary to body habitus.  EXTREMITIES:  Trace lower extremity edema.   IMPRESSION:  [name] is a pleasant 65 year old gentleman with  multiple medical problems NASH, portal hypertension, known esophageal  varies, last banding session was in February.  He has done extremely well  overall.  He really needs to have whatever varices remain obliterated.  To  this end, we will get him back on track for a follow-up EGD in the very near  future.  Potential risks, benefits, and alternatives have been reviewed.  Patient's questions were answered.  He is agreeable.   ADDENDUM:  Last colonoscopy according  to our records was August 08, 2002,  done by Dr. Karilyn Cota which revealed diffuse cecal erosions.  Otherwise,  reported to be negative.      Jonathon Bellows, M.D.  Electronically Signed     RMR/MEDQ  D:  06/21/2005  T:  06/21/2005  Job:  [id number]   cc:   Kirk Ruths, M.D.  Fax: [phone]

## 2010-05-22 NOTE — H&P (Signed)
NAMEKRISH, BAILLY                ACCOUNT NO.:  [id number]   MEDICAL RECORD NO.:  [id number]          PATIENT TYPE:  INP   LOCATION:  A313                          FACILITY:  [facility]   PHYSICIAN:  Kirk Ruths, M.D.DATE OF BIRTH:  July 31, 1945   DATE OF ADMISSION:  08/10/2005  DATE OF DISCHARGE:  LH                                HISTORY & PHYSICAL   CHIEF COMPLAINT:  Shortness of breath.   HISTORY OF PRESENT ILLNESS:  This is a 65 year old male with multiple  medical problems including NASH. The patient is transplant list at [facility] for liver transplant. The patient was seen by Dr. Jena Gauss locally. On  this occasion, he had been having several day history of progressive  shortness of breath. Even though chest x-ray and CT scan of the chest were  normal, it was felt the patient's growing ascites was contributing to his  shortness of breath, so he was admitted for hopefully a therapeutic  paracentesis.   PAST MEDICAL HISTORY:  He is an insulin-dependent diabetic. He currently  takes Lantus 30 units each evening with Glucophage 1 g twice a day, Amaryl 4  mg daily, Actos 45 mg daily. The patient has coronary artery disease for  which he takes Lanoxin 0.25 daily, propranolol 20 mg twice a day, Diovan 160  mg daily, Lasix 40 mg twice a day, Zocor 40 mg daily, and TriCor 145 daily  for hyperlipidemia. The patient says he also has Crohn's disease which at  this time is controlled with Pentasa 250 mg 4 tablets 4 times a day. The  patient also has hypertension and history of atrial fibrillation.   REVIEW OF SYSTEMS:  Denies nausea or vomiting or diarrhea. He does state he  has some pain on deep inspiration and the above-mentioned shortness of  breath.   SOCIAL HISTORY:  He is a nonsmoker, nondrinker. He is disabled due to his  multiple medical problems.   PHYSICAL EXAMINATION:  GENERAL:  Middle-aged male who appears mildly  dyspneic. He is afebrile. Blood pressure 130/60, pulse is  80 and regular,  respirations 22 and somewhat labored. O2 saturation is 99% on room air.  HEENT:  TMs are normal. Pupils are equal and reactive to accommodation.  Oropharynx benign.  NECK:  Supple without JVD, bruit or thyromegaly.  LUNGS:  Clear in all areas.  HEART:  Regular sinus rhythm without murmur, gallop or rub.  ABDOMEN:  Pendulous with some moderately tense ascites.  EXTREMITIES:  Without clubbing or cyanosis. There is a trace of edema.  NEUROLOGICAL:  Grossly intact.   ASSESSMENT:  1. Dyspnea.  2. Ascites.  3. End-stage liver disease.  4. Liver transplant.  5. Coronary artery disease.  6. History of Crohn's.  7. History of hypertension.  8. History of atrial fibrillation.  9. History of diabetes.      Kirk Ruths, M.D.  Electronically Signed     WMM/MEDQ  D:  08/11/2005  T:  08/11/2005  Job:  [id number]

## 2010-05-22 NOTE — Discharge Summary (Signed)
NAMEGORMAN, SAFI                ACCOUNT NO.:  [id number]   MEDICAL RECORD NO.:  [id number]          PATIENT TYPE:  INP   LOCATION:  A313                          FACILITY:  [facility]   PHYSICIAN:  Kirk Ruths, M.D.DATE OF BIRTH:  25-May-1945   DATE OF ADMISSION:  08/10/2005  DATE OF DISCHARGE:  08/11/2007LH                                 DISCHARGE SUMMARY   DISCHARGE DIAGNOSES:  1. Dyspnea.  2. Pulmonary edema.  3. End-stage liver disease.  4. Ascites.  5. Insulin-dependent diabetes mellitus.  6. History of hypertension.  7. History of Crohn's.   HOSPITAL COURSE:  This s a 65 year old male with multiple medical problems  who was admitted with persistent shortness of breath.  Although the  patient's oxygen saturations were adequate, it was felt the patient's  dyspnea was related to his ascites and he would require paracentesis.  Ultrasound of the abdomen showed minimal ascites in the lower abdomen.  Chest x-ray showed some cardiomegaly and some chronic bronchitic changes.  The patient was seen by Dr. Roetta Sessions from GI as well as Dr. Dietrich Pates  for cardiology.  Gastroenterology did not feel the minimal ascites was  causing the shortness of breath so paracentesis was not performed.  Dr.  Dietrich Pates saw the patient had negative cardiac enzymes, BNP was 137, slightly  elevated.  The patient was treated with some IV Lasix, had a good diuresis  and relief of his shortness of breath.  The patient's white count was 2900  on admission with hemoglobin of 10.7.  INR's were slightly out at 1.3.  D-  dimer was 1.75.  Electrolytes were within normal limits.  The patient also,  on admission, underwent CT scan angio of the chest to rule out pulmonary  embolus and it showed no acute thoracic abnormalities.  The patient was  breathing normally without supplemental oxygen.   DISCHARGE MEDICATIONS:  The patient will be discharged home on his previous  medications, which is a significant list.   Please see the H and P for that  list.  We will also give him a prescription for Aldactone 50 mg daily.   FOLLOWUP:  He will be followed in the office in one week.   CONDITION ON DISCHARGE:  The patient was stable at the time of discharge.      Kirk Ruths, M.D.  Electronically Signed     WMM/MEDQ  D:  08/14/2005  T:  08/14/2005  Job:  [id number]

## 2010-05-22 NOTE — H&P (Signed)
NAMETRAVARES, NELLES                ACCOUNT NO.:  [id number]   MEDICAL RECORD NO.:  [id number]          PATIENT TYPE:  AMB   LOCATION:                                FACILITY:  [facility]   PHYSICIAN:  R. Roetta Sessions, M.D. DATE OF BIRTH:  Sep 16, 1945   DATE OF ADMISSION:  DATE OF DISCHARGE:  LH                                HISTORY & PHYSICAL   OFFICE NOTE:   CHIEF COMPLAINT:  Needs LVAP.   SUBJECTIVE:  [name] is a 65 year old Caucasian male, patient of Dr.  Jena Gauss, with history of NASH cirrhosis.  He was last seen October 05, 2005, by  Dr. Jena Gauss.  He has developed anasarca.  He has 2+ lower extremity pitting  edema and he has also accumulated a significant amount of ascites.  Since he  was last seen, six weeks ago, he has gained 16-1/2 pounds.  He is  complaining of some shortness of breath on exertion.  He tells me he is  consuming a 2 gram sodium diet.  He is on Lasix 40 mg b.i.d., as well as  spironolactone 25 mg daily.  He has been seen by [facility] and  followed there for potential transplant as well.  Last basic metabolic panel  was from November 01, 2005.  He had a glucose of 171, a BUN of 31, potassium  was 5.0, he had a normal sodium at that time.  He has a history of  esophageal varices which have been banded previously.  He is on propranolol.  He denies any fever, has noticed some chills.  He tells me he has never had  an abdominal paracentesis performed.   CURRENT MEDICATIONS:  See the list from November 22, 2005.   ALLERGIES:  TYLENOL NO.3.   OBJECTIVE:  VITAL SIGNS:  Weight 232.5 pounds, height 73 inches, temp 97.7,  blood pressure 140/70, and pulse 72.  GENERAL:  [name] is a 65 year old,  pale-appearing, Caucasian male who is alert, oriented, pleasant, cooperative  and in no acute distress.  HEENT:  Sclerae are clear with mild icterus.  Conjunctivae are pale.  Oropharynx is pink and moist without any lesions.  CHEST:  Heart regular rate and rhythm,  normal S1-S2.  ABDOMEN:  Protuberant,  positive bowel sounds times four.  He does have tense ascites.  He has an  umbilical hernia which is easily reduced and nontender.  He has fluid wave  and unable to palpate hepatosplenomegaly, although exam is limited given  accumulated ascites.  EXTREMITIES:  Negative for liver flap.  He has 2+  pitting edema to bilateral posterior tibial area and ankles.   ASSESSMENT:  Nonalcoholic steatohepatitis cirrhosis with anasarca and 16-1/2  pound weight gain in the last six weeks.  He is on Lasix 80 mg daily as well  as spironolactone 25 mg daily.  He is going to need large volume abdominal  paracentesis for further evaluation and I have discussed this with Dr.  Jena Gauss.  He would like to have radiology perform this under ultrasound  guidance, since he has never had paracentesis previously.  PLAN:  1. LVAP by radiology with fluid sent for cytology, cell count with      differentiation, Gram's stain, aerobic and      anaerobic cultures in blood culture bottles, AFB culture and smear.  2. MET-7 today.  3. Continue current dose of Lasix and spironolactone until MET-7 is      reviewed.      Nicholas Lose, N.P.      Jonathon Bellows, M.D.  Electronically Signed    KC/MEDQ  D:  11/22/2005  T:  11/22/2005  Job:  [id number]   cc:   R. Roetta Sessions, M.D.  [address]  [place]  [place] [address]   Kirk Ruths, M.D.  Fax: [phone]

## 2010-05-22 NOTE — Op Note (Signed)
NAMEROLLEN, SELDERS                ACCOUNT NO.:  [id number]   MEDICAL RECORD NO.:  [id number]          PATIENT TYPE:  INP   LOCATION:  A217                          FACILITY:  [facility]   PHYSICIAN:  Stana Bunting, M.D.  DATE OF BIRTH:  11-09-1945   DATE OF PROCEDURE:  DATE OF DISCHARGE:                                 OPERATIVE REPORT   OPERATION/PROCEDURE:  Esophagogastroduodenoscopy.   ENDOSCOPIST:  Stana Bunting, M.D.   INDICATIONS:  [name] is a 65 year old white gentleman who has a known  history of cirrhosis.  He underwent upper endoscopy in August 2004 at which  time he was found to  have small esophageal varices.  He presented to the  hospital yesterday with melena in the setting of a profound coagulopathy (he  is on Coumadin).  His coagulopathy has been corrected but he developed  hematemesis this morning.  He has remained hemodynamically stable.  The  risks and benefits and alternatives have been explained to the patient and  he is agreeable with proceeding.  Of note, he was placed on antibiotic  prophylaxis prior to procedure and his coagulopathy has been corrected.   DESCRIPTION OF PROCEDURE:  Oxygen saturation, blood pressure, pulse, and  respiratory status were monitored throughout the entire procedure.  Conscious sedation was given and a total of Demerol 50 mg IV and Versed 6 mg  was performed.  Instrument is the Olympus gastroscope.   FINDINGS:  The proximal esophagus appeared normal.  In the distal esophagus  there were three columns of grade 2-3 esophageal varices although there was  no active bleeding.  One of these did have a red spot noted.  The GE  junction appeared normal without any evidence of a tear.  In the proximal  stomach, there was some retained clot which limited complete views.  However, there did not appear to be any active bleeding in the stomach.  The  antrum and the pylorus appeared normal.  Retroflexed views were obtained and  there was no  evidence of gastric varices.  The duodenum bulb appeared normal  as did the second portion of the duodenum.  No source of bleeding was  located in the duodenum.  The endoscope was subsequently withdrawn and the [facility] banding  device was attached.  The endoscope was then readvanced to the stomach and  slowly withdrawn.  A total of three bands were placed including including  one on the varix with the cherry red spot.  The patient tolerated the  procedure well and there were no complications.  At the end of the  procedure, there were no remaining varices that were amenable to endoscopic  therapy.   IMPRESSION:  1.  Esophageal varices one of which had a red spot,  treated with endoscopic      variceal ligation.  2.  Otherwise normal upper endoscopy, although limited views of the proximal      stomach were obtained.  3.  No evidence of portal hypertensive gastropathy or other mucosal source      of bleeding seen.   RECOMMENDATIONS:  1.  Post procedure orders have been written.  We will continue the patient      on antibiotics in the setting of probable variceal bleed.  He will be      placed on an octreotide infusion.  We will continue to follow his      coagulation status      and blood counts closely.  He will need a repeat upper endoscopy in      approximately six weeks to evaluate for residual varices.  2.  Will follow the patient closely while he is in the hospital.      RSS/MEDQ  D:  01/26/2004  T:  01/27/2004  Job:  [id number]

## 2010-05-22 NOTE — Procedures (Signed)
[name], [name]                ACCOUNT NO.:  [id number]   MEDICAL RECORD NO.:  [id number]          PATIENT TYPE:  INP   LOCATION:  A313                          FACILITY:  [facility]   PHYSICIAN:  Gerrit Friends. Dietrich Pates, MD, FACCDATE OF BIRTH:  [birth year]   DATE OF PROCEDURE:  DATE OF DISCHARGE:                                  ECHOCARDIOGRAM   DICTATION CANCELLED      Gerrit Friends. Dietrich Pates, MD, [facility]  Electronically Signed     RMR/MEDQ  D:  08/12/2005  T:  08/12/2005  Job:  [id number]

## 2010-05-22 NOTE — Consult Note (Signed)
NAMEYIFAN, AUKER                ACCOUNT NO.:  [id number]   MEDICAL RECORD NO.:  [id number]         PATIENT TYPE:  INP   LOCATION:  A313                          FACILITY:  [facility]   PHYSICIAN:  R. Roetta Sessions, M.D. DATE OF BIRTH:  07-Dec-1945   DATE OF CONSULTATION:  08/11/2005  DATE OF DISCHARGE:                                   CONSULTATION   REASON FOR CONSULTATION:  Ascites.   HISTORY OF PRESENT ILLNESS:  The patient is a 65 year old Caucasian  gentleman with a history of NASH cirrhosis, who is admitted with shortness  of breath.  He was actually seen in the emergency department twice  yesterday.  He states he woke up yesterday morning, around 3 a.m., was short  of breath.  Symptoms did not improve, so he went to the ER around 7 a.m.  He  was evaluated and improved and sent home.  Yesterday evening, symptoms  returned and he was advised to go back to the emergency department.  He says  he has had similar episodes in the past, which always resolve after he sits  up in the bed.  He also complained of mild dull abdominal pain with nausea  yesterday, which is new, but is now resolved.  He has noted more dark stools  lately, but no melena or fresh blood.  He is having three to four stools a  day.  He has a long history if anemia, currently receiving Aranesp every  three weeks by Dr. Mariel Sleet.  His hemoglobin on July 24 was 11.2.  He  denies any vomiting or chest pain.   In the ED, he had a CT chest angio, which was negative for pulmonary  embolus.  His O2 sats have been in the mid to upper 90s.  Cardiac enzymes  times one were negative.  D-dimer was 1.75.  Ascites noted on CT angio.   MEDICATIONS AT HOME:  1. Lantus 30 units q.h.s.  2. Lanoxin 0.25 mg daily.  3. Propranolol 20 mg twice daily.  4. Diovan 160 mg daily.  5. Lasix 40 mg twice daily.  6. Zocor 40 mg daily.  7. Tricor 145 mg q.h.s.  8. Glucophage 1000 mg twice daily.  9. Actos 45 mg daily.  10.Amaryl 4 mg  daily.  11.Pentasa 1 g q.i.d.  12.Cymbalta 60 mg daily.  13.Vitamin B12 1 cc monthly.  14.Apidra sliding scale.  15.Lomotil p.r.n.  16.Aranesp q. 3 weeks.   ALLERGIES:  CODEINE.   PAST MEDICAL HISTORY:  Atrial fibrillation.  He has been off Coumadin and  aspirin because of history of variceal bleed in 2006.  He has had multiple  bandings.  The last time was July 2007.  It was thought to be completed at  that time.  He also has a history of portal gastropathy.  Hypertension,  diabetes mellitus, NASH with cirrhosis, complicated by esophageal variceal  bleed.  Five colonoscopies in 2004.  He had few cecal erosions and one in  the transverse colon.  He has a history of small bowel Crohn's disease,  diagnosed in 26.  Has been  in good control.  History of remote duodenal  ulcer, nephrolithiasis, status post stone extraction.  Chronic anemia,  received parenteral iron in the past, and currently receiving Aranesp.  Status post cholecystectomy.   FAMILY HISTORY:  Negative for chronic GI illnesses, colorectal cancer.   SOCIAL HISTORY:  He is divorced.  He denies any tobacco or alcohol use.   REVIEW OF SYSTEMS:  See HPI for GI.  CARDIOPULMONARY:  No chest pain.  CONSTITUTIONAL:  He says six weeks ago, he weighed 283, and he weighs 302  now.  He has had worsening lower extremity edema.  Denies any heartburn.   PHYSICAL EXAMINATION:  VITAL SIGNS:  Temperature 98.7, pulse 85,  respirations 20, blood pressure 139/66, weight 302.2, height 73 inches.  GENERAL:  Pleasant, obese, Caucasian male, in no acute distress.  SKIN:  Warm and dry, no jaundice.  HEENT:  Conjunctivae are pale.  Sclerae are anicteric.  Oropharyngeal mucosa  moist and pink.  No lymphadenopathy or thyromegaly.  CHEST:  Lungs are clear to auscultation.  CARDIAC EXAM:  Reveals regular rate and rhythm, no murmurs, rubs or gallops.  ABDOMEN:  Positive bowel sounds, obese, but symmetrical.  Abdomen is very  soft.  No appreciated  fluid wave or shifting dullness to percussion.  The  abdomen is nontender.  LOWER EXTREMITIES:  Reveal 2+ pitting edema in the lower extremities  bilaterally.   LABORATORY DATA:  White count 2900, hemoglobin 10.7, hematocrit 31.3, MCV  94.8, platelets 86,000, INR 1.2.  Sodium 136, potassium 4.2, BUN 26,  creatinine 1.5, glucose 113.   IMPRESSION:  The patient is a 65 year old Caucasian gentleman with NASH  cirrhosis, who presents with acute onset shortness of breath yesterday.  He  has had short-lived episodes in the past.  CT angiogram negative for  pulmonary embolism.  There is reported ascites on CT angio, but on exam,  there is no evidence of significant or tense ascites.  Also did not  appreciate any findings suggestive of pleural effusions.  Question  possibility of congestive heart failure, not picked up on any imaging  studies.  Also in the differential would be hepatopulmonary syndrome  (intravascular pulmonary shunting), although symptoms are not classical.  At  this time, it does not appear that he has significant ascites, which would  explain his shortness of breath, however.   RECOMMENDATIONS:  1. Abdominal ultrasound to evaluate for ascites.  If ascites present, will      have ultrasound-guided paracentesis, fluid to be sent for cytology,      cell count with differential, Gram stain, aerobic and anaerobic      cultures in blood culture bottles and AFB culture and smear.  2. LFTs, PT/INR, BNP.  3. Pulse oximetry, flat and upright.  4. Further recommendations to follow.      Tana Coast, P.AJonathon Bellows, M.D.  Electronically Signed    LL/MEDQ  D:  08/11/2005  T:  08/11/2005  Job:  [id number]   cc:   Kirk Ruths, M.D.  Fax: [phone]

## 2010-05-22 NOTE — Letter (Signed)
December 24, 2005    Kirk Ruths, M.D.  [address]  [place], [place] [address]   RE:  NATAN, HARTOG  MRN:  [id number]  /  DOB:  1945/09/15   Dear Annette Stable:   Mr. Rafferty return to the office for continued assessment and treatment  of edema.  Since starting Aldactone 50 mg b.i.d., he has diuresed  progressively.  He feels a great deal better.  He notes decreased edema  all the way up to his thighs.  He has been monitoring weight at home  with a 35 pound loss recorded.  He also has almost completely effected  healing of the ulcer on his left shin.   Medications are unchanged from his visit, except for Aldactone 50 mg  b.i.d.   PHYSICAL EXAMINATION:  GENERAL:  A pleasant gentleman in no acute  distress.  VITAL SIGNS:  Weight is 286, 31 pounds less than three weeks ago.  Blood  pressure 140/75, heart rate 72 and somewhat irregular.  Respirations 20.  NECK:  Minimal jugular venous distention.  LUNGS:  Clear.  CARDIAC:  Split first heart sounds; normal second heart sound.  ABDOMEN:  Soft and nontender.  EXTREMITIES:  Edema 1+.  Complete epithelialization of the left leg  ulcer.   Mr. Scow is doing beautifully.  Chemistry profile was checked a week  ago and was stable.  We will obtain another today and a third in three  weeks.  Mr. Hoeg has appointments coming up  with Dr. Jena Gauss, Dr. Leslie Dales, and with the transplant service at [facility].  Accordingly, I will not see him for two months.  Vaccinations are  up to date.    Sincerely,      Gerrit Friends. Dietrich Pates, MD, [facility]  Electronically Signed    RMR/MedQ  DD: 12/24/2005  DT: 12/25/2005  Job #: [id number]   CC:   R. Roetta Sessions, M.D.  Veverly Fells. Altheimer, M.D.

## 2010-05-22 NOTE — H&P (Signed)
NAME:  [name], [name]                          ACCOUNT NO.:  [id number]   MEDICAL RECORD NO.:  [id number]                   PATIENT TYPE:  INP   LOCATION:  IC03                                 FACILITY:  [facility]   PHYSICIAN:  Madelin Rear. Sherwood Gambler, M.D.             DATE OF BIRTH:  06-17-1945   DATE OF ADMISSION:  08/06/2002  DATE OF DISCHARGE:                                HISTORY & PHYSICAL   CHIEF COMPLAINT:  Fatigue and lightheadedness.   HISTORY OF PRESENT ILLNESS:  The patient presented to Dr. Marvel Plan office  complaining of several months of progressively increasing fatigue and  lightheadedness.  Over the past five days he had noted melena with no  hematochezia or hematemesis.  He has had some right lower quadrant abdominal  pain and previous hematuria workup by Dr. Rito Ehrlich and thought to be  secondary to nephrolithiasis and ureterolithiasis with passage.  The pain  has persisted a little bit in his right mesogastric and right lower quadrant  area but nothing acute.  He denied any fever, rigors, or chills; no  vomiting.   PAST MEDICAL HISTORY:  1. Hypertension.  2. Chronic atrial fibrillation.  3. 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  4. Diabetes mellitus.  5. Hyperlipidemia.   SOCIAL HISTORY:  He does not smoke, drink, or use alcohol.   FAMILY HISTORY:  Positive for diabetes mellitus; otherwise, noncontributory.   REVIEW OF SYSTEMS:  As under HPI; all else negative.   PHYSICAL EXAMINATION:  SKIN:  Shows pallor.  HEENT:  Shows conjunctival pallor.  No JVD or adenopathy.  NECK:  Supple.  CHEST:  Clear.  CARDIAC:  Irregularly irregular rhythm with a well-controlled ventricular  response rate; no gallop or rub.  ABDOMEN:  Soft, no organomegaly or masses, no guarding or rebound  tenderness.  NEUROLOGIC:  Nonfocal.  EXTREMITIES:  Positive 2-3+ bipedal edema with stasis dermatitis changes.   IMPRESSION:  1. Acute upper gastrointestinal bleed suggested by  melena.  His admission     laboratories reveal 7.2 g % hemoglobin with normal platelet count and     white count.  Coagulation parameters also reveal excessive     anticoagulation with an INR of 3.8.  Coumadin will be held, serial H&H's     performed, transfusion x2 units now, monitor hematocrit.  GI consultation     is anticipated for possible need for upper endoscopy.  H. pylori will     also be checked.  2.     Chronic atrial fibrillation.  At present, ventricular response is controlled     well.  Monitor, intervene as necessary.  3. Hypertension, well controlled at present.  No change in regimen.  4. Diabetes mellitus.  Sliding scale and monitoring of his blood sugar.  Madelin Rear. Sherwood Gambler, M.D.    LJF/MEDQ  D:  08/06/2002  T:  08/06/2002  Job:  [id number]

## 2010-05-22 NOTE — Consult Note (Signed)
NAME:  [name], [name]                          ACCOUNT NO.:  [id number]   MEDICAL RECORD NO.:  [id number]                   PATIENT TYPE:  INP   LOCATION:  IC03                                 FACILITY:  [facility]   PHYSICIAN:  R. Roetta Sessions, M.D.              DATE OF BIRTH:  Feb 15, 1945   DATE OF CONSULTATION:  08/06/2002  DATE OF DISCHARGE:                                   CONSULTATION   REFERRING PHYSICIAN:  Madelin Rear. Sherwood Gambler, M.D.   REASON FOR CONSULTATION:  Gastrointestinal bleed.   HISTORY OF PRESENT ILLNESS:  The patient is a 65 year old Caucasian male  with history of atrial fibrillation on Coumadin, small bowel Crohn's  disease, diabetes mellitus, history of duodenal ulcer (15 years ago) who is  admitted with a five-day history of melena, anemia.  He has had some  lightheadedness and fatigue as well.  He has a one-month history of  excessive belching but otherwise denies any abdominal pain, nausea or  vomiting, heartburn.  He has 8-10 bowel movements daily chronically.  Denies  any rectal bleeding.  The patient reports his hemoglobin was around 9 a  couple of months ago.  He stated as an outpatient hemoglobin was 7.2,  hematocrit 22.6, PT 27.2, INR 3.8.  Admission labs revealed a drop in  hemoglobin to 6.6, hematocrit 20.6.  He is also hemoccult positive x1.   MEDICATIONS PRIOR TO ADMISSION:  Obtained from our office list.  The patient  states medicines have not changed.  1. Coumadin 5 mg daily.  2. Pentasa 250 mg, four tablets four times a day.  3. Avandia 4 mg daily.  4. Lopressor 50 mg b.i.d.  5. Glucophage 500 mg b.i.d.  6. Coumadin 5 mg daily.  7. Cardizem 180 mg daily.  8. Dicyclomine 10 mg p.r.n.  9. Lopid 600 mg b.i.d.  10.      Zocor one daily.  11.      Amaryl 5 mg daily.   ALLERGIES:  TYLENOL NO. 3 AND CODEINE.   PAST MEDICAL HISTORY:  1. Hypertension.  2. Atrial fibrillation on Coumadin.  3. Diabetes mellitus.  4. Hyperlipidemia.  5. History of  hematuria with kidney stones, followed by Dr. Rito Ehrlich.  6. Small bowel Crohn's disease diagnosed in 1997 at the time of colonoscopy.     He had ulcers in the cecum as well as ulcers at 10 cm of the terminal     ileum.  7. History of arthritis.  8. Benign prostatic hypertrophy.  9. Irritable bowel syndrome.  10.      Duodenal ulcer 15 years ago as outlined above.  11.      Status post cholecystectomy.  12.      Kidney stone extraction.   FAMILY HISTORY:  Significant for diabetes mellitus.  Mother died of renal  carcinoma.  No family history of chronic GI illnesses or colorectal cancer.   SOCIAL  HISTORY:  Divorced.  Denies any alcohol or tobacco use.   REVIEW OF SYSTEMS:  Please see HPI for GI.  GENERAL:  Denies any weight  loss.  CARDIOPULMONARY:  Denies any chest pain or shortness of breath.   PHYSICAL EXAMINATION:  VITAL SIGNS:  Height 6 feet 1 inch, weight 268.  Temperature 98.7, pulse 74, respirations 16, blood pressure 126/68.  GENERAL:  Pleasant, well-nourished, well-developed Caucasian male in no  acute distress.  SKIN:  Warm and dry.  No jaundice.  HEENT:  Conjunctivae pale.  Sclerae nonicteric.  Oropharynx mucosa moist and  pink.  No lesions, erythema or exudate.  NECK:  No lymphadenopathy or thyromegaly.  CHEST:  Lungs clear to auscultation.  CARDIAC:  Regular rate and rhythm.  Normal S1 and S2 with no murmurs, rubs,  or gallops.  ABDOMEN:  Positive bowel sounds.  Obese but symmetrical.  Soft.  Mild right  lower quadrant tenderness to deep palpation.  No hepatosplenomegaly or  masses.  EXTREMITIES:  1+ pedal edema bilaterally.   LABORATORY DATA:  As mentioned in the HPI.  In addition, sodium 134,  potassium 4.1, BUN 17, creatinine 1.1.  MCV 71.3.  WBC 5.6, platelets  153,000, glucose 258.   IMPRESSION:  1. The patient is a 65 year old Caucasian gentleman who gives a five-day     history of melena and has profound anemia.  Suspect upper     gastrointestinal bleed,  possibly secondary to arteriovenous malformation,     peptic ulcer disease, erosions.  2. Coagulopathy secondary to Coumadin therapy.  3. Crohn's disease.  He has 8-10 stools daily.  It is questionable whether     this is his baseline or if he continues to have a Crohn's flare.   SUGGESTIONS:  1. Intravenous Protonix 40 mg q.24h.  2. Transfuse as needed.  3.     Serial hemoglobin and hematocrit as ordered.  4. EGD in the morning.   I would like to thank Dr. Sherwood Gambler for allowing [place] to participate in the care  of this patient.     Tana Coast, Pricilla Larsson, M.D.    LL/MEDQ  D:  08/06/2002  T:  08/06/2002  Job:  [id number]   cc:   Madelin Rear. Sherwood Gambler, M.D.  [address]  [place]  [place] [address]  Fax: [phone]

## 2010-05-22 NOTE — Discharge Summary (Signed)
   NAME:  [name], [name]                          ACCOUNT NO.:  [id number]   MEDICAL RECORD NO.:  [id number]                   PATIENT TYPE:  INP   LOCATION:  A220                                 FACILITY:  [facility]   PHYSICIAN:  Madelin Rear. Sherwood Gambler, M.D.             DATE OF BIRTH:  1945-02-13   DATE OF ADMISSION:  08/06/2002  DATE OF DISCHARGE:  08/13/2002                                 DISCHARGE SUMMARY   DISCHARGE DIAGNOSES:  1. Regional enteritis (Crohn's disease).  2. Chronic atrial fibrillation.  3. Long-term use of anticoagulants.  4. Iron deficiency anemia.  5. Non-insulin dependent diabetes mellitus.  6. Esophageal varices.  7. Hypertension.   HOSPITAL COURSE:  The patient was admitted with recurrent hematochezia.  He  never became hemodynamically unstable, but he required colonoscopy after  three to four episodes of same.  Endoscopy with gastroenterology revealed  regional enteritis as the source of his bleeding and incidental esophageal  varices were noticed as well.  He remained stable in house.  He was  discharged.   DISCHARGE MEDICATIONS:  1. Continued Lantus 10 units subcu daily.  2. Medrol dose pack.  3. Withholding of his Coumadin secondary to gastrointestinal bleeding.  4. Iron sulfate p.o. q.d.   FOLLOW UP:  Follow up in our office.                                               Madelin Rear. Sherwood Gambler, M.D.    LJF/MEDQ  D:  08/27/2002  T:  08/27/2002  Job:  [id number]

## 2010-05-22 NOTE — Op Note (Signed)
[name], [name]                ACCOUNT NO.:  [id number]   MEDICAL RECORD NO.:  [id number]          PATIENT TYPE:  AMB   LOCATION:  DAY                           FACILITY:  [facility]   PHYSICIAN:  R. Roetta Sessions, M.D. DATE OF BIRTH:  07/19/1945   DATE OF PROCEDURE:  02/11/2004  DATE OF DISCHARGE:                                 OPERATIVE REPORT   PROCEDURE:  Esophagogastroduodenoscopy with esophageal band ligation of  esophageal varices.   INDICATION FOR PROCEDURE:  The patient is a 65 year old gentleman with  cirrhosis secondary to fatty liver, Crohn's disease, atrial fibrillation,  and has presented with recurrent GI bleeding recently.  On January 26, 2004,  he underwent esophageal band ligation by Dr. Warnell Forester.  He was felt to  have bleeding stigmata on the varices.  He has done well, and the question  now arises about whether or not he should go back on Coumadin.  He comes for  follow-up EGD to perform additional banding as appropriate.  This approach  has been discussed with the patient at length, potential risks, benefits,  and alternatives have been reviewed.  His hemoglobin today is 9.5.   This approach has been discussed with the patient, and the potential risks,  benefits, and alternatives have been reviewed, questions answered.  Please  see documentation in the medical record.   PROCEDURE NOTE:  The patient was placed in the left lateral decubitus  position.  O2 saturation, blood pressure, pulse and respirations were  monitored throughout the entire procedure.   CONSCIOUS SEDATION:  Versed 5 mg IV, Demerol 100 mg IV in divided doses.   INSTRUMENT USED:  Olympus video colonoscope.   FINDINGS:  Examination of the tubular esophagus revealed three columns of  grade 2-3 esophageal varices.  There were two ulcers on the distal varices,  indicative of location of prior band placement.  The EG junction was easily  traversed.   Stomach:  The gastric cavity was empty and  insufflated well with air.  A  thorough examination of the gastric mucosa, including retroflexed view of  the proximal stomach and esophagogastric junction, demonstrated snake-  skinning or a reticulated appearance of the gastric mucosa consistent with  portal gastropathy.  It is notable that the stomach easily bled with very  minor trauma (manipulation with the scope).  The mucosa otherwise appeared  normal.  The pylorus was patent and easily traversed.  Examination of the  bulb and second portion revealed no abnormalities.   Therapeutic/diagnostic maneuvers performed:  I removed the scope and placed  a seven-shot Microvasive bander and placed it back into the esophagus;  however, I pulled up two varices into the suction channel and attempted to  deploy the bands.  It would not deploy through the scope, and we got a new  unit and ultimately went back and placed three bands on three varices  without difficulties.  No hemorrhage was precipitated.  The patient  tolerated the procedure well and was reacted in endoscopy.   IMPRESSION:  1.  Residual esophageal varices as described above, status post esophageal  band ligation as described above.  2.  Snake-skinning and friability of the gastric mucosa consistent with      portal gastropathy, otherwise normal D1, D2.   RECOMMENDATIONS:  I feel that Coumadin is contraindicated in this patient.  He is likely to bleed from other lesions in his GI tract, which we cannot  really improve upon at this time (i.e., portal gastropathy and Crohn's  ileitis).  I feel it would be safest for him to stay off the Coumadin from  the standpoint of GI bleeding.  Will plan to see this nice gentleman back in  the office in two weeks for a recheck.      RMR/MEDQ  D:  02/11/2004  T:  02/11/2004  Job:  [id number]   cc:   Vida Roller, M.D.  Fax: [phone]

## 2010-05-22 NOTE — Discharge Summary (Signed)
[name], [name]                ACCOUNT NO.:  [id number]   MEDICAL RECORD NO.:  [id number]          PATIENT TYPE:  INP   LOCATION:  A217                          FACILITY:  [facility]   PHYSICIAN:  Madelin Rear. Sherwood Gambler, MD  DATE OF BIRTH:  07-Oct-1945   DATE OF ADMISSION:  01/25/2004  DATE OF DISCHARGE:  01/25/2006LH                                 DISCHARGE SUMMARY   DISCHARGE DIAGNOSES:  1.  Liver cirrhosis, non alcoholic.  2.  Steatohepatitis.  3.  Esophageal varices.  4.  Upper gastrointestinal bleed.  5.  Atrial fibrillation.  6.  Hyperlipidemia.  7.  Diabetes mellitus type 2.  8.  Regional enteritis.  9.  Iron-deficiency anemia.   DISCHARGE MEDICATIONS:  Continue all home medications per routine except  discontinuation of Coumadin.   SUMMARY:  The patient presented with classic upper GI symptomatology and  severe anemia requiring multiple transfusions. A rather brisk bleed. His  hematocrit stabilized. He was seen in consultation by gastroenterology with  banding of varices performed with successful cessation of bleeding.  He was  observed over several days, clinically stable. Discharged on discontinuation  of Coumadin after due consideration of the risk, benefit.      LJF/MEDQ  D:  02/11/2004  T:  02/11/2004  Job:  [id number]

## 2010-05-22 NOTE — H&P (Signed)
NAME:  [name], [name]                          ACCOUNT NO.:  [id number]   MEDICAL RECORD NO.:  [id number]                   PATIENT TYPE:  INP   LOCATION:  A216                                 FACILITY:  [facility]   PHYSICIAN:  Patrica Duel, M.D.                 DATE OF BIRTH:  [birth year]   DATE OF ADMISSION:  09/30/2002  DATE OF DISCHARGE:                                HISTORY & PHYSICAL   CHIEF COMPLAINT:  Nausea, vomiting, diarrhea, and bright red blood per  rectum.   HISTORY OF PRESENT ILLNESS:  This is a 65 year old male with the history of  diabetes mellitus type 2, possible cirrhosis of the liver of questionable  etiology, hypertension, atrial fibrillation, chronic coumadinization, and  Crohn's disease.  He is also status post cholecystectomy.   The patient was admitted approximately six weeks ago with rectal bleeding.  The patient underwent panendoscopy.  Dr. Jena Gauss and Dr. Karilyn Cota are managing  his Crohn's   The patient had done well since discharge until approximately two days prior  to presenting to the emergency department complaining of acute nausea,  vomiting, and watery diarrhea.  The day of admission he developed bright red  blood per rectum of questionable volume.  In the emergency department he was  found to be febrile with temperature 100.7; otherwise, hemodynamics were  stable.  Laboratory review revealed an H&H of 9.3/29.2, heme positive  stools, a potassium of 3.0, INR of 3.1, a sed rate of 41.  On rectal exam he  was noted to be very tender, with perirectal irritation and hemorrhoids  present.   The patient is admitted for the above complaints and for further evaluation  and therapy as indicated.   There is no history of headache, neurologic deficits, chest pain, shortness  of breath, significant abdominal pain other than cramping, or genitourinary  symptoms.   CURRENT MEDICATIONS:  1. Cardizem CD 120 daily.  2. Lopressor 50 mg b.i.d.  3. Lanoxin 250 mcg  daily  4. Lopid 600 mg b.i.d.  5. Lasix 40 mg b.i.d.  6. Glucophage two b.i.d.  7. Amaryl 4 mg b.i.d.  8. Bentyl 10 mg t.i.d.  9. Lomotil CD two p.r.n.  10.      Coumadin 5 mg daily.  11.      Avandia 4 mg daily.  12.      Lantus insulin 10 units daily.  13.      Pentasa 250 mg sixteen tablets daily.   ALLERGIES:  None known.   PAST MEDICAL HISTORY:  As noted above.   SOCIAL HISTORY:  Nonsmoker and nondrinker.   REVIEW OF SYSTEMS:  Negative except as mentioned.   FAMILY HISTORY:  Noncontributory.   PHYSICAL EXAMINATION:  GENERAL:  This is a very pleasant white male.  He has  mild pallor.  Currently, vital signs are stable.  VITAL SIGNS:  Blood pressure 134/71, heart rate  is 90, temperature 99.9.  MENTAL STATUS:  He is alert and oriented.  HEENT:  Normocephalic, atraumatic.  Pupils are equal.  There is mild scleral  pallor.  No icterus noted.  Mucous membranes are moist.  NECK:  Supple.  No thyromegaly, lymphadenopathy, or bruits noted.  LUNGS:  Clear to A&P.  HEART:  Rhythm is currently regular.  There are no murmurs, rubs, or  gallops.  ABDOMEN:  Reveals mild tenderness in the epigastrium only.  Bowel sounds are  normal.  There is mild protuberance present but no hypertympany.  No  organomegaly noted.  EXTREMITIES:  Reveal no clubbing, cyanosis, or edema.  RECTAL:  Per ER doctor reveals perianal irritation and hemorrhoids.   PERTINENT LABORATORY DATA:  As noted above.  Glucose 143 on admission.   ASSESSMENT:  Nausea, vomiting, diarrhea, with bright red blood per rectum in  a 65 year old male with the medical history noted above.  This could  possibly represent acute gastroenteritis or flare of Crohn's disease.  Significant anemia is present.  Cardiac status stable.  Hypertension and  diabetes controlled.  Also, hypokalemia noted which is mild.   PLAN:  IV hydration with potassium supplementation.  Will consult GI.  Type,  cross, hold  2 units.  Empiric PPI therapy.   Will follow and treat  expectantly.  Currently will hold Coumadin pending resolution of his  bleeding.     ___________________________________________                                         Patrica Duel, M.D.   MC/MEDQ  D:  10/01/2002  T:  10/01/2002  Job:  [id number]

## 2010-05-22 NOTE — H&P (Signed)
[name], [name]                ACCOUNT NO.:  [id number]   MEDICAL RECORD NO.:  [id number]          PATIENT TYPE:  INP   LOCATION:  A201                          FACILITY:  [facility]   PHYSICIAN:  Lionel December, M.D.    DATE OF BIRTH:  [birth year]   DATE OF ADMISSION:  02/12/2005  DATE OF DISCHARGE:  LH                                HISTORY & PHYSICAL   Reason for admission.  Melena and anemia.   HISTORY OF PRESENT ILLNESS:  [name] is a 65 year old Caucasian male who  is a patient of Dr. Luvenia Starch with a history of decompensated NASH cirrhosis  with esophageal varices. He also has a history of atrial fibrillation,  hypertension, hypercholesterolemia, diabetes mellitus, chronic anemia for  which he has received parenteral iron in the past as well as small bowel  Crohn's disease.  He presented to the office, today, with a large melanotic  stool this morning.  Yesterday he did have a few dark stools.  He is  complaining of fatigue.  We sent him to the hospital for a STAT CBC which  revealed a hemoglobin of 7.8.  He was, therefore, admitted for suspected GI  bleeding.  He is also complaining of mild epigastric to mid abdominal pain  which he rates 2-3/10 on a pain scale.  He describes the pain as gnawing.  He denies any nausea or vomiting.  He denies any chest pain, shortness of  breath, cough, or dyspnea.  He generally has between 3-4 loose to semi-  formed bowel movements daily. He denies any heart burn, indigestion,  dysphagia or odynophagia.   The last EGD was February 11, 2004 by Dr. Jena Gauss. He had snake skinning,  varices, as well as portal gastropathy.  Dr. Jena Gauss placed three bands on  three varices without difficulty, at that time.  Prior to this he had had an  EGD on January 26, 2004 by Dr. Warnell Forester.  There were a total of 3 bands  placed on varices at that time.  He had been on Coumadin which has been  discontinued since that time.   Upon admission his hemoglobin  registers 8.1, hematocrit 23.7, and platelets  122.  He has a white blood cell count of 5.2, and an MCV of 94.8.  He has  been admitted for blood transfusion and further evaluation of his source of  GI bleeding.   CURRENT MEDICATIONS:  1.  Lanoxin 250 mcg daily.  2.  Propranolol 20 mg b.i.d.  3.  Lasix 40 mg b.i.d.  4.  Zocor 40 mg daily.  5.  Tricor 145 mg q.h.s.  6.  Glucophage 500 mg 2 p.o. b.i.d.  7.  Actos 45 mg daily.  8.  Lantus 30 units at bedtime.  9.  __________ sliding scale.  10. Pentasa 250 mg 4 p.o. q.i.d.  11. Mylanta p.r.n.  12. Cymbalta 60 mg daily.  13. Diovan 80 mg daily.  14. Vitamin B12 weekly.   PAST MEDICAL HISTORY:  History of decompensated NASH cirrhosis with history  of esophageal varices.  [name] has been followed  by [facility]; and  at one time was on the transplant list. He does have morbid obesity and he  also had a Mill's score of 12 due to only trace ascites, therefore, he was  deactivated as he is to attempt weight loss.  He also has history of atrial  fibrillation, hypertension, hypercholesterolemia, diabetes mellitus, small  bowel Crohn's disease well controlled on mesalamine.  Chronic anemia, he has  received parenteral iron.  He had a cholecystectomy in 1992.   ALLERGIES:  TYLENOL NO. 3.   SOCIAL HISTORY:  [name] is divorced. He currently resides with his  sister.  He is unemployed.  He lives in [place].  He denies any tobacco,  alcohol, or drug use.   FAMILY HISTORY:  No known family history of colorectal carcinoma, liver, or  chronic GI problems.   REVIEW OF SYSTEMS:  CONSTITUTIONAL:  Weight is stable, denies any fever or  chills.  Denies any anorexia or early satiety. CARDIOVASCULAR:  Denies any  history of __________ dyspnea, cough or hemoptysis.  GASTROINTESTINAL:  See  HPI.   PHYSICAL EXAMINATION:  VITAL SIGNS:  Weight 290.6 pounds.  Height 73 inches.  Temperature 97.6, pulse 171, respirations 18, blood pressure  104/54.  GENERAL:  [name] is a 65 year old morbidly obese, Caucasian male who is  alert, pale, oriented, and cooperative in no acute distress.  HEENT:  Sclerae are clear.  Nonicteric.  Conjunctivae is pale. Oropharynx  was pink and moist without any lesions.  NECK:  Supple without any masses or thyromegaly.  HEART:  Regular rate and rhythm with normal S1-S2 without murmurs, rubs,  thrills or gallops.  LUNGS:  Clear to auscultation bilaterally.  ABDOMEN:  Protuberant with positive bowel sounds x4.  No bruits auscultated.  Soft, nontender, slightly distended.  No palpable tense ascites.  Unable to  palpate hepatosplenomegaly or mass given the patient's body habitus.  No  rebound tenderness or guarding.  RECTAL:  No external lesions.  Good sphincter tone.  No internal mass  palpated.  Small medium brown stool is obtained from the vault and sent to  the lab for Hemoccult  EXTREMITIES:  Trace lower extremity edema bilaterally.   LABORATORY STUDIES:  See HPI.   IMPRESSION:  [name] is a 65 year old Caucasian male who presents with  melena and significant anemia.  I suspect GI bleed possibly due to  esophageal varices. His last GI bleed secondary to this was one year ago.  He has decompensated NASH cirrhosis. He also has a history of small bowel  Crohn's disease which has been well-controlled on mesalamine.  He has a  history of chronic anemia and has received parenteral iron infusions in the  past.  [name] is diabetic as well. He also has history of hypertension,  atrial fibrillation, hypercholesterolemia.   PLAN:  [name] is admitted.  We will type-and-cross for 4 units and  transfuse 2 units of packed red blood cells now.  He is going to be on a  clear-liquid diet, NPO after midnight for EGD by Dr. Karilyn Cota in the morning.  The risks and benefits including bleeding, infection, perforation, and drug reaction have been reviewed with him.  Consent will obtained.  He will   receive a CBC posttransfusion.  Other labs to include PT, INR, routine vital  signs, will Hemoccult stool obtained from exam.   MEDICATIONS:  To include insulin per insulin-sensitive protocol, Lantus 15  units subcu in the p.m. Fingersticks a.c. and h.s. Propranolol  20 mg b.i.d.,  Lanoxin 250 mg mcg p.o. daily, Diovan 80 mg daily, Cymbalta 60 mg daily,  telemetry monitoring.  Further recommendations pending  esophagogastroduodenoscopy.      Nicholas Lose, N.P.      Lionel December, M.D.  Electronically Signed    KC/MEDQ  D:  02/12/2005  T:  02/12/2005  Job:  [id number]   cc:   Kirk Ruths, M.D.  Fax: [phone]

## 2010-05-22 NOTE — Procedures (Signed)
[name], [name]                ACCOUNT NO.:  [id number]   MEDICAL RECORD NO.:  [id number]          PATIENT TYPE:  INP   LOCATION:  A313                          FACILITY:  [facility]   PHYSICIAN:  Edward L. Juanetta Gosling, M.D.DATE OF BIRTH:  September 26, 1945   DATE OF PROCEDURE:  08/10/2005  DATE OF DISCHARGE:                                EKG INTERPRETATION   The rhythm is atrial fibrillation.  The ventricular response is in the 80s.  There are areas where it appears to be a bigeminal rhythm.  There are  multiple PVCs.  There is an incomplete right bundle branch block.  Diffuse  ST-T wave abnormalities are seen which are nonspecific.  Abnormal  electrocardiogram.      Oneal Deputy. Juanetta Gosling, M.D.  Electronically Signed     ELH/MEDQ  D:  08/11/2005  T:  08/11/2005  Job:  [id number]

## 2010-05-22 NOTE — Discharge Summary (Signed)
[name], [name]                ACCOUNT NO.:  [id number]   MEDICAL RECORD NO.:  [id number]            PATIENT TYPE:   LOCATION:                                 FACILITY:   PHYSICIAN:  Madaline Savage, MD             DATE OF BIRTH:   DATE OF ADMISSION:  02/03/2006  DATE OF DISCHARGE:  02/17/2006                               DISCHARGE SUMMARY   ADDENDUM   PRIMARY CARE PHYSICIAN:  Kirk Ruths, M.D.   This is an addendum to the discharge summary dictated by Dr. Hettie Holstein, on the 12th of February 2008.  For a complete list of discharge  diagnoses and discharge medications, see the Dr. Samantha Crimes discharge  summary.   DISPOSITION:  He was waiting for a skilled nursing home bed for the last  couple of days.  He needs a nursing home placement, primarily for his  bed rest.  He has been advised that he needs three weeks of bed rest for  him to get healthy before they can do his spinal surgery, but at this  point in time, he changed his mind and he wants to go home.  He wants to  take his rest at home and he is now being discharged home.  We are  arranging for a hospital bed at home.  He will also be arranged to have  an ambulance transfer him home.  He has been advised that he needs  strict bedrest for three weeks.   FOLLOWUP:  He will need to follow up with his primary care doctor, Dr.  Regino Schultze, in two to three weeks.  He is also advised to follow up with  Dr. Danielle Dess, the neurosurgeon, in three weeks.      Madaline Savage, MD  Electronically Signed     PKN/MEDQ  D:  02/17/2006  T:  02/17/2006  Job:  [id number]

## 2010-05-22 NOTE — Letter (Signed)
July 14, 2005     Dr. Iantha Fallen of [place] at [facility]  [address] [facility]  [id number]  [place], [place]  [id number]   RE:  [name], [name]  MRN #[id number]  /  DOB:  1945/10/23   Dear Dr. Jacqualine Mau:   It was my pleasure reassessing [name] in the office today at your  request.  As you know, he has had longstanding atrial fibrillation.  I first  saw him for paroxysmal AF in February of 1995.  By mid 1997, constant atrial  fibrillation was noted.  He was initially maintained on anticoagulants, but  suffered variceal bleeding in 2006, resulting in their discontinuation.  His  risk for thromboembolism is moderate by virtue of a history of hypertension  in the presence of diabetes, but he has done well without any clinical  events over the past 18 months.   The etiology for atrial fibrillation is presumably hypertensive heart  disease.  He has undergone a complete evaluation at [facility] with  echocardiography, a stress nuclear study, and coronary angiography, all  revealing no significant structural heart disease.  He was found to have  pulmonary hypertension.  I do not have records concerning the final  impression after that evaluation, but it is likely that his pulmonary  hypertension is related to his liver disease.   Control of heart rate has been adequate, as has control of blood pressure.  He also has been treated with a statin in light of diabetes, but does not  have known vascular disease and in fact had normal coronary angiography in  2006.   As you know, [name] suffered a repeat upper GI bleed this year,  prompting banding of his esophageal varices.  He was transfused 2 units of  packed red blood cells.  I have no recent laboratory results available to  me.   PHYSICAL EXAMINATION:  VITAL SIGNS:  Weight 289, stable since October of  last year.  Blood pressure 120/70, heart rate 70 and irregular, respirations  16.  GENERAL:  A pleasant gentleman in no acute distress.  NECK:  No jugular venous distention; no carotid bruits.  LUNGS:  Clear.  CARDIAC:  Normal first and second heart sounds; normal PMI; grade 2/6  basilar systolic ejection murmur.  ABDOMEN:  Soft and nontender; normal bowel sounds.  EXTREMITIES:  Chronic stasis changes; 1+ pitting pretibial edema.   A 6 minute walk test was performed.  Patient's performance was somewhat  impaired by the fact that he was sedated earlier in the day for upper  endoscopy and repeat esophageal variceal banding.  He did cover 900 feet  without significant dyspnea.  Heart rate increased from 70 beats per minute  to 90 beats per minute.   IMPRESSION:  [name] has longstanding atrial fibrillation for which he  cannot safely be anticoagulated.  He was taking aspirin in the past, but  this has been discontinued as well since his most recent episode of variceal  bleeding.  I see no problem with his remaining off all anticoagulants.  He  does have a moderate risk of thromboembolism, but the risk-benefit ratio of  treatment with warfarin would be unfavorable.   He does not apparently have symptoms related to atrial fibrillation.  Control of heart rate is good with his current medication.  As you pointed  out, there is no reason that his atrial fibrillation should exclude him from  hepatic transplantation  although it is somewhat of a negative prognostic  factor in terms of possible thromboembolism and mortality related to a  cardiovascular event.  Of course, his normal catheterization, stress nuclear  study, and echocardiogram are reassuring in terms of his suitability for  transplantation from a cardiac standpoint.   Please feel free to call me if any additional information is required.    Sincerely,      Gerrit Friends. Dietrich Pates, MD, [facility]   RMR/MedQ  DD:  07/14/2005  DT:  07/14/2005  Job #:  [id number]   CC:  Kirk Ruths, MD  R. Roetta Sessions, MD  Ladona Horns. Mariel Sleet, MD

## 2010-05-25 ENCOUNTER — Encounter: Payer: Self-pay | Admitting: Cardiology

## 2010-05-25 ENCOUNTER — Ambulatory Visit (INDEPENDENT_AMBULATORY_CARE_PROVIDER_SITE_OTHER): Payer: Medicare Other | Admitting: Cardiology

## 2010-05-25 DIAGNOSIS — J90 Pleural effusion, not elsewhere classified: Secondary | ICD-10-CM

## 2010-05-25 DIAGNOSIS — I10 Essential (primary) hypertension: Secondary | ICD-10-CM | POA: Insufficient documentation

## 2010-05-25 DIAGNOSIS — K922 Gastrointestinal hemorrhage, unspecified: Secondary | ICD-10-CM | POA: Insufficient documentation

## 2010-05-25 DIAGNOSIS — I8500 Esophageal varices without bleeding: Secondary | ICD-10-CM | POA: Insufficient documentation

## 2010-05-25 DIAGNOSIS — E785 Hyperlipidemia, unspecified: Secondary | ICD-10-CM

## 2010-05-25 DIAGNOSIS — E119 Type 2 diabetes mellitus without complications: Secondary | ICD-10-CM

## 2010-05-25 DIAGNOSIS — Z95828 Presence of other vascular implants and grafts: Secondary | ICD-10-CM | POA: Insufficient documentation

## 2010-05-25 DIAGNOSIS — D649 Anemia, unspecified: Secondary | ICD-10-CM

## 2010-05-25 DIAGNOSIS — K5090 Crohn's disease, unspecified, without complications: Secondary | ICD-10-CM

## 2010-05-25 DIAGNOSIS — K7460 Unspecified cirrhosis of liver: Secondary | ICD-10-CM

## 2010-05-25 DIAGNOSIS — N200 Calculus of kidney: Secondary | ICD-10-CM | POA: Insufficient documentation

## 2010-05-25 DIAGNOSIS — K7581 Nonalcoholic steatohepatitis (NASH): Secondary | ICD-10-CM

## 2010-05-25 DIAGNOSIS — I2789 Other specified pulmonary heart diseases: Secondary | ICD-10-CM

## 2010-05-25 DIAGNOSIS — I272 Pulmonary hypertension, unspecified: Secondary | ICD-10-CM

## 2010-05-25 DIAGNOSIS — I4891 Unspecified atrial fibrillation: Secondary | ICD-10-CM

## 2010-05-25 DIAGNOSIS — N189 Chronic kidney disease, unspecified: Secondary | ICD-10-CM | POA: Insufficient documentation

## 2010-05-25 DIAGNOSIS — I482 Chronic atrial fibrillation, unspecified: Secondary | ICD-10-CM

## 2010-05-25 MED ORDER — DIGOXIN 125 MCG PO TABS: 125.0000 ug | ORAL_TABLET | Freq: Every day | ORAL | Status: DC

## 2010-05-25 NOTE — Progress Notes (Signed)
HPI : Mr. Chapple returns to the office as scheduled for continued assessment and treatment of hyperlipidemia, hypertension and long-standing atrial fibrillation.  Since his last visit, he has continued to have problems related to cirrhosis included a recent admission to [facility] for hepatic encephalopathy.  Unfortunately, he did not bring his medications with him, and the list he provides is incomplete.  With prompting, he recalls taking lactulose 30 cc q.i.d.  He has not recently suffered GI blood loss, but continues to have a chronic anemia.  Inflammatory bowel disease has been quiescent for some time, currently with no therapy directed at that problem.  He denies orthopnea, PND, syncope and chest pain.  Pedal edema has been relatively modest.  He has been told that liver transplantation is unlikely at this point due to his age.  He is not driving and requires a cane to help support him although he has no specific problems with his lower extremities or balance.  He has recently undergone cataract surgery, but has not yet obtained the glasses that were recommended postoperatively.  Current Outpatient Prescriptions on File Prior to Visit  Medication Sig Dispense Refill  . furosemide (LASIX) 40 MG tablet Take 20 mg by mouth 2 (two) times daily.       . insulin aspart (NOVOLOG) 100 UNIT/ML injection Inject into the skin as directed.        . insulin glargine (LANTUS) 100 UNIT/ML injection Inject 70 Units into the skin at bedtime.        . propranolol (INDERAL) 10 MG tablet Take 10 mg by mouth 2 (two) times daily.        . simvastatin (ZOCOR) 40 MG tablet Take 40 mg by mouth at bedtime.        Marland Kitchen DISCONTD: digoxin (LANOXIN) 0.25 MG tablet Take 250 mcg by mouth daily.           Allergies  Allergen Reactions  . Codeine   . Other Rash    Adhesive tape      Past medical history, social history, and family history reviewed and updated.  ROS: See history of present illness.  PHYSICAL EXAM: BP  106/58  Pulse 46  Wt 232 lb (105.235 kg) ; apical pulse-53; weight is decreased 17 pounds since his last visit. General-Well developed; no acute distress; depressed affect although patient denies any symptoms of same Body habitus-moderately overweight Neck-No JVD; faint right carotid bruit Lungs-clear lung fields; resonant to percussion; decreased breath sounds on the left Cardiovascular-normal PMI; normal S1 and S2; minimal systolic murmur Abdomen-normal bowel sounds; soft and non-tender without masses or organomegaly Musculoskeletal-No deformities, no cyanosis or clubbing Neurologic-Normal cranial nerves; symmetric strength and tone Skin-Warm, no significant lesions Extremities-distal pulses intact; trace edema  ASSESSMENT AND PLAN:

## 2010-05-25 NOTE — Assessment & Plan Note (Addendum)
Heart rate is fairly low, although not apparently causing symptoms.  Digoxin dosage will be decreased to 0.125 mg per day with the anticipation that this medication will likely be discontinued at the next visit.  There have been no embolic events since anticoagulation was discontinued 6 years ago as the result of acute and chronic GI blood loss.  His risk for upper and lower GI bleeding continues to be high; accordingly, I am not inclined to resume any anticoagulant or clopidogrel.

## 2010-05-25 NOTE — Assessment & Plan Note (Signed)
[name] does remarkably well in general with respect to his advanced hepatic disease.  Medications were reviewed in light of this problem, but no modification of his medical regime appears necessary.  I will see this very nice gentleman again in 7 months.

## 2010-05-25 NOTE — Patient Instructions (Signed)
Your physician recommends that you schedule a follow-up appointment in: 7 MONTHS Your physician has recommended you make the following change in your medication:DECREASE DIGOXIN TO 0.125MG  DAILY

## 2010-05-25 NOTE — Assessment & Plan Note (Signed)
Blood pressure control is good with current medications, which will be continued. 

## 2010-05-25 NOTE — Assessment & Plan Note (Signed)
We have no recent lipid profile, but coronary angiography was negative a few years ago.  I doubt that further cardiovascular treatment of risk factors will benefit [name], at least from a vascular standpoint.

## 2010-06-03 ENCOUNTER — Ambulatory Visit: Payer: Medicare Other | Admitting: Gastroenterology

## 2010-06-10 ENCOUNTER — Ambulatory Visit (HOSPITAL_COMMUNITY): Payer: Self-pay | Admitting: Oncology

## 2010-06-16 ENCOUNTER — Other Ambulatory Visit (HOSPITAL_COMMUNITY): Payer: Self-pay | Admitting: Oncology

## 2010-06-16 ENCOUNTER — Encounter (HOSPITAL_COMMUNITY): Payer: Medicare Other | Attending: Oncology | Admitting: Oncology

## 2010-06-16 DIAGNOSIS — D509 Iron deficiency anemia, unspecified: Secondary | ICD-10-CM | POA: Insufficient documentation

## 2010-06-16 DIAGNOSIS — D696 Thrombocytopenia, unspecified: Secondary | ICD-10-CM | POA: Insufficient documentation

## 2010-06-16 DIAGNOSIS — D649 Anemia, unspecified: Secondary | ICD-10-CM

## 2010-06-16 DIAGNOSIS — K7460 Unspecified cirrhosis of liver: Secondary | ICD-10-CM | POA: Insufficient documentation

## 2010-06-16 LAB — CBC
HCT: 32.8 % — ABNORMAL LOW (ref 39.0–52.0)
Hemoglobin: 11.4 g/dL — ABNORMAL LOW (ref 13.0–17.0)
RDW: 14.5 % (ref 11.5–15.5)
WBC: 7.8 10*3/uL (ref 4.0–10.5)

## 2010-06-16 LAB — DIFFERENTIAL
Basophils Absolute: 0.1 10*3/uL (ref 0.0–0.1)
Basophils Relative: 1 % (ref 0–1)
Eosinophils Relative: 2 % (ref 0–5)
Lymphocytes Relative: 9 % — ABNORMAL LOW (ref 12–46)
Neutro Abs: 6.1 10*3/uL (ref 1.7–7.7)

## 2010-06-16 LAB — RETICULOCYTES
RBC.: 3.64 MIL/uL — ABNORMAL LOW (ref 4.22–5.81)
Retic Count, Absolute: 72.8 10*3/uL (ref 19.0–186.0)
Retic Ct Pct: 2 % (ref 0.4–3.1)

## 2010-06-29 ENCOUNTER — Ambulatory Visit (INDEPENDENT_AMBULATORY_CARE_PROVIDER_SITE_OTHER): Payer: Medicare Other | Admitting: Gastroenterology

## 2010-06-29 ENCOUNTER — Encounter: Payer: Self-pay | Admitting: Gastroenterology

## 2010-06-29 VITALS — BP 105/57 | HR 69 | Temp 97.1°F | Ht 73.0 in | Wt 236.4 lb

## 2010-06-29 DIAGNOSIS — I8500 Esophageal varices without bleeding: Secondary | ICD-10-CM

## 2010-06-29 DIAGNOSIS — K7689 Other specified diseases of liver: Secondary | ICD-10-CM

## 2010-06-29 DIAGNOSIS — K7460 Unspecified cirrhosis of liver: Secondary | ICD-10-CM

## 2010-06-29 DIAGNOSIS — K5090 Crohn's disease, unspecified, without complications: Secondary | ICD-10-CM

## 2010-06-29 DIAGNOSIS — K7581 Nonalcoholic steatohepatitis (NASH): Secondary | ICD-10-CM

## 2010-06-29 DIAGNOSIS — R1013 Epigastric pain: Secondary | ICD-10-CM | POA: Insufficient documentation

## 2010-06-29 MED ORDER — PANTOPRAZOLE SODIUM 40 MG PO TBEC: 40.0000 mg | DELAYED_RELEASE_TABLET | Freq: Two times a day (BID) | ORAL | Status: DC

## 2010-06-29 NOTE — Assessment & Plan Note (Signed)
Follow up at [facility] in 07/2010. Retrieve records from 05/2010.

## 2010-06-29 NOTE — Progress Notes (Signed)
Primary Care Physician:  Kirk Ruths, MD  Primary Gastroenterologist:  Roetta Sessions, MD  Chief Complaint  Patient presents with  . EGD    repeat    HPI:  [name] is a 65 y.o. male here for followup visit. He was on the recall calendar for surveillance EGD given his history of esophageal varices. The patient states he understood that he should have a six-month followup EGD and possibly would get to go longer next time if his varices have not come back since his TIPS placement. However according to Dr. Luvenia Starch recommendations at time of last EGD in November 2011, he should be able to wait 18 months this time since he did not have varices then.  He has had some mild epigastric pain associated with nausea. Worse with meals. He is doing better with taking Protonix on a daily basis but admits that he did not always do this. He has not noticed any improvement of his epigastric pain. Denies any heartburn. No vomiting. He has 3-5 bowel movements daily. Takes his lactulose regularly now. States his tone after his TIPS procedure but decided to stop taking it because of frequent bowel movements. Hospitalized at [facility] in May of this year with first episode of hepatic encephalopathy. States he had abdominal ultrasound done in May because MRI machine was broken down. Has followup at [facility] on 07/20/2010.   Since here last he had cataract extractions. He is pleased with his vision. No recent iron infusions, still follows with Dr. Mariel Sleet.  Current Outpatient Prescriptions  Medication Sig Dispense Refill  . digoxin (LANOXIN) 0.125 MG tablet Take 125 mcg by mouth daily. 1/2 a pill       . furosemide (LASIX) 40 MG tablet Take 40 mg by mouth daily.       Marland Kitchen gabapentin (NEURONTIN) 300 MG capsule       . insulin aspart (NOVOLOG) 100 UNIT/ML injection Inject into the skin as directed.       . insulin glargine (LANTUS) 100 UNIT/ML injection Inject 70 Units into the skin at bedtime.       Marland Kitchen lactulose  (CHRONULAC) 10 GM/15ML solution Take 20 g by mouth 4 (four) times daily.        . pantoprazole (PROTONIX) 40 MG tablet Take 1 tablet (40 mg total) by mouth 2 (two) times daily.  60 tablet  2  . propranolol (INDERAL) 10 MG tablet Take 10 mg by mouth 2 (two) times daily.       . simvastatin (ZOCOR) 40 MG tablet Take 40 mg by mouth at bedtime.        Marland Kitchen DISCONTD: digoxin (LANOXIN) 0.125 MG tablet Take 1 tablet (125 mcg total) by mouth daily.  30 tablet  6  . DISCONTD: diphenoxylate-atropine (LOMOTIL) 2.5-0.025 MG/5ML liquid Take 5 mLs by mouth 4 (four) times daily as needed.        Marland Kitchen DISCONTD: pantoprazole (PROTONIX) 40 MG tablet Take 40 mg by mouth daily.          Allergies as of 06/29/2010 - Review Complete 06/29/2010  Allergen Reaction Noted  . Codeine    . Other Rash 05/05/2010    Past Medical History  Diagnosis Date  . Hypertension     negative stress nuclear study in 02/2007; normal coronaries in 2006; Echo in 2006-aortic sclerosis, Mild biatrial enlargement; normal LV  . Pulmonary hypertension     moderate at cath-2006  . Hyperlipidemia   . Syncope 2008    orthostatic hypotension  .  Diabetes mellitus     insulin; onset in 1999; retinopathy, neuropathy and nephropathy  . Chronic atrial fibrillation     onset in 1995; anticoagulation discontinued in 2006  . Chronic kidney disease     stage III; creatinine of 1.6 in 8/07, 1.35 and 05/2008  . Crohn's disease     -lower GI bleed in 08/2002;  Pentasa stopped by Dr. Brooke Dare at [facility] ; clinical remission.  . Obesity   . NASH (nonalcoholic steatohepatitis)     cirrhosis with esophageal varices; h/o GI bleeding. EGD-2010 grade 2 esophageal varices-banded with good hemostasis; portal gastropathy, friable gastric mucosa; small hiatal hernia.  EGD during recurrent melena one week later showed 2 eschars at previously placed bands, decompressed esophageal varices grade 1-2,four columns, no bleeding stigmata; portal gastropathy, normal appearing  small bowel.    . Esophageal varices     s/p band ligation multiple times, Portal Hypertensive gastropathy;  . S/P TIPS (transjugular intrahepatic portosystemic shunt)     06/2009-TIPS procedure at [facility]  . GI bleed     07/2009-admitted with recurrent upper GI bleed secondary to esophageal varices-banded x3  . Pleural effusion, left     s/p thoracentesis-multiple  . Depression   . Anemia     Iron def requiring parenteral iron or injections in past, followed by Dr. Glenford Peers; B12 def  . Duodenal ulcer 1980s  . Nephrolithiasis     cystoscopy with stone extraction in 1990  . Ascites     Past Surgical History  Procedure Date  . Tips procedure 09/16/09  . 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 Cholecystectomy   . Circumcision -1992  . Colonoscopy -2009    Reportedly normal, done in [place]  . Multiple thoracenteses     Family History  Problem Relation Age of Onset  . Colon cancer Neg Hx   . Inflammatory bowel disease Neg Hx   . Liver disease Neg Hx     History   Social History  . Marital Status: Divorced    Spouse Name: N/A    Number of Children: 1  . Years of Education: N/A   Occupational History  . retired     disabled   Social History Main Topics  . Smoking status: Never Smoker   . Smokeless tobacco: Never Used  . Alcohol Use: No  . Drug Use: No  . Sexually Active: Not on file   Other Topics Concern  . Not on file   Social History Narrative  . No narrative on file      ROS:  General: Negative for anorexia, weight loss, fever, chills, fatigue, weakness. Eyes: Negative for vision changes.  ENT: Negative for hoarseness, difficulty swallowing , nasal congestion. CV: Negative for chest pain, angina, palpitations, dyspnea on exertion. Mild peripheral edema.  Respiratory: Negative for dyspnea at rest, cough, sputum, wheezing. Some DOE.  GI: See history of present illness. GU:  Negative  for dysuria, hematuria, urinary incontinence, urinary frequency, nocturnal urination.  MS: Negative for joint pain, low back pain.  Derm: Negative for rash or itching.  Neuro: Negative for weakness, abnormal sensation, seizure, frequent headaches, memory loss, confusion.  Psych: Negative for anxiety, depression, suicidal ideation, hallucinations.  Endo: Negative for unusual weight change.  Heme: Negative for bruising or bleeding. Allergy: Negative for rash or hives.    Physical Examination:  BP 105/57  Pulse 69  Temp(Src) 97.1 F (36.2 C) (Temporal)  Ht 6\' 1"  (1.854 m)  Wt 236 lb 6.4 oz (107.23 kg)  BMI 31.19 kg/m2   General: Chronically ill-appearing WM in no acute distress.  Head: Normocephalic, atraumatic.   Eyes: Conjunctiva pink, no icterus. Mouth: Oropharyngeal mucosa moist and pink , no lesions erythema or exudate. Neck: Supple without thyromegaly, masses, or lymphadenopathy.  Lungs: Clear to auscultation bilaterally.  Heart: Regular rate and rhythm, no murmurs rubs or gallops.  Abdomen: Bowel sounds are normal, nontender, nondistended, no hepatosplenomegaly or masses, no abdominal bruits. Large umbilical hernia easily reducible and nontender.   Extremities: Trace lower extremity edema.  Neuro: Alert and oriented x 4 , grossly normal neurologically.  Skin: Warm and dry, no rash or jaundice.   Psych: Alert and cooperative, normal mood and affect.

## 2010-06-29 NOTE — Assessment & Plan Note (Signed)
Clinically in remission. CT enterography in 2010 failed to show any active Crohn's. Reportedly his last colonoscopy was around 2008 or 2009 in [place] and was normal. Will determine when next TCS is due.

## 2010-06-29 NOTE — Assessment & Plan Note (Signed)
History of multiple sessions of esophageal variceal banding for GI bleeding. Since his TIPS procedure he did not have any varices at time of his last EGD in November 2011. Per recommendations at time of that procedure he should be able to wait 18 months until next EGD, patient thought he was supposed to have another one at this time. Will verify with Dr. Jena Gauss. Further recommendations to follow.

## 2010-06-29 NOTE — Assessment & Plan Note (Signed)
Some postprandial epigastric discomfort. Some nausea no vomiting. No typical heartburn symptoms. He does have a history of nonaggressive reflux esophagitis the time of EGD November 2011. Trial of increased PPI. He will take Protonix 40 mg twice a day for the next few weeks. New prescription provided. He'll call with a progress report in 2 weeks.

## 2010-07-03 NOTE — Progress Notes (Signed)
agree

## 2010-07-06 NOTE — Progress Notes (Signed)
Cc to Dr. Regino Schultze

## 2010-09-14 ENCOUNTER — Other Ambulatory Visit (HOSPITAL_COMMUNITY): Payer: Medicare Other

## 2010-09-15 ENCOUNTER — Encounter (HOSPITAL_COMMUNITY): Payer: Medicare Other | Attending: Oncology

## 2010-09-15 DIAGNOSIS — D649 Anemia, unspecified: Secondary | ICD-10-CM | POA: Insufficient documentation

## 2010-09-15 DIAGNOSIS — N189 Chronic kidney disease, unspecified: Secondary | ICD-10-CM

## 2010-09-15 LAB — CBC
Platelets: 133 10*3/uL — ABNORMAL LOW (ref 150–400)
RBC: 3.52 MIL/uL — ABNORMAL LOW (ref 4.22–5.81)
RDW: 14.3 % (ref 11.5–15.5)
WBC: 7.6 10*3/uL (ref 4.0–10.5)

## 2010-09-15 LAB — RETICULOCYTES
RBC.: 3.52 MIL/uL — ABNORMAL LOW (ref 4.22–5.81)
Retic Ct Pct: 2.2 % (ref 0.4–3.1)

## 2010-09-15 NOTE — Progress Notes (Signed)
Labs drawn today for cbc,retic,ferr 

## 2010-09-16 ENCOUNTER — Ambulatory Visit (HOSPITAL_COMMUNITY): Payer: Medicare Other

## 2010-09-16 ENCOUNTER — Encounter (HOSPITAL_BASED_OUTPATIENT_CLINIC_OR_DEPARTMENT_OTHER): Payer: Medicare Other

## 2010-09-16 VITALS — BP 124/69 | HR 62 | Temp 97.2°F | Wt 240.4 lb

## 2010-09-16 DIAGNOSIS — D649 Anemia, unspecified: Secondary | ICD-10-CM

## 2010-09-16 DIAGNOSIS — N189 Chronic kidney disease, unspecified: Secondary | ICD-10-CM

## 2010-09-16 DIAGNOSIS — D509 Iron deficiency anemia, unspecified: Secondary | ICD-10-CM

## 2010-09-16 NOTE — Progress Notes (Signed)
CC:   Patrica Duel, M.D. Jonathon Bellows, MD FACP Clementeen Graham  HISTORY:  I am seeing [name] today at the [facility] for followup of his iron-deficiency anemia, which has required feraheme in the past, most recently on 11/03/2009 and 11/10/2009 when his ferritin on 11/04/2010 came back 20.  The patient also is felt to have the anemia of chronic disease in association primarily with cirrhosis of the liver.  The patient was last seen here by Dr. Glenford Peers on 06/16/2010.  In general, he has been doing fairly well.  He was hospitalized and underwent a TIPS procedure at [facility] under the direction of a Dr. Raford Pitcher on 09/16/2009.  At that time, he was having a lot of fluid retention, ascites and peripheral edema.  Those problems have resolved.  The patient does have confusion related to encephalopathy.  He is on lactulose and at his last visit with Dr. Raford Pitcher in either May or June, he was placed on neomycin.  The patient denies any evidence of blood in his stools or melena.  He says he has had that in the past.  He denies any major changes in his condition and is here for reassessment and possible need for additional IV feraheme.  MEDICATIONS:  Medicines are reviewed and recorded.  He is on: 1. Digoxin apparently for atrial fibrillation. 2. Furosemide. 3. Neurontin. 4. Aspartate insulin. 5. Lantus insulin. 6. Lactulose 20 g four times a day. 7. Neomycin 1,000 mg twice daily. 8. Protonix. 9. Inderal 10 mg twice a day. 10.Zocor.  PHYSICAL EXAMINATION:  General Appearance:  The patient is very pleasant gentleman in no acute distress.  His weight today is 240 pounds as compared with 236 pounds at his last visit on 06/16/2010.  Height is 61 inches.  Vital Signs:  Blood pressure 124/69.  Pulse is 62, but slightly irregular, suggestive of atrial fibrillation, which the patient states he has by history.  Other vital signs are normal.  HEENT:  There is no scleral  icterus.  Mouth and pharynx are benign.  Lymphs:  There is no peripheral adenopathy palpable.  Lungs:  Notable for dullness and decreased breath sounds of the left base.  This has been noted before. The patient says he has fluid there.  Cardiac Exam:  Slightly irregular rhythm with no systolic ejection murmur.  Back:  He has a long, well- healed incision over his thoracic spine.  No spinal tenderness. Abdomen:  Somewhat obese and nontender with a fairly large, prominent umbilical hernia measuring 6 x 5 cm.  No obvious ascites.  I was unable to appreciate any hepatosplenomegaly or abdominal masses.  Extremities: No peripheral edema.  He does have stasis changes in both legs.  He uses a cane in his left hand because of poor balance.  LABORATORY DATA:  His laboratory data from 09/15/2010:  Ferritin was 169 as compared with 256 on 06/16/2010, white count 7.6, hemoglobin 10.7, hematocrit 31.7, platelets 133,000.  On 06/16/2010, hemoglobin was 11.4, hematocrit 32.8.  Blood counts are essentially stable.  MCV and MCH are normal.  Retic count was 2.2% with an absolute of 77.4.  IMPRESSION AND PLAN:  The patient seems to be getting along fairly well at the present time.  His ferritin has decreased from 3 months ago when it was 256 and is now 169.  The patient is felt to have intermittent gastrointestinal blood loss.  He has been followed fairly closely, specifically every 3 months.  We have made  another appointment for him to see Dr. Mariel Sleet in 3 months.  At that time, he will have a CBC, diff, retic count and ferritin.  I should mention that the patient states that his appetite is quite good and his energy is poor.  He does have some dyspnea on exertion and slight edema by history, but he did not have any today.  PROBLEM LIST:  The patient's problem list read as follows: 1. Hypertension. 2. Pulmonary hypertension. 3. Hyperlipidemia. 4. History of syncope due to orthostatic hypotension. 5.  Diabetes mellitus. 6. Chronic atrial fibrillation. 7. Chronic renal disease. 8. Crohn's disease, although the patient states that Dr. Jacqualine Mau did not     think he had Crohn's disease. 9. Obesity. 10.Nonalcoholic steatohepatitis. 11.Esophageal varices. 12.Status post TIPS procedure on 09/16/2009. 13.GI bleed dated July 2011. 14.Left pleural effusion. 15.History of depression. 16.Anemia. 17.Duodenal ulcer. 18.Nephrolithiasis. 19.Ascites.    ______________________________ Samul Dada, M.D. DSM/MEDQ  D:  09/16/2010  T:  09/16/2010  Job:  [id number]

## 2010-09-16 NOTE — Patient Instructions (Signed)
[facility]  Discharge Instructions  RECOMMENDATIONS MADE BY THE CONSULTANT AND ANY TEST RESULTS WILL BE SENT TO YOUR REFERRING DOCTOR.   EXAM FINDINGS BY MD TODAY AND SIGNS AND SYMPTOMS TO REPORT TO CLINIC OR PRIMARY MD: discussed per Dr. Arline Asp  MEDICATIONS PRESCRIBED:  Continue all meds   SPECIAL INSTRUCTIONS/FOLLOW-UP: Return to Clinic in 3 months with labwork a few days before   I acknowledge that I have been informed and understand all the instructions given to me and received a copy. I do not have any more questions at this time, but understand that I may call the Specialty Clinic at [facility] at [phone] during business hours should I have any further questions or need assistance in obtaining follow-up care.    __________________________________________  _____________  __________ Signature of Patient or Authorized Representative            Date                   Time    __________________________________________ Nurse's Signature

## 2010-09-16 NOTE — Progress Notes (Signed)
Progress note dictated.

## 2010-09-17 ENCOUNTER — Telehealth: Payer: Self-pay | Admitting: Gastroenterology

## 2010-09-17 NOTE — Telephone Encounter (Signed)
Please let pt know. Discussed with RMR. He recommends next EGD for esophageal variceal surveillance in Dec 2012 or Jan 2013. He will discuss timing of next TCS at next OV.   Please schedule OV with RMR in 12/2010.

## 2010-09-18 NOTE — Telephone Encounter (Signed)
Pt aware, please schedule ov 

## 2010-09-23 ENCOUNTER — Encounter: Payer: Self-pay | Admitting: Internal Medicine

## 2010-09-23 LAB — CBC
HCT: 33.6 — ABNORMAL LOW
Hemoglobin: 10.6 — ABNORMAL LOW
MCHC: 31.7
MCV: 78.1
Platelets: 122 — ABNORMAL LOW
RBC: 4.3
RDW: 19.7 — ABNORMAL HIGH
WBC: 6.4

## 2010-09-23 NOTE — Telephone Encounter (Signed)
Pt is aware of his OV for 12/6 at 3pm with RMR and appt card was mailed

## 2010-09-24 LAB — CBC
HCT: 31.8 — ABNORMAL LOW
MCHC: 32
MCV: 77.6 — ABNORMAL LOW
Platelets: 122 — ABNORMAL LOW
RDW: 20 — ABNORMAL HIGH
WBC: 6.3

## 2010-09-25 LAB — PROTEIN, BODY FLUID: Total protein, fluid: <3

## 2010-09-25 LAB — BODY FLUID CELL COUNT WITH DIFFERENTIAL
Eos, Fluid: 0
Monocyte-Macrophage-Serous Fluid: 95 — ABNORMAL HIGH
Total Nucleated Cell Count, Fluid: 200

## 2010-09-25 LAB — GLUCOSE, SEROUS FLUID

## 2010-09-25 LAB — BODY FLUID CULTURE: Culture: NO GROWTH

## 2010-09-25 LAB — LACTATE DEHYDROGENASE, PLEURAL OR PERITONEAL FLUID: LD, Fluid: 39 — ABNORMAL HIGH

## 2010-09-28 LAB — AFB CULTURE WITH SMEAR (NOT AT ARMC): Acid Fast Smear: NONE SEEN

## 2010-09-28 LAB — CBC
MCHC: 32.8
MCV: 77.6 — ABNORMAL LOW
MCV: 77 — ABNORMAL LOW
Platelets: 100 — ABNORMAL LOW
Platelets: 115 — ABNORMAL LOW
RBC: 4.06 — ABNORMAL LOW
RDW: 19.8 — ABNORMAL HIGH
WBC: 6.8

## 2010-09-28 LAB — ANAEROBIC CULTURE: Gram Stain: NONE SEEN

## 2010-09-28 LAB — BODY FLUID CULTURE: Gram Stain: NONE SEEN

## 2010-09-28 LAB — BODY FLUID CELL COUNT WITH DIFFERENTIAL
Eos, Fluid: 0
Monocyte-Macrophage-Serous Fluid: 55
Total Nucleated Cell Count, Fluid: 710

## 2010-09-29 LAB — CBC
HCT: 30.3 — ABNORMAL LOW
Hemoglobin: 9.9 — ABNORMAL LOW
MCHC: 32.7
RBC: 3.99 — ABNORMAL LOW
RDW: 20.2 — ABNORMAL HIGH

## 2010-09-29 LAB — BODY FLUID CULTURE
Culture: NO GROWTH
Gram Stain: NONE SEEN

## 2010-09-29 LAB — GLUCOSE, SEROUS FLUID: Glucose, Fluid: 200

## 2010-09-29 LAB — BODY FLUID CELL COUNT WITH DIFFERENTIAL
Eos, Fluid: 0
Neutrophil Count, Fluid: 5
Total Nucleated Cell Count, Fluid: 24

## 2010-09-29 LAB — PROTEIN, BODY FLUID

## 2010-09-30 LAB — CBC
Hemoglobin: 10 — ABNORMAL LOW
MCHC: 33.4
MCHC: 32.8
MCV: 75.7 — ABNORMAL LOW
MCV: 76.3 — ABNORMAL LOW
Platelets: 113 — ABNORMAL LOW
RBC: 3.96 — ABNORMAL LOW
RDW: 20.7 — ABNORMAL HIGH
RDW: 20.6 — ABNORMAL HIGH

## 2010-10-01 LAB — DIFFERENTIAL
Basophils Absolute: 0
Basophils Absolute: 0
Basophils Relative: 1
Eosinophils Absolute: 0.1
Lymphocytes Relative: 8 — ABNORMAL LOW
Lymphs Abs: 0.3 — ABNORMAL LOW
Lymphs Abs: 0.3 — ABNORMAL LOW
Monocytes Absolute: 0.6
Monocytes Relative: 16 — ABNORMAL HIGH
Neutro Abs: 4.8
Neutro Abs: 2.7
Neutrophils Relative %: 73
Neutrophils Relative %: 76

## 2010-10-01 LAB — CROSSMATCH: Antibody Screen: NEGATIVE

## 2010-10-01 LAB — CBC
HCT: 31.5 — ABNORMAL LOW
HCT: 26.3 — ABNORMAL LOW
HCT: 27.1 — ABNORMAL LOW
Hemoglobin: 10.2 — ABNORMAL LOW
Hemoglobin: 8.4 — ABNORMAL LOW
MCHC: 33
MCHC: 32.2
MCV: 75.5 — ABNORMAL LOW
MCV: 75.7 — ABNORMAL LOW
MCV: 76.4 — ABNORMAL LOW
MCV: 76.4 — ABNORMAL LOW
MCV: 78.4
Platelets: 104 — ABNORMAL LOW
Platelets: 107 — ABNORMAL LOW
Platelets: 140 — ABNORMAL LOW
Platelets: 101 — ABNORMAL LOW
RBC: 3.83 — ABNORMAL LOW
RBC: 4.16 — ABNORMAL LOW
RBC: 3.44 — ABNORMAL LOW
RBC: 3.55 — ABNORMAL LOW
RDW: 19.7 — ABNORMAL HIGH
RDW: 19.2 — ABNORMAL HIGH
WBC: 5.8
WBC: 5.9
WBC: 3.4 — ABNORMAL LOW
WBC: 6.1

## 2010-10-01 LAB — COMPREHENSIVE METABOLIC PANEL
Albumin: 3 — ABNORMAL LOW
Alkaline Phosphatase: 113
BUN: 23
BUN: 24 — ABNORMAL HIGH
CO2: 27
CO2: 26
Calcium: 8.2 — ABNORMAL LOW
Chloride: 105
Creatinine, Ser: 1.41
GFR calc Af Amer: >60
GFR calc non Af Amer: 58 — ABNORMAL LOW
GFR calc non Af Amer: 51 — ABNORMAL LOW
Glucose, Bld: 231 — ABNORMAL HIGH
Glucose, Bld: 203 — ABNORMAL HIGH
Potassium: 5
Total Bilirubin: 0.9

## 2010-10-01 LAB — BASIC METABOLIC PANEL
BUN: 23
CO2: 26
Chloride: 104
Chloride: 103
Creatinine, Ser: 1.16
Creatinine, Ser: 1.28
GFR calc Af Amer: >60
Potassium: 4.5

## 2010-10-01 LAB — HEMOGLOBIN AND HEMATOCRIT, BLOOD: HCT: 28.4 — ABNORMAL LOW

## 2010-10-01 LAB — DIGOXIN LEVEL: Digoxin Level: 0.6 — ABNORMAL LOW

## 2010-10-01 LAB — POCT CARDIAC MARKERS: Troponin i, poc: <0.05

## 2010-10-01 LAB — B-NATRIURETIC PEPTIDE (CONVERTED LAB)
Pro B Natriuretic peptide (BNP): 66.3
Pro B Natriuretic peptide (BNP): <30

## 2010-10-01 LAB — TSH: TSH: 3.788 (ref 0.350–4.500)

## 2010-10-01 LAB — MAGNESIUM: Magnesium: 1.6

## 2010-10-02 LAB — CBC
HCT: 31.2 — ABNORMAL LOW
Hemoglobin: 10.1 — ABNORMAL LOW
Platelets: 93 — ABNORMAL LOW
RDW: 20 — ABNORMAL HIGH
WBC: 5.4

## 2010-10-05 LAB — CBC
HCT: 32.8 — ABNORMAL LOW
Hemoglobin: 10.6 — ABNORMAL LOW
Hemoglobin: 10.6 — ABNORMAL LOW
MCHC: 32.2
MCV: 79.4
RBC: 4.09 — ABNORMAL LOW
RBC: 4.12 — ABNORMAL LOW
RDW: 19.5 — ABNORMAL HIGH
RDW: 19.4 — ABNORMAL HIGH

## 2010-10-06 ENCOUNTER — Other Ambulatory Visit: Payer: Self-pay | Admitting: Gastroenterology

## 2010-10-06 LAB — CBC
MCHC: 32.5
MCV: 80
Platelets: 70 — ABNORMAL LOW
RBC: 4.03 — ABNORMAL LOW
RDW: 19.8 — ABNORMAL HIGH

## 2010-10-08 LAB — CBC
MCV: 80.7 fL (ref 78.0–100.0)
Platelets: 92 10*3/uL — ABNORMAL LOW (ref 150–400)
RBC: 4.02 MIL/uL — ABNORMAL LOW (ref 4.22–5.81)
WBC: 6.9 10*3/uL (ref 4.0–10.5)

## 2010-10-12 LAB — CBC
HCT: 32.3 — ABNORMAL LOW
Hemoglobin: 10.3 — ABNORMAL LOW
MCV: 77.5 — ABNORMAL LOW
Platelets: 109 — ABNORMAL LOW
RBC: 4.17 — ABNORMAL LOW
WBC: 6.1

## 2010-10-13 LAB — CBC
HCT: 32.4 — ABNORMAL LOW
Hemoglobin: 10.4 — ABNORMAL LOW
MCV: 78.1
Platelets: 107 — ABNORMAL LOW
RDW: 19.8 — ABNORMAL HIGH

## 2010-10-14 LAB — CBC
MCHC: 32.9
MCV: 78.1
Platelets: 110 — ABNORMAL LOW
RDW: 20 — ABNORMAL HIGH
WBC: 5.3

## 2010-10-15 LAB — CBC
HCT: 31.4 — ABNORMAL LOW
Platelets: 110 — ABNORMAL LOW
RDW: 19.4 — ABNORMAL HIGH
WBC: 4.6

## 2010-10-16 LAB — CBC
HCT: 31.4 — ABNORMAL LOW
HCT: 31.8 — ABNORMAL LOW
Hemoglobin: 10.1 — ABNORMAL LOW
Hemoglobin: 10.3 — ABNORMAL LOW
MCHC: 32.5
MCV: 78.6
RBC: 4 — ABNORMAL LOW
RBC: 4.05 — ABNORMAL LOW
RDW: 19.1 — ABNORMAL HIGH
WBC: 6.1

## 2010-10-19 LAB — CBC
HCT: 30.1 — ABNORMAL LOW
Hemoglobin: 10.1 — ABNORMAL LOW
Hemoglobin: 10.3 — ABNORMAL LOW
MCHC: 32.5
MCHC: 32.2
MCHC: 32.5
MCV: 77.9 — ABNORMAL LOW
MCV: 77.7 — ABNORMAL LOW
MCV: 78.8
Platelets: 106 — ABNORMAL LOW
RBC: 3.88 — ABNORMAL LOW
RBC: 4.07 — ABNORMAL LOW
RDW: 18.6 — ABNORMAL HIGH
RDW: 17.7 — ABNORMAL HIGH
WBC: 4.1
WBC: 4.9

## 2010-10-19 LAB — DIFFERENTIAL
Basophils Absolute: 0
Basophils Absolute: 0
Eosinophils Absolute: 0.1
Eosinophils Relative: 1
Eosinophils Relative: 1
Lymphocytes Relative: 8 — ABNORMAL LOW
Lymphocytes Relative: 10 — ABNORMAL LOW
Lymphocytes Relative: 10 — ABNORMAL LOW
Lymphs Abs: 0.4 — ABNORMAL LOW
Lymphs Abs: 0.4 — ABNORMAL LOW
Monocytes Absolute: 0.7
Monocytes Relative: 12 — ABNORMAL HIGH
Neutro Abs: 3
Neutro Abs: 3.6
Neutrophils Relative %: 78 — ABNORMAL HIGH
Neutrophils Relative %: 74

## 2010-10-19 LAB — COMPREHENSIVE METABOLIC PANEL
ALT: 23
AST: 26
BUN: 24 — ABNORMAL HIGH
CO2: 30
CO2: 24
Calcium: 8.6
Calcium: 8.9
Chloride: 107
Creatinine, Ser: 1.11
Creatinine, Ser: 1.33
GFR calc Af Amer: >60
GFR calc non Af Amer: >60
GFR calc non Af Amer: 55 — ABNORMAL LOW
Sodium: 134 — ABNORMAL LOW
Total Bilirubin: 0.9
Total Protein: 7.3

## 2010-10-19 LAB — BASIC METABOLIC PANEL
Calcium: 8.3 — ABNORMAL LOW
GFR calc non Af Amer: >60
Glucose, Bld: 71
Sodium: 137

## 2010-10-19 LAB — CULTURE, BLOOD (ROUTINE X 2)
Culture: NO GROWTH
Culture: NO GROWTH
Report Status: 7282008

## 2010-10-19 LAB — HEMOGLOBIN A1C: Hgb A1c MFr Bld: 8.4 — ABNORMAL HIGH

## 2010-10-19 LAB — OCCULT BLOOD X 1 CARD TO LAB, STOOL: Fecal Occult Bld: NEGATIVE

## 2010-10-19 LAB — TSH: TSH: 2.703

## 2010-10-19 LAB — IRON AND TIBC
Iron: 47
Saturation Ratios: 13 — ABNORMAL LOW
UIBC: 328

## 2010-10-19 LAB — DIGOXIN LEVEL: Digoxin Level: 0.6 — ABNORMAL LOW

## 2010-10-20 LAB — CBC
MCHC: 32.5
MCV: 79.2
Platelets: 91 — ABNORMAL LOW
RDW: 17.8 — ABNORMAL HIGH

## 2010-10-21 LAB — CBC
HCT: 28.1 — ABNORMAL LOW
MCV: 78.5
Platelets: 99 — ABNORMAL LOW
RDW: 18.4 — ABNORMAL HIGH

## 2010-11-30 ENCOUNTER — Encounter: Payer: Self-pay | Admitting: Internal Medicine

## 2010-12-08 ENCOUNTER — Encounter: Payer: Self-pay | Admitting: Internal Medicine

## 2010-12-08 ENCOUNTER — Ambulatory Visit (INDEPENDENT_AMBULATORY_CARE_PROVIDER_SITE_OTHER): Payer: Medicare Other | Admitting: Internal Medicine

## 2010-12-08 VITALS — BP 103/56 | HR 61 | Temp 98.0°F | Ht 73.0 in | Wt 249.0 lb

## 2010-12-08 DIAGNOSIS — D649 Anemia, unspecified: Secondary | ICD-10-CM

## 2010-12-08 NOTE — Patient Instructions (Signed)
Continue current medical regimen.  No need for a repeat EGD at this time.  Will check her stool for occult blood.  We'll plan to see back in the office in 6 months

## 2010-12-08 NOTE — Progress Notes (Signed)
Primary Care Physician:  Kirk Ruths, MD Primary Gastroenterologist:  Dr.   Pre-Procedure History & Physical: HPI:  [name] is a 65 y.o. male here for followup. History of Elita Boone cirrhosis followed closely by Dr. at [facility] hepatology clinic. Patient which is seen down there last month. He has a low meld score currently not listed for transplant. Status post TIPS couple of years ago male. Doing very well no significant cephalopathy now on neomycin and lactulose. Last EGD of the liver ureter demonstrated no evidence of esophageal varices. Hx of  Iron deficiency anemia; negative colonoscopy Dr. Arlyce Dice in [facility] 2008.  Questionable history of Crohn's disease no specific therapy at this time.  Past Medical History  Diagnosis Date  . Hypertension     negative stress nuclear study in 02/2007; normal coronaries in 2006; Echo in 2006-aortic sclerosis, Mild biatrial enlargement; normal LV  . Pulmonary hypertension     moderate at cath-2006  . Hyperlipidemia   . Syncope 2008    orthostatic hypotension  . Diabetes mellitus     insulin; onset in 1999; retinopathy, neuropathy and nephropathy  . Chronic atrial fibrillation     onset in 1995; anticoagulation discontinued in 2006  . Chronic kidney disease     stage III; creatinine of 1.6 in 8/07, 1.35 and 05/2008  . Crohn's disease     -lower GI bleed in 08/2002;  Pentasa stopped by Dr. Brooke Dare at [facility] ; clinical remission.  . Obesity   . NASH (nonalcoholic steatohepatitis)     cirrhosis with esophageal varices; h/o GI bleeding. EGD-2010 grade 2 esophageal varices-banded with good hemostasis; portal gastropathy, friable gastric mucosa; small hiatal hernia.  EGD during recurrent melena one week later showed 2 eschars at previously placed bands, decompressed esophageal varices grade 1-2,four columns, no bleeding stigmata; portal gastropathy, normal appearing small bowel.    . Esophageal varices     s/p band ligation multiple times, Portal  Hypertensive gastropathy;  . S/P TIPS (transjugular intrahepatic portosystemic shunt)     06/2009-TIPS procedure at [facility]  . GI bleed     07/2009-admitted with recurrent upper GI bleed secondary to esophageal varices-banded x3  . Pleural effusion, left     s/p thoracentesis-multiple  . Depression   . Anemia     Iron def requiring parenteral iron or injections in past, followed by Dr. Glenford Peers; B12 def  . Duodenal ulcer 1980s  . Nephrolithiasis     cystoscopy with stone extraction in 1990  . Ascites   . Hiatal hernia 12/02/2009  . Esophageal erosions     consistent with mild erosive reflux esophagitis  . Portal hypertensive gastropathy     Past Surgical History  Procedure Date  . Tips procedure 09/16/09  . 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 Cholecystectomy   . Circumcision -1992  . Colonoscopy -2009    Reportedly normal, done in [place]  . Multiple thoracenteses   . Esophagogastroduodenoscopy 12/02/2009    distal esophageal erosions, small hiatal hernia  . Tips procedure 2011    Prior to Admission medications   Medication Sig Start Date End Date Taking? Authorizing Provider  digoxin (LANOXIN) 0.125 MG tablet Take 125 mcg by mouth daily. 1/2 a pill  05/25/10  Yes Gerrit Friends. Rothbart, MD  furosemide (LASIX) 40 MG tablet Take 40 mg by mouth daily.    Yes Historical Provider, MD  gabapentin (NEURONTIN) 300 MG capsule 300 mg  at bedtime.  05/29/10  Yes Historical Provider, MD  insulin aspart (NOVOLOG) 100 UNIT/ML injection Inject into the skin as directed.    Yes Historical Provider, MD  insulin glargine (LANTUS) 100 UNIT/ML injection Inject 70 Units into the skin at bedtime.    Yes Historical Provider, MD  lactulose (CHRONULAC) 10 GM/15ML solution Take 20 g by mouth 4 (four) times daily.     Yes Historical Provider, MD  neomycin (MYCIFRADIN) 500 MG tablet Take 1,000 mg by mouth 2 (two) times daily.     Yes  Historical Provider, MD  propranolol (INDERAL) 10 MG tablet Take 10 mg by mouth 2 (two) times daily.    Yes Historical Provider, MD  PROTONIX 40 MG tablet TAKE (1) TABLET BY MOUTH TWICE DAILY. 10/06/10  Yes Gerrit Halls, NP  simvastatin (ZOCOR) 40 MG tablet Take 40 mg by mouth at bedtime.     Yes Historical Provider, MD    Allergies as of 12/08/2010 - Review Complete 12/08/2010  Allergen Reaction Noted  . Codeine Other (See Comments)   . Other Rash 05/05/2010    Family History  Problem Relation Age of Onset  . Colon cancer Neg Hx   . Inflammatory bowel disease Neg Hx   . Liver disease Neg Hx     History   Social History  . Marital Status: Divorced    Spouse Name: N/A    Number of Children: 1  . Years of Education: N/A   Occupational History  . retired     disabled   Social History Main Topics  . Smoking status: Never Smoker   . Smokeless tobacco: Never Used  . Alcohol Use: No  . Drug Use: No  . Sexually Active: Not on file   Other Topics Concern  . Not on file   Social History Narrative  . No narrative on file    Review of Systems: See HPI, otherwise negative ROS  Physical Exam: BP 103/56  Pulse 61  Temp(Src) 98 F (36.7 C) (Temporal)  Ht 6\' 1"  (1.854 m)  Wt 249 lb (112.946 kg)  BMI 32.85 kg/m2 General:   Alert,  Well-developed,  pleasant and cooperative in NAD.  Well oriented. No asterixis. Skin:  Intact without significant lesions or rashes. Eyes:  Sclera clear, no icterus.   Conjunctiva pink. Ears:  Normal auditory acuity. Nose:  No deformity, discharge,  or lesions. Mouth:  No deformity or lesions. Neck:  Supple; no masses or thyromegaly. No significant cervical adenopathy. Lungs:  Clear throughout to auscultation.   No wheezes, crackles, or rhonchi. No acute distress. Heart:  Regular rate and rhythm; no murmurs, clicks, rubs,  or gallops. Abdomen: Non-distended, normal bowel sounds.  Soft and nontender without appreciable mass or hepatosplenomegaly.    Pulses:  Normal pulses noted. Extremities:  Without clubbing or edema.  Impression/Plan:

## 2010-12-10 ENCOUNTER — Ambulatory Visit: Payer: Medicare Other | Admitting: Internal Medicine

## 2010-12-14 ENCOUNTER — Encounter (HOSPITAL_COMMUNITY): Payer: Medicare Other | Attending: Oncology

## 2010-12-14 ENCOUNTER — Ambulatory Visit: Payer: Medicare Other | Admitting: Gastroenterology

## 2010-12-14 DIAGNOSIS — D649 Anemia, unspecified: Secondary | ICD-10-CM | POA: Insufficient documentation

## 2010-12-14 LAB — CBC
MCHC: 33.4 g/dL (ref 30.0–36.0)
Platelets: 112 10*3/uL — ABNORMAL LOW (ref 150–400)
RDW: 15.2 % (ref 11.5–15.5)
WBC: 6.5 10*3/uL (ref 4.0–10.5)

## 2010-12-14 LAB — RETICULOCYTES
RBC.: 3.4 MIL/uL — ABNORMAL LOW (ref 4.22–5.81)
Retic Count, Absolute: 91.8 10*3/uL (ref 19.0–186.0)
Retic Ct Pct: 2.7 % (ref 0.4–3.1)

## 2010-12-14 LAB — DIFFERENTIAL
Basophils Absolute: 0 10*3/uL (ref 0.0–0.1)
Basophils Relative: 1 % (ref 0–1)
Lymphocytes Relative: 10 % — ABNORMAL LOW (ref 12–46)
Neutro Abs: 4.9 10*3/uL (ref 1.7–7.7)
Neutrophils Relative %: 75 % (ref 43–77)

## 2010-12-14 NOTE — Progress Notes (Signed)
To be addressed by RMR. Positive ifobt, ordered by RMR.

## 2010-12-14 NOTE — Progress Notes (Signed)
Labs drawn today for cbc/diff,retic,ferr 

## 2010-12-16 ENCOUNTER — Encounter (HOSPITAL_BASED_OUTPATIENT_CLINIC_OR_DEPARTMENT_OTHER): Payer: Medicare Other | Admitting: Oncology

## 2010-12-16 VITALS — BP 111/57 | HR 71 | Temp 97.4°F | Ht 73.0 in | Wt 241.8 lb

## 2010-12-16 DIAGNOSIS — K7460 Unspecified cirrhosis of liver: Secondary | ICD-10-CM

## 2010-12-16 DIAGNOSIS — D638 Anemia in other chronic diseases classified elsewhere: Secondary | ICD-10-CM

## 2010-12-16 DIAGNOSIS — D649 Anemia, unspecified: Secondary | ICD-10-CM

## 2010-12-16 DIAGNOSIS — D509 Iron deficiency anemia, unspecified: Secondary | ICD-10-CM

## 2010-12-16 MED ORDER — SODIUM CHLORIDE 0.9 % IV SOLN: 1020.0000 mg | Freq: Once | INTRAVENOUS | Status: DC

## 2010-12-16 NOTE — Progress Notes (Unsigned)
RMR called- he wants pt to have a tcs+/- egd for heme +stool and IDA. I have tried to call pt- LMOM

## 2010-12-16 NOTE — Progress Notes (Signed)
CC:   Willie Bellows, MD FACP Raynald Blend, M.D.  DIAGNOSES: 1. Cirrhosis of the liver with varices. 2. Anemia multifactorial with iron deficiency and an element of anemia     of chronic disease with a recent Hemoccult-positive stool, and a     decrease in his ferritin from 256 June 16, 2010 to 25 on     12/14/2010. 3. Obesity. 4. TIPS shunt for cirrhosis. 5. Hypercholesterolemia. 6. Hypertension. 7. History diabetes mellitus. 8. History of Crohn's disease. 9. History of depression which I think is stable. His hemoglobin did drop also from 11.4 in June to 9.9 most recently. MCV has not really changed.  White count and platelets remain stable though his platelets are chronically low around 112 to 124,000.  He is not aware of any blood in the stools visually, but he did have an IFOBT positive on 12/10.  So I have talked to Dr. Jena Gauss who plans to do a colonoscopy plus or minus EGD in follow up.  We are going to give him Feraheme on Monday and keep an eye on him 2 months later with a CBC and ferritin.  He is fine with this plan and is ready to proceed with [facility] Monday.  We could have done it today, but he wanted to postpone this till then.    ______________________________ Ladona Horns. Mariel Sleet, MD ESN/MEDQ  D:  12/16/2010  T:  12/16/2010  Job:  [id number]

## 2010-12-16 NOTE — Progress Notes (Signed)
This office note has been dictated.

## 2010-12-16 NOTE — Patient Instructions (Signed)
[facility]  Discharge Instructions WITTEN CERTAIN  [id number] [birth year]  RECOMMENDATIONS MADE BY THE CONSULTANT AND ANY TEST RESULTS WILL BE SENT TO YOUR REFERRING DOCTOR.   EXAM FINDINGS BY MD TODAY AND SIGNS AND SYMPTOMS TO REPORT TO CLINIC OR PRIMARY MD:  Need to do iron infusion next week  SPECIAL INSTRUCTIONS/FOLLOW-UP: Lab work Needed  In February then to see Tom.   I acknowledge that I have been informed and understand all the instructions given to me and received a copy. I do not have any more questions at this time, but understand that I may call the Specialty Clinic at [facility] at [phone] during business hours should I have any further questions or need assistance in obtaining follow-up care.    __________________________________________  _____________  __________ Signature of Patient or Authorized Representative            Date                   Time    __________________________________________ Nurse's Signature

## 2010-12-17 ENCOUNTER — Ambulatory Visit: Payer: Medicare Other | Admitting: Internal Medicine

## 2010-12-21 ENCOUNTER — Telehealth: Payer: Self-pay | Admitting: Internal Medicine

## 2010-12-21 ENCOUNTER — Encounter (HOSPITAL_BASED_OUTPATIENT_CLINIC_OR_DEPARTMENT_OTHER): Payer: Medicare Other

## 2010-12-21 DIAGNOSIS — D509 Iron deficiency anemia, unspecified: Secondary | ICD-10-CM

## 2010-12-21 DIAGNOSIS — D649 Anemia, unspecified: Secondary | ICD-10-CM

## 2010-12-21 MED ORDER — SODIUM CHLORIDE 0.9 % IV SOLN
1020.0000 mg | Freq: Once | INTRAVENOUS | Status: AC
  Administered 2010-12-21: 1020 mg via INTRAVENOUS
  Filled 2010-12-21: qty 34

## 2010-12-21 MED ORDER — SODIUM CHLORIDE 0.9 % IJ SOLN
INTRAMUSCULAR | Status: AC
  Administered 2010-12-21: 10 mL via INTRAVENOUS
  Filled 2010-12-21: qty 10

## 2010-12-21 MED ORDER — SODIUM CHLORIDE 0.9 % IJ SOLN
10.0000 mL | INTRAMUSCULAR | Status: DC | PRN
  Administered 2010-12-21: 10 mL via INTRAVENOUS
  Filled 2010-12-21: qty 10

## 2010-12-21 NOTE — Telephone Encounter (Signed)
Pt came to window this afternoon and wanted to set up his procedures. He said that he had several messages from [place]. I brought his insurance card with him and I made a copy, but told him that CD was out of office today and CM was at lunch , but he was welcome to wait or I would have CM call him when she got back. Pt said he would call us back to set procedure up.

## 2010-12-21 NOTE — Assessment & Plan Note (Addendum)
NASH fairly well compensated S/PTIPS. Encephalopathy in remission. Hx of IDA - parenteral Iron per Dr. Mariel Sleet.  Last TCS 2008 elsewhere. Clinically, no bleeding  Recommendations: Continue current medical regimen.  No need for a repeat EGD at this time.  Will check her stool for occult blood.  We'll plan to see back in the office in 6 months

## 2010-12-21 NOTE — Telephone Encounter (Signed)
Pt is coming in to see Verlon Au for an updated ov to set up tcs/egd [id number].

## 2011-01-09 ENCOUNTER — Other Ambulatory Visit: Payer: Self-pay | Admitting: Cardiology

## 2011-01-11 ENCOUNTER — Ambulatory Visit (INDEPENDENT_AMBULATORY_CARE_PROVIDER_SITE_OTHER): Payer: No Typology Code available for payment source | Admitting: Gastroenterology

## 2011-01-11 ENCOUNTER — Encounter: Payer: Self-pay | Admitting: Gastroenterology

## 2011-01-11 VITALS — BP 84/44 | HR 78 | Temp 98.5°F | Ht 73.0 in | Wt 236.4 lb

## 2011-01-11 DIAGNOSIS — K5090 Crohn's disease, unspecified, without complications: Secondary | ICD-10-CM

## 2011-01-11 DIAGNOSIS — R195 Other fecal abnormalities: Secondary | ICD-10-CM

## 2011-01-11 DIAGNOSIS — K7581 Nonalcoholic steatohepatitis (NASH): Secondary | ICD-10-CM

## 2011-01-11 DIAGNOSIS — I8500 Esophageal varices without bleeding: Secondary | ICD-10-CM

## 2011-01-11 DIAGNOSIS — D649 Anemia, unspecified: Secondary | ICD-10-CM

## 2011-01-11 DIAGNOSIS — K7689 Other specified diseases of liver: Secondary | ICD-10-CM

## 2011-01-11 MED ORDER — PEG-KCL-NACL-NASULF-NA ASC-C 100 G PO SOLR: 1.0000 | Freq: Once | ORAL | Status: DC

## 2011-01-11 NOTE — Progress Notes (Signed)
Primary Care Physician:  Kirk Ruths, MD  Primary Gastroenterologist:  Roetta Sessions, MD   Chief Complaint  Patient presents with  . colonoscopy +/- EGD    HPI:  [name] is a 66 y.o. male here to schedule colonoscopy with possible upper endoscopy for worsening iron deficiency anemia and Hemoccult-positive stool. His hemoglobin has went from 11.4 in June down to 9.9 in December. Dr. Glenford Peers checked a hemoccult which was positive. Discussion with Dr. Jena Gauss, decision to repeat colonoscopy with possible EGD.   Last colonoscopy done in [place] about 4 years ago reportedly negative. Patient has h/o Crohn's but has not been on medication in years, stopped by Dr. Brooke Dare. SB looked okay on CTE in 2010.  No melena, brbpr. No abdominal pain, heartburn, dysphagia, vomiting, swelling, weight loss. BM 1-2 daily. Continues to follow up at [facility] liver clinic. Abd u/s in 10/2010 for hepatoma surveillance. Due to go back 02/01/2011 at [facility] liver clinic.   Current Outpatient Prescriptions  Medication Sig Dispense Refill  . furosemide (LASIX) 40 MG tablet Take 40 mg by mouth daily.       Marland Kitchen gabapentin (NEURONTIN) 300 MG capsule 300 mg at bedtime.       . insulin aspart (NOVOLOG) 100 UNIT/ML injection Inject into the skin as directed.       . insulin glargine (LANTUS) 100 UNIT/ML injection Inject 80 Units into the skin at bedtime.       Marland Kitchen lactulose (CHRONULAC) 10 GM/15ML solution Take 20 g by mouth 4 (four) times daily.        Marland Kitchen LANOXIN 0.125 MG tablet TAKE ONE TABLET DAILY.  30 each  6  . neomycin (MYCIFRADIN) 500 MG tablet Take 1,000 mg by mouth 2 (two) times daily.        . propranolol (INDERAL) 10 MG tablet Take 10 mg by mouth 2 (two) times daily.       Marland Kitchen PROTONIX 40 MG tablet TAKE (1) TABLET BY MOUTH TWICE DAILY.  60 each  3  . simvastatin (ZOCOR) 40 MG tablet Take 40 mg by mouth at bedtime.          Allergies as of 01/11/2011 - Review Complete 01/11/2011  Allergen Reaction  Noted  . Codeine Other (See Comments)   . Other Rash 05/05/2010    Past Medical History  Diagnosis Date  . Hypertension     negative stress nuclear study in 02/2007; normal coronaries in 2006; Echo in 2006-aortic sclerosis, Mild biatrial enlargement; normal LV  . Pulmonary hypertension     moderate at cath-2006  . Hyperlipidemia   . Syncope 2008    orthostatic hypotension  . Diabetes mellitus     insulin; onset in 1999; retinopathy, neuropathy and nephropathy  . Chronic atrial fibrillation     onset in 1995; anticoagulation discontinued in 2006  . Chronic kidney disease     stage III; creatinine of 1.6 in 8/07, 1.35 and 05/2008  . Crohn's disease     -lower GI bleed in 08/2002;  Pentasa stopped by Dr. Brooke Dare at [facility] ; clinical remission.  . Obesity   . NASH (nonalcoholic steatohepatitis)     cirrhosis with esophageal varices; h/o GI bleeding. EGD-2010 grade 2 esophageal varices-banded with good hemostasis; portal gastropathy, friable gastric mucosa; small hiatal hernia.  EGD during recurrent melena one week later showed 2 eschars at previously placed bands, decompressed esophageal varices grade 1-2,four columns, no bleeding stigmata; portal gastropathy, normal appearing small bowel.    Marland Kitchen  Esophageal varices     s/p band ligation multiple times, Portal Hypertensive gastropathy;  . S/P TIPS (transjugular intrahepatic portosystemic shunt)     06/2009-TIPS procedure at [facility]  . GI bleed     07/2009-admitted with recurrent upper GI bleed secondary to esophageal varices-banded x3  . Pleural effusion, left     s/p thoracentesis-multiple  . Depression   . Anemia     Iron def requiring parenteral iron or injections in past, followed by Dr. Glenford Peers; B12 def  . Duodenal ulcer 1980s  . Nephrolithiasis     cystoscopy with stone extraction in 1990  . Ascites   . Hiatal hernia 12/02/2009  . Esophageal erosions     consistent with mild erosive reflux esophagitis  . Portal hypertensive  gastropathy     Past Surgical History  Procedure Date  . Tips procedure 09/16/09  . 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 Cholecystectomy   . Circumcision -1992  . Colonoscopy -2008    Reportedly normal, done in [place]  . Multiple thoracenteses   . Esophagogastroduodenoscopy 12/02/2009    distal esophageal erosions, small hiatal hernia  . Tips procedure 2011    Family History  Problem Relation Age of Onset  . Colon cancer Neg Hx   . Inflammatory bowel disease Neg Hx   . Liver disease Neg Hx     History   Social History  . Marital Status: Divorced    Spouse Name: N/A    Number of Children: 1  . Years of Education: N/A   Occupational History  . retired     disabled   Social History Main Topics  . Smoking status: Never Smoker   . Smokeless tobacco: Never Used  . Alcohol Use: No  . Drug Use: No  . Sexually Active: Not on file   Other Topics Concern  . Not on file   Social History Narrative  . No narrative on file      ROS:  General: Negative for anorexia, weight loss, fever, chills. C/O generalized weakness, fatigue. Eyes: Negative for vision changes.  ENT: Negative for hoarseness, difficulty swallowing , nasal congestion. CV: Negative for chest pain, angina, palpitations, peripheral edema. Some DOE. Respiratory: Negative for dyspnea at rest, cough, sputum, wheezing. SOB with significant exertion. GI: See history of present illness. GU:  Negative for dysuria, hematuria, urinary incontinence, urinary frequency, nocturnal urination.  MS: Negative for joint pain, low back pain.  Derm: Negative for rash or itching.  Neuro: Negative for weakness, abnormal sensation, seizure, frequent headaches, memory loss, confusion.  Psych: Negative for anxiety, depression, suicidal ideation, hallucinations.  Endo: Negative for unusual weight change.  Heme: Negative for bruising or bleeding. Allergy:  Negative for rash or hives.    Physical Examination:  BP 84/44  Pulse 78  Temp(Src) 98.5 F (36.9 C) (Temporal)  Ht 6\' 1"  (1.854 m)  Wt 236 lb 6.4 oz (107.23 kg)  BMI 31.19 kg/m2   General:  Chronically ill-appearing WM in no acute distress.  Head: Normocephalic, atraumatic.   Eyes: Conjunctiva pink, no icterus. Mouth: Oropharyngeal mucosa moist and pink , no lesions erythema or exudate. Neck: Supple without thyromegaly, masses, or lymphadenopathy.  Lungs: Clear to auscultation bilaterally.  Heart: Regular rate and rhythm, no murmurs rubs or gallops.  Abdomen: Bowel sounds are normal, nontender, nondistended, no hepatosplenomegaly or masses, no abdominal bruits, no rebound or guarding.  Small umb hernia easily reducible.  Rectal: Defer to time of colonoscopy. Extremities: No lower extremity edema. No clubbing or deformities.  Neuro: Alert and oriented x 4 , grossly normal neurologically.  Skin: Warm and dry, no rash or jaundice.   Psych: Alert and cooperative, normal mood and affect.  Labs: Lab Results  Component Value Date   WBC 6.5 12/14/2010   HGB 9.9* 12/14/2010   HCT 29.6* 12/14/2010   MCV 87.1 12/14/2010   PLT 112* 12/14/2010   Lab Results  Component Value Date               FERRITIN 25 12/14/2010   Heme positive stool 12/14/10  Imaging Studies: No results found.

## 2011-01-11 NOTE — Assessment & Plan Note (Signed)
Continue to follow at [facility]. Has appt at end of month. Continues receive hepatoma surveillance yearly and labs every four months at [facility].

## 2011-01-11 NOTE — Assessment & Plan Note (Signed)
Worsening anemia and heme positive stool. Followed by Dr. Glenford Peers, receives parenteral iron. H/O B12 def. Needs colonoscopy and possible upper endoscopy. H/O Crohn's but no evidence of active disease, off medication for several years.   Patient request EGD to definitely be done to survey for esophageal varices. The patient's last EGD was over one year ago. No varices at that time status post TIPS.  I have discussed the risks, alternatives, benefits with regards to but not limited to the risk of reaction to medication, bleeding, infection, perforation and the patient is agreeable to proceed. Written consent to be obtained.

## 2011-01-11 NOTE — Patient Instructions (Signed)
We have scheduled you for a colonoscopy with possible upper endoscopy with Dr. Jena Gauss. Please see separate instructions.  Please follow up at [facility] as scheduled at the end of the month.

## 2011-01-11 NOTE — Progress Notes (Signed)
Cc to PCP 

## 2011-01-18 ENCOUNTER — Ambulatory Visit (INDEPENDENT_AMBULATORY_CARE_PROVIDER_SITE_OTHER): Payer: Medicare Other | Admitting: Adult Health

## 2011-01-18 ENCOUNTER — Encounter: Payer: Self-pay | Admitting: Adult Health

## 2011-01-18 DIAGNOSIS — E785 Hyperlipidemia, unspecified: Secondary | ICD-10-CM

## 2011-01-18 DIAGNOSIS — I4891 Unspecified atrial fibrillation: Secondary | ICD-10-CM

## 2011-01-18 DIAGNOSIS — I482 Chronic atrial fibrillation, unspecified: Secondary | ICD-10-CM

## 2011-01-18 MED ORDER — FUROSEMIDE 40 MG PO TABS: 40.0000 mg | ORAL_TABLET | Freq: Every day | ORAL | Status: DC

## 2011-01-18 NOTE — Patient Instructions (Signed)
**Note De-identified  Obfuscation** Your physician recommends that you continue on your current medications as directed. Please refer to the Current Medication list given to you today.  Your physician recommends that you schedule a follow-up appointment in: 1 year  

## 2011-01-18 NOTE — Assessment & Plan Note (Signed)
He is currently on a statin, despite his disease. Labs are being followed closely. Will make no changes in medications at this time.

## 2011-01-18 NOTE — Assessment & Plan Note (Signed)
He is doing well. No complaints concerning his heart rate, or other cardiac symptoms. He is asking for a refill on Lasix which is provided. He is due for lab work, but states he is to see physicians at [facility] within the next few weeks and does not want to have two sets of labs. He states they always draw a good bit of blood at his appointment.  I have requested that we receive copies of the the results when they are drawn for our records.

## 2011-01-18 NOTE — Progress Notes (Signed)
HPI: [name] is a 66 y/o patient of Dr. Dietrich Pates we are following for ongoing assessment and treatment of hypertension, hyperlipidemia, and permanent atrial fibrillation. He also has cirrhosis of the liver and is being followed by [facility] and also by Dr. Benard Rink, GI for. He is not on coumadin secondary to liver disease.He is without cardiac complaint. He is to have colonoscopy per GI in one week. He remains asymptomatic for chest pain, palpitations, or shortness of breath.  Allergies  Allergen Reactions  . Codeine Other (See Comments)    Can function - legs won't move,etc.  . Other Rash    Adhesive tape    Current Outpatient Prescriptions  Medication Sig Dispense Refill  . furosemide (LASIX) 40 MG tablet Take 1 tablet (40 mg total) by mouth daily.  30 tablet  11  . gabapentin (NEURONTIN) 300 MG capsule 300 mg at bedtime.       . insulin aspart (NOVOLOG) 100 UNIT/ML injection Inject into the skin as directed.       . insulin glargine (LANTUS) 100 UNIT/ML injection Inject 80 Units into the skin at bedtime.       Marland Kitchen lactulose (CHRONULAC) 10 GM/15ML solution Take 20 g by mouth 4 (four) times daily.        Marland Kitchen LANOXIN 0.125 MG tablet TAKE ONE TABLET DAILY.  30 each  6  . neomycin (MYCIFRADIN) 500 MG tablet Take 1,000 mg by mouth 2 (two) times daily.        . peg 3350 powder (MOVIPREP) 100 G SOLR Take 1 kit (100 g total) by mouth once. As directed Please purchase 1 Fleets enema to use with the prep  1 kit  0  . propranolol (INDERAL) 10 MG tablet Take 10 mg by mouth 2 (two) times daily.       Marland Kitchen PROTONIX 40 MG tablet TAKE (1) TABLET BY MOUTH TWICE DAILY.  60 each  3  . simvastatin (ZOCOR) 40 MG tablet Take 40 mg by mouth at bedtime.        Marland Kitchen DISCONTD: furosemide (LASIX) 40 MG tablet Take 40 mg by mouth daily.         Past Medical History  Diagnosis Date  . Hypertension     negative stress nuclear study in 02/2007; normal coronaries in 2006; Echo in 2006-aortic sclerosis, Mild biatrial  enlargement; normal LV  . Pulmonary hypertension     moderate at cath-2006  . Hyperlipidemia   . Syncope 2008    orthostatic hypotension  . Diabetes mellitus     insulin; onset in 1999; retinopathy, neuropathy and nephropathy  . Chronic atrial fibrillation     onset in 1995; anticoagulation discontinued in 2006  . Chronic kidney disease     stage III; creatinine of 1.6 in 8/07, 1.35 and 05/2008  . Crohn's disease     -lower GI bleed in 08/2002;  Pentasa stopped by Dr. Brooke Dare at [facility] ; clinical remission.  . Obesity   . NASH (nonalcoholic steatohepatitis)     cirrhosis with esophageal varices; h/o GI bleeding. EGD-2010 grade 2 esophageal varices-banded with good hemostasis; portal gastropathy, friable gastric mucosa; small hiatal hernia.  EGD during recurrent melena one week later showed 2 eschars at previously placed bands, decompressed esophageal varices grade 1-2,four columns, no bleeding stigmata; portal gastropathy, normal appearing small bowel.    . Esophageal varices     s/p band ligation multiple times, Portal Hypertensive gastropathy;  . S/P TIPS (transjugular intrahepatic portosystemic shunt)  06/2009-TIPS procedure at [facility]  . GI bleed     07/2009-admitted with recurrent upper GI bleed secondary to esophageal varices-banded x3  . Pleural effusion, left     s/p thoracentesis-multiple  . Depression   . Anemia     Iron def requiring parenteral iron or injections in past, followed by Dr. Glenford Peers; B12 def  . Duodenal ulcer 1980s  . Nephrolithiasis     cystoscopy with stone extraction in 1990  . Ascites   . Hiatal hernia 12/02/2009  . Esophageal erosions     consistent with mild erosive reflux esophagitis  . Portal hypertensive gastropathy     Past Surgical History  Procedure Date  . Tips procedure 09/16/09  . 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 Cholecystectomy   . Circumcision -1992  .  Colonoscopy -2008    Reportedly normal, done in [place]  . Multiple thoracenteses   . Esophagogastroduodenoscopy 12/02/2009    distal esophageal erosions, small hiatal hernia  . Tips procedure 2011    ZOX:WRUEAV of systems complete and found to be negative unless listed above PHYSICAL EXAM BP 110/55  Pulse 80  Ht 6\' 1"  (1.854 m)  Wt 241 lb (109.317 kg)  BMI 31.80 kg/m2  General: Well developed, well nourished, in no acute distress Head: Eyes PERRLA, No xanthomas.   Normal cephalic and atramatic  Lungs: Clear bilaterally to auscultation and percussion. Heart: HRIR S1 S2, without MRG.  Pulses are 2+ & equal.            No carotid bruit. No JVD.  No abdominal bruits. No femoral bruits. Abdomen: Bowel sounds are positive, abdomen soft and non-tender without masses or                  Hernia's noted. Msk:  Back normal, normal gait. Normal strength and tone for age. Extremities: No clubbing, cyanosis or mild nonpitting ankle eema.  DP +1 Neuro: Alert and oriented X 3. Psych:  Good affect, responds appropriately    ASSESSMENT AND PLAN

## 2011-01-19 ENCOUNTER — Encounter (HOSPITAL_COMMUNITY): Payer: Self-pay | Admitting: Pharmacy Technician

## 2011-01-25 MED ORDER — SODIUM CHLORIDE 0.45 % IV SOLN
Freq: Once | INTRAVENOUS | Status: AC
  Administered 2011-01-26: 09:00:00 via INTRAVENOUS

## 2011-01-26 ENCOUNTER — Encounter (HOSPITAL_COMMUNITY)
Admission: RE | Disposition: A | Discharge status: Home / Self Care | Payer: Self-pay | Source: Ambulatory Visit | Attending: Internal Medicine

## 2011-01-26 ENCOUNTER — Encounter (HOSPITAL_COMMUNITY): Payer: Self-pay

## 2011-01-26 ENCOUNTER — Other Ambulatory Visit: Payer: Self-pay | Admitting: Internal Medicine

## 2011-01-26 ENCOUNTER — Ambulatory Visit (HOSPITAL_COMMUNITY)
Admission: RE | Admit: 2011-01-26 | Discharge: 2011-01-26 | Disposition: A | Discharge status: Home / Self Care | Payer: Medicare Other | Source: Ambulatory Visit | Attending: Internal Medicine | Admitting: Internal Medicine

## 2011-01-26 DIAGNOSIS — D126 Benign neoplasm of colon, unspecified: Secondary | ICD-10-CM | POA: Insufficient documentation

## 2011-01-26 DIAGNOSIS — D509 Iron deficiency anemia, unspecified: Secondary | ICD-10-CM | POA: Insufficient documentation

## 2011-01-26 DIAGNOSIS — K5289 Other specified noninfective gastroenteritis and colitis: Secondary | ICD-10-CM | POA: Insufficient documentation

## 2011-01-26 DIAGNOSIS — K5090 Crohn's disease, unspecified, without complications: Secondary | ICD-10-CM

## 2011-01-26 DIAGNOSIS — I8500 Esophageal varices without bleeding: Secondary | ICD-10-CM

## 2011-01-26 DIAGNOSIS — Z794 Long term (current) use of insulin: Secondary | ICD-10-CM | POA: Insufficient documentation

## 2011-01-26 DIAGNOSIS — E119 Type 2 diabetes mellitus without complications: Secondary | ICD-10-CM | POA: Insufficient documentation

## 2011-01-26 DIAGNOSIS — I10 Essential (primary) hypertension: Secondary | ICD-10-CM | POA: Insufficient documentation

## 2011-01-26 DIAGNOSIS — Z79899 Other long term (current) drug therapy: Secondary | ICD-10-CM | POA: Insufficient documentation

## 2011-01-26 DIAGNOSIS — R195 Other fecal abnormalities: Secondary | ICD-10-CM

## 2011-01-26 DIAGNOSIS — K7581 Nonalcoholic steatohepatitis (NASH): Secondary | ICD-10-CM

## 2011-01-26 DIAGNOSIS — D649 Anemia, unspecified: Secondary | ICD-10-CM

## 2011-01-26 DIAGNOSIS — K222 Esophageal obstruction: Secondary | ICD-10-CM | POA: Insufficient documentation

## 2011-01-26 DIAGNOSIS — K921 Melena: Secondary | ICD-10-CM | POA: Insufficient documentation

## 2011-01-26 DIAGNOSIS — K449 Diaphragmatic hernia without obstruction or gangrene: Secondary | ICD-10-CM | POA: Insufficient documentation

## 2011-01-26 HISTORY — PX: ESOPHAGOGASTRODUODENOSCOPY: SHX5428

## 2011-01-26 HISTORY — PX: COLONOSCOPY: SHX5424

## 2011-01-26 SURGERY — COLONOSCOPY
Anesthesia: Moderate Sedation

## 2011-01-26 MED ORDER — STERILE WATER FOR IRRIGATION IR SOLN
Status: DC | PRN
  Administered 2011-01-26: 09:00:00

## 2011-01-26 MED ORDER — MIDAZOLAM HCL 5 MG/5ML IJ SOLN
INTRAMUSCULAR | Status: DC | PRN
  Administered 2011-01-26: 2 mg via INTRAVENOUS
  Administered 2011-01-26 (×3): 1 mg via INTRAVENOUS

## 2011-01-26 MED ORDER — MIDAZOLAM HCL 5 MG/5ML IJ SOLN
INTRAMUSCULAR | Status: AC
  Filled 2011-01-26: qty 10

## 2011-01-26 MED ORDER — MEPERIDINE HCL 100 MG/ML IJ SOLN
INTRAMUSCULAR | Status: DC | PRN
  Administered 2011-01-26 (×3): 25 mg via INTRAVENOUS

## 2011-01-26 MED ORDER — MEPERIDINE HCL 100 MG/ML IJ SOLN
INTRAMUSCULAR | Status: AC
  Filled 2011-01-26: qty 2

## 2011-01-26 MED ORDER — BUTAMBEN-TETRACAINE-BENZOCAINE 2-2-14 % EX AERO
INHALATION_SPRAY | CUTANEOUS | Status: DC | PRN
  Administered 2011-01-26: 1 via TOPICAL

## 2011-01-26 NOTE — Op Note (Signed)
[facility] [address] [place], [place]  [address]  COLONOSCOPY PROCEDURE REPORT  PATIENT:  [name], [name]  MR#:  [id number] BIRTHDATE:  07-04-1945, 65 yrs. old  GENDER:  male ENDOSCOPIST:  R. Roetta Sessions, MD FACP [facility] REF. BY:  Glenford Peers, M.D. [place] PROCEDURE DATE:  01/26/2011 PROCEDURE:  ileocolonoscopy biopsy and snare polypectomy  INDICATIONS:  Hemoccult-positive stool/  IDA  INFORMED CONSENT:  The risks, benefits, alternatives and imponderables including but not limited to bleeding, perforation as well as the possibility of a missed lesion have been reviewed. The potential for biopsy, lesion removal, etc. have also been discussed.  Questions have been answered.  All parties agreeable. Please see the history and physical in the medical record for more information.  MEDICATIONS:  Demerol 75 mg IV and Versed 5 mg IV  DESCRIPTION OF PROCEDURE:  After a digital rectal exam was performed, the EC-[id number] ([id number]) colonoscope was advanced from the anus through the rectum and colon to the area of the cecum, ileocecal valve and appendiceal orifice.  The cecum was deeply intubated.  These structures were well-seen and photographed for the record.  From the level of the cecum and ileocecal valve, the scope was slowly and cautiously withdrawn.  The mucosal surfaces were carefully surveyed utilizing scope tip deflection to facilitate fold flattening as needed.  The scope was pulled down into the rectum where a thorough examination including retroflexion was performed. <<PROCEDUREIMAGES>>  FINDINGS:  adequate preparation.  normal rectum. Somewhat diffusely friable colonic mucosa. A single 4 mm polyp in the ascending       segment. couple of mucosal breaks in the distal 10 cm of terminal ileum with  a couple of erosions at the junction of the TI                    and colon.  THERAPEUTIC / DIAGNOSTIC MANEUVERS PERFORMED:   biopsies of the terminal ileal mucosa  taken. The polyp in the ascending segment was cold snare removed  COMPLICATIONS:  none  CECAL WITHDRAWAL TIME:  12 minutes  IMPRESSION:    Colonic polyp-removed as described above. Mild inflammation in the terminal ileum as described above-status post biopsy       diffusely mildly friable colonic mucosa. Normal rectal mucosa  RECOMMENDATIONS:    Further recommendations to follow pending review of pathology report.  See EGD report  ______________________________ R. Roetta Sessions, MD Caleen Essex  CC:  Glenford Peers, M.D.  n. eSIGNED:   R. Roetta Sessions at 01/26/2011 09:47 AM  [name], [phone]

## 2011-01-26 NOTE — Op Note (Signed)
[facility] [address] [place], [place]  [address]  ENDOSCOPY PROCEDURE REPORT  PATIENT:  Willie, [name]  MR#:  [id number] BIRTHDATE:  Aug 22, 1945, 65 yrs. old  GENDER:  male  ENDOSCOPIST:  R. Roetta Sessions, MD FACP [facility] Referred by:  Karleen Hampshire, M.D. Glenford Peers, M.D.  PROCEDURE DATE:  01/26/2011 PROCEDURE:  EGD diagnostic  INDICATIONS:  recurrent anemia/iron deficiency anemia Hemoccult-positive stool but no overt bleeding  INFORMED CONSENT:   The risks, benefits, limitations, alternatives and imponderables have been discussed.  The potential for biopsy, esophogeal dilation, etc. have also been reviewed.  Questions have been answered.  All parties agreeable.  Please see the history and physical in the medical record for more information.  MEDICATIONS:     Versed 4 mg IV and Demerol 50 mg IV in divided doses. Cetacaine spray  DESCRIPTION OF PROCEDURE:   The XB-[id number] ([id number]) endoscope was introduced through the mouth and advanced to the second portion of the duodenum without difficulty or limitations.  The mucosal surfaces were surveyed very carefully during advancement of the scope and upon withdrawal.  Retroflexion view of the proximal stomach and esophagogastric junction was performed.  <<PROCEDUREIMAGES>>  FINDINGS:  No esophageal varices. Some scarring from prior banding.Non-critical  Schatzki's ring. Stomach empty. small hiatal hernia. No ulcer or infiltrating process. Intense erythema with vascular ectasia -  multiple areas in a longitudinal orientation in a circular distribution in the antrum consistent with GAVE or watermelon stomach. Pylorus patent and easily traversed. The first and second portion of the duodenum appeared normal.  THERAPEUTIC / DIAGNOSTIC MANEUVERS PERFORMED:  none  COMPLICATIONS:   None  IMPRESSION:  Decompressed / absent esophageal varices status post TIPS. Small hiatal hernia. Gastric antral vascular ectasia or  GAVE - could easily explain iron deficiency anemia via mechanism of slow GI bleeding by itself.  RECOMMENDATIONS:    See colonoscopy report.  ______________________________ R. Roetta Sessions, MD Caleen Essex  CC:  n. eSIGNED:   R. Roetta Sessions at 01/26/2011 09:16 AM  [name], [phone]

## 2011-01-26 NOTE — H&P (Signed)
  I have seen & examined the patient prior to the procedure(s) today and reviewed the history and physical/consultation.  There have been no changes.  After consideration of the risks, benefits, alternatives and imponderables, the patient has consented to the procedure(s).   

## 2011-01-31 ENCOUNTER — Encounter: Payer: Self-pay | Admitting: Internal Medicine

## 2011-02-01 ENCOUNTER — Encounter (HOSPITAL_COMMUNITY): Payer: Self-pay | Admitting: Internal Medicine

## 2011-02-12 ENCOUNTER — Other Ambulatory Visit: Payer: Self-pay | Admitting: Gastroenterology

## 2011-02-15 ENCOUNTER — Encounter (HOSPITAL_COMMUNITY): Payer: Medicare Other | Attending: Oncology

## 2011-02-15 DIAGNOSIS — D649 Anemia, unspecified: Secondary | ICD-10-CM | POA: Insufficient documentation

## 2011-02-15 LAB — CBC
HCT: 32.8 % — ABNORMAL LOW (ref 39.0–52.0)
MCH: 30.5 pg (ref 26.0–34.0)
MCV: 90.9 fL (ref 78.0–100.0)
RBC: 3.61 MIL/uL — ABNORMAL LOW (ref 4.22–5.81)
WBC: 6.3 10*3/uL (ref 4.0–10.5)

## 2011-02-15 NOTE — Progress Notes (Signed)
Labs drawn today for cbc,ferr 

## 2011-02-17 ENCOUNTER — Encounter (HOSPITAL_BASED_OUTPATIENT_CLINIC_OR_DEPARTMENT_OTHER): Payer: Medicare Other | Admitting: Oncology

## 2011-02-17 DIAGNOSIS — N183 Chronic kidney disease, stage 3 unspecified: Secondary | ICD-10-CM

## 2011-02-17 DIAGNOSIS — D649 Anemia, unspecified: Secondary | ICD-10-CM

## 2011-02-17 DIAGNOSIS — K7460 Unspecified cirrhosis of liver: Secondary | ICD-10-CM

## 2011-02-17 DIAGNOSIS — D509 Iron deficiency anemia, unspecified: Secondary | ICD-10-CM

## 2011-02-17 NOTE — Patient Instructions (Signed)
[facility]  Discharge Instructions  RECOMMENDATIONS MADE BY THE CONSULTANT AND ANY TEST RESULTS WILL BE SENT TO YOUR REFERRING DOCTOR.   You are scheduled for an iron infusion next Wednesday. We will do lab work 1 month and 3 months after the iron infusion and then have you come back and see Dr.Neijstrom.   I acknowledge that I have been informed and understand all the instructions given to me and received a copy. I do not have any more questions at this time, but understand that I may call the [facility] at [facility] at [phone] during business hours should I have any further questions or need assistance in obtaining follow-up care.    __________________________________________  _____________  __________ Signature of Patient or Authorized Representative            Date                   Time    __________________________________________ Nurse's Signature

## 2011-02-17 NOTE — Progress Notes (Signed)
Kirk Ruths, MD, MD [address] [place] [address]  1. Cirrhosis of liver without mention of alcohol    2. Anemia  CBC, Ferritin, CBC, Differential, Comprehensive metabolic panel, Ferritin    CURRENT THERAPY: Intermittent Feraheme infusion  INTERVAL HISTORY: ANTIONO ETTINGER 66 y.o. male returns for  regular  visit for followup of  Anemia multifactorial with iron deficiency and an element of anemia of chronic disease with a recent Hemoccult-positive stool, and a decrease in his ferritin from 256 June 16, 2010 to 25 on 12/14/2010.  Now 88 on 02/15/2011 following Feraheme infusion.  The patient denies any change in regard to his liver transplant status.  He reports that he has been told that he is too old for a transplant.  Otherwise, the patient is doing well.  He is on lactulose to keep his ammonia level in check.  He reports that he has about 4 BMs daily.  He tells me that if he misses his medication for 2 days or more, his mental status changes.  I validated this statement and told him that it is because his ammonia levels rise when he does not take his lactulose.   I personally reviewed and went over radiographic studies with the patient.  I personally reviewed and went over pathology results with the patient.  His pathology from the polypectomy on colonoscopy was negative for any malignancy.  The patient denies any NSAID use. I personally reviewed and went over laboratory results with the patient.  His ferritin rose from 25 on 12/14/10 to 88 on 02/15/2011 following Feraheme infusion.  We discussed his lab results.  His hemoglobin increased by one gram following the infusion to 11.0 g/dL.  We spent some time discussing the need to infusion more feraheme to increase his iron storage.  His condition will cause him to lose iron and therefore we need to get his ferritin up to a high number. We will plan to administer this medication sometime this month.  Past  Medical History  Diagnosis Date  . Hypertension     negative stress nuclear study in 02/2007; normal coronaries in 2006; Echo in 2006-aortic sclerosis, Mild biatrial enlargement; normal LV  . Pulmonary hypertension     moderate at cath-2006  . Hyperlipidemia   . Syncope 2008    orthostatic hypotension  . Diabetes mellitus     insulin; onset in 1999; retinopathy, neuropathy and nephropathy  . Chronic atrial fibrillation     onset in 1995; anticoagulation discontinued in 2006  . Chronic kidney disease     stage III; creatinine of 1.6 in 8/07, 1.35 and 05/2008  . Crohn's disease     -lower GI bleed in 08/2002;  Pentasa stopped by Dr. Brooke Dare at [facility] ; clinical remission.  . Obesity   . NASH (nonalcoholic steatohepatitis)     cirrhosis with esophageal varices; h/o GI bleeding. EGD-2010 grade 2 esophageal varices-banded with good hemostasis; portal gastropathy, friable gastric mucosa; small hiatal hernia.  EGD during recurrent melena one week later showed 2 eschars at previously placed bands, decompressed esophageal varices grade 1-2,four columns, no bleeding stigmata; portal gastropathy, normal appearing small bowel.    . Esophageal varices     s/p band ligation multiple times, Portal Hypertensive gastropathy;  . S/P TIPS (transjugular intrahepatic portosystemic shunt)     06/2009-TIPS procedure at [facility]  . GI bleed     07/2009-admitted with recurrent upper GI bleed secondary to esophageal varices-banded x3  .  Pleural effusion, left     s/p thoracentesis-multiple  . Depression   . Anemia     Iron def requiring parenteral iron or injections in past, followed by Dr. Glenford Peers; B12 def  . Duodenal ulcer 1980s  . Nephrolithiasis     cystoscopy with stone extraction in 1990  . Ascites   . Hiatal hernia 12/02/2009  . Esophageal erosions     consistent with mild erosive reflux esophagitis  . Portal hypertensive gastropathy     has HYPERLIPIDEMIA; PLEURAL EFFUSION, LEFT; UMBILICAL  HERNIA; Cirrhosis of liver without mention of alcohol; CLOSTRIDIUM DIFFICILE COLITIS, HX OF; Dysphagia; Syncope; Hypertension; Pulmonary hypertension; Diabetes mellitus; Chronic atrial fibrillation; Chronic kidney disease; Crohn's disease; NASH (nonalcoholic steatohepatitis); Esophageal varices; S/P TIPS (transjugular intrahepatic portosystemic shunt); GI bleed; Anemia; Nephrolithiasis; Epigastric pain; and Heme positive stool on his problem list.     is allergic to codeine and other.  Mr. Mcbain had no medications administered during this visit.  Past Surgical History  Procedure Date  . Tips procedure 09/16/09  . 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 Cholecystectomy   . Circumcision -1992  . Colonoscopy -2008    Reportedly normal, done in [place]  . Multiple thoracenteses   . Esophagogastroduodenoscopy 12/02/2009    distal esophageal erosions, small hiatal hernia  . Tips procedure 2011  . Colonoscopy 01/26/2011    Procedure: COLONOSCOPY;  Surgeon: Corbin Ade, MD;  Location: [facility];  Service: Endoscopy;  Laterality: N/A;  8:15  . Esophagogastroduodenoscopy 01/26/2011    Procedure: ESOPHAGOGASTRODUODENOSCOPY (EGD);  Surgeon: Corbin Ade, MD;  Location: [facility];  Service: Endoscopy;  Laterality: N/A;    Denies any headaches, dizziness, double vision, fevers, chills, night sweats, nausea, vomiting, diarrhea, constipation, chest pain, heart palpitations, shortness of breath, blood in stool, black tarry stool, urinary pain, urinary burning, urinary frequency, hematuria.   PHYSICAL EXAMINATION  ECOG PERFORMANCE STATUS: 2 - Symptomatic, <50% confined to bed  Filed Vitals:   02/17/11 1339  BP: 142/73  Pulse: 60  Temp: 97.5 F (36.4 C)    GENERAL:alert, no distress, well nourished, well developed, comfortable, cooperative and smiling SKIN: skin color, texture, turgor are normal, no rashes or  significant lesions HEAD: Normocephalic, No masses, lesions, tenderness or abnormalities EYES: normal EARS: External ears normal OROPHARYNX:mucous membranes are moist  NECK: supple, trachea midline LYMPH:  no palpable lymphadenopathy BREAST:not examined LUNGS: clear to auscultation , decreased breath sounds HEART: regular rate & rhythm, no murmurs, no gallops, S1 normal and S2 normal ABDOMEN:abdomen soft, non-tender and normal bowel sounds BACK: Back symmetric, no curvature., No CVA tenderness EXTREMITIES:less then 2 second capillary refill, no joint deformities, effusion, or inflammation, no skin discoloration, no clubbing, no cyanosis, positive findings:  edema B/L LE edema 1 + pitting  NEURO: alert & oriented x 3 with fluent speech, no focal motor/sensory deficits, gait normal   LABORATORY DATA: CBC    Component Value Date/Time   WBC 6.3 02/15/2011 1156   RBC 3.61* 02/15/2011 1156   HGB 11.0* 02/15/2011 1156   HCT 32.8* 02/15/2011 1156   PLT 105* 02/15/2011 1156   MCV 90.9 02/15/2011 1156   MCH 30.5 02/15/2011 1156   MCHC 33.5 02/15/2011 1156   RDW 16.0* 02/15/2011 1156   LYMPHSABS 0.6* 12/14/2010 1045   MONOABS 0.8 12/14/2010 1045   EOSABS 0.2 12/14/2010 1045   BASOSABS 0.0 12/14/2010 1045  Chemistry      Component Value Date/Time   NA 135 10/21/2009 0400   K 4.0 10/21/2009 0400   CL 104 10/21/2009 0400   CO2 25 10/21/2009 0400   BUN 20 10/21/2009 0400   CREATININE 1.35 10/21/2009 0400      Component Value Date/Time   CALCIUM 8.0* 10/21/2009 0400   ALKPHOS 143* 10/18/2009 0924   AST 24 10/18/2009 0924   ALT 16 10/18/2009 0924   BILITOT 1.2 10/18/2009 0924     Lab Results  Component Value Date   IRON 44 12/25/2008   TIBC 348 12/25/2008   FERRITIN 88 02/15/2011       ASSESSMENT:  1. Cirrhosis of the liver with varices.  2. Anemia multifactorial with iron deficiency and an element of anemia of chronic disease with a recent Hemoccult-positive stool, and a  decrease in his ferritin from 256 June 16, 2010 to 25 on 12/14/2010.  Now 88 on 02/15/2011 following Feraheme infusion 3. Obesity.  4. TIPS shunt for cirrhosis.  5. Hypercholesterolemia.  6. Hypertension.  7. History diabetes mellitus.  8. History of Crohn's disease.  9. History of depression which I think is stable.   PLAN:  1. Feraheme 1020 mg next Wednesday 02/24/11. 2. Supportive therapy plan built for this infusion. 3. Lab work one month later: CBC ferritin 4. Lab work in 3 months: CBC diff, CMET, Ferritin 5. I personally reviewed and went over radiographic studies with the patient. 6. I personally reviewed and went over pathology results with the patient. 7. I personally reviewed and went over laboratory results with the patient. 8. Return in 3 months for follow-up.   All questions were answered. The patient knows to call the clinic with any problems, questions or concerns. We can certainly see the patient much sooner if necessary.  The patient and plan discussed with Glenford Peers, MD and he is in agreement with the aforementioned.   Flossie Wexler

## 2011-02-23 ENCOUNTER — Other Ambulatory Visit (HOSPITAL_COMMUNITY): Payer: Self-pay | Admitting: *Deleted

## 2011-02-23 NOTE — Progress Notes (Signed)
Checking for feraheme order

## 2011-02-24 ENCOUNTER — Encounter (HOSPITAL_BASED_OUTPATIENT_CLINIC_OR_DEPARTMENT_OTHER): Payer: Medicare Other

## 2011-02-24 VITALS — BP 120/63 | HR 70 | Temp 97.9°F

## 2011-02-24 DIAGNOSIS — D649 Anemia, unspecified: Secondary | ICD-10-CM

## 2011-02-24 MED ORDER — SODIUM CHLORIDE 0.9 % IV SOLN
1020.0000 mg | Freq: Once | INTRAVENOUS | Status: AC
  Administered 2011-02-24: 1020 mg via INTRAVENOUS
  Filled 2011-02-24 (×2): qty 34

## 2011-02-24 MED ORDER — SODIUM CHLORIDE 0.9 % IJ SOLN
INTRAMUSCULAR | Status: AC
  Administered 2011-02-24: 10 mL
  Filled 2011-02-24: qty 10

## 2011-02-24 MED ORDER — SODIUM CHLORIDE 0.9 % IJ SOLN
10.0000 mL | INTRAMUSCULAR | Status: DC | PRN
  Administered 2011-02-24: 10 mL
  Filled 2011-02-24: qty 10

## 2011-02-24 MED ORDER — SODIUM CHLORIDE 0.9 % IV SOLN
Freq: Once | INTRAVENOUS | Status: AC
  Administered 2011-02-24: 12:00:00 via INTRAVENOUS

## 2011-02-24 NOTE — Progress Notes (Signed)
[name] tolerated Feraheme infusion well and without incident; verbalizes understanding for follow-up.  No distress noted at time of discharge and patient was discharged home with self.

## 2011-03-24 ENCOUNTER — Encounter (HOSPITAL_COMMUNITY): Payer: Medicare Other | Attending: Oncology

## 2011-03-24 DIAGNOSIS — D649 Anemia, unspecified: Secondary | ICD-10-CM | POA: Insufficient documentation

## 2011-03-24 LAB — CBC
Hemoglobin: 10.6 g/dL — ABNORMAL LOW (ref 13.0–17.0)
MCH: 32.5 pg (ref 26.0–34.0)
MCHC: 34.8 g/dL (ref 30.0–36.0)
MCV: 93.6 fL (ref 78.0–100.0)
RBC: 3.26 MIL/uL — ABNORMAL LOW (ref 4.22–5.81)

## 2011-03-24 NOTE — Progress Notes (Signed)
Lab draw

## 2011-04-12 ENCOUNTER — Encounter: Payer: Self-pay | Admitting: Internal Medicine

## 2011-04-13 ENCOUNTER — Ambulatory Visit (INDEPENDENT_AMBULATORY_CARE_PROVIDER_SITE_OTHER): Payer: Medicare Other | Admitting: Gastroenterology

## 2011-04-13 ENCOUNTER — Encounter: Payer: Self-pay | Admitting: Gastroenterology

## 2011-04-13 ENCOUNTER — Ambulatory Visit (HOSPITAL_COMMUNITY)
Admission: RE | Admit: 2011-04-13 | Discharge: 2011-04-13 | Disposition: A | Discharge status: Home / Self Care | Payer: Medicare Other | Source: Ambulatory Visit | Attending: Gastroenterology | Admitting: Gastroenterology

## 2011-04-13 VITALS — BP 99/56 | HR 61 | Temp 97.5°F | Ht 73.0 in | Wt 237.0 lb

## 2011-04-13 DIAGNOSIS — D509 Iron deficiency anemia, unspecified: Secondary | ICD-10-CM

## 2011-04-13 DIAGNOSIS — Z95828 Presence of other vascular implants and grafts: Secondary | ICD-10-CM

## 2011-04-13 DIAGNOSIS — Z9889 Other specified postprocedural states: Secondary | ICD-10-CM

## 2011-04-13 DIAGNOSIS — K7460 Unspecified cirrhosis of liver: Secondary | ICD-10-CM

## 2011-04-13 DIAGNOSIS — R1013 Epigastric pain: Secondary | ICD-10-CM

## 2011-04-13 DIAGNOSIS — M7989 Other specified soft tissue disorders: Secondary | ICD-10-CM | POA: Insufficient documentation

## 2011-04-13 DIAGNOSIS — K921 Melena: Secondary | ICD-10-CM

## 2011-04-13 DIAGNOSIS — D519 Vitamin B12 deficiency anemia, unspecified: Secondary | ICD-10-CM

## 2011-04-13 DIAGNOSIS — D518 Other vitamin B12 deficiency anemias: Secondary | ICD-10-CM

## 2011-04-13 DIAGNOSIS — K922 Gastrointestinal hemorrhage, unspecified: Secondary | ICD-10-CM

## 2011-04-13 DIAGNOSIS — D649 Anemia, unspecified: Secondary | ICD-10-CM

## 2011-04-13 DIAGNOSIS — R600 Localized edema: Secondary | ICD-10-CM | POA: Insufficient documentation

## 2011-04-13 DIAGNOSIS — R609 Edema, unspecified: Secondary | ICD-10-CM

## 2011-04-13 LAB — CBC WITH DIFFERENTIAL/PLATELET
Eosinophils Absolute: 0.1 10*3/uL (ref 0.0–0.7)
Eosinophils Relative: 2 % (ref 0–5)
HCT: 28.3 % — ABNORMAL LOW (ref 39.0–52.0)
Hemoglobin: 9.5 g/dL — ABNORMAL LOW (ref 13.0–17.0)
Lymphs Abs: 0.8 10*3/uL (ref 0.7–4.0)
MCH: 32.2 pg (ref 26.0–34.0)
MCV: 95.9 fL (ref 78.0–100.0)
Monocytes Absolute: 0.7 10*3/uL (ref 0.1–1.0)
Monocytes Relative: 9 % (ref 3–12)
RBC: 2.95 MIL/uL — ABNORMAL LOW (ref 4.22–5.81)

## 2011-04-13 LAB — COMPREHENSIVE METABOLIC PANEL
BUN: 20 mg/dL (ref 6–23)
CO2: 26 mEq/L (ref 19–32)
Creat: 1.46 mg/dL — ABNORMAL HIGH (ref 0.50–1.35)
Glucose, Bld: 313 mg/dL — ABNORMAL HIGH (ref 70–99)
Sodium: 136 mEq/L (ref 135–145)
Total Bilirubin: 1.3 mg/dL — ABNORMAL HIGH (ref 0.3–1.2)
Total Protein: 7.3 g/dL (ref 6.0–8.3)

## 2011-04-13 LAB — PROTIME-INR
INR: 1.3 (ref <1.50)
Prothrombin Time: 16.4 seconds — ABNORMAL HIGH (ref 11.6–15.2)

## 2011-04-13 MED ORDER — RIFAXIMIN 550 MG PO TABS: 550.0000 mg | ORAL_TABLET | Freq: Two times a day (BID) | ORAL | Status: DC

## 2011-04-13 NOTE — Progress Notes (Signed)
Faxed to PCP

## 2011-04-13 NOTE — Assessment & Plan Note (Signed)
Recheck given h/o sb crohn's.

## 2011-04-13 NOTE — Progress Notes (Signed)
Quick Note:  Pt aware ______ 

## 2011-04-13 NOTE — Assessment & Plan Note (Signed)
Reported dark stools, possibly melena. Patient with h/o EV bleeds in past. Recent EGD reassuring as far as likelihood of EV bleeding but could easily have acute on chronic bleeding from GAVE.   Check CBC STAT. If worsening symptomatic anemia ie worsening DOE, fatigue, presyncope, or persistent black stools he should go to ED in interim.

## 2011-04-13 NOTE — Progress Notes (Signed)
Primary Care Physician:  Kirk Ruths, MD, MD  Primary Gastroenterologist:  Roetta Sessions, MD   Chief Complaint  Patient presents with  . Abdominal Pain  . Melena    HPI:  [name] is a 66 y.o. male here with complaints of melena and abdominal pain. About one week ago he noted dark stools. Almost black. Darker than what he normally sees on iron supplements. Denies frank blood in the stool. No n/v, heartburn. No dysphagia. Epigastric pain intermittent. May be related to meals. Epigastric pain generally lasts for about half an hour. Does not occur daily. Maybe alittle more weak than usual. Generally does not feel well. No fever. Chronic DOE, but no worse. No chest pain. No fever. No dysuria. Lactulose 4 times per day. Causes some nausea. BM 3-4 per day. Previously tried Nurse, adult but was too expensive.   Lab Results  Component Value Date   WBC 6.7 03/24/2011   HGB 10.6* 03/24/2011   HCT 30.5* 03/24/2011   MCV 93.6 03/24/2011   PLT 93* 03/24/2011   Lab Results  Component Value Date   FERRITIN 187 03/24/2011  Ferritin got down to 25 back in 12/2010. Hgb down to 9.9. Received iron infusions since.  Last EGD 01/2011-->decompressed/abscent EV s/p TIPS, GAVE. TCS see below in [facility] for details.    Current Outpatient Prescriptions  Medication Sig Dispense Refill  . furosemide (LASIX) 40 MG tablet Take 1 tablet (40 mg total) by mouth daily.  30 tablet  11  . gabapentin (NEURONTIN) 300 MG capsule 300 mg at bedtime.       . insulin aspart (NOVOLOG) 100 UNIT/ML injection Inject into the skin as directed. Patient is on Sliding Scale, he states that most of the time he uses 40 units and this will take care of him      . insulin glargine (LANTUS) 100 UNIT/ML injection Inject 60-80 Units into the skin at bedtime.       Marland Kitchen lactulose (CHRONULAC) 10 GM/15ML solution Take 20 g by mouth 4 (four) times daily.        Marland Kitchen LANOXIN 0.125 MG tablet TAKE ONE TABLET DAILY.  30 each  6  . propranolol (INDERAL) 10  MG tablet Take 10 mg by mouth 2 (two) times daily.       Marland Kitchen PROTONIX 40 MG tablet TAKE (1) TABLET BY MOUTH TWICE DAILY.  60 each  5  . simvastatin (ZOCOR) 40 MG tablet Take 40 mg by mouth at bedtime.          Allergies as of 04/13/2011 - Review Complete 04/13/2011  Allergen Reaction Noted  . Codeine Other (See Comments)   . Other Rash 05/05/2010    Past Medical History  Diagnosis Date  . Hypertension     negative stress nuclear study in 02/2007; normal coronaries in 2006; Echo in 2006-aortic sclerosis, Mild biatrial enlargement; normal LV  . Pulmonary hypertension     moderate at cath-2006  . Hyperlipidemia   . Syncope 2008    orthostatic hypotension  . Diabetes mellitus     insulin; onset in 1999; retinopathy, neuropathy and nephropathy  . Chronic atrial fibrillation     onset in 1995; anticoagulation discontinued in 2006  . Chronic kidney disease     stage III; creatinine of 1.6 in 8/07, 1.35 and 05/2008  . Crohn's disease     -lower GI bleed in 08/2002;  Pentasa stopped by Dr. Brooke Dare at [facility] ; clinical remission.  . Obesity   . NASH (  nonalcoholic steatohepatitis)     cirrhosis with esophageal varices; h/o GI bleeding. EGD-2010 grade 2 esophageal varices-banded with good hemostasis; portal gastropathy, friable gastric mucosa; small hiatal hernia.  EGD during recurrent melena one week later showed 2 eschars at previously placed bands, decompressed esophageal varices grade 1-2,four columns, no bleeding stigmata; portal gastropathy, normal appearing small bowel.    . Esophageal varices     s/p band ligation multiple times, Portal Hypertensive gastropathy;  . S/P TIPS (transjugular intrahepatic portosystemic shunt)     06/2009-TIPS procedure at [facility]  . GI bleed     07/2009-admitted with recurrent upper GI bleed secondary to esophageal varices-banded x3  . Pleural effusion, left     s/p thoracentesis-multiple  . Depression   . Anemia     Iron def requiring parenteral iron or  injections in past, followed by Dr. Glenford Peers; B12 def  . Duodenal ulcer 1980s  . Nephrolithiasis     cystoscopy with stone extraction in 1990  . Ascites   . Hiatal hernia 12/02/2009  . Esophageal erosions     consistent with mild erosive reflux esophagitis  . Portal hypertensive gastropathy     Past Surgical History  Procedure Date  . Tips procedure 09/16/09  . 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 Cholecystectomy   . Circumcision -1992  . Colonoscopy -2008    Reportedly normal, done in [place]  . Multiple thoracenteses   . Esophagogastroduodenoscopy 12/02/2009    distal esophageal erosions, small hiatal hernia  . Tips procedure 2011  . Colonoscopy 01/26/2011    adequate preparation.  normal rectum. Somewhat  diffusely friable colonic mucosa. A single 4 mm polyp in the ascending segment. couple of mucosal breaks in the distal 10 cm of terminal ileum with  a couple of erosions at the junction  the TI  and colon.  . Esophagogastroduodenoscopy 01/26/2011    Procedure: ESOPHAGOGASTRODUODENOSCOPY (EGD);  Surgeon: Corbin Ade, MD;  Location: [facility];  Service: Endoscopy;  Laterality: N/A;    Family History  Problem Relation Age of Onset  . Colon cancer Neg Hx   . Inflammatory bowel disease Neg Hx   . Liver disease Neg Hx   . Anesthesia problems Neg Hx   . Hypotension Neg Hx   . Malignant hyperthermia Neg Hx   . Pseudochol deficiency Neg Hx     History   Social History  . Marital Status: Divorced    Spouse Name: N/A    Number of Children: 1  . Years of Education: N/A   Occupational History  . retired     disabled   Social History Main Topics  . Smoking status: Never Smoker   . Smokeless tobacco: Never Used  . Alcohol Use: No  . Drug Use: No  . Sexually Active: Yes    Birth Control/ Protection: None   Other Topics Concern  . Not on file   Social History Narrative  . No narrative  on file      ROS:  General: Negative for anorexia, weight loss, fever, chills. C/O fatigue, weakness. Eyes: Negative for vision changes.  ENT: Negative for hoarseness, difficulty swallowing , nasal congestion. CV: Negative for chest pain, angina, palpitations. C/O dyspnea on exertion, peripheral edema.  Respiratory: Negative for dyspnea at rest. C/O dyspnea on exertion. No cough, sputum, wheezing.  GI: See history of present illness. GU:  Negative for dysuria, hematuria, urinary incontinence, urinary  frequency, nocturnal urination.  MS: Negative for joint pain, low back pain.  Derm: Negative for rash or itching.  Neuro: Negative for weakness, abnormal sensation, seizure, frequent headaches, memory loss, confusion.  Psych: Negative for anxiety, depression, suicidal ideation, hallucinations.  Endo: Negative for unusual weight change.  Heme: Negative for bruising or bleeding. Allergy: Negative for rash or hives.    Physical Examination:  BP 99/56  Pulse 61  Temp(Src) 97.5 F (36.4 C) (Temporal)  Ht 6\' 1"  (1.854 m)  Wt 237 lb (107.502 kg)  BMI 31.27 kg/m2   General: Chronically ill-appearing WM ambulates slowly with cane. NAD.  Head: Normocephalic, atraumatic.   Eyes: Conjunctiva pale, no icterus. Mouth: Oropharyngeal mucosa moist and pink , no lesions erythema or exudate.  Neck: Supple without thyromegaly, masses, or lymphadenopathy.  Lungs: Clear to auscultation bilaterally.  Heart: Regular rate and rhythm, no murmurs rubs or gallops.  Abdomen: Bowel sounds are normal, nontender, nondistended, no hepatosplenomegaly or masses, no abdominal bruits or    hernia , no rebound or guarding.   Rectal: not performed Extremities: LEFT LE edema, 2+. No right LE edema. No clubbing or deformities.  Neuro: Alert and oriented x 4 , grossly normal neurologically.  Skin: Warm and dry, no rash or jaundice.   Psych: Alert and cooperative, normal mood and affect.  Labs: As above.  Imaging  Studies: No results found.

## 2011-04-13 NOTE — Assessment & Plan Note (Signed)
Ongoing intermittent epigastric pain. ?GERD vs related to lactulose vs hepatic. Check labs. Continue PPI. See if Xifaxan affordable now that indication for HE has been approved by FDA. If affordable, we can try to transition off lactulose.

## 2011-04-13 NOTE — Progress Notes (Signed)
Quick Note:  Please let pt know his hgb is 9.5. Please go to ED if persistent melena or worsening SOB, etc. I will discuss with RMR tomorrow regarding if EGD needed. ______

## 2011-04-13 NOTE — Progress Notes (Signed)
Quick Note:  Please let pt know his doppler study was normal. ______

## 2011-04-13 NOTE — Assessment & Plan Note (Signed)
Missed [facility] appt in 01/2011 due to weather and is still waiting for confirmed f/u appt from [facility]. Check AFP. If abnormal, we will check for hepatoma especially given abd pain.

## 2011-04-13 NOTE — Assessment & Plan Note (Signed)
IDA requiring iron infusions. Ferritin improved as well as hemoglobin. GAVE on EGD could explain IDA based on chronic GI bleeding.   Continue to follow up monitoring with Dr. Mariel Sleet.

## 2011-04-13 NOTE — Assessment & Plan Note (Signed)
R/O DVT

## 2011-04-14 NOTE — Progress Notes (Signed)
Quick Note:  Please find out if patient still passing black stools. If so, Dr. Jena Gauss recommends EGD with [facility] of GAVE (allow for extra time). DX GI bleed, GAVE. If pt is having melena, we need to schedule as soon as possible. ______

## 2011-04-16 NOTE — Progress Notes (Signed)
Quick Note:  He will be at risk of bleeding from GAVE. Let's go ahead and recheck his CBC on Monday to see if stable. If any drop or recurrent melena, I would definitely advise EGD with APC now (allow extra time). If melena resolved, hemoglobin stable, we could continue to monitor for now. He will likely need EGD at some point however.  Go to ED with melena, fatigue, DOE, lightheadedness.  Let me know what patient decides, at minimal needs CBC on Monday. ______

## 2011-04-16 NOTE — Progress Notes (Signed)
Quick Note:  MELD 14.  Please arrange for patient to get f/u at [facility] liver clinic. Give them MELD score when you call for appt. Please get last copy of MRI liver or abd u/s or CT liver from [facility]. ______

## 2011-04-16 NOTE — Progress Notes (Signed)
Quick Note:  Tried to call pt- LMOM ______ 

## 2011-04-19 ENCOUNTER — Other Ambulatory Visit: Payer: Self-pay | Admitting: Gastroenterology

## 2011-04-19 DIAGNOSIS — D649 Anemia, unspecified: Secondary | ICD-10-CM

## 2011-04-19 NOTE — Progress Notes (Signed)
Addended by: Jennings Books on: 04/19/2011 10:38 AM   Modules accepted: Orders

## 2011-04-19 NOTE — Progress Notes (Signed)
Quick Note:  Patient on fairly fast track for f/u at [facility]. If having dark stools again, we really need to try to get his EGD done this week. Repeat CBC today. Please follow-up with Dr. Jena Gauss when results are available as I will be off. ______

## 2011-04-19 NOTE — Progress Notes (Signed)
Quick Note:  Pt called back, he stated his stools were dark again and wants to just go ahead and set up egd as soon as possible.   Crystal, he said he would be out most of the day and you can leave appt on answering machine. ______

## 2011-04-19 NOTE — Progress Notes (Signed)
Quick Note:  Pt has an appt with Dr Claudette Laws at [facility] 05/03/2011@12 :30pm. Notes faxed to [phone]. I will mail patient a letter of this appointment. Patient scheduled for egd 04/28/2011@2 :15 pm. ______

## 2011-04-20 ENCOUNTER — Other Ambulatory Visit: Payer: Self-pay | Admitting: Gastroenterology

## 2011-04-20 LAB — CBC WITH DIFFERENTIAL/PLATELET
Basophils Absolute: 0 10*3/uL (ref 0.0–0.1)
Eosinophils Absolute: 0.2 10*3/uL (ref 0.0–0.7)
Eosinophils Relative: 2 % (ref 0–5)
Lymphocytes Relative: 12 % (ref 12–46)
MCH: 31.1 pg (ref 26.0–34.0)
MCV: 97.2 fL (ref 78.0–100.0)
Platelets: 130 10*3/uL — ABNORMAL LOW (ref 150–400)
RDW: 15 % (ref 11.5–15.5)
WBC: 6.5 10*3/uL (ref 4.0–10.5)

## 2011-04-21 ENCOUNTER — Emergency Department (HOSPITAL_COMMUNITY): Payer: Medicare Other

## 2011-04-21 ENCOUNTER — Encounter: Payer: Self-pay | Admitting: Gastroenterology

## 2011-04-21 ENCOUNTER — Inpatient Hospital Stay (HOSPITAL_COMMUNITY)
Admission: EM | Admit: 2011-04-21 | Discharge: 2011-04-24 | DRG: 378 | Disposition: A | Discharge status: Home / Self Care | Payer: Medicare Other | Attending: Internal Medicine | Admitting: Internal Medicine

## 2011-04-21 ENCOUNTER — Encounter (HOSPITAL_COMMUNITY): Payer: Self-pay | Admitting: *Deleted

## 2011-04-21 DIAGNOSIS — I10 Essential (primary) hypertension: Secondary | ICD-10-CM | POA: Diagnosis present

## 2011-04-21 DIAGNOSIS — N189 Chronic kidney disease, unspecified: Secondary | ICD-10-CM

## 2011-04-21 DIAGNOSIS — K7460 Unspecified cirrhosis of liver: Secondary | ICD-10-CM

## 2011-04-21 DIAGNOSIS — I8500 Esophageal varices without bleeding: Secondary | ICD-10-CM | POA: Diagnosis present

## 2011-04-21 DIAGNOSIS — K922 Gastrointestinal hemorrhage, unspecified: Secondary | ICD-10-CM | POA: Diagnosis present

## 2011-04-21 DIAGNOSIS — I482 Chronic atrial fibrillation, unspecified: Secondary | ICD-10-CM | POA: Diagnosis present

## 2011-04-21 DIAGNOSIS — I4891 Unspecified atrial fibrillation: Secondary | ICD-10-CM | POA: Diagnosis present

## 2011-04-21 DIAGNOSIS — E785 Hyperlipidemia, unspecified: Secondary | ICD-10-CM

## 2011-04-21 DIAGNOSIS — D519 Vitamin B12 deficiency anemia, unspecified: Secondary | ICD-10-CM | POA: Insufficient documentation

## 2011-04-21 DIAGNOSIS — R1013 Epigastric pain: Secondary | ICD-10-CM | POA: Diagnosis present

## 2011-04-21 DIAGNOSIS — Z85828 Personal history of other malignant neoplasm of skin: Secondary | ICD-10-CM

## 2011-04-21 DIAGNOSIS — F3289 Other specified depressive episodes: Secondary | ICD-10-CM | POA: Diagnosis present

## 2011-04-21 DIAGNOSIS — R600 Localized edema: Secondary | ICD-10-CM

## 2011-04-21 DIAGNOSIS — E119 Type 2 diabetes mellitus without complications: Secondary | ICD-10-CM | POA: Diagnosis present

## 2011-04-21 DIAGNOSIS — R195 Other fecal abnormalities: Secondary | ICD-10-CM

## 2011-04-21 DIAGNOSIS — Z860100 Personal history of colon polyps, unspecified: Secondary | ICD-10-CM

## 2011-04-21 DIAGNOSIS — E1149 Type 2 diabetes mellitus with other diabetic neurological complication: Secondary | ICD-10-CM | POA: Diagnosis present

## 2011-04-21 DIAGNOSIS — I2789 Other specified pulmonary heart diseases: Secondary | ICD-10-CM | POA: Diagnosis present

## 2011-04-21 DIAGNOSIS — K449 Diaphragmatic hernia without obstruction or gangrene: Secondary | ICD-10-CM | POA: Diagnosis present

## 2011-04-21 DIAGNOSIS — D649 Anemia, unspecified: Secondary | ICD-10-CM

## 2011-04-21 DIAGNOSIS — E538 Deficiency of other specified B group vitamins: Secondary | ICD-10-CM | POA: Diagnosis present

## 2011-04-21 DIAGNOSIS — E669 Obesity, unspecified: Secondary | ICD-10-CM | POA: Diagnosis present

## 2011-04-21 DIAGNOSIS — F329 Major depressive disorder, single episode, unspecified: Secondary | ICD-10-CM | POA: Diagnosis present

## 2011-04-21 DIAGNOSIS — Z8711 Personal history of peptic ulcer disease: Secondary | ICD-10-CM

## 2011-04-21 DIAGNOSIS — K429 Umbilical hernia without obstruction or gangrene: Secondary | ICD-10-CM

## 2011-04-21 DIAGNOSIS — K5090 Crohn's disease, unspecified, without complications: Secondary | ICD-10-CM | POA: Diagnosis present

## 2011-04-21 DIAGNOSIS — E1129 Type 2 diabetes mellitus with other diabetic kidney complication: Secondary | ICD-10-CM | POA: Diagnosis present

## 2011-04-21 DIAGNOSIS — Z95828 Presence of other vascular implants and grafts: Secondary | ICD-10-CM

## 2011-04-21 DIAGNOSIS — D696 Thrombocytopenia, unspecified: Secondary | ICD-10-CM | POA: Diagnosis present

## 2011-04-21 DIAGNOSIS — E1142 Type 2 diabetes mellitus with diabetic polyneuropathy: Secondary | ICD-10-CM | POA: Diagnosis present

## 2011-04-21 DIAGNOSIS — I272 Pulmonary hypertension, unspecified: Secondary | ICD-10-CM

## 2011-04-21 DIAGNOSIS — Z79899 Other long term (current) drug therapy: Secondary | ICD-10-CM

## 2011-04-21 DIAGNOSIS — E1139 Type 2 diabetes mellitus with other diabetic ophthalmic complication: Secondary | ICD-10-CM | POA: Diagnosis present

## 2011-04-21 DIAGNOSIS — K766 Portal hypertension: Secondary | ICD-10-CM | POA: Diagnosis present

## 2011-04-21 DIAGNOSIS — D509 Iron deficiency anemia, unspecified: Secondary | ICD-10-CM

## 2011-04-21 DIAGNOSIS — D500 Iron deficiency anemia secondary to blood loss (chronic): Secondary | ICD-10-CM

## 2011-04-21 DIAGNOSIS — K31811 Angiodysplasia of stomach and duodenum with bleeding: Principal | ICD-10-CM | POA: Diagnosis present

## 2011-04-21 DIAGNOSIS — Z6831 Body mass index (BMI) 31.0-31.9, adult: Secondary | ICD-10-CM

## 2011-04-21 DIAGNOSIS — K319 Disease of stomach and duodenum, unspecified: Secondary | ICD-10-CM | POA: Diagnosis present

## 2011-04-21 DIAGNOSIS — N183 Chronic kidney disease, stage 3 unspecified: Secondary | ICD-10-CM | POA: Diagnosis present

## 2011-04-21 DIAGNOSIS — Z794 Long term (current) use of insulin: Secondary | ICD-10-CM

## 2011-04-21 DIAGNOSIS — N058 Unspecified nephritic syndrome with other morphologic changes: Secondary | ICD-10-CM | POA: Diagnosis present

## 2011-04-21 DIAGNOSIS — K7689 Other specified diseases of liver: Secondary | ICD-10-CM | POA: Diagnosis present

## 2011-04-21 DIAGNOSIS — K7581 Nonalcoholic steatohepatitis (NASH): Secondary | ICD-10-CM

## 2011-04-21 DIAGNOSIS — E11319 Type 2 diabetes mellitus with unspecified diabetic retinopathy without macular edema: Secondary | ICD-10-CM | POA: Diagnosis present

## 2011-04-21 DIAGNOSIS — I129 Hypertensive chronic kidney disease with stage 1 through stage 4 chronic kidney disease, or unspecified chronic kidney disease: Secondary | ICD-10-CM | POA: Diagnosis present

## 2011-04-21 DIAGNOSIS — K921 Melena: Secondary | ICD-10-CM

## 2011-04-21 HISTORY — DX: Melena: K92.1

## 2011-04-21 LAB — COMPREHENSIVE METABOLIC PANEL
Albumin: 2.7 g/dL — ABNORMAL LOW (ref 3.5–5.2)
BUN: 19 mg/dL (ref 6–23)
Calcium: 9 mg/dL (ref 8.4–10.5)
Chloride: 100 mEq/L (ref 96–112)
Creatinine, Ser: 1.56 mg/dL — ABNORMAL HIGH (ref 0.50–1.35)
GFR calc Af Amer: 52 mL/min — ABNORMAL LOW (ref >90)
Glucose, Bld: 475 mg/dL — ABNORMAL HIGH (ref 70–99)
Potassium: 4.7 mEq/L (ref 3.5–5.1)
Sodium: 133 mEq/L — ABNORMAL LOW (ref 135–145)
Total Protein: 6.8 g/dL (ref 6.0–8.3)

## 2011-04-21 LAB — PROTIME-INR: Prothrombin Time: 17.2 seconds — ABNORMAL HIGH (ref 11.6–15.2)

## 2011-04-21 LAB — CBC
Platelets: 97 10*3/uL — ABNORMAL LOW (ref 150–400)
RBC: 2.67 MIL/uL — ABNORMAL LOW (ref 4.22–5.81)
RDW: 14.6 % (ref 11.5–15.5)
WBC: 4.6 10*3/uL (ref 4.0–10.5)

## 2011-04-21 LAB — DIFFERENTIAL
Basophils Absolute: 0 10*3/uL (ref 0.0–0.1)
Lymphocytes Relative: 15 % (ref 12–46)
Lymphs Abs: 0.7 10*3/uL (ref 0.7–4.0)
Neutro Abs: 3.3 10*3/uL (ref 1.7–7.7)

## 2011-04-21 LAB — APTT: aPTT: 36 seconds (ref 24–37)

## 2011-04-21 LAB — LIPASE, BLOOD: Lipase: 34 U/L (ref 11–59)

## 2011-04-21 LAB — OCCULT BLOOD, POC DEVICE: Fecal Occult Bld: POSITIVE

## 2011-04-21 MED ORDER — WHITE PETROLATUM GEL
1.0000 "application " | Status: DC | PRN
  Administered 2011-04-22: 1 via TOPICAL
  Filled 2011-04-21: qty 5

## 2011-04-21 MED ORDER — LACTULOSE 10 GM/15ML PO SOLN
20.0000 g | Freq: Four times a day (QID) | ORAL | Status: DC
  Administered 2011-04-22 – 2011-04-24 (×11): 20 g via ORAL
  Filled 2011-04-21 (×11): qty 30

## 2011-04-21 MED ORDER — DIGOXIN 125 MCG PO TABS
0.1250 mg | ORAL_TABLET | Freq: Every day | ORAL | Status: DC
  Administered 2011-04-22 – 2011-04-24 (×3): 0.125 mg via ORAL
  Filled 2011-04-21 (×3): qty 1

## 2011-04-21 MED ORDER — POTASSIUM CHLORIDE IN NACL 20-0.9 MEQ/L-% IV SOLN
INTRAVENOUS | Status: DC
  Administered 2011-04-22 – 2011-04-24 (×4): via INTRAVENOUS

## 2011-04-21 MED ORDER — SIMVASTATIN 20 MG PO TABS
40.0000 mg | ORAL_TABLET | Freq: Every day | ORAL | Status: DC
  Administered 2011-04-22 – 2011-04-23 (×2): 40 mg via ORAL
  Filled 2011-04-21 (×2): qty 2

## 2011-04-21 MED ORDER — INSULIN ASPART 100 UNIT/ML ~~LOC~~ SOLN
10.0000 [IU] | Freq: Once | SUBCUTANEOUS | Status: AC
  Administered 2011-04-21: 10 [IU] via SUBCUTANEOUS
  Filled 2011-04-21: qty 1

## 2011-04-21 MED ORDER — ONDANSETRON HCL 4 MG/2ML IJ SOLN
4.0000 mg | Freq: Once | INTRAMUSCULAR | Status: AC
  Administered 2011-04-21: 4 mg via INTRAVENOUS
  Filled 2011-04-21: qty 2

## 2011-04-21 MED ORDER — TRAZODONE HCL 50 MG PO TABS: 25.0000 mg | ORAL_TABLET | Freq: Every evening | ORAL | Status: DC | PRN

## 2011-04-21 MED ORDER — PROPRANOLOL HCL 10 MG PO TABS
5.0000 mg | ORAL_TABLET | Freq: Two times a day (BID) | ORAL | Status: DC
  Administered 2011-04-22 – 2011-04-24 (×6): 5 mg via ORAL
  Filled 2011-04-21 (×10): qty 0.5

## 2011-04-21 MED ORDER — PANTOPRAZOLE SODIUM 40 MG IV SOLR
40.0000 mg | Freq: Two times a day (BID) | INTRAVENOUS | Status: DC
  Administered 2011-04-22 – 2011-04-24 (×5): 40 mg via INTRAVENOUS
  Filled 2011-04-21 (×5): qty 40

## 2011-04-21 MED ORDER — ONDANSETRON HCL 4 MG PO TABS: 4.0000 mg | ORAL_TABLET | Freq: Four times a day (QID) | ORAL | Status: DC | PRN

## 2011-04-21 MED ORDER — SODIUM CHLORIDE 0.9 % IV SOLN: INTRAVENOUS | Status: DC

## 2011-04-21 MED ORDER — SODIUM CHLORIDE 0.9 % IJ SOLN
3.0000 mL | Freq: Two times a day (BID) | INTRAMUSCULAR | Status: DC
  Administered 2011-04-22 – 2011-04-24 (×4): 3 mL via INTRAVENOUS
  Filled 2011-04-21 (×4): qty 3

## 2011-04-21 MED ORDER — INSULIN ASPART 100 UNIT/ML ~~LOC~~ SOLN
0.0000 [IU] | SUBCUTANEOUS | Status: DC
  Administered 2011-04-22: 3 [IU] via SUBCUTANEOUS
  Administered 2011-04-22: 11 [IU] via SUBCUTANEOUS
  Administered 2011-04-22: 3 [IU] via SUBCUTANEOUS
  Administered 2011-04-22: 5 [IU] via SUBCUTANEOUS
  Administered 2011-04-22: 8 [IU] via SUBCUTANEOUS
  Administered 2011-04-22 – 2011-04-23 (×2): 3 [IU] via SUBCUTANEOUS
  Administered 2011-04-23 (×2): 11 [IU] via SUBCUTANEOUS
  Administered 2011-04-23: 5 [IU] via SUBCUTANEOUS
  Administered 2011-04-23: 15 [IU] via SUBCUTANEOUS
  Administered 2011-04-23: 3 [IU] via SUBCUTANEOUS
  Administered 2011-04-24: 8 [IU] via SUBCUTANEOUS
  Administered 2011-04-24 (×2): 2 [IU] via SUBCUTANEOUS
  Administered 2011-04-24: 3 [IU] via SUBCUTANEOUS

## 2011-04-21 MED ORDER — SODIUM CHLORIDE 0.9 % IV BOLUS (SEPSIS)
500.0000 mL | Freq: Once | INTRAVENOUS | Status: AC
  Administered 2011-04-21: 500 mL via INTRAVENOUS

## 2011-04-21 MED ORDER — GABAPENTIN 300 MG PO CAPS
300.0000 mg | ORAL_CAPSULE | Freq: Every day | ORAL | Status: DC
  Administered 2011-04-22 – 2011-04-23 (×2): 300 mg via ORAL
  Filled 2011-04-21 (×2): qty 1

## 2011-04-21 MED ORDER — PANTOPRAZOLE SODIUM 40 MG IV SOLR
40.0000 mg | Freq: Once | INTRAVENOUS | Status: AC
  Administered 2011-04-21: 40 mg via INTRAVENOUS
  Filled 2011-04-21: qty 40

## 2011-04-21 MED ORDER — ONDANSETRON HCL 4 MG/2ML IJ SOLN: 4.0000 mg | INTRAMUSCULAR | Status: DC | PRN

## 2011-04-21 NOTE — ED Notes (Signed)
Pt transported to floor in stable condition on monitor.

## 2011-04-21 NOTE — ED Provider Notes (Signed)
History   This chart was scribed for Donnetta Hutching, MD by Sofie Rower. The patient was seen in room APA18/APA18 and the patient's care was started at 5:57 PM     CSN: [id number]  Arrival date & time 04/21/11  1533   First MD Initiated Contact with Patient 04/21/11 1729      Chief Complaint  Patient presents with  . Abdominal Pain    (Consider location/radiation/quality/duration/timing/severity/associated sxs/prior treatment) HPI  [name] is a 66 y.o. male who presents to the Emergency Department complaining of moderate, episodic abdominal pain located epigastrically onset ten days ago with associated symptoms of diarrhea, melena (onset one month ago). The pt states "I have been visiting with Dr. Jena Gauss, GI." Pt informs EDP "he was referred to [facility] by Dr. Jena Gauss." Pt describes his stool as "pretty black, it is not tar black." Pt has a hx of ulcers.  Pt denies loss of appetite, vomiting.   PCP is Dr. Regino Schultze.  Past Medical History  Diagnosis Date  . Hypertension     negative stress nuclear study in 02/2007; normal coronaries in 2006; Echo in 2006-aortic sclerosis, Mild biatrial enlargement; normal LV  . Pulmonary hypertension     moderate at cath-2006  . Hyperlipidemia   . Syncope 2008    orthostatic hypotension  . Diabetes mellitus     insulin; onset in 1999; retinopathy, neuropathy and nephropathy  . Chronic atrial fibrillation     onset in 1995; anticoagulation discontinued in 2006  . Chronic kidney disease     stage III; creatinine of 1.6 in 8/07, 1.35 and 05/2008  . Crohn's disease     -lower GI bleed in 08/2002;  Pentasa stopped by Dr. Brooke Dare at [facility] ; clinical remission.  . Obesity   . NASH (nonalcoholic steatohepatitis)     cirrhosis with esophageal varices; h/o GI bleeding. EGD-2010 grade 2 esophageal varices-banded with good hemostasis; portal gastropathy, friable gastric mucosa; small hiatal hernia.  EGD during recurrent melena one week later showed 2 eschars at  previously placed bands, decompressed esophageal varices grade 1-2,four columns, no bleeding stigmata; portal gastropathy, normal appearing small bowel.    . Esophageal varices     s/p band ligation multiple times, Portal Hypertensive gastropathy;  . S/P TIPS (transjugular intrahepatic portosystemic shunt)     06/2009-TIPS procedure at [facility]  . GI bleed     07/2009-admitted with recurrent upper GI bleed secondary to esophageal varices-banded x3  . Pleural effusion, left     s/p thoracentesis-multiple  . Depression   . Anemia     Iron def requiring parenteral iron or injections in past, followed by Dr. Glenford Peers; B12 def  . Duodenal ulcer 1980s  . Nephrolithiasis     cystoscopy with stone extraction in 1990  . Ascites   . Hiatal hernia 12/02/2009  . Esophageal erosions     consistent with mild erosive reflux esophagitis  . Portal hypertensive gastropathy     Past Surgical History  Procedure Date  . Tips procedure 09/16/09  . 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 Cholecystectomy   . Circumcision -1992  . Colonoscopy -2008    Reportedly normal, done in [place]  . Multiple thoracenteses   . Esophagogastroduodenoscopy 12/02/2009    distal esophageal erosions, small hiatal hernia  . Tips procedure 2011  . Colonoscopy 01/26/2011    adequate preparation.  normal rectum. Somewhat  diffusely friable colonic mucosa.  A single 4 mm polyp in the ascending segment. couple of mucosal breaks in the distal 10 cm of terminal ileum with  a couple of erosions at the junction  the TI  and colon.. Tubular adenoma, chronic ileitis. Next TCS 01/2016.  Marland Kitchen Esophagogastroduodenoscopy 01/26/2011    Decompressed/abscent esophagel varices s/p TIPS. small hh. GAVE. Procedure: ESOPHAGOGASTRODUODENOSCOPY (EGD);  Surgeon: Corbin Ade, MD;  Location: [facility];  Service: Endoscopy;  Laterality: N/A;    Family History  Problem Relation Age  of Onset  . Colon cancer Neg Hx   . Inflammatory bowel disease Neg Hx   . Liver disease Neg Hx   . Anesthesia problems Neg Hx   . Hypotension Neg Hx   . Malignant hyperthermia Neg Hx   . Pseudochol deficiency Neg Hx     History  Substance Use Topics  . Smoking status: Never Smoker   . Smokeless tobacco: Never Used  . Alcohol Use: No      Review of Systems  All other systems reviewed and are negative.    10 Systems reviewed and all are negative for acute change except as noted in the HPI.    Allergies  Codeine and Other  Home Medications   Current Outpatient Rx  Name Route Sig Dispense Refill  . DIGOXIN 0.125 MG PO TABS      . FUROSEMIDE 40 MG PO TABS Oral Take 1 tablet (40 mg total) by mouth daily. 30 tablet 11  . GABAPENTIN 300 MG PO CAPS  300 mg at bedtime.     . INSULIN ASPART 100 UNIT/ML Atlantis SOLN Subcutaneous Inject into the skin 2 (two) times daily. Patient is on Sliding Scale, he states that most of the time he uses 20 units and this will take care of him    . INSULIN GLARGINE 100 UNIT/ML  SOLN Subcutaneous Inject 60-80 Units into the skin at bedtime.     Marland Kitchen LACTULOSE 10 GM/15ML PO SOLN Oral Take 20 g by mouth 4 (four) times daily.      Marland Kitchen PROPRANOLOL HCL 10 MG PO TABS Oral Take 5 mg by mouth 2 (two) times daily.     Marland Kitchen PROTONIX 40 MG PO TBEC  TAKE (1) TABLET BY MOUTH TWICE DAILY. 60 each 5  . SIMVASTATIN 40 MG PO TABS Oral Take 40 mg by mouth at bedtime.      . WHITE PETROLATUM GEL Topical Apply 1 application topically as needed.      BP 117/53  Pulse 78  Temp(Src) 98.5 F (36.9 C) (Oral)  Resp 20  Ht 6\' 1"  (1.854 m)  Wt 238 lb (107.956 kg)  BMI 31.40 kg/m2  SpO2 100%  Physical Exam  Nursing note and vitals reviewed. Constitutional: He is oriented to person, place, and time. He appears well-developed and well-nourished.  HENT:  Head: Atraumatic.  Right Ear: External ear normal.  Left Ear: External ear normal.  Nose: Nose normal.  Eyes: EOM are  normal. Pupils are equal, round, and reactive to light.  Neck: Normal range of motion. Neck supple.  Cardiovascular: Normal rate.   Pulmonary/Chest: Effort normal.  Musculoskeletal: Normal range of motion.  Neurological: He is alert and oriented to person, place, and time.  Skin: Skin is warm and dry.  Psychiatric: He has a normal mood and affect. His behavior is normal.    ED Course  Procedures (including critical care time)  DIAGNOSTIC STUDIES: Oxygen Saturation is 100% on room air, normal by my interpretation.  COORDINATION OF CARE:  Results for orders placed during the hospital encounter of 04/21/11  CBC      Component Value Range   WBC 4.6  4.0 - 10.5 (K/uL)   RBC 2.67 (*) 4.22 - 5.81 (MIL/uL)   Hemoglobin 8.5 (*) 13.0 - 17.0 (g/dL)   HCT 63.0 (*) 16.0 - 52.0 (%)   MCV 95.5  78.0 - 100.0 (fL)   MCH 31.8  26.0 - 34.0 (pg)   MCHC 33.3  30.0 - 36.0 (g/dL)   RDW 10.9  32.3 - 55.7 (%)   Platelets 97 (*) 150 - 400 (K/uL)  DIFFERENTIAL      Component Value Range   Neutrophils Relative 70  43 - 77 (%)   Neutro Abs 3.3  1.7 - 7.7 (K/uL)   Lymphocytes Relative 15  12 - 46 (%)   Lymphs Abs 0.7  0.7 - 4.0 (K/uL)   Monocytes Relative 12  3 - 12 (%)   Monocytes Absolute 0.5  0.1 - 1.0 (K/uL)   Eosinophils Relative 3  0 - 5 (%)   Eosinophils Absolute 0.1  0.0 - 0.7 (K/uL)   Basophils Relative 1  0 - 1 (%)   Basophils Absolute 0.0  0.0 - 0.1 (K/uL)  COMPREHENSIVE METABOLIC PANEL      Component Value Range   Sodium 133 (*) 135 - 145 (mEq/L)   Potassium 4.7  3.5 - 5.1 (mEq/L)   Chloride 100  96 - 112 (mEq/L)   CO2 24  19 - 32 (mEq/L)   Glucose, Bld 475 (*) 70 - 99 (mg/dL)   BUN 19  6 - 23 (mg/dL)   Creatinine, Ser 3.22 (*) 0.50 - 1.35 (mg/dL)   Calcium 9.0  8.4 - 02.5 (mg/dL)   Total Protein 6.8  6.0 - 8.3 (g/dL)   Albumin 2.7 (*) 3.5 - 5.2 (g/dL)   AST 17  0 - 37 (U/L)   ALT 12  0 - 53 (U/L)   Alkaline Phosphatase 105  39 - 117 (U/L)   Total Bilirubin 1.1  0.3 - 1.2  (mg/dL)   GFR calc non Af Amer 45 (*) >90 (mL/min)   GFR calc Af Amer 52 (*) >90 (mL/min)  LIPASE, BLOOD      Component Value Range   Lipase 34  11 - 59 (U/L)  PROTIME-INR      Component Value Range   Prothrombin Time 17.2 (*) 11.6 - 15.2 (seconds)   INR 1.38  0.00 - 1.49   APTT      Component Value Range   aPTT 36  24 - 37 (seconds)  OCCULT BLOOD, POC DEVICE      Component Value Range   Fecal Occult Bld POSITIVE       Dg Abd Acute W/chest  04/21/2011  *RADIOLOGY REPORT*  Clinical Data: Upper epigastric pain.  Evaluate for free air.  ACUTE ABDOMEN SERIES (ABDOMEN 2 VIEW & CHEST 1 VIEW)  Comparison: Acute abdominal series 05/16/2008  Findings: Stable mild cardiomegaly.  There is prominence of the central pulmonary arteries.  Left basilar consolidation and possible left pleural effusion noted.  There are no focal opacities on the right.  Harrington rods are seen in the mid to lower thoracic spine and upper lumbar spine.  A TIPS catheter projects over the right upper quadrant.  Cholecystectomy clips are noted. Bowel gas pattern is nonobstructive. No free intraperitoneal air is identified.  No evidence of ascites by plain film.  IMPRESSION:  1.  Left basilar  opacity with blunting of the left costophrenic angle. Considerations include left basilar pneumonia or aspiration versus atelectasis, with possible adjacent left pleural effusion. 2.  TIPS catheter noted in the right upper quadrant. 3.  Stable cardiomegaly without failure. 4.  Nonobstructive bowel gas pattern.  No evidence of free air.  Original Report Authenticated By: Britta Mccreedy, M.D.   CRITICAL CARE Performed by: Donnetta Hutching   Total critical care time: 30  Critical care time was exclusive of separately billable procedures and treating other patients.  Critical care was necessary to treat or prevent imminent or life-threatening deterioration.  Critical care was time spent personally by me on the following activities: development of  treatment plan with patient and/or surrogate as well as nursing, discussions with consultants, evaluation of patient's response to treatment, examination of patient, obtaining history from patient or surrogate, ordering and performing treatments and interventions, ordering and review of laboratory studies, ordering and review of radiographic studies, pulse oximetry and re-evaluation of patient's condition.   No diagnosis found.   5:59PM- EDP at bedside dscusses treatment plan concerning IV fluid and blood work.   8:47PM- Recheck. EDP at bedside discusses treatment plan.   MDM  Past medical history reviewed in detail. Patient is complicated gastrointestinal pathology. He reports weakness, black stool. Hemoglobin has dropped 1 g in a week. Rectal heme positive. Multiple rechecks. Clinical situation discussed with patient and sister. Will admit to triad hospitalist      I personally performed the services described in this documentation, which was scribed in my presence. The recorded information has been reviewed and considered.    Donnetta Hutching, MD 04/21/11 2153

## 2011-04-21 NOTE — Progress Notes (Unsigned)
Repeat CBC April 22 ----- Message ----- From: Evalee Mutton, LPN Sent: 1/61/0960 11:39 AM To: Corbin Ade, MD  Verlon Au asked me to have you follow up on this pt while she was out. His hgb is down to 9 now. Last week it was 9.5. He is scheduled for egd on 04/28/11      Pt aware of RMR recommendations, lab order faxed to lab.  Pt stated he was feeling terrible today. He said he is weak, has some SOB and black stools. Informed pt that I would alert RMR but in the meantime if he got worse he needed to find someone to take him to the ED.

## 2011-04-21 NOTE — H&P (Signed)
PCP:   Kirk Ruths, MD, MD   Chief Complaint:  Progressive weakness and black stools for 2-3 weeks.  HPI: [name] is an 66 y.o. male.   Multiple medical problems including cryptogenic liver cirrhosis, status post TIPS, hoping for liver transplant,  peptic ulcer disease, esophageal varices. Has been having black stool for the past 2-3 weeks, associated with nausea, anorexia, epigastric pain, and progressive weakness. Has been unable to get an early appointment with his hepatologist in [facility], and was advised by her local gastroenterologist to come to the emergency room for evaluation. He has gotten so weak that he finds it difficult getting around.  Patient denies use of any over-the-counter NSAIDS, but 2 weeks ago did take a course of antibiotics from his dermatologist, after cauterization of skin lesions.  His hemoglobin in February was 11, one month ago it was 9.5, today it is 8.5.  In the emergency room he reportedly had brown heme positive stool, but because of his history and his falling hemoglobin the hospitalist service was called to assist with management.  Rewiew of Systems:  The patient denies anorexia, weight loss,, vision loss, decreased hearing, hoarseness, chest pain, syncope,  balance deficits, hemoptysis, abdominal pain, melena, hematochezia, severe indigestion/heartburn, hematuria, incontinence, genital sores, muscle weakness, , transient blindness, difficulty walking, depression, unusual weight change, abnormal bleeding, enlarged lymph nodes, angioedema, and breast masses.   Past Medical History  Diagnosis Date  . Hypertension     negative stress nuclear study in 02/2007; normal coronaries in 2006; Echo in 2006-aortic sclerosis, Mild biatrial enlargement; normal LV  . Pulmonary hypertension     moderate at cath-2006  . Hyperlipidemia   . Syncope 2008    orthostatic hypotension  . Diabetes mellitus     insulin; onset in 1999; retinopathy, neuropathy and  nephropathy  . Chronic atrial fibrillation     onset in 1995; anticoagulation discontinued in 2006  . Chronic kidney disease     stage III; creatinine of 1.6 in 8/07, 1.35 and 05/2008  . Crohn's disease     -lower GI bleed in 08/2002;  Pentasa stopped by Dr. Brooke Dare at [facility] ; clinical remission.  . Obesity   . NASH (nonalcoholic steatohepatitis)     cirrhosis with esophageal varices; h/o GI bleeding. EGD-2010 grade 2 esophageal varices-banded with good hemostasis; portal gastropathy, friable gastric mucosa; small hiatal hernia.  EGD during recurrent melena one week later showed 2 eschars at previously placed bands, decompressed esophageal varices grade 1-2,four columns, no bleeding stigmata; portal gastropathy, normal appearing small bowel.    . Esophageal varices     s/p band ligation multiple times, Portal Hypertensive gastropathy;  . S/P TIPS (transjugular intrahepatic portosystemic shunt)     06/2009-TIPS procedure at [facility]  . GI bleed     07/2009-admitted with recurrent upper GI bleed secondary to esophageal varices-banded x3  . Pleural effusion, left     s/p thoracentesis-multiple  . Depression   . Anemia     Iron def requiring parenteral iron or injections in past, followed by Dr. Glenford Peers; B12 def  . Duodenal ulcer 1980s  . Nephrolithiasis     cystoscopy with stone extraction in 1990  . Ascites   . Hiatal hernia 12/02/2009  . Esophageal erosions     consistent with mild erosive reflux esophagitis  . Portal hypertensive gastropathy     Past Surgical History  Procedure Date  . Tips procedure 09/16/09  . 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 Cholecystectomy   . Circumcision -1992  . Colonoscopy -2008    Reportedly normal, done in [place]  . Multiple thoracenteses   . Esophagogastroduodenoscopy 12/02/2009    distal esophageal erosions, small hiatal hernia  . Tips procedure 2011  . Colonoscopy  01/26/2011    adequate preparation.  normal rectum. Somewhat  diffusely friable colonic mucosa. A single 4 mm polyp in the ascending segment. couple of mucosal breaks in the distal 10 cm of terminal ileum with  a couple of erosions at the junction  the TI  and colon.. Tubular adenoma, chronic ileitis. Next TCS 01/2016.  Marland Kitchen Esophagogastroduodenoscopy 01/26/2011    Decompressed/abscent esophagel varices s/p TIPS. small hh. GAVE. Procedure: ESOPHAGOGASTRODUODENOSCOPY (EGD);  Surgeon: Corbin Ade, MD;  Location: [facility];  Service: Endoscopy;  Laterality: N/A;    Medications:  HOME MEDS: Prior to Admission medications   Medication Sig Start Date End Date Taking? Authorizing Provider  digoxin (LANOXIN) 0.125 MG tablet  01/09/11  Yes Jonelle Sidle, MD  furosemide (LASIX) 40 MG tablet Take 1 tablet (40 mg total) by mouth daily. 01/18/11  Yes Jodelle Gross, NP  gabapentin (NEURONTIN) 300 MG capsule 300 mg at bedtime.  05/29/10  Yes Historical Provider, MD  insulin aspart (NOVOLOG) 100 UNIT/ML injection Inject into the skin 2 (two) times daily. Patient is on Sliding Scale, he states that most of the time he uses 20 units and this will take care of him   Yes Historical Provider, MD  insulin glargine (LANTUS) 100 UNIT/ML injection Inject 60-80 Units into the skin at bedtime.    Yes Historical Provider, MD  lactulose (CHRONULAC) 10 GM/15ML solution Take 20 g by mouth 4 (four) times daily.     Yes Historical Provider, MD  propranolol (INDERAL) 10 MG tablet Take 5 mg by mouth 2 (two) times daily.    Yes Historical Provider, MD  PROTONIX 40 MG tablet TAKE (1) TABLET BY MOUTH TWICE DAILY. 02/12/11  Yes Tiffany Kocher, PA  simvastatin (ZOCOR) 40 MG tablet Take 40 mg by mouth at bedtime.     Yes Historical Provider, MD  white petrolatum (VASELINE) GEL Apply 1 application topically as needed.   Yes Historical Provider, MD     Allergies:  Allergies  Allergen Reactions  . Codeine Other (See Comments)     Can't function - legs won't move,etc.  . Other Rash    Adhesive tape    Social History:   reports that he has never smoked. He has never used smokeless tobacco. He reports that he does not drink alcohol or use illicit drugs.  Family History: Family History  Problem Relation Age of Onset  . Colon cancer Neg Hx   . Inflammatory bowel disease Neg Hx   . Liver disease Neg Hx   . Anesthesia problems Neg Hx   . Hypotension Neg Hx   . Malignant hyperthermia Neg Hx   . Pseudochol deficiency Neg Hx      Physical Exam: Filed Vitals:   04/21/11 1617 04/21/11 1822 04/21/11 2059 04/21/11 2145  BP: 117/53 137/49 125/53   Pulse: 78 77 71 68  Temp: 98.5 F (36.9 C)  98.1 F (36.7 C)   TempSrc: Oral  Oral   Resp: 20  16 18   Height:      Weight:      SpO2: 100% 100% 99% 99%   Blood pressure 125/53, pulse 68, temperature  98.1 F (36.7 C), temperature source Oral, resp. rate 18, height 6\' 1"  (1.854 m), weight 107.956 kg (238 lb), SpO2 99.00%.  GEN:  Pleasant middle-aged Caucasian gentleman  lying in the stretcher in no acute distress, but looks weak; cooperative with exam PSYCH:  alert and oriented x4; does not appear anxious or depressed; affect is appropriate. HEENT: Mucous membranes pink and anicteric; PERRLA; EOM intact; no cervical lymphadenopathy nor thyromegaly  Breasts:: Not examined CHEST WALL: No tenderness CHEST: Normal respiration, clear to auscultation bilaterally HEART: Irregularly irregular rhythm BACK: ; no CVA tenderness ABDOMEN: Obese, soft, epigastric tenderness; umbilical hernia;no masses, no organomegaly, normal abdominal bowel sounds;  no intertriginous candida. Rectal Exam: Not done EXTREMITIES: ; age-appropriate arthropathy of the hands and knees; trace edema; multiple bruises of limbs. Genitalia: not examined PULSES: 2+ and symmetric SKIN: Healing areas in the left forearm where skin cancers have been cauterized; similar old healing area right cheek. CNS:  Cranial nerves 2-12 grossly intact no focal lateralizing neurologic deficit; coarse liver flap present.   Labs & Imaging Results for orders placed during the hospital encounter of 04/21/11 (from the past 48 hour(s))  CBC     Status: Abnormal   Collection Time   04/21/11  6:02 PM      Component Value Range Comment   WBC 4.6  4.0 - 10.5 (K/uL)    RBC 2.67 (*) 4.22 - 5.81 (MIL/uL)    Hemoglobin 8.5 (*) 13.0 - 17.0 (g/dL)    HCT 16.1 (*) 09.6 - 52.0 (%)    MCV 95.5  78.0 - 100.0 (fL)    MCH 31.8  26.0 - 34.0 (pg)    MCHC 33.3  30.0 - 36.0 (g/dL)    RDW 04.5  40.9 - 81.1 (%)    Platelets 97 (*) 150 - 400 (K/uL)   DIFFERENTIAL     Status: Normal   Collection Time   04/21/11  6:02 PM      Component Value Range Comment   Neutrophils Relative 70  43 - 77 (%)    Neutro Abs 3.3  1.7 - 7.7 (K/uL)    Lymphocytes Relative 15  12 - 46 (%)    Lymphs Abs 0.7  0.7 - 4.0 (K/uL)    Monocytes Relative 12  3 - 12 (%)    Monocytes Absolute 0.5  0.1 - 1.0 (K/uL)    Eosinophils Relative 3  0 - 5 (%)    Eosinophils Absolute 0.1  0.0 - 0.7 (K/uL)    Basophils Relative 1  0 - 1 (%)    Basophils Absolute 0.0  0.0 - 0.1 (K/uL)   COMPREHENSIVE METABOLIC PANEL     Status: Abnormal   Collection Time   04/21/11  6:02 PM      Component Value Range Comment   Sodium 133 (*) 135 - 145 (mEq/L)    Potassium 4.7  3.5 - 5.1 (mEq/L)    Chloride 100  96 - 112 (mEq/L)    CO2 24  19 - 32 (mEq/L)    Glucose, Bld 475 (*) 70 - 99 (mg/dL)    BUN 19  6 - 23 (mg/dL)    Creatinine, Ser 9.14 (*) 0.50 - 1.35 (mg/dL)    Calcium 9.0  8.4 - 10.5 (mg/dL)    Total Protein 6.8  6.0 - 8.3 (g/dL)    Albumin 2.7 (*) 3.5 - 5.2 (g/dL)    AST 17  0 - 37 (U/L)    ALT 12  0 - 53 (  U/L)    Alkaline Phosphatase 105  39 - 117 (U/L)    Total Bilirubin 1.1  0.3 - 1.2 (mg/dL)    GFR calc non Af Amer 45 (*) >90 (mL/min)    GFR calc Af Amer 52 (*) >90 (mL/min)   LIPASE, BLOOD     Status: Normal   Collection Time   04/21/11  6:02 PM       Component Value Range Comment   Lipase 34  11 - 59 (U/L)   PROTIME-INR     Status: Abnormal   Collection Time   04/21/11  6:02 PM      Component Value Range Comment   Prothrombin Time 17.2 (*) 11.6 - 15.2 (seconds)    INR 1.38  0.00 - 1.49    APTT     Status: Normal   Collection Time   04/21/11  6:02 PM      Component Value Range Comment   aPTT 36  24 - 37 (seconds)   OCCULT BLOOD, POC DEVICE     Status: Normal   Collection Time   04/21/11  8:47 PM      Component Value Range Comment   Fecal Occult Bld POSITIVE      Dg Abd Acute W/chest  04/21/2011  *RADIOLOGY REPORT*  Clinical Data: Upper epigastric pain.  Evaluate for free air.  ACUTE ABDOMEN SERIES (ABDOMEN 2 VIEW & CHEST 1 VIEW)  Comparison: Acute abdominal series 05/16/2008  Findings: Stable mild cardiomegaly.  There is prominence of the central pulmonary arteries.  Left basilar consolidation and possible left pleural effusion noted.  There are no focal opacities on the right.  Harrington rods are seen in the mid to lower thoracic spine and upper lumbar spine.  A TIPS catheter projects over the right upper quadrant.  Cholecystectomy clips are noted. Bowel gas pattern is nonobstructive. No free intraperitoneal air is identified.  No evidence of ascites by plain film.  IMPRESSION:  1.  Left basilar opacity with blunting of the left costophrenic angle. Considerations include left basilar pneumonia or aspiration versus atelectasis, with possible adjacent left pleural effusion. 2.  TIPS catheter noted in the right upper quadrant. 3.  Stable cardiomegaly without failure. 4.  Nonobstructive bowel gas pattern.  No evidence of free air.  Original Report Authenticated By: Britta Mccreedy, M.D.      Assessment Present on Admission:  .HYPERLIPIDEMIA .Upper GI bleed .Anemia, acute and chronic Esophageal varices .Cirrhosis of liver without mention of alcohol .Hypertension .Pulmonary hypertension .Diabetes mellitus .Chronic atrial  fibrillation .Chronic kidney disease  .Epigastric pain   PLAN: Since his symptoms and exam are suggestive of peptic ulcer disease, with subacute GI bleed, we'll admit this gentleman with a view to upper endoscopy, and transfuse packed red cells if his hemoglobin should fall further.  Will give her a clear liquid diet but keep npo after midnight, pending evaluation by his gastroenterologist. Will hold his Lasix for now since he seems to have dehydrated.  Other plans as per orders  Rae Sutcliffe 04/21/2011, 10:14 PM

## 2011-04-21 NOTE — Progress Notes (Unsigned)
Paged RMR.

## 2011-04-21 NOTE — Progress Notes (Unsigned)
Per RMR- pt needs to go to ED for evaluation. Pt is aware.

## 2011-04-21 NOTE — ED Notes (Signed)
Bedside toilet provided.

## 2011-04-21 NOTE — ED Notes (Signed)
Upper epigastric pain x 2 days with diarrhea.  Denies vomiting.

## 2011-04-22 ENCOUNTER — Encounter (HOSPITAL_COMMUNITY): Payer: Self-pay | Admitting: *Deleted

## 2011-04-22 ENCOUNTER — Encounter (HOSPITAL_COMMUNITY)
Admission: EM | Disposition: A | Discharge status: Home / Self Care | Payer: Self-pay | Source: Home / Self Care | Attending: Internal Medicine

## 2011-04-22 DIAGNOSIS — D696 Thrombocytopenia, unspecified: Secondary | ICD-10-CM | POA: Diagnosis present

## 2011-04-22 DIAGNOSIS — K921 Melena: Secondary | ICD-10-CM

## 2011-04-22 DIAGNOSIS — D500 Iron deficiency anemia secondary to blood loss (chronic): Secondary | ICD-10-CM | POA: Diagnosis present

## 2011-04-22 DIAGNOSIS — K31819 Angiodysplasia of stomach and duodenum without bleeding: Secondary | ICD-10-CM

## 2011-04-22 HISTORY — PX: ESOPHAGOGASTRODUODENOSCOPY: SHX5428

## 2011-04-22 LAB — COMPREHENSIVE METABOLIC PANEL
AST: 15 U/L (ref 0–37)
Alkaline Phosphatase: 100 U/L (ref 39–117)
BUN: 20 mg/dL (ref 6–23)
CO2: 24 mEq/L (ref 19–32)
Chloride: 105 mEq/L (ref 96–112)
Creatinine, Ser: 1.53 mg/dL — ABNORMAL HIGH (ref 0.50–1.35)
GFR calc non Af Amer: 46 mL/min — ABNORMAL LOW (ref >90)
Total Bilirubin: 1.1 mg/dL (ref 0.3–1.2)

## 2011-04-22 LAB — CBC
HCT: 25.5 % — ABNORMAL LOW (ref 39.0–52.0)
MCV: 95.1 fL (ref 78.0–100.0)
Platelets: 107 10*3/uL — ABNORMAL LOW (ref 150–400)
RBC: 2.68 MIL/uL — ABNORMAL LOW (ref 4.22–5.81)
WBC: 4.6 10*3/uL (ref 4.0–10.5)

## 2011-04-22 LAB — GLUCOSE, CAPILLARY
Glucose-Capillary: 213 mg/dL — ABNORMAL HIGH (ref 70–99)
Glucose-Capillary: 158 mg/dL — ABNORMAL HIGH (ref 70–99)

## 2011-04-22 LAB — DIGOXIN LEVEL: Digoxin Level: 0.6 ng/mL — ABNORMAL LOW (ref 0.8–2.0)

## 2011-04-22 LAB — PROTIME-INR: INR: 1.37 (ref 0.00–1.49)

## 2011-04-22 SURGERY — EGD (ESOPHAGOGASTRODUODENOSCOPY)
Anesthesia: Moderate Sedation

## 2011-04-22 MED ORDER — SUCRALFATE 1 GM/10ML PO SUSP
1.0000 g | Freq: Three times a day (TID) | ORAL | Status: DC
  Administered 2011-04-22 – 2011-04-24 (×8): 1 g via ORAL
  Filled 2011-04-22 (×8): qty 10

## 2011-04-22 MED ORDER — MIDAZOLAM HCL 5 MG/5ML IJ SOLN
INTRAMUSCULAR | Status: DC | PRN
  Administered 2011-04-22: 2 mg via INTRAVENOUS
  Administered 2011-04-22: 1 mg via INTRAVENOUS

## 2011-04-22 MED ORDER — SODIUM CHLORIDE 0.9 % IJ SOLN
INTRAMUSCULAR | Status: AC
  Administered 2011-04-22: 10 mL
  Filled 2011-04-22: qty 3

## 2011-04-22 MED ORDER — PNEUMOCOCCAL VAC POLYVALENT 25 MCG/0.5ML IJ INJ
0.5000 mL | INJECTION | INTRAMUSCULAR | Status: AC
  Administered 2011-04-23: 0.5 mL via INTRAMUSCULAR
  Filled 2011-04-22: qty 0.5

## 2011-04-22 MED ORDER — MEPERIDINE HCL 100 MG/ML IJ SOLN
INTRAMUSCULAR | Status: DC | PRN
  Administered 2011-04-22: 50 mg via INTRAVENOUS
  Administered 2011-04-22: 25 mg via INTRAVENOUS

## 2011-04-22 MED ORDER — BUTAMBEN-TETRACAINE-BENZOCAINE 2-2-14 % EX AERO
INHALATION_SPRAY | CUTANEOUS | Status: DC | PRN
  Administered 2011-04-22: 2 via TOPICAL

## 2011-04-22 MED ORDER — MEPERIDINE HCL 100 MG/ML IJ SOLN
INTRAMUSCULAR | Status: AC
  Filled 2011-04-22: qty 2

## 2011-04-22 MED ORDER — SODIUM CHLORIDE 0.45 % IV SOLN
Freq: Once | INTRAVENOUS | Status: AC
  Administered 2011-04-22: 15:00:00 via INTRAVENOUS

## 2011-04-22 MED ORDER — MIDAZOLAM HCL 5 MG/5ML IJ SOLN
INTRAMUSCULAR | Status: AC
  Filled 2011-04-22: qty 10

## 2011-04-22 NOTE — H&P (Signed)
Subjective: (The patient was seen earlier this morning. This is a delayed entry.) The patient has no complaints of abdominal pain, nausea, or vomiting.  Objective: Vital signs in last 24 hours: Filed Vitals:   04/22/11 1600 04/22/11 1605 04/22/11 1610 04/22/11 1657  BP: 114/49 124/53 123/64 135/79  Pulse: 71 72 75 76  Temp:    97.9 F (36.6 C)  TempSrc:    Oral  Resp: 12 13 14 18   Height:      Weight:      SpO2: 98% 98% 98% 100%    Intake/Output Summary (Last 24 hours) at 04/22/11 1744 Last data filed at 04/22/11 1145  Gross per 24 hour  Intake     10 ml  Output    850 ml  Net   -840 ml    Weight change:   Physical exam: Lungs: Clear anteriorly. Heart: S1, S2, with a soft systolic murmur. Abdomen: Positive bowel sounds, soft, mildly tender in the epigastrium and the right upper quadrant. No masses palpated, no rigidity, no distention. Extremities: Trace pedal edema bilaterally. Neurologic: He is alert and oriented x3.  Lab Results: Basic Metabolic Panel:  Basename 04/22/11 0452 04/21/11 1802  NA 137 133*  K 4.4 4.7  CL 105 100  CO2 24 24  GLUCOSE 219* 475*  BUN 20 19  CREATININE 1.53* 1.56*  CALCIUM 8.9 9.0  MG -- --  PHOS -- --   Liver Function Tests:  [facility] 04/22/11 0452 04/21/11 1802  AST 15 17  ALT 11 12  ALKPHOS 100 105  BILITOT 1.1 1.1  PROT 6.8 6.8  ALBUMIN 2.7* 2.7*    Basename 04/21/11 1802  LIPASE 34  AMYLASE --   No results found for this basename: AMMONIA:2 in the last 72 hours CBC:  Basename 04/22/11 0452 04/21/11 1802 04/20/11 1127  WBC 4.6 4.6 --  NEUTROABS -- 3.3 5.1  HGB 8.6* 8.5* --  HCT 25.5* 25.5* --  MCV 95.1 95.5 --  PLT 107* 97* --   Cardiac Enzymes: No results found for this basename: CKTOTAL:3,CKMB:3,CKMBINDEX:3,TROPONINI:3 in the last 72 hours BNP: No results found for this basename: PROBNP:3 in the last 72 hours D-Dimer: No results found for this basename: DDIMER:2 in the last 72 hours CBG:  Basename  04/22/11 1639 04/22/11 1137 04/22/11 0752 04/22/11 0403 04/22/11 0033  GLUCAP 200* 197* 158* 213* 347*   Hemoglobin A1C:  Basename 04/22/11 0003  HGBA1C 8.5*   Fasting Lipid Panel: No results found for this basename: CHOL,HDL,LDLCALC,TRIG,CHOLHDL,LDLDIRECT in the last 72 hours Thyroid Function Tests: No results found for this basename: TSH,T4TOTAL,FREET4,T3FREE,THYROIDAB in the last 72 hours Anemia Panel: No results found for this basename: VITAMINB12,FOLATE,FERRITIN,TIBC,IRON,RETICCTPCT in the last 72 hours Coagulation:  Basename 04/22/11 0452 04/21/11 1802  LABPROT 17.1* 17.2*  INR 1.37 1.38   Urine Drug Screen: Drugs of Abuse  No results found for this basename: labopia,  cocainscrnur,  labbenz,  amphetmu,  thcu,  labbarb    Alcohol Level: No results found for this basename: ETH:2 in the last 72 hours Urinalysis: No results found for this basename: COLORURINE:2,APPERANCEUR:2,LABSPEC:2,PHURINE:2,GLUCOSEU:2,HGBUR:2,BILIRUBINUR:2,KETONESUR:2,PROTEINUR:2,UROBILINOGEN:2,NITRITE:2,LEUKOCYTESUR:2 in the last 72 hours Misc. Labs:   Micro: No results found for this or any previous visit (from the past 240 hour(s)).  Studies/Results: Dg Abd Acute W/chest  04/21/2011  *RADIOLOGY REPORT*  Clinical Data: Upper epigastric pain.  Evaluate for free air.  ACUTE ABDOMEN SERIES (ABDOMEN 2 VIEW & CHEST 1 VIEW)  Comparison: Acute abdominal series 05/16/2008  Findings: Stable mild cardiomegaly.  There  is prominence of the central pulmonary arteries.  Left basilar consolidation and possible left pleural effusion noted.  There are no focal opacities on the right.  Harrington rods are seen in the mid to lower thoracic spine and upper lumbar spine.  A TIPS catheter projects over the right upper quadrant.  Cholecystectomy clips are noted. Bowel gas pattern is nonobstructive. No free intraperitoneal air is identified.  No evidence of ascites by plain film.  IMPRESSION:  1.  Left basilar opacity with  blunting of the left costophrenic angle. Considerations include left basilar pneumonia or aspiration versus atelectasis, with possible adjacent left pleural effusion. 2.  TIPS catheter noted in the right upper quadrant. 3.  Stable cardiomegaly without failure. 4.  Nonobstructive bowel gas pattern.  No evidence of free air.  Original Report Authenticated By: Britta Mccreedy, M.D.    Medications: I have reviewed the patient's current medications.  Assessment: Active Problems:  Cirrhosis of liver without mention of alcohol  Hypertension  Pulmonary hypertension  Diabetes mellitus  Chronic atrial fibrillation  Chronic kidney disease  Esophageal varices  S/P TIPS (transjugular intrahepatic portosystemic shunt)  Epigastric pain  B12 deficiency anemia  Upper GI bleed  Melena  Blood loss anemia  Thrombocytopenia  Gastroenterologist, Dr. Jena Gauss, has already seen and evaluated the patient. He has proceeded with an EGD. The results noted for GAVE. APC circular probe was applied to ablate the multiple areas of GAVE. His diet has been advanced to a full liquid diet. Carafate has been started. He continues on Protonix 40 mg IV every 12 hours. His diabetes control is not optimal. Insulin will be titrated accordingly. His heart rate and blood pressure are currently stable.  Plan:  1. Continue IV fluid hydration, IV Protonix, and Carafate. Hold Lasix today and consider restarting it in the morning. 2. Restart Lantus. 3. Continue to monitor his CBCs.   LOS: 1 day   Tate Jerkins 04/22/2011, 5:44 PM

## 2011-04-22 NOTE — Op Note (Signed)
[facility] [address] [place], [place]  [address]  ENDOSCOPY PROCEDURE REPORT  PATIENT:  [name], [name]  MR#:  [id number] BIRTHDATE:  1945/12/05, 65 yrs. old  GENDER:  male  ENDOSCOPIST:  R. Roetta Sessions, MD FACP [facility] Referred by:  Karleen Hampshire, M.D. Francina Ames, M.D. Brooke Dare, M.D.  PROCEDURE DATE:  04/22/2011 PROCEDURE:  EGD with APC GAVE  ablation  INDICATIONS:  melena in a cirrhotic with diminished hemoglobin and known GAVE  INFORMED CONSENT:   The risks, benefits, limitations, alternatives and imponderables have been discussed.  The potential for biopsy, esophogeal dilation, etc. have also been reviewed.  Questions have been answered.  All parties agreeable.  Please see the history and physical in the medical record for more information.  MEDICATIONS:  Versed 3 mg IV and Demerol 75 mg in divided doses.  DESCRIPTION OF PROCEDURE:   The EG-[id number] ([id number]) endoscope was introduced through the mouth and advanced to the second portion of the duodenum without difficulty or limitations.  The mucosal surfaces were surveyed very carefully during advancement of the scope and upon withdrawal.  Retroflexion view of the proximal stomach and esophagogastric junction was performed.  <<PROCEDUREIMAGES>>  FINDINGS:  decompressed esophagus. No varices. Scar present from prior band placement. Stomach empty. Small hiatal hernia. Minimal changes of portal gastropathy. Prominent linear rays of vascular ectasia radiating from the antrum consistent with GA VE; mucosa very friable in this area.  THERAPEUTIC / DIAGNOSTIC MANEUVERS PERFORMED:  APC circular probe on  stomach settings used to ablate multiple areas of GAVE. This was done without difficulty and without complication. Good hemostasis maintained.  COMPLICATIONS:   None  IMPRESSION:   GAVE.-status post APC ablation as described above.  RECOMMENDATIONS:   Full liquid diet. Carafate suspension. Repeat EGD one  month  ______________________________ R. Roetta Sessions, MD Caleen Essex  CC:  n. eSIGNED:   R. Roetta Sessions at 04/22/2011 03:58 PM  [name],   [phone]

## 2011-04-22 NOTE — Consult Note (Signed)
Referring Provider: Dr. Orvan Falconer  Primary Care Physician:  Kirk Ruths, MD, MD Primary Gastroenterologist:  Dr. Jena Gauss   Date of Admission: 04/21/11 Date of Consultation: 04/22/11  Reason for Consultation:  Anemia, melena, weakness  HPI:  [name] is a gentleman well known to our practice, actually seen last week in our office with reports of abdominal pain and melena. 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 Has undergone band ligation multiple times in past for esophageal varices, hx of UGI bleeds in past, IDA, B12 deficiency followed by Dr. Mariel Sleet. At appt last week reported melena, epigastric pain, labs showed Hgb down to 9.5, about a gram drop from several weeks prior. He was set up for an outpatient EGD.  However, he presented to ED yesterday with progressive weakness, epigastric pain, constant, aching. Worse with eating. Continues to report melena. Heme positive. Last BM this morning. No hematochezia. No N/V, hematemesis. +loss of appetite for about 2 weeks. No dysphagia, GERD. No jaundice, pruritis. Does report intermittent confusion. Notes 2 BMs per day.   Last EGD 01/2011-->decompressed/abscent EV s/p TIPS, GAVE. TCS see below in [facility] for details.     Past Medical History  Diagnosis Date  . Hypertension     negative stress nuclear study in 02/2007; normal coronaries in 2006; Echo in 2006-aortic sclerosis, Mild biatrial enlargement; normal LV  . Pulmonary hypertension     moderate at cath-2006  . Hyperlipidemia   . Syncope 2008    orthostatic hypotension  . Diabetes mellitus     insulin; onset in 1999; retinopathy, neuropathy and nephropathy  . Chronic atrial fibrillation     onset in 1995; anticoagulation discontinued in 2006  . Chronic kidney disease     stage III; creatinine of 1.6 in 8/07, 1.35 and 05/2008  . Crohn's disease     -lower GI bleed in 08/2002;  Pentasa stopped by Dr. Brooke Dare at [facility] ; clinical remission.  . Obesity   . NASH  (nonalcoholic steatohepatitis)     cirrhosis with esophageal varices; h/o GI bleeding. EGD-2010 grade 2 esophageal varices-banded with good hemostasis; portal gastropathy, friable gastric mucosa; small hiatal hernia.  EGD during recurrent melena one week later showed 2 eschars at previously placed bands, decompressed esophageal varices grade 1-2,four columns, no bleeding stigmata; portal gastropathy, normal appearing small bowel.    . Esophageal varices     s/p band ligation multiple times, Portal Hypertensive gastropathy;  . S/P TIPS (transjugular intrahepatic portosystemic shunt)     06/2009-TIPS procedure at [facility]  . GI bleed     07/2009-admitted with recurrent upper GI bleed secondary to esophageal varices-banded x3  . Pleural effusion, left     s/p thoracentesis-multiple  . Depression   . Anemia     Iron def requiring parenteral iron or injections in past, followed by Dr. Glenford Peers; B12 def  . Duodenal ulcer 1980s  . Nephrolithiasis     cystoscopy with stone extraction in 1990  . Ascites   . Hiatal hernia 12/02/2009  . Esophageal erosions     consistent with mild erosive reflux esophagitis  . Portal hypertensive gastropathy     Past Surgical History  Procedure Date  . Tips procedure 09/16/09  . 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 Cholecystectomy   . Circumcision -1992  . Colonoscopy -2008    Reportedly normal, done in [place]  . Multiple thoracenteses   .  Esophagogastroduodenoscopy 12/02/2009    distal esophageal erosions, small hiatal hernia  . Tips procedure 2011  . Colonoscopy 01/26/2011    adequate preparation.  normal rectum. Somewhat  diffusely friable colonic mucosa. A single 4 mm polyp in the ascending segment. couple of mucosal breaks in the distal 10 cm of terminal ileum with  a couple of erosions at the junction  the TI  and colon.. Tubular adenoma, chronic ileitis. Next TCS 01/2016.  Marland Kitchen  Esophagogastroduodenoscopy 01/26/2011    Decompressed/abscent esophagel varices s/p TIPS. small hh. GAVE. Procedure: ESOPHAGOGASTRODUODENOSCOPY (EGD);  Surgeon: Corbin Ade, MD;  Location: [facility];  Service: Endoscopy;  Laterality: N/A;    Prior to Admission medications   Medication Sig Start Date End Date Taking? Authorizing Provider  digoxin (LANOXIN) 0.125 MG tablet  01/09/11  Yes Jonelle Sidle, MD  furosemide (LASIX) 40 MG tablet Take 1 tablet (40 mg total) by mouth daily. 01/18/11  Yes Jodelle Gross, NP  gabapentin (NEURONTIN) 300 MG capsule 300 mg at bedtime.  05/29/10  Yes Historical Provider, MD  insulin aspart (NOVOLOG) 100 UNIT/ML injection Inject into the skin 2 (two) times daily. Patient is on Sliding Scale, he states that most of the time he uses 20 units and this will take care of him   Yes Historical Provider, MD  insulin glargine (LANTUS) 100 UNIT/ML injection Inject 60-80 Units into the skin at bedtime.    Yes Historical Provider, MD  lactulose (CHRONULAC) 10 GM/15ML solution Take 20 g by mouth 4 (four) times daily.     Yes Historical Provider, MD  propranolol (INDERAL) 10 MG tablet Take 5 mg by mouth 2 (two) times daily.    Yes Historical Provider, MD  PROTONIX 40 MG tablet TAKE (1) TABLET BY MOUTH TWICE DAILY. 02/12/11  Yes Tiffany Kocher, PA  simvastatin (ZOCOR) 40 MG tablet Take 40 mg by mouth at bedtime.     Yes Historical Provider, MD  white petrolatum (VASELINE) GEL Apply 1 application topically as needed.   Yes Historical Provider, MD    Current Facility-Administered Medications  Medication Dose Route Frequency Provider Last Rate Last Dose  . 0.9 % NaCl with KCl 20 mEq/ L  infusion   Intravenous Continuous Vania Rea, MD 75 mL/hr at 04/22/11 0032    . digoxin (LANOXIN) tablet 0.125 mg  0.125 mg Oral Daily Vania Rea, MD      . gabapentin (NEURONTIN) capsule 300 mg  300 mg Oral QHS Vania Rea, MD      . insulin aspart (novoLOG) injection  0-15 Units  0-15 Units Subcutaneous Q4H Vania Rea, MD   5 Units at 04/22/11 0413  . insulin aspart (novoLOG) injection 10 Units  10 Units Subcutaneous Once Vania Rea, MD   10 Units at 04/21/11 2312  . lactulose (CHRONULAC) 10 GM/15ML solution 20 g  20 g Oral Q6H Vania Rea, MD   20 g at 04/22/11 [id number]  . ondansetron (ZOFRAN) injection 4 mg  4 mg Intravenous Once Donnetta Hutching, MD   4 mg at 04/21/11 1811  . ondansetron (ZOFRAN) tablet 4 mg  4 mg Oral Q6H PRN Vania Rea, MD       Or  . ondansetron [facility]) injection 4 mg  4 mg Intravenous Q4H PRN Vania Rea, MD      . pantoprazole (PROTONIX) injection 40 mg  40 mg Intravenous Once Donnetta Hutching, MD   40 mg at 04/21/11 1812  . pantoprazole (PROTONIX) injection 40 mg  40 mg  Intravenous Q12H Vania Rea, MD      . pneumococcal 23 valent vaccine (PNU-IMMUNE) injection 0.5 mL  0.5 mL Intramuscular Tomorrow-1000 Vania Rea, MD      . propranolol (INDERAL) tablet 5 mg  5 mg Oral BID Vania Rea, MD   5 mg at 04/22/11 0109  . simvastatin (ZOCOR) tablet 40 mg  40 mg Oral QHS Vania Rea, MD      . sodium chloride 0.9 % bolus 500 mL  500 mL Intravenous Once Donnetta Hutching, MD   500 mL at 04/21/11 1811  . sodium chloride 0.9 % injection 3 mL  3 mL Intravenous Q12H Vania Rea, MD      . traZODone (DESYREL) tablet 25 mg  25 mg Oral QHS PRN Vania Rea, MD      . white petrolatum (VASELINE) gel 1 application  1 application Topical PRN Vania Rea, MD   1 application at 04/22/11 0040  . DISCONTD: 0.9 %  sodium chloride infusion   Intravenous Continuous Donnetta Hutching, MD        Allergies as of 04/21/2011 - Review Complete 04/21/2011  Allergen Reaction Noted  . Codeine Other (See Comments)   . Other Rash 05/05/2010    Family History  Problem Relation Age of Onset  . Colon cancer Neg Hx   . Inflammatory bowel disease Neg Hx   . Liver disease Neg Hx   . Anesthesia problems Neg Hx   . Hypotension Neg Hx    . Malignant hyperthermia Neg Hx   . Pseudochol deficiency Neg Hx     History   Social History  . Marital Status: Divorced    Spouse Name: N/A    Number of Children: 1  . Years of Education: N/A   Occupational History  . retired     disabled   Social History Main Topics  . Smoking status: Never Smoker   . Smokeless tobacco: Never Used  . Alcohol Use: No  . Drug Use: No  . Sexually Active: Yes    Birth Control/ Protection: None   Other Topics Concern  . Not on file   Social History Narrative  . No narrative on file    Review of Systems: Gen: SEE HPI CV: Denies chest pain, heart palpitations, syncope, edema  Resp: Denies shortness of breath with rest, cough, wheezing GI: Denies dysphagia or odynophagia. Denies vomiting blood, jaundice, and fecal incontinence.  GU : Denies urinary burning, urinary frequency, urinary incontinence.  MS: Denies joint pain,swelling, cramping Derm: Denies rash, itching, dry skin Psych: + depression   Physical Exam: Vital signs in last 24 hours: Temp:  [97.8 F (36.6 C)-98.5 F (36.9 C)] 98 F (36.7 C) (04/18 0540) Pulse Rate:  [68-79] 77  (04/18 0540) Resp:  [16-20] 20  (04/18 0540) BP: (117-147)/(49-71) 136/70 mmHg (04/18 0540) SpO2:  [99 %-100 %] 100 % (04/18 0540) Weight:  [237 lb (107.502 kg)-238 lb (107.956 kg)] 237 lb (107.502 kg) (04/17 2355) Last BM Date: 04/21/11 General:   Alert,  Flat affect, states "tired of this" Head:  Normocephalic and atraumatic. Eyes:  Sclera clear, no icterus.   Conjunctiva pink. Ears:  Normal auditory acuity. Nose:  No deformity, discharge,  or lesions. Mouth:  No deformity or lesions Neck:  Supple; no masses or thyromegaly. Lungs:  Clear throughout to auscultation.   No wheezes, crackles, or rhonchi. No acute distress. Heart:  S1 S2 present; no murmurs, clicks, rubs,  or gallops. Abdomen:  Soft, nontender and nondistended. + BS,  large umbilical hernia noted.  Rectal:  Deferred  Msk:   Symmetrical without gross deformities. Normal posture. Extremities:  1+ edema bilateral lower extremities Neurologic:  Alert and  oriented x4;  grossly normal neurologically. Skin:  Right cheek with scab, s/p skin ca excision per pt Cervical Nodes:  No significant cervical adenopathy. Psych:  Alert and cooperative. Flat affect  Intake/Output from previous day: 04/17 0701 - 04/18 0700 In: -  Out: 500 [Urine:500] Intake/Output this shift:    Lab Results:  Basename 04/22/11 0452 04/21/11 1802 04/20/11 1127  WBC 4.6 4.6 6.5  HGB 8.6* 8.5* 9.0*  HCT 25.5* 25.5* 28.1*  PLT 107* 97* 130*   BMET  Basename 04/22/11 0452 04/21/11 1802  NA 137 133*  K 4.4 4.7  CL 105 100  CO2 24 24  GLUCOSE 219* 475*  BUN 20 19  CREATININE 1.53* 1.56*  CALCIUM 8.9 9.0   LFT  Basename 04/22/11 0452 04/21/11 1802  PROT 6.8 6.8  ALBUMIN 2.7* 2.7*  AST 15 17  ALT 11 12  ALKPHOS 100 105  BILITOT 1.1 1.1  BILIDIR -- --  IBILI -- --   PT/INR  Basename 04/22/11 0452 04/21/11 1802  LABPROT 17.1* 17.2*  INR 1.37 1.38    Studies/Results: Dg Abd Acute W/chest  04/21/2011  *RADIOLOGY REPORT*  Clinical Data: Upper epigastric pain.  Evaluate for free air.  ACUTE ABDOMEN SERIES (ABDOMEN 2 VIEW & CHEST 1 VIEW)  Comparison: Acute abdominal series 05/16/2008  Findings: Stable mild cardiomegaly.  There is prominence of the central pulmonary arteries.  Left basilar consolidation and possible left pleural effusion noted.  There are no focal opacities on the right.  Harrington rods are seen in the mid to lower thoracic spine and upper lumbar spine.  A TIPS catheter projects over the right upper quadrant.  Cholecystectomy clips are noted. Bowel gas pattern is nonobstructive. No free intraperitoneal air is identified.  No evidence of ascites by plain film.  IMPRESSION:  1.  Left basilar opacity with blunting of the left costophrenic angle. Considerations include left basilar pneumonia or aspiration versus  atelectasis, with possible adjacent left pleural effusion. 2.  TIPS catheter noted in the right upper quadrant. 3.  Stable cardiomegaly without failure. 4.  Nonobstructive bowel gas pattern.  No evidence of free air.  Original Report Authenticated By: Britta Mccreedy, M.D.    Impression: 66 year old male with hx of NASH cirrhosis, s/p TIPS in past, history of EV bleeds in past with Jan 2013 EGD showing GAVE and decompressed varices. Seen in office last week with melena, epigastric pain, set up for outpatient EGD. Presented to ED yesterday with progressive weakness, melena, heme positive stools, Hgb drifting down, and continued epigastric pain. No hematemesis. Likely source of bleed from known GAVE. Will proceed with EGD as inpatient for further evaluation.   Plan: Remain NPO Continue PPI Monitor Hgb, transfuse as necessary EGD with Dr. Jena Gauss likely today.  Discussed plan with pt. He is fully aware and willing to proceed with EGD.    LOS: 1 day   Gerrit Halls  04/22/2011, 7:48 AM  Patient seen and examined. Agree with plan.The risks, benefits, limitations, alternatives and imponderables have been reviewed with the patient. Potential for esophageal dilation, biopsy, etc. have also been reviewed.  Questions have been answered. All parties agreeable.

## 2011-04-22 NOTE — Progress Notes (Signed)
Inpatient Diabetes Program Recommendations  AACE/ADA: New Consensus Statement on Inpatient Glycemic Control  Target Ranges:  Prepandial:   less than 140 mg/dL      Peak postprandial:   less than 180 mg/dL (1-2 hours)      Critically ill patients:  140 - 180 mg/dL  Pager:  [phone] Hours:  8 am-10pm   Reason for Visit: Elevated glucose and on large dose of Lantus at home  Inpatient Diabetes Program Recommendations Insulin - Basal: Add partial dose of home Lantus: Consider starting with 30 units qhs and titrate as needed

## 2011-04-23 ENCOUNTER — Telehealth: Payer: Self-pay | Admitting: Gastroenterology

## 2011-04-23 DIAGNOSIS — K7460 Unspecified cirrhosis of liver: Secondary | ICD-10-CM

## 2011-04-23 DIAGNOSIS — K922 Gastrointestinal hemorrhage, unspecified: Secondary | ICD-10-CM

## 2011-04-23 LAB — GLUCOSE, CAPILLARY
Glucose-Capillary: 195 mg/dL — ABNORMAL HIGH (ref 70–99)
Glucose-Capillary: 171 mg/dL — ABNORMAL HIGH (ref 70–99)
Glucose-Capillary: 305 mg/dL — ABNORMAL HIGH (ref 70–99)
Glucose-Capillary: 353 mg/dL — ABNORMAL HIGH (ref 70–99)

## 2011-04-23 LAB — BASIC METABOLIC PANEL
BUN: 17 mg/dL (ref 6–23)
Chloride: 108 mEq/L (ref 96–112)
GFR calc Af Amer: 56 mL/min — ABNORMAL LOW (ref >90)
Glucose, Bld: 212 mg/dL — ABNORMAL HIGH (ref 70–99)
Potassium: 4.4 mEq/L (ref 3.5–5.1)
Sodium: 137 mEq/L (ref 135–145)

## 2011-04-23 LAB — HEMOGLOBIN AND HEMATOCRIT, BLOOD
HCT: 25.4 % — ABNORMAL LOW (ref 39.0–52.0)
Hemoglobin: 8.3 g/dL — ABNORMAL LOW (ref 13.0–17.0)

## 2011-04-23 LAB — CBC
HCT: 22.3 % — ABNORMAL LOW (ref 39.0–52.0)
Hemoglobin: 7.4 g/dL — ABNORMAL LOW (ref 13.0–17.0)
RBC: 2.32 MIL/uL — ABNORMAL LOW (ref 4.22–5.81)
WBC: 3.5 10*3/uL — ABNORMAL LOW (ref 4.0–10.5)

## 2011-04-23 LAB — HEMOGLOBIN A1C
Hgb A1c MFr Bld: 8.4 % — ABNORMAL HIGH (ref <5.7)
Mean Plasma Glucose: 194 mg/dL — ABNORMAL HIGH (ref <117)

## 2011-04-23 MED ORDER — DIPHENHYDRAMINE HCL 25 MG PO CAPS
25.0000 mg | ORAL_CAPSULE | Freq: Once | ORAL | Status: AC
  Administered 2011-04-23: 25 mg via ORAL
  Filled 2011-04-23: qty 1

## 2011-04-23 MED ORDER — ACETAMINOPHEN 325 MG PO TABS
650.0000 mg | ORAL_TABLET | Freq: Once | ORAL | Status: AC
  Administered 2011-04-23: 650 mg via ORAL
  Filled 2011-04-23: qty 2

## 2011-04-23 MED ORDER — FUROSEMIDE 20 MG PO TABS
20.0000 mg | ORAL_TABLET | Freq: Two times a day (BID) | ORAL | Status: DC
  Administered 2011-04-24: 20 mg via ORAL
  Filled 2011-04-23: qty 1

## 2011-04-23 MED ORDER — FUROSEMIDE 10 MG/ML IJ SOLN
20.0000 mg | Freq: Once | INTRAMUSCULAR | Status: AC
  Administered 2011-04-23: 20 mg via INTRAVENOUS
  Filled 2011-04-23: qty 2

## 2011-04-23 NOTE — Progress Notes (Signed)
REVIEWED. AGREE. 

## 2011-04-23 NOTE — Telephone Encounter (Signed)
Forward to Crystal 

## 2011-04-23 NOTE — Progress Notes (Signed)
Subjective:  Feels better. Stools dark today but lighter than yesterday. No n/v. No abd pain. C/O lightheadedness.   Objective: Vital signs in last 24 hours: Temp:  [97.7 F (36.5 C)-98.3 F (36.8 C)] 98.3 F (36.8 C) (04/19 0442) Pulse Rate:  [70-78] 72  (04/19 0442) Resp:  [11-20] 20  (04/19 0442) BP: (102-135)/(41-79) 104/57 mmHg (04/19 0442) SpO2:  [98 %-100 %] 98 % (04/19 0442) Last BM Date: 04/22/11 General:   Alert,  Pale, chronically ill-appearing WM in NAD Head:  Normocephalic and atraumatic. Eyes:  Sclera clear, no icterus.  Chest: CTA bilaterally without rales, rhonchi, crackles.    Heart:  Regular rate, irregular rhythm; no murmurs, clicks, rubs,  or gallops. Abdomen:  Soft, nontender and nondistended.  Normal bowel sounds, without guarding, and without rebound.   Extremities:  Without clubbing, deformity or edema. Neurologic:  Alert and  oriented x4;  grossly normal neurologically. Skin:  Intact without significant lesions or rashes. Psych:  Alert and cooperative. Normal mood and affect.  Intake/Output from previous day: 04/18 0701 - 04/19 0700 In: 2218.9 [P.O.:10; I.V.:2208.9] Out: 352 [Urine:350; Stool:2] Intake/Output this shift:    Lab Results: CBC  Basename 04/23/11 0441 04/22/11 0452 04/21/11 1802  WBC 3.5* 4.6 4.6  HGB 7.4* 8.6* 8.5*  HCT 22.3* 25.5* 25.5*  MCV 96.1 95.1 95.5  PLT 82* 107* 97*   BMET  Basename 04/23/11 0441 04/22/11 0452 04/21/11 1802  NA 137 137 133*  K 4.4 4.4 4.7  CL 108 105 100  CO2 22 24 24   GLUCOSE 212* 219* 475*  BUN 17 20 19   CREATININE 1.48* 1.53* 1.56*  CALCIUM 8.5 8.9 9.0   LFTs  Basename 04/22/11 0452 04/21/11 1802  BILITOT 1.1 1.1  BILIDIR -- --  IBILI -- --  ALKPHOS 100 105  AST 15 17  ALT 11 12  PROT 6.8 6.8  ALBUMIN 2.7* 2.7*    Basename 04/21/11 1802  LIPASE 34   PT/INR  Basename 04/22/11 0452 04/21/11 1802  LABPROT 17.1* 17.2*  INR 1.37 1.38      Imaging Studies:   Dg Abd Acute  W/chest  04/21/2011  *RADIOLOGY REPORT*  Clinical Data: Upper epigastric pain.  Evaluate for free air.  ACUTE ABDOMEN SERIES (ABDOMEN 2 VIEW & CHEST 1 VIEW)  Comparison: Acute abdominal series 05/16/2008  Findings: Stable mild cardiomegaly.  There is prominence of the central pulmonary arteries.  Left basilar consolidation and possible left pleural effusion noted.  There are no focal opacities on the right.  Harrington rods are seen in the mid to lower thoracic spine and upper lumbar spine.  A TIPS catheter projects over the right upper quadrant.  Cholecystectomy clips are noted. Bowel gas pattern is nonobstructive. No free intraperitoneal air is identified.  No evidence of ascites by plain film.  IMPRESSION:  1.  Left basilar opacity with blunting of the left costophrenic angle. Considerations include left basilar pneumonia or aspiration versus atelectasis, with possible adjacent left pleural effusion. 2.  TIPS catheter noted in the right upper quadrant. 3.  Stable cardiomegaly without failure. 4.  Nonobstructive bowel gas pattern.  No evidence of free air.  Original Report Authenticated By: Britta Mccreedy, M.D.  [2 weeks]   Assessment: 66 y/o gentleman with NASH cirrhosis, Crohn's, chronic IDA/B12 deficiency who presented with stuttering melena and drop of H/H from baseline. Known GAVE from prior EGD but patient developed symptomatic anemia while awaiting outpatient EGD. EGD--?GAVE s/p APC ablation. No varices present (s/p TIPS previously).  Hgb down some this AM. Discussed with Dr. Sherrie Mustache. Transfuse one unit prbcs. Patient denies melena today.  NASH cirrhosis. MELD 14. Has appt at [facility] liver clinic 05/03/11 at 12:30pm. Patient aware.  Plan: 1. EGD in one month. 2. One unit prbcs. Order done. 3. Keep appt at [facility] liver clinic, MELD continues to rise.   LOS: 2 days   Tana Coast  04/23/2011, 9:08 AM

## 2011-04-23 NOTE — H&P (Signed)
Subjective: The patient says that he feels little bit better. He had a dark but not black stool overnight. He denies nausea or vomiting. Overall, he still feels weak.  Objective: Vital signs in last 24 hours: Filed Vitals:   04/22/11 1920 04/22/11 2117 04/23/11 0442 04/23/11 1036  BP:  103/55 104/57 125/71  Pulse:  70 72 68  Temp:  98 F (36.7 C) 98.3 F (36.8 C) 98 F (36.7 C)  TempSrc:  Oral Oral Oral  Resp:  20 20 20   Height:      Weight:      SpO2: 99% 99% 98% 99%    Intake/Output Summary (Last 24 hours) at 04/23/11 1049 Last data filed at 04/23/11 0937  Gross per 24 hour  Intake 2658.92 ml  Output    252 ml  Net 2406.92 ml    Weight change:   Physical exam: Gen.: 66 year old Caucasian man in no acute, but he looks chronically debilitated.  Lungs: Clear anteriorly, decreased breath sounds in the bases. Heart: S1, S2, with a soft systolic murmur. Abdomen: Positive bowel sounds, soft, nontender, nondistended.  Extremities: Trace pedal edema bilaterally. Neurologic: He is alert and oriented x3.  Lab Results: Basic Metabolic Panel:  Basename 04/23/11 0441 04/22/11 0452  NA 137 137  K 4.4 4.4  CL 108 105  CO2 22 24  GLUCOSE 212* 219*  BUN 17 20  CREATININE 1.48* 1.53*  CALCIUM 8.5 8.9  MG -- --  PHOS -- --   Liver Function Tests:  Basename 04/22/11 0452 04/21/11 1802  AST 15 17  ALT 11 12  ALKPHOS 100 105  BILITOT 1.1 1.1  PROT 6.8 6.8  ALBUMIN 2.7* 2.7*    Basename 04/21/11 1802  LIPASE 34  AMYLASE --    Basename 04/23/11 0441  AMMONIA 80*   CBC:  Basename 04/23/11 0441 04/22/11 0452 04/21/11 1802 04/20/11 1127  WBC 3.5* 4.6 -- --  NEUTROABS -- -- 3.3 5.1  HGB 7.4* 8.6* -- --  HCT 22.3* 25.5* -- --  MCV 96.1 95.1 -- --  PLT 82* 107* -- --   Cardiac Enzymes: No results found for this basename: CKTOTAL:3,CKMB:3,CKMBINDEX:3,TROPONINI:3 in the last 72 hours BNP: No results found for this basename: PROBNP:3 in the last 72  hours D-Dimer: No results found for this basename: DDIMER:2 in the last 72 hours CBG:  Basename 04/23/11 0727 04/23/11 0425 04/23/11 0003 04/22/11 1928 04/22/11 1639 04/22/11 1137  GLUCAP 171* 195* 235* 300* 200* 197*   Hemoglobin A1C:  Basename 04/22/11 0003  HGBA1C 8.5*   Fasting Lipid Panel: No results found for this basename: CHOL,HDL,LDLCALC,TRIG,CHOLHDL,LDLDIRECT in the last 72 hours Thyroid Function Tests: No results found for this basename: TSH,T4TOTAL,FREET4,T3FREE,THYROIDAB in the last 72 hours Anemia Panel: No results found for this basename: VITAMINB12,FOLATE,FERRITIN,TIBC,IRON,RETICCTPCT in the last 72 hours Coagulation:  Basename 04/22/11 0452 04/21/11 1802  LABPROT 17.1* 17.2*  INR 1.37 1.38   Urine Drug Screen: Drugs of Abuse  No results found for this basename: labopia,  cocainscrnur,  labbenz,  amphetmu,  thcu,  labbarb    Alcohol Level: No results found for this basename: ETH:2 in the last 72 hours Urinalysis: No results found for this basename: COLORURINE:2,APPERANCEUR:2,LABSPEC:2,PHURINE:2,GLUCOSEU:2,HGBUR:2,BILIRUBINUR:2,KETONESUR:2,PROTEINUR:2,UROBILINOGEN:2,NITRITE:2,LEUKOCYTESUR:2 in the last 72 hours Misc. Labs:   Micro: No results found for this or any previous visit (from the past 240 hour(s)).  Studies/Results: Dg Abd Acute W/chest  04/21/2011  *RADIOLOGY REPORT*  Clinical Data: Upper epigastric pain.  Evaluate for free air.  ACUTE ABDOMEN SERIES (ABDOMEN 2  VIEW & CHEST 1 VIEW)  Comparison: Acute abdominal series 05/16/2008  Findings: Stable mild cardiomegaly.  There is prominence of the central pulmonary arteries.  Left basilar consolidation and possible left pleural effusion noted.  There are no focal opacities on the right.  Harrington rods are seen in the mid to lower thoracic spine and upper lumbar spine.  A TIPS catheter projects over the right upper quadrant.  Cholecystectomy clips are noted. Bowel gas pattern is nonobstructive. No free  intraperitoneal air is identified.  No evidence of ascites by plain film.  IMPRESSION:  1.  Left basilar opacity with blunting of the left costophrenic angle. Considerations include left basilar pneumonia or aspiration versus atelectasis, with possible adjacent left pleural effusion. 2.  TIPS catheter noted in the right upper quadrant. 3.  Stable cardiomegaly without failure. 4.  Nonobstructive bowel gas pattern.  No evidence of free air.  Original Report Authenticated By: Britta Mccreedy, M.D.    Medications: I have reviewed the patient's current medications.  Assessment: Active Problems:  Cirrhosis of liver without mention of alcohol  Hypertension  Pulmonary hypertension  Diabetes mellitus  Chronic atrial fibrillation  Chronic kidney disease  Esophageal varices  S/P TIPS (transjugular intrahepatic portosystemic shunt)  Epigastric pain  B12 deficiency anemia  Upper GI bleed  Melena  Blood loss anemia  Thrombocytopenia  The patient is status post EGD which revealed  GAVE.; status post APC circular probe ablation on 04/22/2011.Marland Kitchen He is on Carafate and Protonix. His hemoglobin has fallen over the past 2 days, secondary to acute on chronic GI bleeding. This warrants transfusion today.  His diabetes control is not optimal. Insulin will be titrated accordingly. His heart rate and blood pressure are currently stable. His glycemic control is not optimal. The registered dietitian/diabetes coordinator recommendations are noted and appreciated.   Plan:  1. Will restart Lantus. 2. We'll transfuse 1 unit of packed blood cells as ordered by GI. 3. Will restart Lasix and give following the transfusion. 4. Continue to monitor his hemoglobin and hematocrit. 5. Out of bed to the chair as tolerated.    LOS: 2 days   Jaeley Wiker 04/23/2011, 10:49 AM

## 2011-04-23 NOTE — Telephone Encounter (Signed)
Patient needs EGD with APC in one month with Dr. Jena Gauss. Cancel EGD for next week.

## 2011-04-23 NOTE — Progress Notes (Signed)
   CARE MANAGEMENT NOTE 04/23/2011  Patient:  [name], [name]   Account Number:  [id number]  Date Initiated:  04/23/2011  Documentation initiated by:  Rosemary Holms  Subjective/Objective Assessment:   Pt admitted from home where he lives with his sister and brother.. Admitted with cirrosis, melena and anemia.     Action/Plan:   Spoke to pt at bedside. No HH DME needs identified   Anticipated DC Date:  04/25/2011   Anticipated DC Plan:  HOME/SELF CARE      DC Planning Services  CM consult      Choice offered to / List presented to:             Status of service:  In process, will continue to follow Medicare Important Message given?   (If response is "NO", the following Medicare IM given date fields will be blank) Date Medicare IM given:   Date Additional Medicare IM given:    Discharge Disposition:    Per UR Regulation:    If discussed at Long Length of Stay Meetings, dates discussed:    Comments:  04/23/11 1000 Alese Furniss Leanord Hawking RN BSN CM

## 2011-04-24 ENCOUNTER — Encounter (HOSPITAL_COMMUNITY): Payer: Self-pay | Admitting: Internal Medicine

## 2011-04-24 DIAGNOSIS — K31819 Angiodysplasia of stomach and duodenum without bleeding: Secondary | ICD-10-CM

## 2011-04-24 DIAGNOSIS — K922 Gastrointestinal hemorrhage, unspecified: Secondary | ICD-10-CM

## 2011-04-24 LAB — CBC
MCH: 31.5 pg (ref 26.0–34.0)
MCHC: 33.5 g/dL (ref 30.0–36.0)
MCV: 94.2 fL (ref 78.0–100.0)
Platelets: 74 10*3/uL — ABNORMAL LOW (ref 150–400)
RDW: 16.1 % — ABNORMAL HIGH (ref 11.5–15.5)

## 2011-04-24 LAB — BASIC METABOLIC PANEL
BUN: 14 mg/dL (ref 6–23)
Calcium: 8.3 mg/dL — ABNORMAL LOW (ref 8.4–10.5)
Creatinine, Ser: 1.48 mg/dL — ABNORMAL HIGH (ref 0.50–1.35)
GFR calc non Af Amer: 48 mL/min — ABNORMAL LOW (ref >90)
Glucose, Bld: 155 mg/dL — ABNORMAL HIGH (ref 70–99)
Sodium: 137 mEq/L (ref 135–145)

## 2011-04-24 LAB — GLUCOSE, CAPILLARY
Glucose-Capillary: 198 mg/dL — ABNORMAL HIGH (ref 70–99)
Glucose-Capillary: 262 mg/dL — ABNORMAL HIGH (ref 70–99)

## 2011-04-24 MED ORDER — SUCRALFATE 1 GM/10ML PO SUSP: 1.0000 g | Freq: Three times a day (TID) | ORAL | Status: DC

## 2011-04-24 MED ORDER — SODIUM CHLORIDE 0.9 % IJ SOLN
INTRAMUSCULAR | Status: AC
  Filled 2011-04-24: qty 3

## 2011-04-24 NOTE — Progress Notes (Signed)
Discharge Summary: a/o.vss. Saline lock removed. Telemetry d/c. Prescriptions given. Discharge instructions given. Pt verbalized understanding of instructions. Awaiting for family member to arrive to transport home.

## 2011-04-24 NOTE — Progress Notes (Signed)
Subjective: The patient says that he feels better and wants to know if he is going home today. He denies abdominal pain, nausea, vomiting, or any black stools overnight.  Objective: Vital signs in last 24 hours: Filed Vitals:   04/23/11 1759 04/23/11 2134 04/24/11 0153 04/24/11 0603  BP: 135/72 120/56 112/63 107/60  Pulse: 63 63 68 67  Temp: 97.6 F (36.4 C) 98.2 F (36.8 C) 98.6 F (37 C) 97.5 F (36.4 C)  TempSrc: Oral Oral Oral Oral  Resp: 20 20 20 20   Height:      Weight:    107.5 kg (236 lb 15.9 oz)  SpO2: 100% 99% 99% 99%    Intake/Output Summary (Last 24 hours) at 04/24/11 0912 Last data filed at 04/24/11 0600  Gross per 24 hour  Intake   3900 ml  Output      0 ml  Net   3900 ml    Weight change:   Physical exam: Gen.: 66 year old Caucasian man in no acute, but he looks less debilitated today.  Lungs: Clear anteriorly, decreased breath sounds in the bases. Heart: S1, S2, with a soft systolic murmur. Abdomen: Positive bowel sounds, soft, nontender, nondistended.  Extremities: Trace pedal edema bilaterally. Neurologic: He is alert and oriented x3.  Lab Results: Basic Metabolic Panel:  Basename 04/24/11 0530 04/23/11 0441  NA 137 137  K 4.4 4.4  CL 108 108  CO2 22 22  GLUCOSE 155* 212*  BUN 14 17  CREATININE 1.48* 1.48*  CALCIUM 8.3* 8.5  MG -- --  PHOS -- --   Liver Function Tests:  Basename 04/22/11 0452 04/21/11 1802  AST 15 17  ALT 11 12  ALKPHOS 100 105  BILITOT 1.1 1.1  PROT 6.8 6.8  ALBUMIN 2.7* 2.7*    Basename 04/21/11 1802  LIPASE 34  AMYLASE --    Basename 04/23/11 0441  AMMONIA 80*   CBC:  Basename 04/24/11 0530 04/23/11 1750 04/23/11 0441 04/21/11 1802  WBC 4.2 -- 3.5* --  NEUTROABS -- -- -- 3.3  HGB 8.2* 8.3* -- --  HCT 24.5* 25.4* -- --  MCV 94.2 -- 96.1 --  PLT 74* -- 82* --   Cardiac Enzymes: No results found for this basename: CKTOTAL:3,CKMB:3,CKMBINDEX:3,TROPONINI:3 in the last 72 hours BNP: No results found  for this basename: PROBNP:3 in the last 72 hours D-Dimer: No results found for this basename: DDIMER:2 in the last 72 hours CBG:  Basename 04/24/11 0741 04/24/11 0404 04/23/11 2350 04/23/11 2010 04/23/11 1632 04/23/11 1116  GLUCAP 136* 198* 121* 353* 305* 311*   Hemoglobin A1C:  Basename 04/23/11 0441  HGBA1C 8.4*   Fasting Lipid Panel: No results found for this basename: CHOL,HDL,LDLCALC,TRIG,CHOLHDL,LDLDIRECT in the last 72 hours Thyroid Function Tests: No results found for this basename: TSH,T4TOTAL,FREET4,T3FREE,THYROIDAB in the last 72 hours Anemia Panel: No results found for this basename: VITAMINB12,FOLATE,FERRITIN,TIBC,IRON,RETICCTPCT in the last 72 hours Coagulation:  Basename 04/22/11 0452 04/21/11 1802  LABPROT 17.1* 17.2*  INR 1.37 1.38   Urine Drug Screen: Drugs of Abuse  No results found for this basename: labopia,  cocainscrnur,  labbenz,  amphetmu,  thcu,  labbarb    Alcohol Level: No results found for this basename: ETH:2 in the last 72 hours Urinalysis: No results found for this basename: COLORURINE:2,APPERANCEUR:2,LABSPEC:2,PHURINE:2,GLUCOSEU:2,HGBUR:2,BILIRUBINUR:2,KETONESUR:2,PROTEINUR:2,UROBILINOGEN:2,NITRITE:2,LEUKOCYTESUR:2 in the last 72 hours Misc. Labs:   Micro: No results found for this or any previous visit (from the past 240 hour(s)).  Studies/Results: No results found.  Medications: I have reviewed the patient's current  medications.  Assessment: Active Problems:  Cirrhosis of liver without mention of alcohol  Hypertension  Pulmonary hypertension  Diabetes mellitus  Chronic atrial fibrillation  Chronic kidney disease  Esophageal varices  S/P TIPS (transjugular intrahepatic portosystemic shunt)  Epigastric pain  B12 deficiency anemia  Upper GI bleed  Melena  Blood loss anemia  Thrombocytopenia  The patient is status post EGD which revealed  GAVE.; status post APC circular probe ablation on 04/22/2011.Marland Kitchen He is on Carafate and  Protonix. He was transfused one unit of packed blood cells yesterday when his hemoglobin fell to 7.4. It has been stable since transfusion. 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 His diabetes control is better with the restart of Lantus.  His heart rate and blood pressure are currently stable. His ammonia level is modestly elevated but there are no signs of hepatic encephalopathy. He will continue on lactulose every 6 hours.   Plan:  1. Last the nursing staff to ambulate the patient today. 2. Advancement of diet per GI. 3. Will discharge to home today if okay with GI. It is obvious the patient will need close followup.   LOS: 3 days   [name] 04/24/2011, 9:12 AM

## 2011-04-24 NOTE — Progress Notes (Signed)
Subjective: Since I last evaluated the patient-no vomiting,  Or black tarry stools. Having liquid stools on lactulose. 4-5 BMS YESTERDAY. No abdominal pain. USES LACTULOSE 4X/DAY AT HOME.  Objective: Vital signs in last 24 hours: Temp:  [97.3 F (36.3 C)-98.6 F (37 C)] 98.2 F (36.8 C) (04/20 1012) Pulse Rate:  [60-75] 75  (04/20 1012) Resp:  [20] 20  (04/20 1012) BP: (103-135)/(56-72) 122/72 mmHg (04/20 1012) SpO2:  [98 %-100 %] 98 % (04/20 1012) Weight:  [236 lb 15.9 oz (107.5 kg)] 236 lb 15.9 oz (107.5 kg) (04/20 0603) Last BM Date: 04/23/11  Intake/Output from previous day: 04/19 0701 - 04/20 0700 In: 3900 [P.O.:1650; I.V.:1925; Blood:325] Out: -  Intake/Output this shift: Total I/O In: 240 [P.O.:240] Out: -   General appearance: alert, cooperative and appears stated age GI: soft, non-tender; bowel sounds normal; obese  Lab Results:  Basename 04/24/11 0530 04/23/11 1750 04/23/11 0441 04/22/11 0452  WBC 4.2 -- 3.5* 4.6  HGB 8.2* 8.3* 7.4* --  HCT 24.5* 25.4* 22.3* --  PLT 74* -- 82* 107*   BMET  Basename 04/24/11 0530 04/23/11 0441 04/22/11 0452  NA 137 137 137  K 4.4 4.4 4.4  CL 108 108 105  CO2 22 22 24   GLUCOSE 155* 212* 219*  BUN 14 17 20   CREATININE 1.48* 1.48* 1.53*  CALCIUM 8.3* 8.5 8.9   LFT  Basename 04/22/11 0452  PROT 6.8  ALBUMIN 2.7*  AST 15  ALT 11  ALKPHOS 100  BILITOT 1.1  BILIDIR --  IBILI --   PT/INR  Basename 04/22/11 0452 04/21/11 1802  LABPROT 17.1* 17.2*  INR 1.37 1.38   Hepatitis Panel No results found for this basename: HEPBSAG,HCVAB,HEPAIGM,HEPBIGM in the last 72 hours C-Diff No results found for this basename: CDIFFTOX:3 in the last 72 hours Fecal Lactopherrin No results found for this basename: FECLLACTOFRN in the last 72 hours  Studies/Results: No results found.  Medications: I have reviewed the patient's current medications.  Assessment/Plan: UGIB due to gave, s/p APC  PLAN: 1. ADVANCE DIET. 2. D/C TO  HOME 3. REPEAT EGD IN 1 MONTH.   LOS: 3 days   Hether Anselmo 04/24/2011, 12:52 PM

## 2011-04-24 NOTE — Discharge Summary (Signed)
Physician Discharge Summary  BRYDON SPAHR MRN: [id number] DOB/AGE: 06-Jan-1945 66 y.o.  PCP: Kirk Ruths, MD, MD   Admit date: 04/21/2011 Discharge date: 04/24/2011  Discharge Diagnoses:  1. 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 Jena Gauss.  Status post APC ablation. 2. Acute on chronic blood loss anemia. He also has vitamin B12 deficiency anemia. Status post 1 unit of packed red blood cell transfusion. The patient's hemoglobin was 8.2 at the time of discharge. 3. NASH cirrhosis. 4. Status post TIPS (transjugular intrahepatic portal systemic shunt) in the past. 5. Esophageal varices and portal gastropathy. 6. Chronic atrial fibrillation, no longer on Coumadin. 7. Type 2 diabetes mellitus. Hemoglobin A1c was 8.5. Further management will be deferred to his primary care physician for tighter control. 8. 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 The patient's platelet count was 74 at the time of discharge. 9. Elevated ammonia level at 80 with no signs of hepatic encephalopathy. 10. Hypertension. 11. Pulmonary hypertension. 12. Stage III chronic kidney disease. The patient's creatinine was 1.48 at the time of discharge.    Medication List  As of 04/24/2011  1:36 PM   TAKE these medications         furosemide 40 MG tablet   Commonly known as: LASIX   Take 1 tablet (40 mg total) by mouth daily.      gabapentin 300 MG capsule   Commonly known as: NEURONTIN   300 mg at bedtime.      lactulose 10 GM/15ML solution   Commonly known as: CHRONULAC   Take 20 g by mouth 4 (four) times daily.      LANOXIN 0.125 MG tablet   Generic drug: digoxin      LANTUS 100 UNIT/ML injection   Generic drug: insulin glargine   Inject 60-80 Units into the skin at bedtime.      NOVOLOG 100 UNIT/ML injection   Generic drug: insulin aspart   Inject into the skin 2 (two) times daily. Patient is on Sliding Scale, he states  that most of the time he uses 20 units and this will take care of him      propranolol 10 MG tablet   Commonly known as: INDERAL   Take 5 mg by mouth 2 (two) times daily.      PROTONIX 40 MG tablet   Generic drug: pantoprazole   TAKE (1) TABLET BY MOUTH TWICE DAILY.      simvastatin 40 MG tablet   Commonly known as: ZOCOR   Take 40 mg by mouth at bedtime.      sucralfate 1 GM/10ML suspension   Commonly known as: CARAFATE   Take 10 mLs (1 g total) by mouth 4 (four) times daily -  with meals and at bedtime.      white petrolatum Gel   Commonly known as: VASELINE   Apply 1 application topically as needed.            Discharge Condition: Improved and stable.  Disposition: 01-Home or Self Care   Consults: Gastroenterologist, Dr. Benard Rink and Dr. Darrick Penna.  Significant Diagnostic Studies: US Venous Img Lower Unilateral Left  04/13/2011  *RADIOLOGY REPORT*  Clinical Data: History of left leg pain swelling  LEFT LOWER EXTREMITY VENOUS DUPLEX ULTRASOUND  Technique:  Gray-scale sonography with graded compression, as well as color Doppler and duplex ultrasound were performed to evaluate the deep venous system of the lower extremity from the level of the  common femoral vein through the popliteal and proximal calf veins. Spectral Doppler was utilized to evaluate flow at rest and with distal augmentation maneuvers.  Comparison:  None.  Findings:  Normal compressibility of the common femoral, superficial femoral, and popliteal veins is demonstrated, as well as the visualized proximal calf veins.  No filling defects to suggest DVT on grayscale or color Doppler imaging.  Doppler waveforms show normal direction of venous flow, normal respiratory phasicity and response to augmentation.  IMPRESSION: No evidence of left leg deep venous thrombosis.  Original Report Authenticated By: Crawford Givens, M.D.   Dg Abd Acute W/chest  04/21/2011  *RADIOLOGY REPORT*  Clinical Data: Upper epigastric pain.  Evaluate for  free air.  ACUTE ABDOMEN SERIES (ABDOMEN 2 VIEW & CHEST 1 VIEW)  Comparison: Acute abdominal series 05/16/2008  Findings: Stable mild cardiomegaly.  There is prominence of the central pulmonary arteries.  Left basilar consolidation and possible left pleural effusion noted.  There are no focal opacities on the right.  Harrington rods are seen in the mid to lower thoracic spine and upper lumbar spine.  A TIPS catheter projects over the right upper quadrant.  Cholecystectomy clips are noted. Bowel gas pattern is nonobstructive. No free intraperitoneal air is identified.  No evidence of ascites by plain film.  IMPRESSION:  1.  Left basilar opacity with blunting of the left costophrenic angle. Considerations include left basilar pneumonia or aspiration versus atelectasis, with possible adjacent left pleural effusion. 2.  TIPS catheter noted in the right upper quadrant. 3.  Stable cardiomegaly without failure. 4.  Nonobstructive bowel gas pattern.  No evidence of free air.  Original Report Authenticated By: Britta Mccreedy, M.D.     Microbiology: No results found for this or any previous visit (from the past 240 hour(s)).   Labs: Results for orders placed during the hospital encounter of 04/21/11 (from the past 48 hour(s))  GLUCOSE, CAPILLARY     Status: Abnormal   Collection Time   04/22/11  4:39 PM      Component Value Range Comment   Glucose-Capillary 200 (*) 70 - 99 (mg/dL)    Comment 1 Documented in Chart      Comment 2 Notify RN     GLUCOSE, CAPILLARY     Status: Abnormal   Collection Time   04/22/11  7:28 PM      Component Value Range Comment   Glucose-Capillary 300 (*) 70 - 99 (mg/dL)    Comment 1 Documented in Chart      Comment 2 Notify RN     GLUCOSE, CAPILLARY     Status: Abnormal   Collection Time   04/23/11 12:03 AM      Component Value Range Comment   Glucose-Capillary 235 (*) 70 - 99 (mg/dL)    Comment 1 Documented in Chart      Comment 2 Notify RN     GLUCOSE, CAPILLARY     Status:  Abnormal   Collection Time   04/23/11  4:25 AM      Component Value Range Comment   Glucose-Capillary 195 (*) 70 - 99 (mg/dL)    Comment 1 Documented in Chart      Comment 2 Notify RN     CBC     Status: Abnormal   Collection Time   04/23/11  4:41 AM      Component Value Range Comment   WBC 3.5 (*) 4.0 - 10.5 (K/uL)    RBC 2.32 (*) 4.22 - 5.81 (MIL/uL)  Hemoglobin 7.4 (*) 13.0 - 17.0 (g/dL)    HCT 27.2 (*) 53.6 - 52.0 (%)    MCV 96.1  78.0 - 100.0 (fL)    MCH 31.9  26.0 - 34.0 (pg)    MCHC 33.2  30.0 - 36.0 (g/dL)    RDW 64.4  03.4 - 74.2 (%)    Platelets 82 (*) 150 - 400 (K/uL) DELTA CHECK NOTED  BASIC METABOLIC PANEL     Status: Abnormal   Collection Time   04/23/11  4:41 AM      Component Value Range Comment   Sodium 137  135 - 145 (mEq/L)    Potassium 4.4  3.5 - 5.1 (mEq/L)    Chloride 108  96 - 112 (mEq/L)    CO2 22  19 - 32 (mEq/L)    Glucose, Bld 212 (*) 70 - 99 (mg/dL)    BUN 17  6 - 23 (mg/dL)    Creatinine, Ser 5.95 (*) 0.50 - 1.35 (mg/dL)    Calcium 8.5  8.4 - 10.5 (mg/dL)    GFR calc non Af Amer 48 (*) >90 (mL/min)    GFR calc Af Amer 56 (*) >90 (mL/min)   AMMONIA     Status: Abnormal   Collection Time   04/23/11  4:41 AM      Component Value Range Comment   Ammonia 80 (*) 11 - 60 (umol/L)   HEMOGLOBIN A1C     Status: Abnormal   Collection Time   04/23/11  4:41 AM      Component Value Range Comment   Hemoglobin A1C 8.4 (*) <5.7 (%)    Mean Plasma Glucose 194 (*) <117 (mg/dL)   GLUCOSE, CAPILLARY     Status: Abnormal   Collection Time   04/23/11  7:27 AM      Component Value Range Comment   Glucose-Capillary 171 (*) 70 - 99 (mg/dL)    Comment 1 Documented in Chart      Comment 2 Notify RN     GLUCOSE, CAPILLARY     Status: Abnormal   Collection Time   04/23/11 11:16 AM      Component Value Range Comment   Glucose-Capillary 311 (*) 70 - 99 (mg/dL)    Comment 1 Documented in Chart      Comment 2 Notify RN     GLUCOSE, CAPILLARY     Status: Abnormal    Collection Time   04/23/11  4:32 PM      Component Value Range Comment   Glucose-Capillary 305 (*) 70 - 99 (mg/dL)    Comment 1 Notify RN      Comment 2 Documented in Chart     HEMOGLOBIN AND HEMATOCRIT, BLOOD     Status: Abnormal   Collection Time   04/23/11  5:50 PM      Component Value Range Comment   Hemoglobin 8.3 (*) 13.0 - 17.0 (g/dL)    HCT 63.8 (*) 75.6 - 52.0 (%)   GLUCOSE, CAPILLARY     Status: Abnormal   Collection Time   04/23/11  8:10 PM      Component Value Range Comment   Glucose-Capillary 353 (*) 70 - 99 (mg/dL)    Comment 1 Notify RN      Comment 2 Documented in Chart     GLUCOSE, CAPILLARY     Status: Abnormal   Collection Time   04/23/11 11:50 PM      Component Value Range Comment   Glucose-Capillary 121 (*) 70 -  99 (mg/dL)    Comment 1 Notify RN      Comment 2 Documented in Chart     GLUCOSE, CAPILLARY     Status: Abnormal   Collection Time   04/24/11  4:04 AM      Component Value Range Comment   Glucose-Capillary 198 (*) 70 - 99 (mg/dL)   BASIC METABOLIC PANEL     Status: Abnormal   Collection Time   04/24/11  5:30 AM      Component Value Range Comment   Sodium 137  135 - 145 (mEq/L)    Potassium 4.4  3.5 - 5.1 (mEq/L)    Chloride 108  96 - 112 (mEq/L)    CO2 22  19 - 32 (mEq/L)    Glucose, Bld 155 (*) 70 - 99 (mg/dL)    BUN 14  6 - 23 (mg/dL)    Creatinine, Ser 4.09 (*) 0.50 - 1.35 (mg/dL)    Calcium 8.3 (*) 8.4 - 10.5 (mg/dL)    GFR calc non Af Amer 48 (*) >90 (mL/min)    GFR calc Af Amer 56 (*) >90 (mL/min)   CBC     Status: Abnormal   Collection Time   04/24/11  5:30 AM      Component Value Range Comment   WBC 4.2  4.0 - 10.5 (K/uL)    RBC 2.60 (*) 4.22 - 5.81 (MIL/uL)    Hemoglobin 8.2 (*) 13.0 - 17.0 (g/dL)    HCT 81.1 (*) 91.4 - 52.0 (%)    MCV 94.2  78.0 - 100.0 (fL)    MCH 31.5  26.0 - 34.0 (pg)    MCHC 33.5  30.0 - 36.0 (g/dL)    RDW 78.2 (*) 95.6 - 15.5 (%)    Platelets 74 (*) 150 - 400 (K/uL)   GLUCOSE, CAPILLARY     Status:  Abnormal   Collection Time   04/24/11  7:41 AM      Component Value Range Comment   Glucose-Capillary 136 (*) 70 - 99 (mg/dL)    Comment 1 Notify RN     GLUCOSE, CAPILLARY     Status: Abnormal   Collection Time   04/24/11 11:19 AM      Component Value Range Comment   Glucose-Capillary 262 (*) 70 - 99 (mg/dL)    Comment 1 Notify RN        HPI : The patient is a 66 year old man with a past medical history significant for NASH cirrhosis, portal gastropathy, esophageal varices, recurrent upper GI bleeding, chronic atrial fibrillation, and type 2 diabetes mellitus. He presented to the emergency department on April 17th 2013 with a chief complaint of progressive weakness and black stools for 2-3 weeks. In the emergency department, he was noted to be afebrile and hemodynamically stable. His lab data were significant for a hemoglobin of 8.5, platelet count of 97, glucose of 475, creatinine of 1.56, PT of 17.2, INR of 1.38, PTT of 36, guaiac positive stool, normal lipase of 34, and normal liver transaminases. He was admitted for further evaluation and management. For additional details, please see the dictated history and physical.  HOSPITAL COURSE: the patient was started on IV fluid hydration. IV Protonix was ordered every 12 hours. His hemoglobin and hematocrit were monitored closely. Most of his chronic medications were continued with the exception of Lasix as he was felt to be volume contracted. Gastroenterology was consulted. Dr. Jena Gauss provided the initial consultation. He agreed with medical management. He decided to pursue further  evaluation with an upper endoscopy. The results of the EGD were dictated above. In addition, there was a small hiatal hernia and minimal changes of portal gastropathy. The prominent linear arrays of vascular ectasia were ablated. Following the procedure, Carafate was ordered.  The patient's hemoglobin fell to a nadir of 7.4. He was transfused one unit of blood. His  hemoglobin improved to 8.3 and stabilized at 8.2 prior to discharge.  The patient remained hemodynamically stable and afebrile. His stool became brown and there was no further evidence of melena during hospitalization. His insulin was adjusted for better control. His creatinine improved to 1.48 at the time of discharge which is about baseline. His ammonia level was modestly elevated, but he demonstrated no signs or symptoms consistent with hepatic encephalopathy. He was maintained on lactulose therapy.  He will followup with Dr. Jena Gauss in 3-4 weeks. The plan is for a followup endoscopy in one month.    Discharge Exam:  Blood pressure 124/69, pulse 61, temperature 98.1 F (36.7 C), temperature source Oral, resp. rate 20, height 6\' 1"  (1.854 m), weight 107.5 kg (236 lb 15.9 oz), SpO2 100.00%.     Discharge Orders    Future Appointments: Provider: Department: Dept Phone: Center:   05/19/2011 10:30 AM [facility] [phone] None   05/24/2011 11:00 AM Randall An, MD [facility] [phone] None     Future Orders Please Complete By Expires   Diet Carb Modified      Increase activity slowly      Discharge instructions      Comments:   CALL DR. Luvenia Starch OFFICE FOR A FOLLOWUP APPOINTMENT. YOU'LL NEED TO HAVE ANOTHER EGD IN ONE MONTH.      Follow-up Information    Follow up with Eula Listen, MD. Schedule an appointment as soon as possible for a visit in 3 weeks.   Contact information:   [address] [place] [address] [phone]          Total discharge time: 35 minutes.   Signed: Wilmina Maxham 04/24/2011, 1:37 PM

## 2011-04-24 NOTE — Progress Notes (Signed)
Pt ambulated the half of the hallway with a cane. Pt tolerated well.

## 2011-04-26 LAB — TYPE AND SCREEN
ABO/RH(D): O POS
Unit division: 0

## 2011-04-27 ENCOUNTER — Encounter (HOSPITAL_COMMUNITY): Payer: Self-pay | Admitting: Internal Medicine

## 2011-04-27 NOTE — Progress Notes (Signed)
Utilization review completed.  

## 2011-04-28 ENCOUNTER — Other Ambulatory Visit: Payer: Self-pay | Admitting: Gastroenterology

## 2011-04-28 ENCOUNTER — Encounter (HOSPITAL_COMMUNITY): Admission: RE | Payer: Self-pay | Source: Ambulatory Visit

## 2011-04-28 ENCOUNTER — Encounter: Payer: Self-pay | Admitting: Gastroenterology

## 2011-04-28 ENCOUNTER — Ambulatory Visit (HOSPITAL_COMMUNITY): Admission: RE | Admit: 2011-04-28 | Payer: Medicare Other | Source: Ambulatory Visit | Admitting: Internal Medicine

## 2011-04-28 DIAGNOSIS — D649 Anemia, unspecified: Secondary | ICD-10-CM

## 2011-04-28 SURGERY — EGD (ESOPHAGOGASTRODUODENOSCOPY)
Anesthesia: Moderate Sedation

## 2011-04-28 NOTE — Telephone Encounter (Signed)
EGD w/APC scheduled for 05/16- instructions mailed to home address

## 2011-05-04 NOTE — Progress Notes (Signed)
Records requested again.  

## 2011-05-05 NOTE — Progress Notes (Signed)
REVIEWED.  

## 2011-05-17 ENCOUNTER — Encounter (HOSPITAL_COMMUNITY): Payer: Self-pay

## 2011-05-19 ENCOUNTER — Other Ambulatory Visit (HOSPITAL_COMMUNITY): Payer: Medicare Other

## 2011-05-19 MED ORDER — SODIUM CHLORIDE 0.45 % IV SOLN
Freq: Once | INTRAVENOUS | Status: AC
  Administered 2011-05-20: 1000 mL via INTRAVENOUS

## 2011-05-20 ENCOUNTER — Ambulatory Visit (HOSPITAL_COMMUNITY)
Admission: RE | Admit: 2011-05-20 | Discharge: 2011-05-20 | Disposition: A | Discharge status: Home / Self Care | Payer: Medicare Other | Source: Ambulatory Visit | Attending: Internal Medicine | Admitting: Internal Medicine

## 2011-05-20 ENCOUNTER — Encounter (HOSPITAL_COMMUNITY)
Admission: RE | Disposition: A | Discharge status: Home / Self Care | Payer: Self-pay | Source: Ambulatory Visit | Attending: Internal Medicine

## 2011-05-20 ENCOUNTER — Encounter (HOSPITAL_COMMUNITY): Payer: Self-pay | Admitting: *Deleted

## 2011-05-20 DIAGNOSIS — D649 Anemia, unspecified: Secondary | ICD-10-CM

## 2011-05-20 DIAGNOSIS — K922 Gastrointestinal hemorrhage, unspecified: Secondary | ICD-10-CM

## 2011-05-20 DIAGNOSIS — Z79899 Other long term (current) drug therapy: Secondary | ICD-10-CM | POA: Insufficient documentation

## 2011-05-20 DIAGNOSIS — I10 Essential (primary) hypertension: Secondary | ICD-10-CM | POA: Insufficient documentation

## 2011-05-20 DIAGNOSIS — Z794 Long term (current) use of insulin: Secondary | ICD-10-CM | POA: Insufficient documentation

## 2011-05-20 DIAGNOSIS — K31819 Angiodysplasia of stomach and duodenum without bleeding: Secondary | ICD-10-CM

## 2011-05-20 DIAGNOSIS — D509 Iron deficiency anemia, unspecified: Secondary | ICD-10-CM

## 2011-05-20 DIAGNOSIS — K319 Disease of stomach and duodenum, unspecified: Secondary | ICD-10-CM

## 2011-05-20 DIAGNOSIS — K31811 Angiodysplasia of stomach and duodenum with bleeding: Secondary | ICD-10-CM | POA: Insufficient documentation

## 2011-05-20 DIAGNOSIS — E119 Type 2 diabetes mellitus without complications: Secondary | ICD-10-CM | POA: Insufficient documentation

## 2011-05-20 DIAGNOSIS — E785 Hyperlipidemia, unspecified: Secondary | ICD-10-CM | POA: Insufficient documentation

## 2011-05-20 HISTORY — PX: ESOPHAGOGASTRODUODENOSCOPY: SHX5428

## 2011-05-20 HISTORY — PX: HOT HEMOSTASIS: SHX5433

## 2011-05-20 LAB — CBC
MCH: 30.3 pg (ref 26.0–34.0)
MCV: 88.3 fL (ref 78.0–100.0)
Platelets: 90 10*3/uL — ABNORMAL LOW (ref 150–400)
RDW: 14.5 % (ref 11.5–15.5)

## 2011-05-20 SURGERY — EGD (ESOPHAGOGASTRODUODENOSCOPY)
Anesthesia: Moderate Sedation

## 2011-05-20 MED ORDER — MEPERIDINE HCL 100 MG/ML IJ SOLN
INTRAMUSCULAR | Status: DC | PRN
  Administered 2011-05-20: 50 mg via INTRAVENOUS

## 2011-05-20 MED ORDER — MEPERIDINE HCL 100 MG/ML IJ SOLN
INTRAMUSCULAR | Status: AC
  Filled 2011-05-20: qty 1

## 2011-05-20 MED ORDER — MIDAZOLAM HCL 5 MG/5ML IJ SOLN
INTRAMUSCULAR | Status: AC
  Filled 2011-05-20: qty 10

## 2011-05-20 MED ORDER — BUTAMBEN-TETRACAINE-BENZOCAINE 2-2-14 % EX AERO
INHALATION_SPRAY | CUTANEOUS | Status: DC | PRN
  Administered 2011-05-20: 2 via TOPICAL

## 2011-05-20 MED ORDER — MIDAZOLAM HCL 5 MG/5ML IJ SOLN
INTRAMUSCULAR | Status: DC | PRN
  Administered 2011-05-20: 2 mg via INTRAVENOUS

## 2011-05-20 NOTE — H&P (Signed)
Primary Care Physician:  Kirk Ruths, MD, MD Primary Gastroenterologist:  Dr. Jena Gauss Pre-Procedure History & Physical: HPI:  [name] is a 66 y.o. male here for followup EGD. 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  History of TI PS. Esophageal varices decompressed at last EGD.  Patient has not seen any melena or hematochezia since APC treatment. 4 days worth of diarrhea but that has since resolved.  Past Medical History  Diagnosis Date  . Hypertension     negative stress nuclear study in 02/2007; normal coronaries in 2006; Echo in 2006-aortic sclerosis, Mild biatrial enlargement; normal LV  . Pulmonary hypertension     moderate at cath-2006  . Hyperlipidemia   . Syncope 2008    orthostatic hypotension  . Diabetes mellitus     insulin; onset in 1999; retinopathy, neuropathy and nephropathy  . Chronic atrial fibrillation     onset in 1995; anticoagulation discontinued in 2006  . Chronic kidney disease     stage III; creatinine of 1.6 in 8/07, 1.35 and 05/2008  . Crohn's disease     -lower GI bleed in 08/2002;  Pentasa stopped by Dr. Brooke Dare at [facility] ; clinical remission.  . Obesity   . NASH (nonalcoholic steatohepatitis)     cirrhosis with esophageal varices; h/o GI bleeding. EGD-2010 grade 2 esophageal varices-banded with good hemostasis; portal gastropathy, friable gastric mucosa; small hiatal hernia.  EGD during recurrent melena one week later showed 2 eschars at previously placed bands, decompressed esophageal varices grade 1-2,four columns, no bleeding stigmata; portal gastropathy, normal appearing small bowel.    . Esophageal varices     s/p band ligation multiple times, Portal Hypertensive gastropathy;  . S/P TIPS (transjugular intrahepatic portosystemic shunt)     06/2009-TIPS procedure at [facility]  . GI bleed     07/2009-admitted with recurrent upper GI bleed secondary to esophageal varices-banded x3  . Pleural effusion, left     s/p  thoracentesis-multiple  . Depression   . Anemia     Iron def requiring parenteral iron or injections in past, followed by Dr. Glenford Peers; B12 def  . Duodenal ulcer 1980s  . Nephrolithiasis     cystoscopy with stone extraction in 1990  . Ascites   . Hiatal hernia 12/02/2009  . Esophageal erosions     consistent with mild erosive reflux esophagitis  . Portal hypertensive gastropathy   . 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 Jena Gauss    Past Surgical History  Procedure Date  . Tips procedure 09/16/09  . 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 Cholecystectomy   . Circumcision -1992  . Colonoscopy -2008    Reportedly normal, done in [place]  . Multiple thoracenteses   . Esophagogastroduodenoscopy 12/02/2009    distal esophageal erosions, small hiatal hernia  . Tips procedure 2011  . Colonoscopy 01/26/2011    adequate preparation.  normal rectum. Somewhat  diffusely friable colonic mucosa. A single 4 mm polyp in the ascending segment. couple of mucosal breaks in the distal 10 cm of terminal ileum with  a couple of erosions at the junction  the TI  and colon.. Tubular adenoma, chronic ileitis. Next TCS 01/2016.  Marland Kitchen Esophagogastroduodenoscopy 01/26/2011    Decompressed/abscent esophagel varices s/p TIPS. small hh. GAVE. Procedure: ESOPHAGOGASTRODUODENOSCOPY (EGD);  Surgeon: Corbin Ade, MD;  [facility];  Service: Endoscopy;  Laterality:  N/A;  . Esophagogastroduodenoscopy 04/22/2011    Procedure: ESOPHAGOGASTRODUODENOSCOPY (EGD);  Surgeon: Corbin Ade, MD;  Location: [facility];  Service: Endoscopy;  Laterality: N/A;    Prior to Admission medications   Medication Sig Start Date End Date Taking? Authorizing Provider  ALPRAZolam Prudy Feeler) 0.5 MG tablet Take 0.5 mg by mouth Daily.  02/08/11  Yes Historical Provider, MD  digoxin (LANOXIN) 0.125 MG tablet Take 125 mcg by mouth daily.  01/09/11  Yes  Jonelle Sidle, MD  furosemide (LASIX) 40 MG tablet Take 1 tablet (40 mg total) by mouth daily. 01/18/11  Yes Jodelle Gross, NP  gabapentin (NEURONTIN) 300 MG capsule Take 300 mg by mouth at bedtime.  05/29/10  Yes Historical Provider, MD  insulin aspart (NOVOLOG) 100 UNIT/ML injection Inject 0-20 Units into the skin 2 (two) times daily. Patient is on Sliding Scale   Yes Historical Provider, MD  insulin glargine (LANTUS) 100 UNIT/ML injection Inject 80 Units into the skin at bedtime.    Yes Historical Provider, MD  lactulose (CHRONULAC) 10 GM/15ML solution Take 20 g by mouth 4 (four) times daily.    Yes Historical Provider, MD  propranolol (INDERAL) 10 MG tablet Take 5 mg by mouth 2 (two) times daily.    Yes Historical Provider, MD  PROTONIX 40 MG tablet TAKE (1) TABLET BY MOUTH TWICE DAILY. 02/12/11  Yes Tiffany Kocher, PA  simvastatin (ZOCOR) 40 MG tablet Take 40 mg by mouth at bedtime.     Yes Historical Provider, MD  sucralfate (CARAFATE) 1 GM/10ML suspension Take 10 mLs (1 g total) by mouth 4 (four) times daily -  with meals and at bedtime. 04/24/11 05/24/11 Yes Elliot Cousin, MD    Allergies as of 04/28/2011 - Review Complete 04/22/2011  Allergen Reaction Noted  . Codeine Other (See Comments)   . Other Rash 05/05/2010    Family History  Problem Relation Age of Onset  . Colon cancer Neg Hx   . Inflammatory bowel disease Neg Hx   . Liver disease Neg Hx   . Anesthesia problems Neg Hx   . Hypotension Neg Hx   . Malignant hyperthermia Neg Hx   . Pseudochol deficiency Neg Hx     History   Social History  . Marital Status: Divorced    Spouse Name: N/A    Number of Children: 1  . Years of Education: N/A   Occupational History  . retired     disabled   Social History Main Topics  . Smoking status: Never Smoker   . Smokeless tobacco: Never Used  . Alcohol Use: No  . Drug Use: No  . Sexually Active: Yes    Birth Control/ Protection: None   Other Topics Concern  . Not on  file   Social History Narrative  . No narrative on file    Review of Systems: See HPI, otherwise negative ROS  Physical Exam: BP 117/50  Pulse 80  Temp(Src) 97.5 F (36.4 C) (Oral)  Resp 20  Ht 6\' 1"  (1.854 m)  Wt 238 lb (107.956 kg)  BMI 31.40 kg/m2  SpO2 100% General:   Alert,  Well-developed, well-nourished, pleasant and cooperative in NAD Skin:  Intact without significant lesions or rashes. Eyes:  Sclera clear, no icterus.   Conjunctiva pink. Ears:  Normal auditory acuity. Nose:  No deformity, discharge,  or lesions. Mouth:  No deformity or lesions. Neck:  Supple; no masses or thyromegaly. No significant cervical adenopathy. Lungs:  Clear throughout to  auscultation.   No wheezes, crackles, or rhonchi. No acute distress. Heart:  Regular rate and rhythm; no murmurs, clicks, rubs,  or gallops. Abdomen: Non-distended, normal bowel sounds.  Soft and nontender without appreciable mass or hepatosplenomegaly.  Pulses:  Normal pulses noted. Extremities:  Without clubbing or edema.  Impression/Plan:  History of Nash/cirrhosis status post TIPS. Now with gastric antral vascular ectasia status post 1 [facility] session last month. He now presents for followup EGD with additional APC treatment as appropriate.   The risks, benefits, limitations, alternatives and imponderables have been reviewed with the patient. Potential for esophageal dilation, biopsy, etc. have also been reviewed.  Questions have been answered. All parties agreeable.

## 2011-05-20 NOTE — Discharge Instructions (Signed)
EGD Discharge instructions Please read the instructions outlined below and refer to this sheet in the next few weeks. These discharge instructions provide you with general information on caring for yourself after you leave the hospital. Your doctor may also give you specific instructions. While your treatment has been planned according to the most current medical practices available, unavoidable complications occasionally occur. If you have any problems or questions after discharge, please call your doctor. ACTIVITY  You may resume your regular activity but move at a slower pace for the next 24 hours.   Take frequent rest periods for the next 24 hours.   Walking will help expel (get rid of) the air and reduce the bloated feeling in your abdomen.   No driving for 24 hours (because of the anesthesia (medicine) used during the test).   You may shower.   Do not sign any important legal documents or operate any machinery for 24 hours (because of the anesthesia used during the test).  NUTRITION  Drink plenty of fluids.   You may resume your normal diet.   Begin with a light meal and progress to your normal diet.   Avoid alcoholic beverages for 24 hours or as instructed by your caregiver.  MEDICATIONS  You may resume your normal medications unless your caregiver tells you otherwise.  WHAT YOU CAN EXPECT TODAY  You may experience abdominal discomfort such as a feeling of fullness or "gas" pains.  FOLLOW-UP  Your doctor will discuss the results of your test with you.  SEEK IMMEDIATE MEDICAL ATTENTION IF ANY OF THE FOLLOWING OCCUR:  Excessive nausea (feeling sick to your stomach) and/or vomiting.   Severe abdominal pain and distention (swelling).   Trouble swallowing.   Temperature over 101 F (37.8 C).   Rectal bleeding or vomiting of blood.     Clear liquid lunch; then advance diet slowly later today  CBC in 8 weeks  Office visit with [place] in 3 months

## 2011-05-20 NOTE — Op Note (Signed)
[facility] [address] [place], [place]  [address]  ENDOSCOPY PROCEDURE REPORT  PATIENT:  [name], Willie  MR#:  [id number] BIRTHDATE:  11/23/1945, 65 yrs. old  GENDER:  male  ENDOSCOPIST:  R. Roetta Sessions, MD FACP [facility] Referred by:  Karleen Hampshire, M.D. Glenford Peers, M.D.  PROCEDURE DATE:  05/20/2011 PROCEDURE:  EGD with APC treatment of GAV lesions. Hemoglobin up to 9.1 today which is a almost a gram higher since first APC treatment  Indications:              Iron deficiency anemia secondary to slow GI bleed felt to be due to GAVE  INFORMED CONSENT:   The risks, benefits, limitations, alternatives and imponderables have been discussed.  The potential for biopsy, esophogeal dilation, etc. have also been reviewed.  Questions have been answered.  All parties agreeable.  Please see the history and physical in the medical record for more information.  MEDICATIONS:   Versed 2 mg IV and Demerol 50 mg IV in divided doses. Cetacaine spray.  DESCRIPTION OF PROCEDURE:   The EG-[id number] ([id number]) endoscope was introduced through the mouth and advanced to the second portion of the duodenum without difficulty or limitations.  The mucosal surfaces were surveyed very carefully during advancement of the scope and upon withdrawal.  Retroflexion view of the proximal stomach and esophagogastric junction was performed.  <<PROCEDUREIMAGES>>  FINDINGS:   esophageal varices decompressed. Some scar present from prior banding. Stomach empty. Changes consistent with portal gastropathy. Linear rays of the ectatic antral mucosa but appeared to be less prominent than previously seen. Normal first and second portion of the duodenum  THERAPEUTIC / DIAGNOSTIC MANEUVERS PERFORMED:    GAVE lesions in the antrum painted with APC circular probe. This was done without difficulty or apparent  complication. Hemostasis maintained.  COMPLICATIONS:   None  IMPRESSION:  Portal gastropathy.  GAVE  - status post APC ablation. Hemoglobin trending upward now. Hopefully, he will not need close interval repeat EGD. Will follow hemoglobin to guide [place] in decision making in the future.  RECOMMENDATIONS:  CBC in 8 weeks. Office visit with [place] in 12 weeks.  ______________________________ R. Roetta Sessions, MD Caleen Essex  CC:  n. eSIGNED:   R. Roetta Sessions at 05/20/2011 10:22 AM  [name], [phone]

## 2011-05-24 ENCOUNTER — Encounter (HOSPITAL_COMMUNITY): Payer: Medicare Other | Attending: Oncology | Admitting: Oncology

## 2011-05-24 ENCOUNTER — Encounter (HOSPITAL_COMMUNITY): Payer: Self-pay | Admitting: Oncology

## 2011-05-24 VITALS — BP 105/60 | HR 57 | Temp 98.4°F | Wt 244.3 lb

## 2011-05-24 DIAGNOSIS — D509 Iron deficiency anemia, unspecified: Secondary | ICD-10-CM

## 2011-05-24 DIAGNOSIS — K7460 Unspecified cirrhosis of liver: Secondary | ICD-10-CM

## 2011-05-24 DIAGNOSIS — D649 Anemia, unspecified: Secondary | ICD-10-CM | POA: Insufficient documentation

## 2011-05-24 DIAGNOSIS — R159 Full incontinence of feces: Secondary | ICD-10-CM

## 2011-05-24 DIAGNOSIS — D638 Anemia in other chronic diseases classified elsewhere: Secondary | ICD-10-CM

## 2011-05-24 NOTE — Progress Notes (Signed)
CC:   Kirk Ruths, M.D. Jonathon Bellows, MD FACP [facility]  DIAGNOSES: 1. Multifactorial anemia with an element of iron deficiency, as well     as anemia of chronic disease secondary to his cirrhosis of the     liver.  He has had gastrointestinal blood losses from his varices     in the past and now has GAVE status post an ablation of an     arteriovenous malformation by Dr. Jena Gauss in April. 2. GAVE (gastric antral vascular ectasia). 3. Obesity. 4. Hypertension. 5. Hypercholesterolemia. 6. Diabetes mellitus. 7. Depression. 8. History of Crohn disease of the small bowel. Jessy states that he may be too old to be transplanted in the future. He still does not meet the criteria for transplant immediately, so they are still watching him at [facility].  What has recently happened is that he had a drop in his hemoglobin and that was due to this AV malformation, it sounds like.  He had colonoscopy in the recent past as well, but that was not very remarkable.  Dr. Jena Gauss has diagnosed him with GAVE, however.  He just does not feel well today.  He is having lots of stools.  Sometimes he is incontinent of stool.  He states as long as he is sitting down, he can control it, but when he walks he cannot control his bowels.  This started about 6 weeks ago and Dr. Jena Gauss, he states attributes it to the lactulose which he is on for his liver and his ammonia level.  Pinchas states that he has been on the same dose for a year and this only started 6 weeks ago.  He denied having any associated fever, chills, or other contact with people that were sick with diarrhea, has been hospitalized, etc.  He has only been in the hospital for these EGD procedures in the recent past.  He had 1 in April and 1 in May.  His hemoglobin is at least stable from the other day.  Platelets and white count are stable.  His vital signs show that his blood pressure is adequate at 105/60 today, pulse is right around 60,  respirations are 18 and unlabored.  He just looks chronically ill, more than usual.  He denies any pain, but his diarrhea is very profoundly strong-smelling, according to my nurse.  We got a sample today for C diff and routine pathogens just to make sure.  He states that he just cannot go out and be safe anymore without having an accident.  In June, we will check his blood counts, including ferritin and iron studies.  He may need another iron infusion.  Since he just had blood work, I do want to stick him again today, especially since his hemoglobin is somewhat stable.  We will see him back 1 way or the other in July.   ______________________________ Ladona Horns. Mariel Sleet, MD ESN/MEDQ  D:  05/24/2011  T:  05/24/2011  Job:  [id number]

## 2011-05-24 NOTE — Progress Notes (Signed)
This office note has been dictated.

## 2011-05-24 NOTE — Patient Instructions (Signed)
[name]  [id number] 12-13-1945 Dr. Glenford Peers   [facility]  Discharge Instructions  RECOMMENDATIONS MADE BY THE CONSULTANT AND ANY TEST RESULTS WILL BE SENT TO YOUR REFERRING DOCTOR.   EXAM FINDINGS BY MD TODAY AND SIGNS AND SYMPTOMS TO REPORT TO CLINIC OR PRIMARY MD: We will check a stool sample to make sure you don't have C diff.    MEDICATIONS PRESCRIBED: none   INSTRUCTIONS GIVEN AND DISCUSSED: Other :  Report blood in stool, increased shortness of breath, increased fatigue, etc.  SPECIAL INSTRUCTIONS/FOLLOW-UP: Lab work Needed in June and to see PA in 2 months.   I acknowledge that I have been informed and understand all the instructions given to me and received a copy. I do not have any more questions at this time, but understand that I may call the Specialty [facility] at [facility] at [phone] during business hours should I have any further questions or need assistance in obtaining follow-up care.    __________________________________________  _____________  __________ Signature of Patient or Authorized Representative            Date                   Time    __________________________________________ Nurse's Signature

## 2011-05-26 ENCOUNTER — Encounter: Payer: Self-pay | Admitting: Internal Medicine

## 2011-05-27 ENCOUNTER — Ambulatory Visit (INDEPENDENT_AMBULATORY_CARE_PROVIDER_SITE_OTHER): Payer: Medicare Other | Admitting: Gastroenterology

## 2011-05-27 ENCOUNTER — Encounter: Payer: Self-pay | Admitting: Gastroenterology

## 2011-05-27 VITALS — BP 116/52 | HR 67 | Temp 97.1°F | Ht 73.0 in | Wt 246.2 lb

## 2011-05-27 DIAGNOSIS — K7581 Nonalcoholic steatohepatitis (NASH): Secondary | ICD-10-CM

## 2011-05-27 DIAGNOSIS — R197 Diarrhea, unspecified: Secondary | ICD-10-CM

## 2011-05-27 DIAGNOSIS — K529 Noninfective gastroenteritis and colitis, unspecified: Secondary | ICD-10-CM | POA: Insufficient documentation

## 2011-05-27 DIAGNOSIS — K7689 Other specified diseases of liver: Secondary | ICD-10-CM

## 2011-05-27 MED ORDER — RIFAXIMIN 550 MG PO TABS: 550.0000 mg | ORAL_TABLET | Freq: Two times a day (BID) | ORAL | Status: DC

## 2011-05-27 NOTE — Progress Notes (Signed)
Faxed to PCP

## 2011-05-27 NOTE — Progress Notes (Signed)
Primary Care Physician: Kirk Ruths, MD, MD  Primary Gastroenterologist:  Roetta Sessions, MD   Chief Complaint  Patient presents with  . Diarrhea    6-7 x a day    HPI: [name] is a 66 y.o. male here for further evaluation of six weeks of change in stools. Used to have 3-4 soft BMs daily on Lactulose. For past six weeks, he is having 5-6 watery, stools daily. No melena, brbpr. Little warning. Having some incontinence. Afraid to stop lactulose b/c of h/o HE. No n/v. No weight loss. No recent ABX. No recent travel. No ill contacts or medication changes. Stools done this week were negative for CDiff PCR. Stool culture preliminary is negative.    Current Outpatient Prescriptions  Medication Sig Dispense Refill  . ALPRAZolam (XANAX) 0.5 MG tablet Take 0.5 mg by mouth Daily.       . digoxin (LANOXIN) 0.125 MG tablet Take 125 mcg by mouth daily.       . furosemide (LASIX) 40 MG tablet Take 1 tablet (40 mg total) by mouth daily.  30 tablet  11  . gabapentin (NEURONTIN) 300 MG capsule Take 300 mg by mouth at bedtime.       . insulin aspart (NOVOLOG) 100 UNIT/ML injection Inject 0-20 Units into the skin 2 (two) times daily. Patient is on Sliding Scale      . insulin glargine (LANTUS) 100 UNIT/ML injection Inject 80 Units into the skin at bedtime.       Marland Kitchen lactulose (CHRONULAC) 10 GM/15ML solution Take 20 g by mouth 4 (four) times daily.       . propranolol (INDERAL) 10 MG tablet Take 5 mg by mouth 2 (two) times daily.       Marland Kitchen PROTONIX 40 MG tablet TAKE (1) TABLET BY MOUTH TWICE DAILY.  60 each  5  . simvastatin (ZOCOR) 40 MG tablet Take 40 mg by mouth at bedtime.        . sucralfate (CARAFATE) 1 GM/10ML suspension Take 10 mLs (1 g total) by mouth 4 (four) times daily -  with meals and at bedtime.  420 mL  2    Allergies as of 05/27/2011 - Review Complete 05/27/2011  Allergen Reaction Noted  . Codeine Other (See Comments)   . Other Rash 05/05/2010    ROS:  General: Negative for  anorexia, weight loss, fever, chills, fatigue. chronic weakness. ENT: Negative for hoarseness, difficulty swallowing , nasal congestion. CV: Negative for chest pain, angina, palpitations, dyspnea on exertion, mild peripheral edema.  Respiratory: Negative for dyspnea at rest, dyspnea on exertion, cough, sputum, wheezing.  GI: See history of present illness. GU:  Negative for dysuria, hematuria, urinary incontinence, urinary frequency, nocturnal urination.  Endo: Negative for unusual weight change.    Physical Examination:   BP 116/52  Pulse 67  Temp(Src) 97.1 F (36.2 C) (Temporal)  Ht 6\' 1"  (1.854 m)  Wt 246 lb 3.2 oz (111.676 kg)  BMI 32.48 kg/m2  General: chronically ill-appearing in no acute distress.  Eyes: No icterus. Mouth: Oropharyngeal mucosa moist and pink , no lesions erythema or exudate. Lungs: Clear to auscultation bilaterally.  Heart: Regular rate and rhythm, no murmurs rubs or gallops.  Abdomen: Bowel sounds are normal, nontender, nondistended, no hepatosplenomegaly or masses, no abdominal bruits or hernia , no rebound or guarding.   Extremities: No lower extremity edema. No clubbing or deformities. Neuro: Alert and oriented x 4   Skin: Warm and dry, no jaundice.   Psych:  Alert and cooperative, normal mood and affect.  Labs:  Lab Results  Component Value Date   WBC 4.5 05/20/2011   HGB 9.1* 05/20/2011   HCT 26.5* 05/20/2011   MCV 88.3 05/20/2011   PLT 90* 05/20/2011    Imaging Studies: No results found.

## 2011-05-27 NOTE — Assessment & Plan Note (Signed)
Chronic diarrhea of 6 weeks duration. Need to check for Giardia. Add Xifaxan 550mg  BID to cover for HE and have him reduce lactulose to BID. This will be only temporarily as patient cannot afford Xifaxan ($1300/mo), so we will give him two weeks samples. Hopefully this will make stools more manageable while work-up in progress. He has h/o Crohn's. Has not been on maintenance meds for years. Last TCS showed somewhat diffusely friable colon and some mucosal breaks in distal TI with couple of erosions. BX showed chronic ileitis. ?needs trial of Entocort? Will discuss further with Dr. Jena Gauss today and further recommendations to follow.

## 2011-05-27 NOTE — Patient Instructions (Signed)
Decrease lactulose to twice daily only WHILE ON XIFAXAN. Xifaxan 550mg  twice daily. Two weeks of samples provided. Collect stool for Giardia.  I will discuss case further with Dr. Jena Gauss and let you know his recommendations.

## 2011-05-28 LAB — STOOL CULTURE

## 2011-06-01 ENCOUNTER — Telehealth: Payer: Self-pay | Admitting: Gastroenterology

## 2011-06-01 NOTE — Telephone Encounter (Signed)
Spoke with pt- he will try to get sample to the lab today or tomorrow. He is having 4-5 stools a day. He said he seemed to be doing better with the change in medications.

## 2011-06-01 NOTE — Progress Notes (Signed)
Pt aware.

## 2011-06-01 NOTE — Telephone Encounter (Signed)
Please remind patient to collect stool specimen. Please ask him how many stools daily is he having at this point.

## 2011-06-01 NOTE — Progress Notes (Signed)
Tried to call pt- LMOM 

## 2011-06-01 NOTE — Telephone Encounter (Signed)
Tried to call pt- LMOM 

## 2011-06-01 NOTE — Progress Notes (Signed)
Discussed plan with Dr. Jena Gauss. Agrees with decreased lactulose to bid and adding Xifaxan 550mg  BID for Hepatic encephalopathy prevention. Patient will not be able to afford long term but we are trying to provide symptomatic relief of diarrhea during work-up. He will collect stool for Giardia. If still with persistent diarrhea, would recommend titrate lactulose back further with goal of 3 soft BMs daily. His change in stools could be secondary to his Crohn's or diabetic enteropathy.   We will keep close follow-up with him.

## 2011-06-02 NOTE — Telephone Encounter (Signed)
Good. Await stools.

## 2011-06-04 LAB — GIARDIA/CRYPTOSPORIDIUM (EIA)
Cryptosporidium Screen (EIA): NEGATIVE
Giardia Screen (EIA): NEGATIVE

## 2011-06-10 NOTE — Progress Notes (Signed)
Quick Note:  Can we find out if there is a P.A.we can fill out to get Xifaxan covered for him. 550mg  BID for Hepatic encephalopathy. He should have about 3 soft stools daily. He can drop his lactulose to 1/2 capful BID. If still having more than 3 soft stools, then drop to 1/2 capful once daily.   OV with RMR. ______

## 2011-06-10 NOTE — Progress Notes (Signed)
Quick Note:  Stool negative. How is he doing? Still on xifaxan? What dose of lactulose? ______

## 2011-06-15 NOTE — Progress Notes (Signed)
Quick Note:  According to [facility], pt does not need a PA for this medication but his copay is $567.00. I will check to see if there is any type of pt assistance with this medication. ______

## 2011-06-16 ENCOUNTER — Encounter (HOSPITAL_COMMUNITY): Payer: Medicare Other | Attending: Oncology

## 2011-06-16 DIAGNOSIS — R159 Full incontinence of feces: Secondary | ICD-10-CM

## 2011-06-16 DIAGNOSIS — D649 Anemia, unspecified: Secondary | ICD-10-CM

## 2011-06-16 LAB — CBC
HCT: 28.1 % — ABNORMAL LOW (ref 39.0–52.0)
Hemoglobin: 9.2 g/dL — ABNORMAL LOW (ref 13.0–17.0)
MCV: 88.6 fL (ref 78.0–100.0)
RDW: 14.8 % (ref 11.5–15.5)
WBC: 4.7 10*3/uL (ref 4.0–10.5)

## 2011-06-16 LAB — IRON AND TIBC
Iron: 54 ug/dL (ref 42–135)
TIBC: 319 ug/dL (ref 215–435)
UIBC: 265 ug/dL (ref 125–400)

## 2011-06-16 LAB — VITAMIN B12: Vitamin B-12: 320 pg/mL (ref 211–911)

## 2011-06-16 LAB — FOLATE: Folate: 14.1 ng/mL

## 2011-06-16 NOTE — Progress Notes (Signed)
Labs drawn today for cbc,ferr, Iron and IBC,Folate,VB12

## 2011-06-24 ENCOUNTER — Encounter: Payer: Self-pay | Admitting: Internal Medicine

## 2011-06-29 ENCOUNTER — Other Ambulatory Visit (HOSPITAL_COMMUNITY): Payer: Self-pay | Admitting: Oncology

## 2011-06-29 ENCOUNTER — Encounter (HOSPITAL_COMMUNITY): Payer: Self-pay | Admitting: *Deleted

## 2011-06-29 DIAGNOSIS — D509 Iron deficiency anemia, unspecified: Secondary | ICD-10-CM

## 2011-06-29 MED ORDER — FERUMOXYTOL INJECTION 510 MG/17 ML: 510.0000 mg | Freq: Once | INTRAVENOUS | Status: DC

## 2011-07-05 ENCOUNTER — Encounter (HOSPITAL_COMMUNITY): Payer: Medicare Other | Attending: Oncology

## 2011-07-05 VITALS — BP 115/60 | HR 69 | Temp 97.1°F

## 2011-07-05 DIAGNOSIS — D509 Iron deficiency anemia, unspecified: Secondary | ICD-10-CM

## 2011-07-05 DIAGNOSIS — D649 Anemia, unspecified: Secondary | ICD-10-CM | POA: Insufficient documentation

## 2011-07-05 MED ORDER — SODIUM CHLORIDE 0.9 % IJ SOLN
10.0000 mL | INTRAMUSCULAR | Status: DC | PRN
  Administered 2011-07-05: 10 mL via INTRAVENOUS
  Filled 2011-07-05: qty 10

## 2011-07-05 MED ORDER — SODIUM CHLORIDE 0.9 % IV SOLN
INTRAVENOUS | Status: DC
  Administered 2011-07-05: 11:00:00 via INTRAVENOUS

## 2011-07-05 MED ORDER — SODIUM CHLORIDE 0.9 % IJ SOLN
INTRAMUSCULAR | Status: AC
  Filled 2011-07-05: qty 10

## 2011-07-05 MED ORDER — SODIUM CHLORIDE 0.9 % IV SOLN
1020.0000 mg | Freq: Once | INTRAVENOUS | Status: AC
  Administered 2011-07-05: 1020 mg via INTRAVENOUS
  Filled 2011-07-05: qty 34

## 2011-07-05 NOTE — Progress Notes (Signed)
Tolerated fereheme well. 

## 2011-07-23 ENCOUNTER — Ambulatory Visit (INDEPENDENT_AMBULATORY_CARE_PROVIDER_SITE_OTHER): Payer: Medicare Other | Admitting: Internal Medicine

## 2011-07-23 ENCOUNTER — Encounter: Payer: Self-pay | Admitting: Internal Medicine

## 2011-07-23 VITALS — BP 132/66 | HR 80 | Temp 98.1°F | Ht 73.0 in | Wt 248.2 lb

## 2011-07-23 DIAGNOSIS — K7689 Other specified diseases of liver: Secondary | ICD-10-CM

## 2011-07-23 DIAGNOSIS — K7581 Nonalcoholic steatohepatitis (NASH): Secondary | ICD-10-CM

## 2011-07-23 DIAGNOSIS — D509 Iron deficiency anemia, unspecified: Secondary | ICD-10-CM

## 2011-07-23 DIAGNOSIS — K922 Gastrointestinal hemorrhage, unspecified: Secondary | ICD-10-CM

## 2011-07-23 DIAGNOSIS — K7290 Hepatic failure, unspecified without coma: Secondary | ICD-10-CM

## 2011-07-23 DIAGNOSIS — K429 Umbilical hernia without obstruction or gangrene: Secondary | ICD-10-CM

## 2011-07-23 NOTE — Progress Notes (Signed)
Primary Care Physician:  Kirk Ruths, MD Primary Gastroenterologist:  Dr. Jena Gauss  Pre-Procedure History & Physical: HPI:  [name] is a 66 y.o. male here for followup [place]/GI bleed/iron deficiency anemia/umbilical hernia/encephalopathy status post TIPS placement. Recent problems with diarrhea.  Diarrhea has settled down. No further episodes of incontinence. Stool back to baseline on lactulose 3 tablespoons daily. Has 3-4 semi-formed stools daily. Screen for Cryptosporidium/ giardia /Cdiff screen negative. He did very well on Xifaxan as far as mental status and diarrhea is concerned for the time he was on samples. He cannot afford this medication. He states it would cost him $600 out-of-pocket monthly. No melena rectal bleeding. He sees Dr. Mariel Sleet.  He gets iron infusions regularly. He has a history of a prior diagnosis of Crohn's disease however at [facility] he stopped his Pentasa. It is worth noting he had endoscopic ileitis and histologicevidence of  ileitis on his last colonoscopy earlier this year. His GAVE looked pretty good when last treated by me earlier this year. Varices decompressed status post TIPS. Weight stable. Dr. Orvan Falconer just removed a skin cancer from his right temporal area. Weight up 2 pounds since last visit. Past Medical History  Diagnosis Date  . Hypertension     negative stress nuclear study in 02/2007; normal coronaries in 2006; Echo in 2006-aortic sclerosis, Mild biatrial enlargement; normal LV  . Pulmonary hypertension     moderate at cath-2006  . Hyperlipidemia   . Syncope 2008    orthostatic hypotension  . Diabetes mellitus     insulin; onset in 1999; retinopathy, neuropathy and nephropathy  . Chronic atrial fibrillation     onset in 1995; anticoagulation discontinued in 2006  . Chronic kidney disease     stage III; creatinine of 1.6 in 8/07, 1.35 and 05/2008  . Crohn's disease     -lower GI bleed in 08/2002;  Pentasa stopped by Dr. Brooke Dare at [facility] ; clinical  remission.  . Obesity   . NASH (nonalcoholic steatohepatitis)     cirrhosis with esophageal varices; h/o GI bleeding. EGD-2010 grade 2 esophageal varices-banded with good hemostasis; portal gastropathy, friable gastric mucosa; small hiatal hernia.  EGD during recurrent melena one week later showed 2 eschars at previously placed bands, decompressed esophageal varices grade 1-2,four columns, no bleeding stigmata; portal gastropathy, normal appearing small bowel.    . Esophageal varices     s/p band ligation multiple times, Portal Hypertensive gastropathy;  . S/P TIPS (transjugular intrahepatic portosystemic shunt)     06/2009-TIPS procedure at [facility]  . GI bleed     07/2009-admitted with recurrent upper GI bleed secondary to esophageal varices-banded x3  . Pleural effusion, left     s/p thoracentesis-multiple  . Depression   . Anemia     Iron def requiring parenteral iron or injections in past, followed by Dr. Glenford Peers; B12 def  . Duodenal ulcer 1980s  . Nephrolithiasis     cystoscopy with stone extraction in 1990  . Ascites   . Hiatal hernia 12/02/2009  . Esophageal erosions     consistent with mild erosive reflux esophagitis  . Portal hypertensive gastropathy   . 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 Jena Gauss    Past Surgical History  Procedure Date  . Tips procedure 09/16/09  . 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 Cholecystectomy   . Circumcision -1992  . Colonoscopy -2008  Reportedly normal, done in [place]  . Multiple thoracenteses   . Esophagogastroduodenoscopy 12/02/2009    distal esophageal erosions, small hiatal hernia  . Tips procedure 2011  . Colonoscopy 01/26/2011    adequate preparation.  normal rectum. Somewhat  diffusely friable colonic mucosa. A single 4 mm polyp in the ascending segment. couple of mucosal breaks in the distal 10 cm of terminal ileum with  a couple of erosions at  the junction  the TI  and colon.. Tubular adenoma, chronic ileitis. Next TCS 01/2016.  Marland Kitchen Esophagogastroduodenoscopy 01/26/2011    Decompressed/abscent esophagel varices s/p TIPS. small hh. GAVE. Procedure: ESOPHAGOGASTRODUODENOSCOPY (EGD);  Surgeon: Corbin Ade, MD;  [facility];  Service: Endoscopy;  Laterality: N/A;  . Esophagogastroduodenoscopy 04/22/2011    Procedure: ESOPHAGOGASTRODUODENOSCOPY (EGD);  Surgeon: Corbin Ade, MD;  [facility];  Service: Endoscopy;  Laterality: N/A;  . Esophagogastroduodenoscopy 05/20/2011    Portal gastropathy.  GAVE - status post APC ablation  . Hot hemostasis 05/20/2011    Procedure: HOT HEMOSTASIS (ARGON PLASMA COAGULATION/BICAP);  Surgeon: Corbin Ade, MD;  [facility];  Service: Endoscopy;  Laterality: N/A;    Prior to Admission medications   Medication Sig Start Date End Date Taking? Authorizing Provider  ALPRAZolam Prudy Feeler) 0.5 MG tablet Take 0.5 mg by mouth Daily.  02/08/11  Yes Historical Provider, MD  digoxin (LANOXIN) 0.125 MG tablet Take 125 mcg by mouth daily.  01/09/11  Yes Jonelle Sidle, MD  furosemide (LASIX) 40 MG tablet Take 1 tablet (40 mg total) by mouth daily. 01/18/11  Yes Jodelle Gross, NP  gabapentin (NEURONTIN) 300 MG capsule Take 300 mg by mouth at bedtime.  05/29/10  Yes Historical Provider, MD  insulin aspart (NOVOLOG) 100 UNIT/ML injection Inject 0-20 Units into the skin 2 (two) times daily. Patient is on Sliding Scale   Yes Historical Provider, MD  insulin glargine (LANTUS) 100 UNIT/ML injection Inject 80 Units into the skin at bedtime.    Yes Historical Provider, MD  lactulose (CHRONULAC) 10 GM/15ML solution Take 20 g by mouth 4 (four) times daily.    Yes Historical Provider, MD  PROTONIX 40 MG tablet TAKE (1) TABLET BY MOUTH TWICE DAILY. 02/12/11  Yes Tiffany Kocher, PA  simvastatin (ZOCOR) 40 MG tablet Take 40 mg by mouth at bedtime.     Yes Historical Provider, MD  propranolol (INDERAL)  10 MG tablet Take 5 mg by mouth 2 (two) times daily.     Historical Provider, MD  rifaximin (XIFAXAN) 550 MG TABS Take 1 tablet (550 mg total) by mouth 2 (two) times daily. 05/27/11   Tiffany Kocher, PA  sucralfate (CARAFATE) 1 GM/10ML suspension Take 10 mLs (1 g total) by mouth 4 (four) times daily -  with meals and at bedtime. 04/24/11 05/24/11  Elliot Cousin, MD    Allergies as of 07/23/2011 - Review Complete 07/23/2011  Allergen Reaction Noted  . Codeine Other (See Comments)   . Other Rash 05/05/2010    Family History  Problem Relation Age of Onset  . Colon cancer Neg Hx   . Inflammatory bowel disease Neg Hx   . Liver disease Neg Hx   . Anesthesia problems Neg Hx   . Hypotension Neg Hx   . Malignant hyperthermia Neg Hx   . Pseudochol deficiency Neg Hx     History   Social History  . Marital Status: Divorced    Spouse Name: N/A    Number of  Children: 1  . Years of Education: N/A   Occupational History  . retired     disabled   Social History Main Topics  . Smoking status: Never Smoker   . Smokeless tobacco: Never Used  . Alcohol Use: No  . Drug Use: No  . Sexually Active: Yes    Birth Control/ Protection: None   Other Topics Concern  . Not on file   Social History Narrative  . No narrative on file    Review of Systems: See HPI, otherwise negative ROS  Physical Exam: BP 132/66  Pulse 80  Temp 98.1 F (36.7 C) (Temporal)  Ht 6\' 1"  (1.854 m)  Wt 248 lb 3.2 oz (112.583 kg)  BMI 32.75 kg/m2 General:   Alert,  chronically ill appearing gentleman. Well oriented conversant. He has a dressing on his right temporal area.. He has no asterixis.  Skin:  Intact without significant lesions or rashes. Eyes:  Sclera clear, no icterus.   Conjunctiva pink. Ears:  Normal auditory acuity. Nose:  No deformity, discharge,  or lesions. Mouth:  No deformity or lesions. Neck:  Supple; no masses or thyromegaly. No significant cervical adenopathy. Lungs:  Clear throughout to  auscultation.   No wheezes, crackles, or rhonchi. No acute distress. Heart:  Regular rate and rhythm; no murmurs, clicks, rubs,  or gallops. Abdomen: Non-distended, normal bowel sounds.  Soft and nontender. Golf ball size umbilical hernia only partially reducible. without appreciable mass or hepatosplenomegaly.  Pulses:  Normal pulses noted. Extremities:  Without clubbing or edema.  Impression/Plan: Very nice complicated 66 year old gentleman Elita Boone cirrhosis, status post TIPS for recurrent refractory GI bleeding. More recently GAVE treated times multiple sessions. He is overall doing fairly well status post TIPS placement. No gross encephalopathy today. However,  patient notes he did better with Xifaxan on top of lactulose but cost is prohibitive.  Umbilical hernia not an issue at this time but is not totally reducible. He would be at a very high risk of surgical intervention.  This gentleman has ileitis on ileocolonoscopy and histology. Previously diagnosed with Crohn's disease. [facility] felt this was not an issue and stopped his Pentasa. At this point I'm not convinced that he does not have Crohn's disease. He's not been taking any nonsteroidal  Agents.  He can easily be oozing from his terminal ileal mucosa on top of everything else. At this point, I am not going to reinstitute therapy for her Crohn's disease as his regimen is quite complicated as it is.  Recommendations: Continue lactulose. I've given him samples of Xifaxan -  he's to use them sparingly if he can get by with one a day that's better than none. Refilled Inderal. He is due for hepatoma surveillance in October with a hepatic ultrasound and alpha-fetoprotein. Plan to see him back in the office at that time.

## 2011-07-23 NOTE — Patient Instructions (Signed)
Continue your present regimen  Will try and get you some Xifaxin   Office visit with [place] in 3months

## 2011-07-30 ENCOUNTER — Encounter: Payer: Self-pay | Admitting: Urgent Care

## 2011-08-19 ENCOUNTER — Encounter (HOSPITAL_COMMUNITY): Payer: Medicare Other | Attending: Oncology

## 2011-08-19 ENCOUNTER — Encounter: Payer: Self-pay | Admitting: Internal Medicine

## 2011-08-19 DIAGNOSIS — D649 Anemia, unspecified: Secondary | ICD-10-CM | POA: Insufficient documentation

## 2011-08-19 DIAGNOSIS — D509 Iron deficiency anemia, unspecified: Secondary | ICD-10-CM | POA: Insufficient documentation

## 2011-08-19 LAB — CBC
Hemoglobin: 11.3 g/dL — ABNORMAL LOW (ref 13.0–17.0)
MCH: 30.5 pg (ref 26.0–34.0)
MCHC: 34.2 g/dL (ref 30.0–36.0)
Platelets: 109 10*3/uL — ABNORMAL LOW (ref 150–400)
RDW: 16 % — ABNORMAL HIGH (ref 11.5–15.5)

## 2011-08-19 NOTE — Progress Notes (Signed)
Labs drawn today for cbc,ferr 

## 2011-08-20 ENCOUNTER — Ambulatory Visit (INDEPENDENT_AMBULATORY_CARE_PROVIDER_SITE_OTHER): Payer: Medicare Other | Admitting: Urgent Care

## 2011-08-20 ENCOUNTER — Encounter: Payer: Self-pay | Admitting: Urgent Care

## 2011-08-20 VITALS — BP 88/52 | HR 67 | Temp 97.4°F | Ht 73.0 in | Wt 247.4 lb

## 2011-08-20 DIAGNOSIS — K7460 Unspecified cirrhosis of liver: Secondary | ICD-10-CM

## 2011-08-20 LAB — FERRITIN: Ferritin: 217 ng/mL (ref 22–322)

## 2011-08-20 MED ORDER — RIFAXIMIN 550 MG PO TABS: 550.0000 mg | ORAL_TABLET | Freq: Two times a day (BID) | ORAL | Status: DC

## 2011-08-23 NOTE — Progress Notes (Signed)
A user error has taken place: encounter opened in error, closed for administrative reasons. Pt was not due for OV, not taken off recall list after last OV seen by Dr Jena Gauss 7/19.  No problems today just routine FU on schedule. OV cancelled.

## 2011-08-24 ENCOUNTER — Encounter (HOSPITAL_BASED_OUTPATIENT_CLINIC_OR_DEPARTMENT_OTHER): Payer: Medicare Other | Admitting: Oncology

## 2011-08-24 VITALS — BP 99/64 | HR 69 | Temp 98.5°F | Resp 18 | Wt 247.5 lb

## 2011-08-24 DIAGNOSIS — D519 Vitamin B12 deficiency anemia, unspecified: Secondary | ICD-10-CM

## 2011-08-24 DIAGNOSIS — D638 Anemia in other chronic diseases classified elsewhere: Secondary | ICD-10-CM

## 2011-08-24 DIAGNOSIS — D649 Anemia, unspecified: Secondary | ICD-10-CM

## 2011-08-24 DIAGNOSIS — F329 Major depressive disorder, single episode, unspecified: Secondary | ICD-10-CM

## 2011-08-24 DIAGNOSIS — D509 Iron deficiency anemia, unspecified: Secondary | ICD-10-CM

## 2011-08-24 NOTE — Progress Notes (Signed)
Kirk Ruths, MD [address] [place] [address]  1. B12 deficiency anemia    2. Anemia  CBC with Differential, Ferritin, CBC with Differential, Ferritin  3. Iron deficiency anemia  CBC with Differential, Ferritin, CBC with Differential, Ferritin    CURRENT THERAPY: Feraheme 1020 mg every 3 months.  Last Feraheme infusion was on 07/05/2011  INTERVAL HISTORY: [name] 66 y.o. male returns for  regular  visit for followup of  Multifactorial anemia with an element of iron deficiency, as well as anemia of chronic disease secondary to his cirrhosis of the liver. He has had gastrointestinal blood losses from his varices in the past and now has GAVE status post an ablation of an arteriovenous malformation by Dr. Jena Gauss in April.    I personally reviewed and went over laboratory results with the patient.  His ferritin has risen nicely since month from Feraheme 1020 mg IV infusion.  He went from 17 to 217 in 4 weeks.  His hemoglobin rose nicely as well from 9.2 to 11.3 g/dL.  His platelets remained stable in the 107- 109 range.  His WBC is within normal limits.   [name] has been follow-up with GI (Dr. Jena Gauss).  Dr. Luvenia Starch last note questions his Crohn's disease and whether that is active again.  We will of course leave the GI issues to the specialists.  [name] is on a number of GI medications including lactulose for prevention of encephalopathy, no evidence of gross encephalopathy today.  He is also on Xifaxan.    Overall, [name] is doing well from a hematologic standpoint.  We wilol perform lab work in 6 weeks and 12 weeks.  We will infuse Feraheme as needed.  He was invited to contact us with any problems, increased bleeding, or feeling fatigued and run-down for lab work.  He does have a healing surgical site on his right temporal area where Dr. Orvan Falconer (Derm) removed a worrisome lesion.  No indication of infection.    Past Medical History  Diagnosis Date  .  Hypertension     negative stress nuclear study in 02/2007; normal coronaries in 2006; Echo in 2006-aortic sclerosis, Mild biatrial enlargement; normal LV  . Pulmonary hypertension     moderate at cath-2006  . Hyperlipidemia   . Syncope 2008    orthostatic hypotension  . Diabetes mellitus     insulin; onset in 1999; retinopathy, neuropathy and nephropathy  . Chronic atrial fibrillation     onset in 1995; anticoagulation discontinued in 2006  . Chronic kidney disease     stage III; creatinine of 1.6 in 8/07, 1.35 and 05/2008  . Crohn's disease     -lower GI bleed in 08/2002;  Pentasa stopped by Dr. Brooke Dare at [facility] ; clinical remission.  . Obesity   . NASH (nonalcoholic steatohepatitis)     cirrhosis with esophageal varices; h/o GI bleeding. EGD-2010 grade 2 esophageal varices-banded with good hemostasis; portal gastropathy, friable gastric mucosa; small hiatal hernia.  EGD during recurrent melena one week later showed 2 eschars at previously placed bands, decompressed esophageal varices grade 1-2,four columns, no bleeding stigmata; portal gastropathy, normal appearing small bowel.    . Esophageal varices     s/p band ligation multiple times, Portal Hypertensive gastropathy;  . S/P TIPS (transjugular intrahepatic portosystemic shunt)     06/2009-TIPS procedure at [facility]  . GI bleed     07/2009-admitted with recurrent upper GI bleed secondary to esophageal varices-banded x3  .  Pleural effusion, left     s/p thoracentesis-multiple  . Depression   . Anemia     Iron def requiring parenteral iron or injections in past, followed by Dr. Glenford Peers; B12 def  . Duodenal ulcer 1980s  . Nephrolithiasis     cystoscopy with stone extraction in 1990  . Ascites   . Hiatal hernia 12/02/2009  . Esophageal erosions     consistent with mild erosive reflux esophagitis  . Portal hypertensive gastropathy   . 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 Jena Gauss    has HYPERLIPIDEMIA; UMBILICAL  HERNIA; Cirrhosis of liver without mention of alcohol; Hypertension; Pulmonary hypertension; Diabetes mellitus; Chronic atrial fibrillation; Chronic kidney disease; NASH (nonalcoholic steatohepatitis); Esophageal varices; S/P TIPS (transjugular intrahepatic portosystemic shunt); Anemia; Epigastric pain; Heme positive stool; Edema of left lower extremity; Iron deficiency anemia; B12 deficiency anemia; Upper GI bleed; Melena; Blood loss anemia; Thrombocytopenia; and Chronic diarrhea on his problem list.     is allergic to codeine and other.  Mr. Handel does not currently have medications on file.  Past Surgical History  Procedure Date  . Tips procedure 09/16/09  . 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 Cholecystectomy   . Circumcision -1992  . Colonoscopy -2008    Reportedly normal, done in [place]  . Multiple thoracenteses   . Esophagogastroduodenoscopy 12/02/2009    distal esophageal erosions, small hiatal hernia  . Tips procedure 2011  . Colonoscopy 01/26/2011    adequate preparation.  normal rectum. Somewhat  diffusely friable colonic mucosa. A single 4 mm polyp in the ascending segment. couple of mucosal breaks in the distal 10 cm of terminal ileum with  a couple of erosions at the junction  the TI  and colon.. Tubular adenoma, chronic ileitis. Next TCS 01/2016.  Marland Kitchen Esophagogastroduodenoscopy 01/26/2011    Decompressed/abscent esophagel varices s/p TIPS. small hh. GAVE. Procedure: ESOPHAGOGASTRODUODENOSCOPY (EGD);  Surgeon: Corbin Ade, MD;  [facility];  Service: Endoscopy;  Laterality: N/A;  . Esophagogastroduodenoscopy 04/22/2011    GAVE.-status post APC ablation as described above  . Esophagogastroduodenoscopy 05/20/2011    Portal gastropathy.  GAVE - status post APC ablation  . Hot hemostasis 05/20/2011    Procedure: HOT HEMOSTASIS (ARGON PLASMA COAGULATION/BICAP);  Surgeon: Corbin Ade, MD;  [facility];  Service: Endoscopy;  Laterality: N/A;  . Esophagogastroduodenoscopy 07/14/2005    Persisting two columns of grade 2 esophageal varices with one cherry red spot on one of these columns distally.  Otherwise normal    Denies any headaches, dizziness, double vision, fevers, chills, night sweats, nausea, vomiting, diarrhea, constipation, chest pain, heart palpitations, shortness of breath, blood in stool, black tarry stool, urinary pain, urinary burning, urinary frequency, hematuria.   PHYSICAL EXAMINATION  ECOG PERFORMANCE STATUS: 1 - Symptomatic but completely ambulatory  Filed Vitals:   08/24/11 1028  BP: 99/64  Pulse: 69  Temp: 98.5 F (36.9 C)  Resp: 18    GENERAL:alert, no distress, smiling and chronically ill appearing SKIN: skin color, texture, turgor are normal, no rashes or significant lesions HEAD: Normocephalic, No masses, lesions, tenderness or abnormalities.  Healing surgical site at right temporal area from lesion removal by Dr. Orvan Falconer.  Healthy granulation tissue identified.  No indication of infection. EYES: normal, Conjunctiva are pink and non-injected EARS: External ears normal OROPHARYNX:lips, buccal mucosa, and tongue normal and mucous membranes are moist  NECK:  supple, trachea midline LYMPH:  not examined BREAST:not examined LUNGS: clear to auscultation  HEART: regular rate & rhythm, no murmurs, no gallops, S1 normal and S2 normal ABDOMEN:abdomen soft, non-tender and normal bowel sounds BACK: Back symmetric, no curvature. EXTREMITIES:less then 2 second capillary refill, no joint deformities, effusion, or inflammation, no skin discoloration, no cyanosis  NEURO: alert & oriented x 3 with fluent speech, no focal motor/sensory deficits, gait normal    LABORATORY DATA: Results for CHRISTIPHER, RIEGER (MRN [id number]) as of 08/24/2011 10:28  Ref. Range 06/16/2011 09:33 08/19/2011 10:11  Iron Latest Range: 42-135 ug/dL 54   UIBC Latest Range: 125-400 ug/dL  409   TIBC Latest Range: 215-435 ug/dL 811   Saturation Ratios Latest Range: 20-55 % 17 (L)   Ferritin Latest Range: 22-322 ng/mL 17 (L) 217  Folate No range found 14.1   Vitamin B-12 Latest Range: 211-911 pg/mL 320   WBC Latest Range: 4.0-10.5 K/uL 4.7 5.4  RBC Latest Range: 4.22-5.81 MIL/uL 3.17 (L) 3.71 (L)  Hemoglobin Latest Range: 13.0-17.0 g/dL 9.2 (L) 91.4 (L)  HCT Latest Range: 39.0-52.0 % 28.1 (L) 33.0 (L)  MCV Latest Range: 78.0-100.0 fL 88.6 88.9  MCH Latest Range: 26.0-34.0 pg 29.0 30.5  MCHC Latest Range: 30.0-36.0 g/dL 78.2 95.6  RDW Latest Range: 11.5-15.5 % 14.8 16.0 (H)  Platelets Latest Range: 150-400 K/uL 107 (L) 109 (L)      ASSESSMENT:  1. Multifactorial anemia with an element of iron deficiency, as well as anemia of chronic disease secondary to his cirrhosis of the liver. He has had gastrointestinal blood losses from his varices in the past and now has GAVE status post an ablation of an arteriovenous malformation by Dr. Jena Gauss in April.  2. GAVE (gastric antral vascular ectasia).  3. Obesity.  4. Hypertension.  5. Hypercholesterolemia.  6. Diabetes mellitus.  7. Depression.  8. History of Crohn disease of the small bowel.    PLAN:  1. I personally reviewed and went over laboratory results with the patient. 2. Lab work in 6 weeks and 12 weeks: CBC, ferritin 3. Return in 12 weeks for follow-up.  Will administer Feraheme 1020 mg as needed per lab work results.   All questions were answered. The patient knows to call the clinic with any problems, questions or concerns. We can certainly see the patient much sooner if necessary.  The patient and plan discussed with Glenford Peers, MD and he is in agreement with the aforementioned.  KEFALAS,THOMAS

## 2011-08-24 NOTE — Patient Instructions (Addendum)
[facility]  Discharge Instructions [name]  DOB 22-Jul-1945 CSN [id number]  MRN [id number] Dr. Glenford Peers  RECOMMENDATIONS MADE BY THE CONSULTANT AND ANY TEST RESULTS WILL BE SENT TO YOUR REFERRING DOCTOR.   EXAM FINDINGS BY MD TODAY AND SIGNS AND SYMPTOMS TO REPORT TO CLINIC OR PRIMARY MD:  Exam per T. Kefalas PA today  MEDICATIONS PRESCRIBED: none Will do iron infusions as needed  INSTRUCTIONS GIVEN AND DISCUSSED: Call us if you have increased weakness or other signs that make you think iron level may be dropping and we will check sooner. Otherwise we will check in 6 weeks and 12 weeks.  SPECIAL INSTRUCTIONS/FOLLOW-UP: 12 weeks to see Dr. Mariel Sleet   I acknowledge that I have been informed and understand all the instructions given to me and received a copy. I do not have any more questions at this time, but understand that I may call the Specialty Clinic at [facility] at [phone] during business hours should I have any further questions or need assistance in obtaining follow-up care.    __________________________________________  _____________  __________ Signature of Patient or Authorized Representative            Date                   Time    __________________________________________ Nurse's Signature

## 2011-08-25 ENCOUNTER — Other Ambulatory Visit: Payer: Self-pay | Admitting: Cardiology

## 2011-09-06 ENCOUNTER — Other Ambulatory Visit: Payer: Self-pay | Admitting: Gastroenterology

## 2011-09-30 ENCOUNTER — Encounter: Payer: Self-pay | Admitting: *Deleted

## 2011-10-05 ENCOUNTER — Other Ambulatory Visit (HOSPITAL_COMMUNITY): Payer: Medicare Other

## 2011-10-05 DIAGNOSIS — C801 Malignant (primary) neoplasm, unspecified: Secondary | ICD-10-CM

## 2011-10-05 HISTORY — PX: SKIN CANCER EXCISION: SHX779

## 2011-10-05 HISTORY — DX: Malignant (primary) neoplasm, unspecified: C80.1

## 2011-10-06 ENCOUNTER — Encounter (HOSPITAL_COMMUNITY): Payer: Medicare Other | Attending: Oncology

## 2011-10-06 DIAGNOSIS — D649 Anemia, unspecified: Secondary | ICD-10-CM

## 2011-10-06 DIAGNOSIS — D509 Iron deficiency anemia, unspecified: Secondary | ICD-10-CM

## 2011-10-06 LAB — CBC WITH DIFFERENTIAL/PLATELET
Basophils Absolute: 0 10*3/uL (ref 0.0–0.1)
Basophils Relative: 0 % (ref 0–1)
HCT: 31 % — ABNORMAL LOW (ref 39.0–52.0)
Lymphocytes Relative: 7 % — ABNORMAL LOW (ref 12–46)
MCHC: 34.8 g/dL (ref 30.0–36.0)
Monocytes Absolute: 1.1 10*3/uL — ABNORMAL HIGH (ref 0.1–1.0)
Neutro Abs: 11.5 10*3/uL — ABNORMAL HIGH (ref 1.7–7.7)
Neutrophils Relative %: 84 % — ABNORMAL HIGH (ref 43–77)
Platelets: 95 10*3/uL — ABNORMAL LOW (ref 150–400)
RDW: 14.6 % (ref 11.5–15.5)
WBC: 13.7 10*3/uL — ABNORMAL HIGH (ref 4.0–10.5)

## 2011-10-06 LAB — FERRITIN: Ferritin: 165 ng/mL (ref 22–322)

## 2011-10-06 NOTE — Progress Notes (Signed)
Labs drawn today for cbc/diff,ferr 

## 2011-11-16 ENCOUNTER — Encounter (HOSPITAL_COMMUNITY): Payer: Medicare Other | Attending: Oncology

## 2011-11-16 DIAGNOSIS — D649 Anemia, unspecified: Secondary | ICD-10-CM | POA: Insufficient documentation

## 2011-11-16 DIAGNOSIS — D509 Iron deficiency anemia, unspecified: Secondary | ICD-10-CM | POA: Insufficient documentation

## 2011-11-16 LAB — CBC WITH DIFFERENTIAL/PLATELET
Basophils Relative: 1 % (ref 0–1)
Eosinophils Absolute: 0.2 10*3/uL (ref 0.0–0.7)
Hemoglobin: 10.8 g/dL — ABNORMAL LOW (ref 13.0–17.0)
Lymphs Abs: 0.7 10*3/uL (ref 0.7–4.0)
MCH: 30.7 pg (ref 26.0–34.0)
Monocytes Relative: 9 % (ref 3–12)
Neutro Abs: 4.6 10*3/uL (ref 1.7–7.7)
Neutrophils Relative %: 76 % (ref 43–77)
Platelets: 121 10*3/uL — ABNORMAL LOW (ref 150–400)
RBC: 3.52 MIL/uL — ABNORMAL LOW (ref 4.22–5.81)
WBC: 6.1 10*3/uL (ref 4.0–10.5)

## 2011-11-16 LAB — FERRITIN: Ferritin: 106 ng/mL (ref 22–322)

## 2011-11-16 NOTE — Progress Notes (Signed)
Labs drawn today for cbc/diff,ferr 

## 2011-11-19 ENCOUNTER — Encounter (HOSPITAL_COMMUNITY): Payer: Self-pay | Admitting: Oncology

## 2011-11-19 ENCOUNTER — Encounter (HOSPITAL_BASED_OUTPATIENT_CLINIC_OR_DEPARTMENT_OTHER): Payer: Medicare Other | Admitting: Oncology

## 2011-11-19 VITALS — BP 137/71 | HR 80 | Temp 97.9°F | Resp 20 | Wt 248.0 lb

## 2011-11-19 DIAGNOSIS — K5520 Angiodysplasia of colon without hemorrhage: Secondary | ICD-10-CM

## 2011-11-19 DIAGNOSIS — K7460 Unspecified cirrhosis of liver: Secondary | ICD-10-CM

## 2011-11-19 DIAGNOSIS — D509 Iron deficiency anemia, unspecified: Secondary | ICD-10-CM

## 2011-11-19 DIAGNOSIS — D649 Anemia, unspecified: Secondary | ICD-10-CM

## 2011-11-19 NOTE — Progress Notes (Signed)
Problem #1 multifactorial anemia with an element of iron deficiency, as well as anemia chronic disease secondary to cirrhosis of the liver. He also has GI blood losses from his varices and now GAVE syndrome with an ablation of an AV malformation by Dr. Kendell Bane in April Problem #2 recent Mohs surgery for a basal cell carcinoma anterior to the right ear done at [facility] Problem #3 GAVE syndrome Problem #4 hypertension Problem #5 diabetes mellitus Problem #6 depression Problem #7 hypercholesterolemia Khaidyn actually looks very good. His wound looks like it's healing well. I did not detect any adenopathy in the neck bilaterally. Vital signs are stable. His lab work is stable. We need to check his iron stores and' 6 weeks. I need also check his B12 level since it appeared in the last note that he has "B12 deficiency". I do not remember having documentation of this. Therefore we will check this out when we draw his blood next time. I'll see him right after that.

## 2011-12-01 ENCOUNTER — Encounter: Payer: Self-pay | Admitting: Internal Medicine

## 2011-12-06 ENCOUNTER — Ambulatory Visit (INDEPENDENT_AMBULATORY_CARE_PROVIDER_SITE_OTHER): Payer: Medicare Other | Admitting: Gastroenterology

## 2011-12-06 ENCOUNTER — Encounter: Payer: Self-pay | Admitting: Gastroenterology

## 2011-12-06 VITALS — BP 111/61 | HR 73 | Temp 98.0°F | Ht 73.0 in | Wt 239.4 lb

## 2011-12-06 DIAGNOSIS — K7689 Other specified diseases of liver: Secondary | ICD-10-CM

## 2011-12-06 DIAGNOSIS — Z95828 Presence of other vascular implants and grafts: Secondary | ICD-10-CM

## 2011-12-06 DIAGNOSIS — K7581 Nonalcoholic steatohepatitis (NASH): Secondary | ICD-10-CM

## 2011-12-06 DIAGNOSIS — D509 Iron deficiency anemia, unspecified: Secondary | ICD-10-CM

## 2011-12-06 DIAGNOSIS — Z9889 Other specified postprocedural states: Secondary | ICD-10-CM

## 2011-12-06 DIAGNOSIS — D500 Iron deficiency anemia secondary to blood loss (chronic): Secondary | ICD-10-CM

## 2011-12-06 DIAGNOSIS — R109 Unspecified abdominal pain: Secondary | ICD-10-CM

## 2011-12-06 DIAGNOSIS — K7460 Unspecified cirrhosis of liver: Secondary | ICD-10-CM

## 2011-12-06 NOTE — Progress Notes (Signed)
Primary Care Physician: Kirk Ruths, MD  Primary Gastroenterologist:  Roetta Sessions, MD   Chief Complaint  Patient presents with  . Follow-up    HPI: [name] is a 66 y.o. male here for f/u of NASH cirrhosis/GI bleed/GAVE/IDA/encephalopathy s/p TIPS placement/Crohn's. Last seen 07/2011 by Dr. Jena Gauss.   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 Supposed to start XRT soon, precautionary reasons. During his w/u he reports having to undergo a bronchoscopy for abnormal chest CT but this turned out fine. Also being followed closely for elevated PSA in 7 range. Still going to [facility] liver clinic twice a year. Next appointment will be around 04/2012. States he had labs and MRI liver done in 10/2011. We have requested records.   Ran out of Xifaxan samples. While on them, he substituted one of his lactulose doses for one Xifaxan. He is now back to lactulose 2 tablespoons TID. Has 2-3 loose BMs daily. No melena, brbpr. C/O vague mid-abdominal ache after supper. No cramps or associated BMs. On pantoprazole BID but takes second dose after supper. Denies significant swelling. Overall feels mentally alert but has some memory issues at times. Does not drive.   Saw Dr. Mariel Sleet recently for f/u IDA/anemia of chronic disease. Last iron infusion was in early 2013.     Current Outpatient Prescriptions  Medication Sig Dispense Refill  . ALPRAZolam (XANAX) 0.5 MG tablet Take 0.5 mg by mouth Daily.       . furosemide (LASIX) 40 MG tablet Take 1 tablet (40 mg total) by mouth daily.  30 tablet  11  . gabapentin (NEURONTIN) 300 MG capsule Take 300 mg by mouth at bedtime.       . insulin aspart (NOVOLOG) 100 UNIT/ML injection Inject 0-20 Units into the skin 2 (two) times daily. Patient is on Sliding Scale      . insulin glargine (LANTUS) 100 UNIT/ML injection Inject 70 Units into the skin at bedtime.       Marland Kitchen lactulose (CHRONULAC) 10  GM/15ML solution Take 20 g by mouth 4 (four) times daily.       Marland Kitchen LANOXIN 0.125 MG tablet TAKE ONE TABLET DAILY.  30 each  6  . oxyCODONE (OXY IR/ROXICODONE) 5 MG immediate release tablet Take 5 mg by mouth every 6 (six) hours as needed.       . propranolol (INDERAL) 10 MG tablet Take 5 mg by mouth 2 (two) times daily.       Marland Kitchen PROTONIX 40 MG tablet TAKE (1) TABLET BY MOUTH TWICE DAILY.  60 each  3  . rifaximin (XIFAXAN) 550 MG TABS Take 1 tablet (550 mg total) by mouth 2 (two) times daily.  24 tablet  0  . simvastatin (ZOCOR) 40 MG tablet Take 40 mg by mouth at bedtime.          Allergies as of 12/06/2011 - Review Complete 12/06/2011  Allergen Reaction Noted  . Codeine Other (See Comments)   . Other Rash 05/05/2010    ROS:  General: Negative for anorexia, weight loss, fever, chills, fatigue, weakness. Weight down 9 pounds since 07/2011. But two pounds higher than in 04/2011.  ENT: Negative for hoarseness, difficulty swallowing , nasal congestion. CV: Negative for chest pain, angina, palpitations, dyspnea on exertion, peripheral edema.  Respiratory: Negative for dyspnea at rest, dyspnea on exertion, cough, sputum, wheezing.  GI: See history of present illness. GU:  Negative for dysuria, hematuria, urinary  incontinence, urinary frequency, nocturnal urination.  Endo: Negative for unusual weight change.    Physical Examination:   BP 111/61  Pulse 73  Temp 98 F (36.7 C) (Temporal)  Ht 6\' 1"  (1.854 m)  Wt 239 lb 6.4 oz (108.591 kg)  BMI 31.58 kg/m2  General: Chronically ill-appearing but looks much better than a few months ago. Color is better. No acute distress.  Eyes: No icterus. Mouth: Oropharyngeal mucosa moist and pink , no lesions erythema or exudate. Right ear, well-healed, skin grafting from Mohs surgery.  Lungs: Clear to auscultation bilaterally.  Heart: Regular rate and rhythm, no murmurs rubs or gallops.  Abdomen: Bowel sounds are normal, nontender, nondistended, no  hepatosplenomegaly or masses, no abdominal bruits, no rebound or guarding.  Golf-ball sized umbilical hernia, nontender, not completely reducible.  Extremities: 1+pitting lower extremity edema. No clubbing or deformities. Neuro: Alert and oriented x 4   Skin: Warm and dry, no jaundice.   Psych: Alert and cooperative, normal mood and affect.  Labs:  Lab Results  Component Value Date   WBC 6.1 11/16/2011   HGB 10.8* 11/16/2011   HCT 32.4* 11/16/2011   MCV 92.0 11/16/2011   PLT 121* 11/16/2011   Lab Results  Component Value Date   IRON 54 06/16/2011   TIBC 319 06/16/2011   FERRITIN 106 11/16/2011    Imaging Studies: No results found.

## 2011-12-06 NOTE — Patient Instructions (Addendum)
Please take your pantoprazole (Protonix), 30 minutes before breakfast and before your evening meal. Take Xifaxan one pill to substitute your last dose of lactulose daily. I will get copy of your most recent [facility] labs and MRI liver. Office visit in 3 months with Dr. Jena Gauss.

## 2011-12-06 NOTE — Assessment & Plan Note (Signed)
Mid-abdominal pain, vague, usually after supper. Not associated with cramps or need for BM. Denies any daytime symptoms. We will have him adjust his second dose of pantoprazole to before evening meal. Xifaxan samples providing, will substitue last lactulose dose with one capsule of Xifaxan. Will discuss pain with Dr. Jena Gauss. Further recommendations to follow.

## 2011-12-06 NOTE — Assessment & Plan Note (Signed)
Multifactorial anemia in part due to chronic blood loss, IDA, anemia of chronic disease. H/H has been stable. H/O GAVE requiring multiple treatments this year, the last one in 05/2011. No further melena noted. H/O Crohn's, active 01/2011, which could be contributing to chronic blood loss. Mesalamine therapy stopped years ago at [facility]. Dr. Jena Gauss, monitoring for now given how complicated his regimen/multiple issues are anyway. He has been holding his H/H so far.   Continue to follow-up with Dr. Mariel Sleet.

## 2011-12-06 NOTE — Assessment & Plan Note (Signed)
Patient reports that he is no longer on liver transplant list due to his h/o basal cell carcinoma. Recent hepatoma screening completed at [facility]. We have requested the records of last labs and MRI. He will continue to follow here locally and at [facility] (twice per year per patient).   Doing fairly well without obvious hepatic encephalopathy. Continue lactulose but substitue Xifaxan 550mg  for last dose of the day. We will provide samples as long as we can. Patient did not qualify for patient assistance and drug would cost him $600/month, therefore not affordable.   OV with Dr. Jena Gauss in 3 months.

## 2011-12-06 NOTE — Progress Notes (Signed)
Faxed to PCP

## 2011-12-08 NOTE — Progress Notes (Signed)
Received records from [facility] Patient had MRI abdomen with and without contrast on 11/01/2011. TIPS is patent. Patchy, heterogeneous iron deposition in the liver has developed since prior study two year ago. Cirrhotic liver. Spleen enlarged. Umbilical hernia containing fat. No evidence of extraction. No evidence of HCC.  Labs from 11/01/2011. Sodium 138, creatinine 1.61, albumin 2.9, total bilirubin 1.4, direct bilirubin 0.4, INR 1.4, white blood cell count 6200, hemoglobin 11.1, hematocrit 33.2, MCV 96, platelets 102,000.  Patient with h/o iron infusions but none recently. Ferritin 106 this month. Iron 54 in 06/2011.   Please let patient know he still needs an AFP done per Dr. Luvenia Starch recommendations. Was not done at [facility].

## 2011-12-08 NOTE — Addendum Note (Signed)
Addended by: Tiffany Kocher on: 12/08/2011 03:39 PM   Modules accepted: Orders

## 2011-12-08 NOTE — Progress Notes (Signed)
Pt aware, lab order faxed to the lab. Pt stated he would go tomorrow or Friday and have it done.

## 2011-12-11 LAB — AFP TUMOR MARKER: AFP-Tumor Marker: 1.6 ng/mL (ref 0.0–8.0)

## 2011-12-13 NOTE — Progress Notes (Signed)
Quick Note:  Please let patient know AFP normal.  How is his abdominal pain? Any better with adjustments of medications?  ______

## 2011-12-14 NOTE — Progress Notes (Signed)
Quick Note:  Keep ov in 3 months as planned.  AFP 6 months. ______

## 2011-12-15 ENCOUNTER — Other Ambulatory Visit: Payer: Self-pay | Admitting: Gastroenterology

## 2011-12-15 DIAGNOSIS — K7460 Unspecified cirrhosis of liver: Secondary | ICD-10-CM

## 2011-12-23 ENCOUNTER — Ambulatory Visit (HOSPITAL_COMMUNITY): Payer: Self-pay | Admitting: Dentistry

## 2011-12-23 ENCOUNTER — Other Ambulatory Visit (HOSPITAL_COMMUNITY): Payer: Self-pay | Admitting: Dentistry

## 2011-12-23 ENCOUNTER — Encounter (HOSPITAL_COMMUNITY): Payer: Self-pay | Admitting: Dentistry

## 2011-12-23 VITALS — BP 124/68 | HR 64 | Temp 98.2°F

## 2011-12-23 DIAGNOSIS — K0530 Chronic periodontitis, unspecified: Secondary | ICD-10-CM

## 2011-12-23 DIAGNOSIS — K036 Deposits [accretions] on teeth: Secondary | ICD-10-CM

## 2011-12-23 DIAGNOSIS — C44300 Unspecified malignant neoplasm of skin of unspecified part of face: Secondary | ICD-10-CM

## 2011-12-23 DIAGNOSIS — K083 Retained dental root: Secondary | ICD-10-CM

## 2011-12-23 DIAGNOSIS — Z0189 Encounter for other specified special examinations: Secondary | ICD-10-CM

## 2011-12-23 DIAGNOSIS — J392 Other diseases of pharynx: Secondary | ICD-10-CM

## 2011-12-23 DIAGNOSIS — M264 Malocclusion, unspecified: Secondary | ICD-10-CM

## 2011-12-23 MED ORDER — SODIUM FLUORIDE 1.1 % DT GEL: DENTAL | Status: DC

## 2011-12-23 NOTE — Patient Instructions (Signed)

## 2011-12-23 NOTE — Progress Notes (Signed)
DENTAL CONSULTATION  Date of Consultation:  12/23/2011 Patient Name:   [name] Date of Birth:   16-Aug-1945 Medical Record Number: [id number]  VITALS: BP 124/68  Pulse 64  Temp 98.2 F (36.8 C) (Oral)   CHIEF COMPLAINT: Patient referred for a preradiation therapy dental protocol evaluation.  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 Patient is status post surgical resection. Patient with anticipated postoperative radiation therapy with Dr. Thersa Salt. Patient is now seen as part of a preradiation therapy dental protocol evaluation.  Patient currently denies acute toothache, swellings, or abscesses. Patient was last seen in October of 2013 for evaluation of a lost crown on the lower left quadrant. Patient was seen by Dr. Jillyn Ledger. A dental extraction was recommended at that time, but the patient did not follow up with his dental extraction due to economic concerns.  Patient last dental cleaning was at least 2 years ago. Patient indicates that he has been seeing Dr. Oswaldo Done over the past 10 years.   PMH: Past Medical History  Diagnosis Date  . Hypertension     negative stress nuclear study in 02/2007; normal coronaries in 2006; Echo in 2006-aortic sclerosis, Mild biatrial enlargement; normal LV  . Pulmonary hypertension     moderate at cath-2006  . Hyperlipidemia   . Syncope 2008    orthostatic hypotension  . Diabetes mellitus     insulin; onset in 1999; retinopathy, neuropathy and nephropathy  . Chronic atrial fibrillation     onset in 1995; anticoagulation discontinued in 2006  . Chronic kidney disease     stage III; creatinine of 1.6 in 8/07, 1.35 and 05/2008  . Crohn's disease     -lower GI bleed in 08/2002;  Pentasa stopped by Dr. Brooke Dare at [facility] ; clinical remission.  . Obesity   . NASH (nonalcoholic steatohepatitis)     cirrhosis with esophageal varices; h/o GI bleeding. EGD-2010 grade 2 esophageal  varices-banded with good hemostasis; portal gastropathy, friable gastric mucosa; small hiatal hernia.  EGD during recurrent melena one week later showed 2 eschars at previously placed bands, decompressed esophageal varices grade 1-2,four columns, no bleeding stigmata; portal gastropathy, normal appearing small bowel.    . Esophageal varices     s/p band ligation multiple times, Portal Hypertensive gastropathy;  . S/P TIPS (transjugular intrahepatic portosystemic shunt)     06/2009-TIPS procedure at [facility]  . GI bleed     07/2009-admitted with recurrent upper GI bleed secondary to esophageal varices-banded x3  . Pleural effusion, left     s/p thoracentesis-multiple  . Depression   . Anemia     Iron def requiring parenteral iron or injections in past, followed by Dr. Glenford Peers; B12 def  . Duodenal ulcer 1980s  . Nephrolithiasis     cystoscopy with stone extraction in 1990  . Ascites   . Hiatal hernia 12/02/2009  . Esophageal erosions     consistent with mild erosive reflux esophagitis  . Portal hypertensive gastropathy   . 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 Jena Gauss  . Cancer 10/2011    PSH: Past Surgical History  Procedure Date  . Tips procedure 09/16/09  . 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 Cholecystectomy   . Circumcision -1992  . Colonoscopy -2008    Reportedly normal, done in [place]  . Multiple thoracenteses   .  Esophagogastroduodenoscopy 12/02/2009    distal esophageal erosions, small hiatal hernia  . Tips procedure 2011  . Colonoscopy 01/26/2011    RMR: adequate preparation.  normal rectum. Somewhat  diffusely friable colonic mucosa. A single 4 mm polyp in the ascending segment. couple of mucosal breaks in the distal 10 cm of terminal ileum with  a couple of erosions at the junction  the TI  and colon.. Tubular adenoma, chronic ileitis. Next TCS 01/2016.  Marland Kitchen Esophagogastroduodenoscopy  01/26/2011    RMR: Decompressed/abscent esophagel varices s/p TIPS. small hh. GAVE. Procedure: ESOPHAGOGASTRODUODENOSCOPY (EGD);  Surgeon: Corbin Ade, MD;  [facility]: [facility];  Service: Endoscopy;  Laterality: N/A;  . Esophagogastroduodenoscopy 04/22/2011    RMR: GAVE.-status post APC ablation as described above  . Esophagogastroduodenoscopy 05/20/2011    RMR: Portal gastropathy.  GAVE - status post APC ablation  . Hot hemostasis 05/20/2011    Procedure: HOT HEMOSTASIS (ARGON PLASMA COAGULATION/BICAP);  Surgeon: Corbin Ade, MD;  Location: [facility];  Service: Endoscopy;  Laterality: N/A;  . Esophagogastroduodenoscopy 07/14/2005    Persisting two columns of grade 2 esophageal varices with one cherry red spot on one of these columns distally.  Otherwise normal  . Skin cancer excision Oct 2013    large lesion rt side of face    ALLERGIES: Allergies  Allergen Reactions  . Codeine Other (See Comments)    Can't function - legs won't move,etc.  . Other Rash    Adhesive tape    MEDICATIONS: Current Outpatient Prescriptions  Medication Sig Dispense Refill  . furosemide (LASIX) 40 MG tablet Take 1 tablet (40 mg total) by mouth daily.  30 tablet  11  . gabapentin (NEURONTIN) 300 MG capsule Take 300 mg by mouth at bedtime.       . insulin aspart (NOVOLOG) 100 UNIT/ML injection Inject 0-20 Units into the skin 2 (two) times daily. Patient is on Sliding Scale      . insulin glargine (LANTUS) 100 UNIT/ML injection Inject 70 Units into the skin at bedtime.       Marland Kitchen lactulose (CHRONULAC) 10 GM/15ML solution Take 20 g by mouth 4 (four) times daily.       Marland Kitchen LANOXIN 0.125 MG tablet TAKE ONE TABLET DAILY.  30 each  6  . oxyCODONE (OXY IR/ROXICODONE) 5 MG immediate release tablet Take 5 mg by mouth every 6 (six) hours as needed.       . propranolol (INDERAL) 10 MG tablet Take 5 mg by mouth 2 (two) times daily.       Marland Kitchen PROTONIX 40 MG tablet TAKE (1) TABLET BY MOUTH TWICE DAILY.  60 each  3  .  rifaximin (XIFAXAN) 550 MG TABS Take 1 tablet (550 mg total) by mouth 2 (two) times daily.  24 tablet  0  . simvastatin (ZOCOR) 40 MG tablet Take 40 mg by mouth at bedtime.        . ALPRAZolam (XANAX) 0.5 MG tablet Take 0.5 mg by mouth Daily.         LABS: Lab Results  Component Value Date   WBC 6.1 11/16/2011   HGB 10.8* 11/16/2011   HCT 32.4* 11/16/2011   MCV 92.0 11/16/2011   PLT 121* 11/16/2011      Component Value Date/Time   NA 137 04/24/2011 0530   K 4.4 04/24/2011 0530   CL 108 04/24/2011 0530   CO2 22 04/24/2011 0530   GLUCOSE 155* 04/24/2011 0530   BUN 14 04/24/2011 0530  CREATININE 1.48* 04/24/2011 0530   CREATININE 1.46* 04/13/2011 1159   CALCIUM 8.3* 04/24/2011 0530   GFRNONAA 48* 04/24/2011 0530   GFRAA 56* 04/24/2011 0530   Lab Results  Component Value Date   INR 1.37 04/22/2011   INR 1.38 04/21/2011   INR 1.30 04/13/2011   No results found for this basename: PTT    SOCIAL HISTORY: History   Social History  . Marital Status: Divorced    Spouse Name: N/A    Number of Children: 1  . Years of Education: N/A   Occupational History  . retired     disabled   Social History Main Topics  . Smoking status: Never Smoker   . Smokeless tobacco: Never Used  . Alcohol Use: No  . Drug Use: No  . Sexually Active: Yes    Birth Control/ Protection: None   Other Topics Concern  . Not on file   Social History Narrative   The patient is divorced. The patient had one son.Patient denies ever having smoked or used smokeless tobacco products. Patient denies current use of alcohol.Mother and father are both deceased.    FAMILY HISTORY: Family History  Problem Relation Age of Onset  . Colon cancer Neg Hx   . Inflammatory bowel disease Neg Hx   . Liver disease Neg Hx   . Anesthesia problems Neg Hx   . Hypotension Neg Hx   . Malignant hyperthermia Neg Hx   . Pseudochol deficiency Neg Hx      REVIEW OF SYSTEMS: Reviewed with the patient today and included in dental  consultation record.  DENTAL HISTORY: CHIEF COMPLAINT: Patient referred for a preradiation therapy dental protocol evaluation.  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 Patient is status post surgical resection. Patient with anticipated postoperative radiation therapy with Dr. Thersa Salt. Patient is now seen as part of a preradiation therapy dental protocol evaluation.  Patient currently denies acute toothache, swellings, or abscesses. Patient was last seen in October of 2013 for evaluation of a lost crown on the lower left quadrant. Patient was seen by Dr. Jillyn Ledger. A dental extraction was recommended at that time, but the patient did not follow up with his dental extraction due to economic concerns.  Patient last dental cleaning was at least 2 years ago. Patient indicates that he has been seeing Dr. Oswaldo Done over the past 10 years.    DENTAL EXAMINATION:  GENERAL: Patient is a well-developed, well-nourished male in no acute distress. HEAD AND NECK: The right ear and face and cheek are consistent with surgical resection from cancer in this area. There is no palpable left neck lymphadenopathy. The patient denies acute TMJ symptoms. Maximum interincisal opening is measured at 60 mm. INTRAORAL EXAM: The patient has incipient xerostomia.  There is no evidence of abscess formation. DENTITION: Patient is missing tooth numbers 1, 3, 15, 17, 18, 20, and 32. Tooth #19 is present as 2 retained root segments. PERIODONTAL: The patient has chronic periodontitis with plaque and calculus accumulations, generalized gingival recession, generalized tooth mobility of the mandibular anterior teeth. DENTAL CARIES/SUBOPTIMAL RESTORATIONS: Dental caries are affecting the retained roots #19 and the distal of #4 that is close to the pulp. ENDODONTIC: Patient currently denies acute pulpitis symptoms. Retained roots in the area #19 have had previous root canal therapy  with periapical radiolucency. The patient has other previous root canal therapies with no obvious persistent periapical pathology. Dental caries on the distal of tooth #  4 may be impinging on the pulp. CROWN AND BRIDGE: Patient has multiple crown restorations that appear to be acceptable with the exception of the crown on tooth #29. This Crown has a mesial lingual cusp fracture of the porcelain. PROSTHODONTIC: No dentures OCCLUSION: Patient has a poor occlusal scheme secondary to multiple missing teeth, supra-eruption and drifting of the unopposed teeth into the edentulous areas, end-to-end occlusion and right-sided crossbite.  RADIOGRAPHIC INTERPRETATION: An orthopantogram was obtained and 4 periapical radiographs only were obtained secondary to a hyperactive gag reflex. There are multiple missing teeth. There is a retained root in the area of #19.  There is supra-eruption and drifting of the unopposed teeth into the edentulous areas. Multiple diastemas are noted. There is incipient to moderate bone loss. Dental caries are noted. There is pneumatization of the maxillary left sinus.  ASSESSMENTS: 1. Chronic periodontitis of bone loss 2. Plaque and calculus accumulations 3. Generalized gingival recession 4. Incipient tooth mobility 5. Dental caries 6. Suboptimal dental restorations 7. Retained roots in the area of #19 8. Missing teeth  9. Multiple diastemas 10. Supra-eruption and drifting of the unopposed teeth into the edentulous areas 11. Right-sided crossbite 12. End-to-End anterior occlusion 13. Malocclusion 14. Thrombocytopenia with the risk for bleeding with invasive dental procedures 15. Significant medical comorbidities with the risk for complications up to and including death with anticipated dental procedures utilizing general anesthesia.    PLAN/RECOMMENDATIONS: 1. I discussed the risks, benefits, and complications of various treatment options with the patient in relationship to  his medical and dental conditions, anticipated radiation therapy, and radiation therapy side effects to include xerostomia, radiation caries, mucositis, taste changes, gum and jawbone changes, and risk for infection bleeding and osteoradionecrosis. We discussed various treatment options to include no treatment, multiple extractions with alveoloplasty, pre-prosthetic surgery as indicated, periodontal therapy, dental restorations, root canal therapy, crown and bridge therapy, implant therapy, and replacement of missing teeth as indicated. The patient currently wishes to proceed with extraction of tooth numbers 2, 4, 19, 30, and 31 with alveoloplasty and gross debridement of remaining dentition in the operating room on 01/14/2012 at 7:15 AM at [facility].  Patient also agrees to impressions for fabrication of fluoride trays and scatter protection devices.  Patient will need to see Dr. Mariel Sleet in early January 2014 to provide history and physical for the dental OR procedure as well as to evaluate risk for bleeding with the current thrombocytopenia. Discussion of the need for replacement blood products will be performed prior to the anticipated operating room procedure.  Impressions for the fluoride trays were obtained today and a prescription was sent to [facility].  2. Discussion of findings with medical team and coordination of future medical and dental care as indicated.Charlynne Pander, DDS

## 2011-12-24 ENCOUNTER — Other Ambulatory Visit (HOSPITAL_COMMUNITY): Payer: Self-pay | Admitting: Oncology

## 2011-12-24 DIAGNOSIS — K7460 Unspecified cirrhosis of liver: Secondary | ICD-10-CM

## 2012-01-03 ENCOUNTER — Encounter (HOSPITAL_COMMUNITY): Payer: Self-pay | Admitting: Pharmacy Technician

## 2012-01-10 ENCOUNTER — Other Ambulatory Visit: Payer: Self-pay | Admitting: Gastroenterology

## 2012-01-10 ENCOUNTER — Encounter (HOSPITAL_COMMUNITY): Payer: Medicare Other | Attending: Oncology

## 2012-01-10 DIAGNOSIS — D509 Iron deficiency anemia, unspecified: Secondary | ICD-10-CM | POA: Insufficient documentation

## 2012-01-10 DIAGNOSIS — K7460 Unspecified cirrhosis of liver: Secondary | ICD-10-CM | POA: Insufficient documentation

## 2012-01-10 DIAGNOSIS — D649 Anemia, unspecified: Secondary | ICD-10-CM | POA: Insufficient documentation

## 2012-01-10 LAB — COMPREHENSIVE METABOLIC PANEL
ALT: 10 U/L (ref 0–53)
CO2: 30 mEq/L (ref 19–32)
Calcium: 9.3 mg/dL (ref 8.4–10.5)
Creatinine, Ser: 1.62 mg/dL — ABNORMAL HIGH (ref 0.50–1.35)
GFR calc Af Amer: 49 mL/min — ABNORMAL LOW (ref >90)
GFR calc non Af Amer: 43 mL/min — ABNORMAL LOW (ref >90)
Glucose, Bld: 343 mg/dL — ABNORMAL HIGH (ref 70–99)
Sodium: 136 mEq/L (ref 135–145)
Total Protein: 8.1 g/dL (ref 6.0–8.3)

## 2012-01-10 LAB — PROTIME-INR: INR: 1.25 (ref 0.00–1.49)

## 2012-01-10 LAB — FERRITIN: Ferritin: 130 ng/mL (ref 22–322)

## 2012-01-10 LAB — CBC WITH DIFFERENTIAL/PLATELET
Basophils Absolute: 0 10*3/uL (ref 0.0–0.1)
Eosinophils Absolute: 0.2 10*3/uL (ref 0.0–0.7)
Eosinophils Relative: 3 % (ref 0–5)
HCT: 37 % — ABNORMAL LOW (ref 39.0–52.0)
MCH: 31.1 pg (ref 26.0–34.0)
MCV: 89.8 fL (ref 78.0–100.0)
Monocytes Absolute: 0.7 10*3/uL (ref 0.1–1.0)
Platelets: 115 10*3/uL — ABNORMAL LOW (ref 150–400)
RDW: 15.6 % — ABNORMAL HIGH (ref 11.5–15.5)

## 2012-01-10 LAB — APTT: aPTT: 34 seconds (ref 24–37)

## 2012-01-10 LAB — VITAMIN B12: Vitamin B-12: 485 pg/mL (ref 211–911)

## 2012-01-10 NOTE — Progress Notes (Signed)
Labs drawn today for cbc/diff,Methylmalonic acid,cmp,ferr,B12,pt/ptt

## 2012-01-12 ENCOUNTER — Encounter (HOSPITAL_BASED_OUTPATIENT_CLINIC_OR_DEPARTMENT_OTHER): Payer: Medicare Other | Admitting: Oncology

## 2012-01-12 ENCOUNTER — Encounter (HOSPITAL_COMMUNITY): Payer: Self-pay | Admitting: Oncology

## 2012-01-12 VITALS — BP 93/54 | HR 137 | Temp 98.0°F | Resp 20 | Wt 238.1 lb

## 2012-01-12 DIAGNOSIS — K31819 Angiodysplasia of stomach and duodenum without bleeding: Secondary | ICD-10-CM

## 2012-01-12 DIAGNOSIS — D509 Iron deficiency anemia, unspecified: Secondary | ICD-10-CM

## 2012-01-12 DIAGNOSIS — C4491 Basal cell carcinoma of skin, unspecified: Secondary | ICD-10-CM

## 2012-01-12 DIAGNOSIS — D649 Anemia, unspecified: Secondary | ICD-10-CM

## 2012-01-12 DIAGNOSIS — K7460 Unspecified cirrhosis of liver: Secondary | ICD-10-CM

## 2012-01-12 NOTE — Patient Instructions (Addendum)
[facility] Discharge Instructions  RECOMMENDATIONS MADE BY THE CONSULTANT AND ANY TEST RESULTS WILL BE SENT TO YOUR REFERRING PHYSICIAN.  EXAM FINDINGS BY THE PHYSICIAN TODAY AND SIGNS OR SYMPTOMS TO REPORT TO CLINIC OR PRIMARY PHYSICIAN: Discussion by MD.  Blood work was good and you are ok for the dental work.  Once you start radiation, if they don't check your blood counts weekly let us know and we will do that here.  MEDICATIONS PRESCRIBED:  none  INSTRUCTIONS GIVEN AND DISCUSSED: Report increased fatigue or shortness of breath.  SPECIAL INSTRUCTIONS/FOLLOW-UP:  Return in 6 weeks for blood work and to be seen in follow-up in 3 months.  Thank you for choosing [facility] to provide your oncology and hematology care.  To afford each patient quality time with our providers, please arrive at least 15 minutes before your scheduled appointment time.  With your help, our goal is to use those 15 minutes to complete the necessary work-up to ensure our physicians have the information they need to help with your evaluation and healthcare recommendations.    Effective January 1st, 2014, we ask that you re-schedule your appointment with our physicians should you arrive 10 or more minutes late for your appointment.  We strive to give you quality time with our providers, and arriving late affects you and other patients whose appointments are after yours.    Again, thank you for choosing [facility].  Our hope is that these requests will decrease the amount of time that you wait before being seen by our physicians.       _____________________________________________________________  Should you have questions after your visit to [facility], please contact our office at [phone] between the hours of 8:30 a.m. and 5:00 p.m.  Voicemails left after 4:30 p.m. will not be returned until the following business day.  For prescription refill  requests, have your pharmacy contact our office with your prescription refill request.

## 2012-01-12 NOTE — Progress Notes (Signed)
1.Multifactorial anemia-stable with nice ferritin levels and B12 levels with Methyllmalonic acid level OK as well 2.Basal cell ca of R face s/p surgery with need for post-op xrt they have determined to start after teeth removal by Dr Kristin Bruins.  Dr Thersa Salt will be treating him and he should probably have weekly CBCs during therapy due to his health problems 3.GAVE syndrom 4.DM 5.Depression-much improved 6.cirrhosis of liver with splenomegaly, varices causing blood losses and iron deficiency along with his GAVE losses 7.Hypercholesterolemia His labs are very stable.  He is to start XRT soon,but after his teeth are removed.  We will therefore check his counts in February along with ferritin levels because of this new stress and potential for blood loss from his upcoming mouth surgery.

## 2012-01-13 ENCOUNTER — Encounter (HOSPITAL_COMMUNITY): Payer: Self-pay | Admitting: Vascular Surgery

## 2012-01-13 ENCOUNTER — Encounter (HOSPITAL_COMMUNITY): Payer: Self-pay

## 2012-01-13 ENCOUNTER — Encounter (HOSPITAL_COMMUNITY)
Admission: RE | Admit: 2012-01-13 | Discharge: 2012-01-13 | Disposition: A | Discharge status: Home / Self Care | Payer: Medicare Other | Source: Ambulatory Visit | Attending: Dentistry | Admitting: Dentistry

## 2012-01-13 HISTORY — DX: Pneumonia, unspecified organism: J18.9

## 2012-01-13 HISTORY — DX: Shortness of breath: R06.02

## 2012-01-13 HISTORY — DX: Gastro-esophageal reflux disease without esophagitis: K21.9

## 2012-01-13 HISTORY — DX: Unspecified osteoarthritis, unspecified site: M19.90

## 2012-01-13 LAB — GLUCOSE, CAPILLARY: Glucose-Capillary: 393 mg/dL — ABNORMAL HIGH (ref 70–99)

## 2012-01-13 MED ORDER — CEFAZOLIN SODIUM-DEXTROSE 2-3 GM-% IV SOLR
2.0000 g | Freq: Once | INTRAVENOUS | Status: AC
  Administered 2012-01-14: 2 g via INTRAVENOUS
  Filled 2012-01-13: qty 50

## 2012-01-13 NOTE — Progress Notes (Signed)
Forwarded EKG to anesthesia for review.  

## 2012-01-13 NOTE — Pre-Procedure Instructions (Signed)
[name]  01/13/2012   Your procedure is scheduled on:  Friday, January 10th  Report to [facility] at 0530 AM.  Call this number if you have problems the morning of surgery: [phone]   Remember:   Do not eat food or drink liquids after midnight.   Take these medicines the morning of surgery with A SIP OF WATER: xanax, lanoxin, protonix, propranolol,   Do not wear jewelry, make-up or nail polish.  Do not wear lotions, powders, or perfumes. You may not wear deodorant.  Do not shave 48 hours prior to surgery. Men may shave face and neck.  Do not bring valuables to the hospital.  Contacts, dentures or bridgework may not be worn into surgery.  Leave suitcase in the car. After surgery it may be brought to your room.  For patients admitted to the hospital, checkout time is 11:00 AM the day of  discharge.   Patients discharged the day of surgery will not be allowed to drive  home.   Special Instructions: Shower using CHG 2 nights before surgery and the night before surgery.  If you shower the day of surgery use CHG.  Use special wash - you have one bottle of CHG for all showers.  You should use approximately 1/3 of the bottle for each shower.   Please read over the following fact sheets that you were given: Pain Booklet, Coughing and Deep Breathing and Surgical Site Infection Prevention

## 2012-01-13 NOTE — Progress Notes (Signed)
Call to A. Zelenak,PAC- to report order for anesth. Consult.

## 2012-01-13 NOTE — Progress Notes (Signed)
Per request of A. Zelenak,PAC, CBG done- found to be 393.  Pt. Reports that he has only had one glass of OJ today.

## 2012-01-13 NOTE — Consult Note (Addendum)
Anesthesia chart review: Patient is a 67 year old male scheduled for multiple teeth extraction with alveoloplasty with gross debridement of remaining teeth by Dr. Kristin Bruins on 01/14/2012. 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 Thersa Salt.  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  Of note, around October 2013 he was found to have an abnormality the left lung and superior mediastinum and underwent transbronchial biopsy of the left lower lobe on 11/12/2011 with pathology revealing only benign tissue.  PCP is Dr. Karleen Hampshire. Hem-Onc is Dr. Mariel Sleet who is aware of planned procedure.  GI is Dr. Jena Gauss.  Cardiologist is Dr. Dietrich Pates, with last visit with Joni Reining, NP on 01/18/11 and by notes appeared to be stable from a cardiac standpoint.  EKG on 01/13/12 showed afib @ 75 bpm, ST/T wave abnormality, consider ischemia.  Overall, I think it is stable since 10/18/09.    Echo on 10/12/10  [facility]) showed chronic liver disease, left ventricular enlargement, normal left ventricular contractile function with estimated EF greater than 55%, mitral annular calcification with trivial MR, degenerative mitral valve disease, dilated left atrium, aortic sclerosis with mildly limited excursion (no evidence of hemodynamically important aortic transvalvular gradient; maximal instantaneous LV outflow  velocity is 1.8 m/s), right ventricular enlargement, dilated right atrium, trivial pulmonic and tricuspid regurgitation, no evidence of pericardial effusion, negative contrast study.  Nuclear stress test on 09/29/09 [facility]) showed normal study, with no evidence of ischemia or infarction, no significant change since 09/12/08.  Global systolic function is normal.  EF 58% at rest and 61% post-stress.  The right ventricular chamber size is moderately dilated, with mild RV hypertrophy appreciated. The right ventricular systolic function is mildly hypokinetic. Compared to the prior study, these findings are essentially unchanged.  Cardiac cath on 04/12/2008 showed no significant coronary artery disease, mild pulmonary hypertension with normal PVR, normal pulmonary capillary wedge pressure, normal left ventricular end-diastolic pressure.  Labs on 01/10/12 showed Cr 1.62, glucose 343, normal AST/ALT, elevated total bilirubin of 1.8.  H/H 12.8/37.0, PLT 115. PT/INR 15.5/1.25, PTT 34.  Non-fasting CBG checked today, and it was 393.  CXR on 01/13/12 showed Left basilar airspace opacification and left pleural effusion appear unchanged.   I evaluated patient during his PAT visit.  He denies chest pain or SOB at rest.  He does have chronic DOE which he feels is stable.  He can walk around his home without SOB.  He has not required a paracentesis or thoracentesis since 2011. He does have mild chronic lower extremity edema which he feels is stable. He tries to be compliant with his diabetic regimen, but admits to sometimes forgetting to take a dose of insulin.  He did remember to take his insulin last night.  He reports that his fasting glucose levels are usually ~ 170-180.  He does not check his glucose every day though.  Exam show a pleasant male in a wheelchair, somewhat flat affect. No conversational dyspnea.  Heart irregularly irregular, I-II/VI SEM. Lungs with diminished bases, no  wheezes or rhonchi. Mild LE edema (1+).    Patient has multiple medical issues, most of which appear at his baseline.  He has tolerated at least two operative procedure within the past six months, last in November 2013.  His EKG appears stable and he denies chest pain and new SOB.  He had no significant CAD by cath in 2010.  His chest xray is stable.  Overall, his labs appear as expected considering his health history with the exception of his severe hyperglycemia.  I talked with both Anesthesiologist Dr. Chaney Malling and Dr. Kristin Bruins.  Patient is scheduled as Dr. Luretha Murphy first and only case.  He would prefer general anesthesia, but will talk with the assigned anesthesiologist tomorrow.  At this point, patient was instructed to take his insulin as usual this evening.  He will stay NPO after midnight except for meds with sips of water in the morning.  He will get a CBG on arrival.  If less than 250 then would anticipate he can proceed.  If his sugar is > 250 then it will be at the discretion of Dr. Kristin Bruins and patient's anesthesiologist.  If his sugars remain significantly elevated tomorrow then case will likely be cancelled and rescheduled once his DM is better controlled.  Patient is aware of this.    His H&P from Dr. Lance Bosch from 12/31/11 is on his chart with additional notes by Dr. Kristin Bruins and Dr. Mariel Sleet in [place].    Shonna Chock, PA-C 01/13/12 1545

## 2012-01-14 ENCOUNTER — Encounter (HOSPITAL_COMMUNITY): Payer: Self-pay | Admitting: Vascular Surgery

## 2012-01-14 ENCOUNTER — Encounter (HOSPITAL_COMMUNITY)
Admission: RE | Disposition: A | Discharge status: Home / Self Care | Payer: Self-pay | Source: Ambulatory Visit | Attending: Dentistry

## 2012-01-14 ENCOUNTER — Ambulatory Visit (HOSPITAL_COMMUNITY)
Admission: RE | Admit: 2012-01-14 | Discharge: 2012-01-14 | Disposition: A | Discharge status: Home / Self Care | Payer: Medicare Other | Source: Ambulatory Visit | Attending: Dentistry | Admitting: Dentistry

## 2012-01-14 ENCOUNTER — Ambulatory Visit (HOSPITAL_COMMUNITY): Payer: Medicare Other | Admitting: Vascular Surgery

## 2012-01-14 DIAGNOSIS — I499 Cardiac arrhythmia, unspecified: Secondary | ICD-10-CM | POA: Insufficient documentation

## 2012-01-14 DIAGNOSIS — K029 Dental caries, unspecified: Secondary | ICD-10-CM | POA: Insufficient documentation

## 2012-01-14 DIAGNOSIS — K083 Retained dental root: Secondary | ICD-10-CM | POA: Diagnosis present

## 2012-01-14 DIAGNOSIS — K045 Chronic apical periodontitis: Secondary | ICD-10-CM | POA: Diagnosis present

## 2012-01-14 DIAGNOSIS — K0530 Chronic periodontitis, unspecified: Secondary | ICD-10-CM

## 2012-01-14 DIAGNOSIS — F329 Major depressive disorder, single episode, unspecified: Secondary | ICD-10-CM | POA: Insufficient documentation

## 2012-01-14 DIAGNOSIS — K036 Deposits [accretions] on teeth: Secondary | ICD-10-CM | POA: Diagnosis present

## 2012-01-14 DIAGNOSIS — I10 Essential (primary) hypertension: Secondary | ICD-10-CM | POA: Insufficient documentation

## 2012-01-14 DIAGNOSIS — F3289 Other specified depressive episodes: Secondary | ICD-10-CM | POA: Insufficient documentation

## 2012-01-14 DIAGNOSIS — E119 Type 2 diabetes mellitus without complications: Secondary | ICD-10-CM | POA: Insufficient documentation

## 2012-01-14 DIAGNOSIS — C44329 Squamous cell carcinoma of skin of other parts of face: Secondary | ICD-10-CM

## 2012-01-14 DIAGNOSIS — N289 Disorder of kidney and ureter, unspecified: Secondary | ICD-10-CM | POA: Insufficient documentation

## 2012-01-14 DIAGNOSIS — Z8711 Personal history of peptic ulcer disease: Secondary | ICD-10-CM | POA: Insufficient documentation

## 2012-01-14 DIAGNOSIS — K219 Gastro-esophageal reflux disease without esophagitis: Secondary | ICD-10-CM | POA: Insufficient documentation

## 2012-01-14 HISTORY — PX: MULTIPLE EXTRACTIONS WITH ALVEOLOPLASTY: SHX5342

## 2012-01-14 LAB — GLUCOSE, CAPILLARY: Glucose-Capillary: 195 mg/dL — ABNORMAL HIGH (ref 70–99)

## 2012-01-14 SURGERY — MULTIPLE EXTRACTION WITH ALVEOLOPLASTY
Anesthesia: General | Site: Mouth | Wound class: Clean Contaminated

## 2012-01-14 MED ORDER — ONDANSETRON HCL 4 MG/2ML IJ SOLN: 4.0000 mg | Freq: Four times a day (QID) | INTRAMUSCULAR | Status: DC | PRN

## 2012-01-14 MED ORDER — GLYCOPYRROLATE 0.2 MG/ML IJ SOLN
INTRAMUSCULAR | Status: DC | PRN
  Administered 2012-01-14: 0.6 mg via INTRAVENOUS

## 2012-01-14 MED ORDER — ROCURONIUM BROMIDE 100 MG/10ML IV SOLN
INTRAVENOUS | Status: DC | PRN
  Administered 2012-01-14: 30 mg via INTRAVENOUS

## 2012-01-14 MED ORDER — 0.9 % SODIUM CHLORIDE (POUR BTL) OPTIME
TOPICAL | Status: DC | PRN
  Administered 2012-01-14: 1000 mL

## 2012-01-14 MED ORDER — HEMOSTATIC AGENTS (NO CHARGE) OPTIME
TOPICAL | Status: DC | PRN
  Administered 2012-01-14: 1 via TOPICAL

## 2012-01-14 MED ORDER — LIDOCAINE-EPINEPHRINE 2 %-1:100000 IJ SOLN
INTRAMUSCULAR | Status: AC
  Filled 2012-01-14: qty 6.8

## 2012-01-14 MED ORDER — ONDANSETRON HCL 4 MG/2ML IJ SOLN
INTRAMUSCULAR | Status: DC | PRN
  Administered 2012-01-14: 4 mg via INTRAVENOUS

## 2012-01-14 MED ORDER — LIDOCAINE HCL (CARDIAC) 20 MG/ML IV SOLN
INTRAVENOUS | Status: DC | PRN
  Administered 2012-01-14: 50 mg via INTRAVENOUS

## 2012-01-14 MED ORDER — BUPIVACAINE-EPINEPHRINE 0.5% -1:200000 IJ SOLN
INTRAMUSCULAR | Status: DC | PRN
  Administered 2012-01-14 (×2): 1.8 mL

## 2012-01-14 MED ORDER — ETOMIDATE 2 MG/ML IV SOLN
INTRAVENOUS | Status: DC | PRN
  Administered 2012-01-14: 16 mg via INTRAVENOUS

## 2012-01-14 MED ORDER — OXYMETAZOLINE HCL 0.05 % NA SOLN
NASAL | Status: AC
  Filled 2012-01-14: qty 15

## 2012-01-14 MED ORDER — LIDOCAINE-EPINEPHRINE 2 %-1:100000 IJ SOLN
INTRAMUSCULAR | Status: DC | PRN
  Administered 2012-01-14 (×3): 1.7 mL

## 2012-01-14 MED ORDER — BUPIVACAINE-EPINEPHRINE (PF) 0.5% -1:200000 IJ SOLN
INTRAMUSCULAR | Status: AC
  Filled 2012-01-14: qty 7.2

## 2012-01-14 MED ORDER — FENTANYL CITRATE 0.05 MG/ML IJ SOLN: 25.0000 ug | INTRAMUSCULAR | Status: DC | PRN

## 2012-01-14 MED ORDER — NEOSTIGMINE METHYLSULFATE 1 MG/ML IJ SOLN
INTRAMUSCULAR | Status: DC | PRN
  Administered 2012-01-14: 3 mg via INTRAVENOUS

## 2012-01-14 MED ORDER — LACTATED RINGERS IV SOLN
INTRAVENOUS | Status: DC | PRN
  Administered 2012-01-14: 07:00:00 via INTRAVENOUS

## 2012-01-14 MED ORDER — AMINOCAPROIC ACID SOLUTION 5% (50 MG/ML)
10.0000 mL | ORAL | Status: DC
  Filled 2012-01-14 (×2): qty 100

## 2012-01-14 MED ORDER — FENTANYL CITRATE 0.05 MG/ML IJ SOLN
INTRAMUSCULAR | Status: DC | PRN
  Administered 2012-01-14: 50 ug via INTRAVENOUS

## 2012-01-14 SURGICAL SUPPLY — 36 items
ALCOHOL 70% 16 OZ (MISCELLANEOUS) ×2 IMPLANT
ATTRACTOMAT 16X20 MAGNETIC DRP (DRAPES) ×2 IMPLANT
BLADE SURG 15 STRL LF DISP TIS (BLADE) ×2 IMPLANT
BLADE SURG 15 STRL SS (BLADE) ×4
CLOTH BEACON ORANGE TIMEOUT ST (SAFETY) ×2 IMPLANT
COVER SURGICAL LIGHT HANDLE (MISCELLANEOUS) ×2 IMPLANT
CRADLE DONUT ADULT HEAD (MISCELLANEOUS) ×2 IMPLANT
GAUZE PACKING FOLDED 2  STR (GAUZE/BANDAGES/DRESSINGS) ×1
GAUZE PACKING FOLDED 2 STR (GAUZE/BANDAGES/DRESSINGS) ×1 IMPLANT
GAUZE SPONGE 4X4 16PLY XRAY LF (GAUZE/BANDAGES/DRESSINGS) ×2 IMPLANT
GLOVE SURG ORTHO 8.0 STRL STRW (GLOVE) ×2 IMPLANT
GLOVE SURG SS PI 6.5 STRL IVOR (GLOVE) ×2 IMPLANT
GOWN STRL REIN 3XL LVL4 (GOWN DISPOSABLE) ×2 IMPLANT
HEMOSTAT SURGICEL .5X2 ABSORB (HEMOSTASIS) IMPLANT
KIT BASIN OR (CUSTOM PROCEDURE TRAY) ×2 IMPLANT
KIT ROOM TURNOVER OR (KITS) ×2 IMPLANT
MANIFOLD NEPTUNE WASTE (CANNULA) ×2 IMPLANT
NDL BLUNT 16X1.5 OR ONLY (NEEDLE) ×1 IMPLANT
NDL DENTAL 27 LONG (NEEDLE) IMPLANT
NEEDLE BLUNT 16X1.5 OR ONLY (NEEDLE) ×2 IMPLANT
NEEDLE DENTAL 27 LONG (NEEDLE) IMPLANT
NS IRRIG 1000ML POUR BTL (IV SOLUTION) ×2 IMPLANT
PACK EENT II TURBAN DRAPE (CUSTOM PROCEDURE TRAY) ×2 IMPLANT
PAD ARMBOARD 7.5X6 YLW CONV (MISCELLANEOUS) ×4 IMPLANT
SPONGE SURGIFOAM ABS GEL 100 (HEMOSTASIS) IMPLANT
SPONGE SURGIFOAM ABS GEL 12-7 (HEMOSTASIS) IMPLANT
SPONGE SURGIFOAM ABS GEL SZ50 (HEMOSTASIS) IMPLANT
SUCTION FRAZIER TIP 10 FR DISP (SUCTIONS) ×2 IMPLANT
SUT CHROMIC 3 0 PS 2 (SUTURE) ×4 IMPLANT
SUT CHROMIC 4 0 P 3 18 (SUTURE) IMPLANT
SYR 50ML SLIP (SYRINGE) ×2 IMPLANT
TOWEL OR 17X24 6PK STRL BLUE (TOWEL DISPOSABLE) ×2 IMPLANT
TOWEL OR 17X26 10 PK STRL BLUE (TOWEL DISPOSABLE) ×2 IMPLANT
TUBE CONNECTING 12X1/4 (SUCTIONS) ×2 IMPLANT
WATER STERILE IRR 1000ML POUR (IV SOLUTION) ×2 IMPLANT
YANKAUER SUCT BULB TIP NO VENT (SUCTIONS) ×2 IMPLANT

## 2012-01-14 NOTE — Progress Notes (Signed)
1000 am Amicar oral rinse done with 10 ml and placed 4x4  gauze on both sides of mouth.

## 2012-01-14 NOTE — H&P (Signed)
01/14/2012  Hand P provided by Dr. Thersa Salt dated 12/31/11 and is attached to chart. Patient denies any acute changes in his medical or dental health. Elevated glucose as previously discussed in Allision Zelenak's and my recent notes. Dr. Mariel Sleet also saw patient on 01/12/12 with note in [facility] See below.               Progress Notes [name] (MR# [id number])         Progress Notes Info       Author  Note Status  Last Update User  Last Update Date/Time    Randall An, MD  Signed  Randall An, MD  01/12/2012 1:02 PM          Progress Notes     1.Multifactorial anemia-stable with nice ferritin levels and B12 levels with Methyllmalonic acid level OK as well  2.Basal cell ca of R face s/p surgery with need for post-op xrt they have determined to start after teeth removal by Dr Kristin Bruins. Dr Thersa Salt will be treating him and he should probably have weekly CBCs during therapy due to his health problems  3.GAVE syndrom  4.DM  5.Depression-much improved  6.cirrhosis of liver with splenomegaly, varices causing blood losses and iron deficiency along with his GAVE losses  7.Hypercholesterolemia  His labs are very stable. He is to start XRT soon,but after his teeth are removed. We will therefore check his counts in February along with ferritin levels because of this new stress and potential for blood loss from his upcoming mouth surgery.

## 2012-01-14 NOTE — Anesthesia Preprocedure Evaluation (Signed)
Anesthesia Evaluation  Patient identified by MRN, date of birth, ID band Patient awake    Reviewed: Allergy & Precautions, H&P , NPO status , Patient's Chart, lab work & pertinent test results  Airway Mallampati: II  Neck ROM: full    Dental   Pulmonary shortness of breath,  Pleural effusion         Cardiovascular hypertension, + dysrhythmias     Neuro/Psych Depression  Neuromuscular disease    GI/Hepatic hiatal hernia, PUD, GERD-  ,(+) Cirrhosis -  Esophageal Varices and ascites     , Hepatitis -  Endo/Other  diabetes, Type 2  Renal/GU Renal InsufficiencyRenal disease     Musculoskeletal   Abdominal   Peds  Hematology   Anesthesia Other Findings   Reproductive/Obstetrics                           Anesthesia Physical Anesthesia Plan  ASA: III  Anesthesia Plan: General   Post-op Pain Management:    Induction: Intravenous  Airway Management Planned: Oral ETT  Additional Equipment:   Intra-op Plan:   Post-operative Plan: Extubation in OR  Informed Consent: I have reviewed the patients History and Physical, chart, labs and discussed the procedure including the risks, benefits and alternatives for the proposed anesthesia with the patient or authorized representative who has indicated his/her understanding and acceptance.     Plan Discussed with: CRNA and Surgeon  Anesthesia Plan Comments:         Anesthesia Quick Evaluation

## 2012-01-14 NOTE — Anesthesia Postprocedure Evaluation (Signed)
Anesthesia Post Note  Patient: [name]  Procedure(s) Performed: Procedure(s) (LRB): MULTIPLE EXTRACION WITH ALVEOLOPLASTY (N/A)  Anesthesia type: General  Patient location: PACU  Post pain: Pain level controlled and Adequate analgesia  Post assessment: Post-op Vital signs reviewed, Patient's Cardiovascular Status Stable, Respiratory Function Stable, Patent Airway and Pain level controlled  Last Vitals:  Filed Vitals:   01/14/12 0856  BP:   Pulse:   Temp: 36.4 C  Resp:     Post vital signs: Reviewed and stable  Level of consciousness: awake, alert  and oriented  Complications: No apparent anesthesia complications

## 2012-01-14 NOTE — Preoperative (Signed)
Beta Blockers   Reason not to administer Beta Blockers:Not Applicable 

## 2012-01-14 NOTE — Op Note (Signed)
Patient:            [name] Date of Birth:  08/18/1945 MRN:                [id number]   DATE OF PROCEDURE:  01/14/2012               OPERATIVE REPORT   PREOPERATIVE DIAGNOSES: 1. Cancer of the right cheek status post resection with reconstruction 2. Preradiation therapy dental protocol 3. Chronic apical periodontitis 4. Chronic periodontitis 5. Multiple retained root segments 6. Accretions  POSTOPERATIVE DIAGNOSES: 1. Cancer of the right cheek status post resection with reconstruction 2. Preradiation therapy dental protocol 3. Chronic apical periodontitis 4. Chronic periodontitis 5. Multiple retained root segments 6. Accretions   OPERATIONS: 1. Multiple extraction of tooth numbers 2, 4, 19, 30, 31 2. 3 Quadrants of alveoloplasty 3. Gross debridement of remaining dentition   SURGEON: Charlynne Pander, DDS  ASSISTANT: Zettie Pho, (dental assistant)  ANESTHESIA: General anesthesia via oral endotracheal tube.  MEDICATIONS: 1. Ancef 2 g IV prior to invasive dental procedures. 2. Local anesthesia with a total utilization of 3 carpules each containing 34 mg of lidocaine with 0.017 mg of epinephrine as well as 2 carpules each containing 9 mg of bupivacaine with 0.009 mg of epinephrine.  SPECIMENS: There are 5 teeth that were discarded.  DRAINS: None  CULTURES: None  COMPLICATIONS: None   ESTIMATED BLOOD LOSS: 50 mLs.  INTRAVENOUS FLUIDS: 800 mLs of Lactated ringers solution.  INDICATIONS: The patient was recently diagnosed with cancer of the right cheek status post surgical resection with reconstruction.  A dental consultation was then requested as part of a preradiation therapy dental protocol evaluation.  The patient was examined and treatment planned for multiple extractions with alveoloplasty and gross debridement of remaining dentition.  This treatment plan was formulated to decrease the risks and complications associated with dental infection  from affecting the patient's systemic health and to prevent future consultation such as osteoradionecrosis.  OPERATIVE FINDINGS: Patient was examined operating room number 8.  The teeth were identified for extraction. The patient was noted be affected by chronic periodontitis, chronic apical periodontitis, multiple retained root segments, accretions, and dental caries.   DESCRIPTION OF PROCEDURE: Patient was brought to the main operating room number 8. Patient was then placed in the supine position on the operating table. General Anesthesia was then induced per the anesthesia team. The patient was then prepped and draped in the usual manner for dental medicine procedure. A timeout was performed. The patient was identified and procedures were verified. A throat pack was placed at this time. The oral cavity was then thoroughly examined with the findings noted above. The patient was then ready for dental medicine procedure as follows:  Local anesthesia was then administered sequentially with a total utilization of 3 carpules each containing 34 mg of lidocaine with 0.017 mg of epinephrine as well as  two carpules  each containing 9 mg bupivacaine with 0.009 mg of epinephrine.  The Maxillary right quadrant was first approached. Anesthesia was then delivered utilizing infiltration with lidocaine with epinephrine. A #15 blade incision was then made from the maxillary right tuberosity and extended to the distal of #5.  A  surgical flap was then carefully reflected. Appropriate amounts of buccal and interseptal bone were then removed utilizing a surgical handpiece and bur and copious amounts of sterile water.  The teeth were then subluxated with a series of straight elevators. Tooth numbers 2 and 4 were  then removed with a 150 forceps without complications. Alveoloplasty was then performed utilizing a ronguers and bone file. The surgical site was then irrigated with copious amounts of sterile saline. The tissues  were approximated and trimmed appropriately. A piece of Surgifoam was placed in the extraction socket appropriately.The surgical site was then closed from the maxillary right tuberosity and extended to the mesial of #4 utilizing 3-0 chromic gut suture in a continuous interrupted suture technique x1.  At this point time, the mandibular quadrants were approached. The patient was given bilateral inferior alveolar nerve blocks and long buccal nerve blocks utilizing the bupivacaine with epinephrine. Further infiltration was then achieved utilizing the lidocaine with epinephrine. A 15 blade incision was then made from the distal of number 32 and extended to the mesial of #29 .  A surgical flap was then carefully reflected. Appropriate amounts of buccal and interseptal bone were then removed appropriately. Tooth numbers 30 and 31 were then removed utilizing a 23 forceps. The remaining root segments were then resubluxated and removed with a 151 forceps without further complications.  Alveoloplasty was then performed utilizing a rongeurs and bone file. The tissues were approximated and trimmed appropriately. The surgical sites were then irrigated with copious amounts of sterile saline. A piece of Surgifoam was placed in the extraction socket appropriately. The surgical site was then closed from the distal of #32 and extended to the distal of #29 utilizing 3-0 chromic gut suture in a continuous interrupted suture technique x1 .  A 15 blade incision was then made from the distal of number 18 and extended to the mesial of #20 .  A surgical flap was then carefully reflected. Appropriate amounts of buccal and interseptal bone were then removed appropriately. Retained roots in the area tooth #19 were then removed utilizing a 151 forceps without complications. Alveoloplasty was then performed utilizing a rongeurs and bone file. The tissues were approximated and trimmed appropriately. The surgical sites were then irrigated with  copious amounts of sterile saline. A piece of Surgifoam was placed in the extraction socket appropriately. The surgical site was then closed from the distal of #18 and extended to the distal of #20 utilizing 3-0 chromic gut suture in a continuous interrupted suture technique x1 .  At this point time the remaining dentition was approached. A sonic scaler was used to remove significant accretions. A series of hand curettes were then utilized refine removal of accretions. A sonic scaler was then again used to further refine removal of accretions as needed. This completed the gross debridement procedure.  At this point time, the entire mouth was irrigated with copious amounts of sterile saline. The patient was exam for complications, seeing none, the dental medicine procedure was deemed to be complete. The throat pack was removed at this time. A series of 4 x 4 gauze were placed in the mouth to aid hemostasis. The patient was then handed over to the anesthesia team for final disposition. After an appropriate amount of time, the patient was extubated and taken to the postanesthsia care unit with stable vital signs and a good condition. All counts were correct for the dental medicine procedure. The patient will use Amicar rinse 5% as a rinse every hour for the next 10 hours to assist in maintenance of the hemostasis.   Charlynne Pander, DDS.

## 2012-01-14 NOTE — Transfer of Care (Signed)
Immediate Anesthesia Transfer of Care Note  Patient: [name]  Procedure(s) Performed: Procedure(s) (LRB) with comments: MULTIPLE EXTRACION WITH ALVEOLOPLASTY (N/A) - Extraction of tooth #'s [id number] with alveoloplasty and gross debridement of remaining teeth  Patient Location: PACU  Anesthesia Type:General  Level of Consciousness: awake  Airway & Oxygen Therapy: Patient Spontanous Breathing and Patient connected to nasal cannula oxygen  Post-op Assessment: Report given to PACU RN and Post -op Vital signs reviewed and stable  Post vital signs: Reviewed and stable  Complications: No apparent anesthesia complications

## 2012-01-14 NOTE — Progress Notes (Signed)
PRE-OPERATIVE NOTE:  01/14/2012 [name] [id number]  VITALS: BP 118/48  Pulse 58  Temp 97.9 F (36.6 C) (Oral)  Resp 18  SpO2 100%  Lab Results  Component Value Date   WBC 8.0 01/10/2012   HGB 12.8* 01/10/2012   HCT 37.0* 01/10/2012   MCV 89.8 01/10/2012   PLT 115* 01/10/2012   BMET    Component Value Date/Time   NA 136 01/10/2012 1111   K 4.6 01/10/2012 1111   CL 99 01/10/2012 1111   CO2 30 01/10/2012 1111   GLUCOSE 343* 01/10/2012 1111   BUN 20 01/10/2012 1111   CREATININE 1.62* 01/10/2012 1111   CREATININE 1.46* 04/13/2011 1159   CALCIUM 9.3 01/10/2012 1111   GFRNONAA 43* 01/10/2012 1111   GFRAA 49* 01/10/2012 1111    Lab Results  Component Value Date   INR 1.25 01/10/2012   INR 1.37 04/22/2011   INR 1.38 04/21/2011   No results found for this basename: PTT     [name] presents for dental procedures in the operating room.  The patient has history of skin cancer to right face and is S/P resection with reconstruction. Patient with anticipated radiation therapy with Dr. Thersa Salt after adequate healing from extractions.   SUBJECTIVE: The patient denies any acute medical or dental changes and agrees to proceed with treatment as planned. The patient denies any significant shortness of breath recently.  Glucose levels have been eleavted and pateint indicates that he has been using his insulin as directed but may have missed some doses in the past but none recently.  Glucose level was 195 this morning. EXAM: No sign of acute dental changes.  ASSESSMENT: Patient is affected by chronic periodontitis, accretions, retained roots, and multiple medical problems.  PLAN: Patient agrees to proceed with treatment as planned in the operating room as previously discussed and accepts the risks, benefits, complications of the proposed treatment.  Charlynne Pander, DDS

## 2012-01-17 ENCOUNTER — Encounter (HOSPITAL_COMMUNITY): Payer: Self-pay | Admitting: Dentistry

## 2012-01-19 ENCOUNTER — Ambulatory Visit (HOSPITAL_COMMUNITY): Payer: Self-pay | Admitting: Dentistry

## 2012-01-19 ENCOUNTER — Encounter: Payer: Self-pay | Admitting: Physician Assistant

## 2012-01-19 ENCOUNTER — Encounter (HOSPITAL_COMMUNITY): Payer: Self-pay | Admitting: Dentistry

## 2012-01-19 ENCOUNTER — Ambulatory Visit (INDEPENDENT_AMBULATORY_CARE_PROVIDER_SITE_OTHER): Payer: Medicare Other | Admitting: Physician Assistant

## 2012-01-19 VITALS — BP 115/58 | HR 67 | Ht 73.0 in | Wt 238.0 lb

## 2012-01-19 VITALS — BP 117/64 | HR 66 | Temp 97.9°F

## 2012-01-19 DIAGNOSIS — R0989 Other specified symptoms and signs involving the circulatory and respiratory systems: Secondary | ICD-10-CM

## 2012-01-19 DIAGNOSIS — K08199 Complete loss of teeth due to other specified cause, unspecified class: Secondary | ICD-10-CM

## 2012-01-19 DIAGNOSIS — C44320 Squamous cell carcinoma of skin of unspecified parts of face: Secondary | ICD-10-CM

## 2012-01-19 DIAGNOSIS — Z0189 Encounter for other specified special examinations: Secondary | ICD-10-CM

## 2012-01-19 DIAGNOSIS — Z463 Encounter for fitting and adjustment of dental prosthetic device: Secondary | ICD-10-CM

## 2012-01-19 DIAGNOSIS — E785 Hyperlipidemia, unspecified: Secondary | ICD-10-CM

## 2012-01-19 DIAGNOSIS — C44300 Unspecified malignant neoplasm of skin of unspecified part of face: Secondary | ICD-10-CM

## 2012-01-19 MED ORDER — PROPRANOLOL HCL 10 MG PO TABS: 5.0000 mg | ORAL_TABLET | Freq: Two times a day (BID) | ORAL | Status: DC

## 2012-01-19 NOTE — Assessment & Plan Note (Signed)
Followed by Dr. Regino Schultze. On Zocor.

## 2012-01-19 NOTE — Patient Instructions (Addendum)
Your physician recommends that you schedule a follow-up appointment in: 1year Your physician has requested that you have a carotid duplex. This test is an ultrasound of the carotid arteries in your neck. It looks at blood flow through these arteries that supply the brain with blood. Allow one hour for this exam. There are no restrictions or special instructions.   

## 2012-01-19 NOTE — Progress Notes (Signed)
HPI:   [name] is a 67 y/o patient of Dr. Dietrich Pates we are following for ongoing assessment and treatment of hypertension, hyperlipidemia, and permanent atrial fibrillation. He also has cirrhosis of the liver and is being followed by Dr. Benard Rink, GI. He is not on coumadin secondary to liver disease.  He was taken off the transplant list because of his basal cell carcinoma. He has basal cell carcinoma of the right face status post surgery and will start radiation therapy in the next 2 weeks. He just had multiple teeth removed prior to starting his radiation.  The patient is here today for yearly followup. He denies any chest pain, palpitations, dyspnea, dyspnea on exertion, dizziness, and presyncope.Is doing well from a cardiac standpoint. EKG from January 13, 2012 shows atrial fibrillation at 75 beats per minute with nonspecific ST-T wave changes. Recent labs showed a creatinine of 1.6 to. His cholesterol is followed by Dr. Regino Schultze.   Allergies: -- Codeine -- Other (See Comments)   --  Can't function - legs won't move,etc.  -- Other -- Rash   --  Adhesive tape  Current Outpatient Prescriptions on File Prior to Visit: ALPRAZolam (XANAX) 0.5 MG tablet, Take 0.5 mg by mouth 2 (two) times daily as needed. , Disp: , Rfl:  furosemide (LASIX) 40 MG tablet, Take 40 mg by mouth daily before breakfast., Disp: , Rfl:  gabapentin (NEURONTIN) 300 MG capsule, Take 300 mg by mouth at bedtime. , Disp: , Rfl:  insulin aspart (NOVOLOG) 100 UNIT/ML injection, Inject 0-20 Units into the skin 2 (two) times daily. Patient is on Sliding Scale, Disp: , Rfl:  insulin glargine (LANTUS) 100 UNIT/ML injection, Inject 60-70 Units into the skin at bedtime. , Disp: , Rfl:  lactulose (CHRONULAC) 10 GM/15ML solution, Take 20 g by mouth 4 (four) times daily. , Disp: , Rfl:  LANOXIN 0.125 MG tablet, TAKE ONE TABLET DAILY., Disp: 30 each, Rfl: 6 pantoprazole (PROTONIX) 40 MG tablet, TAKE (1) TABLET BY MOUTH TWICE DAILY., Disp: 60  tablet, Rfl: 5 propranolol (INDERAL) 10 MG tablet, Take 5 mg by mouth 2 (two) times daily. , Disp: , Rfl:  simvastatin (ZOCOR) 40 MG tablet, Take 40 mg by mouth at bedtime. , Disp: , Rfl:  sodium fluoride (FLUORISHIELD) 1.1 % GEL dental gel, Brush and floss. Instill 1 drop of gel per tooth space into fluoride tray. Place over teeth for 5 minutes. Remove and spit out excess. Do not rinse afterwards.Repeat nightly., Disp: 120 mL, Rfl: PRN    Past Medical History:   Hypertension                                                   Comment:negative stress nuclear study in 02/2007; normal              coronaries in 2006; Echo in 2006-aortic               sclerosis, Mild biatrial enlargement; normal LV   Pulmonary hypertension                                         Comment:Mild pulmonary hypertension with normal PVR by               2010 cath [facility])  Hyperlipidemia                                               Syncope                                         2008           Comment:orthostatic hypotension   Diabetes mellitus                                              Comment:insulin; onset in 1999; retinopathy, neuropathy              and nephropathy   Chronic atrial fibrillation                                    Comment:onset in 1995; anticoagulation discontinued in               2006   Crohn's disease                                                Comment:-lower GI bleed in 08/2002;  Pentasa stopped by               Dr. Brooke Dare at [facility] ; clinical remission.   Obesity                                                      NASH (nonalcoholic steatohepatitis)                            Comment:cirrhosis with esophageal varices; h/o GI               bleeding. EGD-2010 grade 2 esophageal               varices-banded with good hemostasis; portal               gastropathy, friable gastric mucosa; small               hiatal hernia.  EGD during recurrent melena one              week later showed  2 eschars at previously               placed bands, decompressed esophageal varices               grade 1-2,four columns, no bleeding stigmata;               portal gastropathy, normal appearing small               bowel.     Esophageal varices  Comment:s/p band ligation multiple times, Portal               Hypertensive gastropathy;   S/P TIPS (transjugular intrahepatic portosyste*                Comment:06/2009-TIPS procedure at [facility]   GI bleed                                                       Comment:07/2009-admitted with recurrent upper GI bleed               secondary to esophageal varices-banded x3   Pleural effusion, left                                         Comment:s/p thoracentesis-multiple   Depression                                                   Anemia                                                         Comment:Iron def requiring parenteral iron or               injections in past, followed by Dr. Glenford Peers; B12 def   Duodenal ulcer                                  1980s        Ascites                                                      Hiatal hernia                                   12/02/2009   Esophageal erosions                                            Comment:consistent with mild erosive reflux esophagitis   Portal hypertensive gastropathy                              Melena                                          04/21/2011  Comment:GAVE; s/p APC ablation per Dr. Jena Gauss   Cancer                                          10/2011      Squamous cell carcinoma of face                              Pneumonia                                                      Comment:long time ago   Shortness of breath                                          Chronic kidney disease                                         Comment:stage III; creatinine of 1.6 in 8/07, 1.35 and               05/2008    Nephrolithiasis                                                Comment:cystoscopy with stone extraction in 1990,    GERD (gastroesophageal reflux disease)                       Arthritis                                                      Comment:knees & ankles   Past Surgical History:   TIPS PROCEDURE                                  09/16/09      THORACIC SPINE SURGERY                          2008           Comment:Following a fall(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  normal rectum.               Somewhat  diffusely friable colonic mucosa. A               single 4 mm polyp in the ascending segment.               couple of mucosal breaks in the distal 10 cm of              terminal ileum with  a couple of erosions at               the junction  the TI  and colon.. Tubular               adenoma, chronic ileitis. Next TCS 01/2016.   ESOPHAGOGASTRODUODENOSCOPY                      01/26/2011      Comment:RMR: Decompressed/abscent esophagel varices s/p              TIPS. small hh. GAVE. Procedure:               ESOPHAGOGASTRODUODENOSCOPY (EGD);  Surgeon:               Corbin Ade, MD;  Location: [facility];                Service: Endoscopy;  Laterality: N/A;   ESOPHAGOGASTRODUODENOSCOPY                      04/22/2011      Comment:RMR: GAVE.-status post APC ablation as                described above   ESOPHAGOGASTRODUODENOSCOPY                      05/20/2011      Comment:RMR: Portal gastropathy.  GAVE - status post               APC ablation   HOT HEMOSTASIS  05/20/2011      Comment:Procedure: HOT HEMOSTASIS (ARGON PLASMA               COAGULATION/BICAP);  Surgeon: Corbin Ade,               MD;  Location: [facility];  Service:               Endoscopy;  Laterality: N/A;   ESOPHAGOGASTRODUODENOSCOPY                      07/14/2005     Comment:Persisting two columns of grade 2 esophageal               varices with one cherry red spot on one of               these columns distally.  Otherwise normal   SKIN CANCER EXCISION                            Oct 2013       Comment:large lesion rt side of face   CARDIAC CATHETERIZATION                                        Comment: 04/12/2008 [facility]): no significant CAD, mild               pulmonary hypertension with normal PVR, normal               pulmonary capillary wedge pressure, normal left              ventricular end-diastolic pressure.   MULTIPLE EXTRACTIONS WITH ALVEOLOPLASTY         01/14/2012      Comment:Procedure: MULTIPLE EXTRACION WITH               ALVEOLOPLASTY;  Surgeon: Charlynne Pander,               DDS;  Location: [facility] OR;  Service: Oral Surgery;               Laterality: N/A;  Extraction of tooth #'s               [phone] with alveoloplasty and gross               debridement of remaining teeth  Review of patient's family history indicates:   Colon cancer                   Neg Hx                   Inflammatory bowel disease     Neg Hx                   Liver disease                  Neg Hx                   Anesthesia problems            Neg Hx                   Hypotension                    Neg Hx  Malignant hyperthermia         Neg Hx                   Pseudochol deficiency          Neg Hx                   Social History   Marital Status:  Divorced            Spouse Name:                      Years of Education:                 Number of children: 1           Occupational History Occupation          Associate Professor            Comment              retired                                 disabled  Social History Main Topics   Smoking Status: Never Smoker                     Smokeless Status: Never Used                       Alcohol Use: No             Drug Use: No             Sexual Activity: Yes                    Birth Control/Protection: None  Other Topics            Concern   None on file  Social History Narrative   The patient is divorced. The patient had one son.   Patient denies ever having smoked or used smokeless tobacco products. Patient denies current use of alcohol.    Mother and father are both deceased.    VWU:JWJXBJY and bowel urgency and frequency,See history of present illness otherwise negative   PHYSICAL EXAM: Well-nournished, in no acute distress. Neck:bilateral carotid bruits left louder than right, No JVD, HJR, or thyroid enlargement  Lungs: No tachypnea, clear without wheezing, rales, or rhonchi  Cardiovascular: irregular, irregular, PMI not displaced, no murmurs, gallops, bruit, thrill, or heave.  Abdomen: BS normal. Soft without organomegaly, masses, lesions or tenderness.  Extremities: without cyanosis, clubbing or edema. Good distal pulses bilateral  SKin: Warm, no lesions or rashes   Musculoskeletal: No deformities  Neuro: no focal signs  BP 115/58  Pulse 67  Ht 6\' 1"  (1.854 m)  Wt 238 lb (107.956 kg)  BMI 31.40 kg/m2  SpO2 96%   EKG: from January 13, 2012 shows atrial fibrillation at 75 beats per minute with nonspecific ST-T wave changes

## 2012-01-19 NOTE — Assessment & Plan Note (Signed)
Patient has chronic atrial fibrillation with controlled ventricular rate. He is not on Coumadin because of chronic liver disease.

## 2012-01-19 NOTE — Patient Instructions (Addendum)
PLAN: 1. Apply Vaseline or triple antibiotic ointment to his lips 4 times daily as needed to aid healing.Do NOT use during radiation therapy. 2. Continue salt water rinses every 2 hours while awake. 3. Insert fluoride trays and scatter protection devices- today. 4. Return to clinic on February 10 for periodic oral examination during radiation therapy. Call if problems arise before then. Dr. Kristin Bruins TRISMUS  Trismus is a condition where the jaw does not allow the mouth to open as wide as it usually does.  This can happen almost suddenly, or in other cases the process is so slow, it is hard to notice it-until it is too far along.  When the jaw joints and/or muscles have been exposed to radiation treatments, the onset of Trismus is very slow.  This is because the muscles are losing their stretching ability over a long period of time, as long as 2 YEARS after the end of radiation.  It is therefore important to exercise these muscles and joints.  TRISMUS EXERCISES   Stack of tongue depressors measuring the same or a little less than the last documented MIO (Maximum Interincisal Opening).  Secure them with a rubber band on both ends.  Place the stack in the patient's mouth, supporting the other end.  Allow 30 seconds for muscle stretching.  Rest for a few seconds.  Repeat 3-5 times  For all radiation patients, this exercise is recommended in the mornings and evenings unless otherwise instructed.  The exercise should be done for a period of 2 YEARS after the end of radiation.  MIO should be checked routinely on recall dental visits by the general dentist or the hospital dentist.  The patient is advised to report any changes, soreness, or difficulties encountered when doing the exercises.   FLUORIDE TRAYS PATIENT INSTRUCTIONS    Obtain prescription from the pharmacy.  Don't be surprised if it needs to be ordered.  Be sure to let the pharmacy know when you are close to needing a new  refill for them to have it ready for you without interruption of Fluoride use.  The best time to use your Fluoride is before bed time.  You must brush your teeth very well and floss before using the Fluoride in order to get the best use out of the Fluoride treatments.  Place 1 drop of Fluoride gel per tooth in the tray.  Place the tray on your lower teeth and/or your upper teeth.  Make sure the trays are seated all the way.  Remember, they only fit one way on your teeth.  Insert for 5 full minutes.  At the end of the 5 minutes, take the trays out.  SPIT OUT excess. .  Do NOT rinse your mouth!  Do NOT eat or drink after treatments for at least 30 minutes.  This is why the best time for your treatments is before bedtime.  Clean the inside of your Fluoride trays using COLD WATER and a toothbrush.  In order to keep your Trays from discoloring and free from odors, soak them overnight in denture cleaners such as Efferdent.  Do not use bleach or non denture products.  Store the trays in a safe dry place AWAY from any heat until your next treatment.  Bring the trays with you for your next dental check-up.  The dentist will confirm their fit.  If anything happens to your Fluoride trays, or they don't fit as well after any dental work, please let us know as soon as  possible.

## 2012-01-19 NOTE — Progress Notes (Signed)
01/19/2012   POST OPERATIVE NOTE:  01/19/2012 [name] [id number]  VITALS: BP 117/64  Pulse 66  Temp 97.9 F (36.6 C) (Oral)  Patient is status post multiple extractions with alveoloplasty and gross debridement of teeth in the OR on 01/14/12. Patient presents for evaluation of healing and for insertion of the fluoride trays and scatter protection devices. Patient indicates that he is scheduled for stimulation tomorrow Dr. Thersa Salt.  SUBJECTIVE: Patient with minimal complaints from dental pain. Patient case that his mouth is sore however.  Patient also indicates that he has " chapped lips".   EXAM: No sign of infection, heme, or ooze. Sutures are intact. Generalized primary closure is noted. Good oral hygiene is noted. He does have chapped lips.  ASSESSMENT: Post operative course is consistent with dental procedures performed in the OR.  PLAN: 1. Apply Vaseline or triple antibiotic ointment to his lips 4 times daily as needed to aid healing. Do NOT use during radiation therapy. 2. Continue salt water rinses every 2 hours while awake. 3. Insert fluoride trays and scatter protection devices- today. 4. Return to clinic on February 10 for periodic oral examination during radiation therapy. Call if problems arise before then.  PROCEDURE: Appliances were tried in and adjusted as needed. [place]. Trismus device was fabricated at 55 mm and using 34 sticks. Postop instructions were provided and a written and verbal format concerning the use and care of appliances. All questions were answered. Patient to return to clinic for periodic oral examination in approximately 3 weeks during radiation therapy. Patient to call if questions or problems arise before then.   Charlynne Pander, DDS

## 2012-01-19 NOTE — Assessment & Plan Note (Signed)
Patient has bilateral carotid bruits. We'll order carotid Dopplers. 

## 2012-01-19 NOTE — Assessment & Plan Note (Signed)
stable °

## 2012-01-26 ENCOUNTER — Other Ambulatory Visit: Payer: Self-pay | Admitting: Adult Health

## 2012-01-26 NOTE — Telephone Encounter (Signed)
rx sent to pharmacy by e-script to last til pt next f/u

## 2012-02-01 ENCOUNTER — Ambulatory Visit (HOSPITAL_COMMUNITY): Payer: Medicare Other

## 2012-02-08 ENCOUNTER — Ambulatory Visit (HOSPITAL_COMMUNITY)
Admission: RE | Admit: 2012-02-08 | Discharge: 2012-02-08 | Disposition: A | Discharge status: Home / Self Care | Payer: Medicare Other | Source: Ambulatory Visit | Attending: Physician Assistant | Admitting: Physician Assistant

## 2012-02-08 DIAGNOSIS — R0989 Other specified symptoms and signs involving the circulatory and respiratory systems: Secondary | ICD-10-CM

## 2012-02-10 ENCOUNTER — Telehealth: Payer: Self-pay | Admitting: Cardiology

## 2012-02-10 NOTE — Telephone Encounter (Signed)
Called Carotid US results to patient.

## 2012-02-10 NOTE — Telephone Encounter (Signed)
Patient called and states that someone was supposed to call him back.  He was not sure what is was regarding.  Patient saw [name] recently and had carotid test done.

## 2012-02-14 ENCOUNTER — Other Ambulatory Visit (HOSPITAL_COMMUNITY): Payer: Self-pay

## 2012-02-14 ENCOUNTER — Telehealth: Payer: Self-pay | Admitting: Urgent Care

## 2012-02-14 ENCOUNTER — Encounter (HOSPITAL_COMMUNITY): Payer: Medicare Other | Attending: Oncology

## 2012-02-14 ENCOUNTER — Ambulatory Visit (HOSPITAL_COMMUNITY): Payer: Self-pay | Admitting: Dentistry

## 2012-02-14 DIAGNOSIS — D649 Anemia, unspecified: Secondary | ICD-10-CM

## 2012-02-14 DIAGNOSIS — D509 Iron deficiency anemia, unspecified: Secondary | ICD-10-CM | POA: Insufficient documentation

## 2012-02-14 DIAGNOSIS — K7460 Unspecified cirrhosis of liver: Secondary | ICD-10-CM

## 2012-02-14 DIAGNOSIS — Z79899 Other long term (current) drug therapy: Secondary | ICD-10-CM

## 2012-02-14 LAB — CBC WITH DIFFERENTIAL/PLATELET
Basophils Absolute: 0 10*3/uL (ref 0.0–0.1)
Basophils Relative: 1 % (ref 0–1)
HCT: 33.4 % — ABNORMAL LOW (ref 39.0–52.0)
MCHC: 34.1 g/dL (ref 30.0–36.0)
Monocytes Absolute: 0.5 10*3/uL (ref 0.1–1.0)
Neutro Abs: 4.6 10*3/uL (ref 1.7–7.7)
Platelets: 102 10*3/uL — ABNORMAL LOW (ref 150–400)
RDW: 15 % (ref 11.5–15.5)
WBC: 5.8 10*3/uL (ref 4.0–10.5)

## 2012-02-14 LAB — FERRITIN: Ferritin: 130 ng/mL (ref 22–322)

## 2012-02-14 NOTE — Progress Notes (Signed)
[name] Ports's reason for visit today are for labs as scheduled per MD orders.  Venipuncture performed with a 23 gauge butterfly needle to R Antecubital.  [name] Fridman tolerated venipuncture well and without incident; questions were answered and patient was discharged.

## 2012-02-14 NOTE — Telephone Encounter (Signed)
Pt aware, lab order mailed to pt.

## 2012-02-14 NOTE — Telephone Encounter (Signed)
Note reviewed from [facility]. At some point in next few weeks pt should have a magnesium level checked due to digoxin & PPI Please advise him that mag can be low when taken these meds together. I will place order. Thanks

## 2012-02-21 ENCOUNTER — Encounter (HOSPITAL_COMMUNITY): Payer: Self-pay | Admitting: Dentistry

## 2012-02-21 ENCOUNTER — Ambulatory Visit (HOSPITAL_COMMUNITY): Payer: Self-pay | Admitting: Dentistry

## 2012-02-21 VITALS — BP 147/76 | HR 82 | Temp 98.1°F | Wt 236.0 lb

## 2012-02-21 DIAGNOSIS — C760 Malignant neoplasm of head, face and neck: Secondary | ICD-10-CM

## 2012-02-21 DIAGNOSIS — R1310 Dysphagia, unspecified: Secondary | ICD-10-CM

## 2012-02-21 DIAGNOSIS — Z09 Encounter for follow-up examination after completed treatment for conditions other than malignant neoplasm: Secondary | ICD-10-CM

## 2012-02-21 DIAGNOSIS — K08199 Complete loss of teeth due to other specified cause, unspecified class: Secondary | ICD-10-CM

## 2012-02-21 DIAGNOSIS — R432 Parageusia: Secondary | ICD-10-CM

## 2012-02-21 DIAGNOSIS — K036 Deposits [accretions] on teeth: Secondary | ICD-10-CM

## 2012-02-21 NOTE — Patient Instructions (Signed)
RECOMMENDATIONS: 1. Brush after meals and at bedtime. Use fluoride at bedtime. 2. Use trismus exercises as directed. 3. Use Biotene Rinse or salt water/baking soda rinses. 4. Multiple sips of water as needed. 5. RTC in two months for periodic oral examination status post radiation therapy . Call if problems before then. Dr. Kristin Bruins

## 2012-02-21 NOTE — Progress Notes (Signed)
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 Patient is undergoing radiation therapy with Dr. Thersa Salt.  Patient now presents for a periodic oral examination during radiation therapy.   Patient has completed 11 of 30 radiation treatments.  REVIEW OF CHIEF COMPLAINTS: DRY MOUTH: Yes HARD TO SWALLOW: On the right side only HURT TO SWALLOW: On the right side only TASTE CHANGES: Taste is gone SORES IN MOUTH: No TRISMUS SYMPTOMS: Having no problems with trismus. SYMPTOM RELIEF:  Not rinsing with anything. HOME ORAL HYGIENE REGIMEN:  BRUSHING: 2X a day FLOSSING: 2 -3X a day RINSING: see above FLUORIDE: Doing fluoride at night having no problems. TRISMUS EXERCISES: Maximum interincisal opening: 50 mm.   DENTAL EXAM: ORAL HYGIENE(PLAQUE): Some plaque noted an oral hygiene improvement was suggested. LOCATION OF MUCOSITIS: None noted DESCRIPTION OF SALIVA: Decreased and ropey saliva ANY EXPOSED BONE: None noted. OTHER WATCHED AREAS: Extraction sites are healing in well by primary closure. DIAGNOSES: 1.  Xerostomia  2.  Dysgeusia 3.  Dysphagia 4.  Odynophagia 5.  Accretions  RECOMMENDATIONS: 1. Brush after meals and at bedtime. Use fluoride at bedtime. 2. Use trismus exercises as directed. 3. Use Biotene Rinse or salt water/baking soda rinses. 4. Multiple sips of water as needed. 5. RTC in two months for periodic oral examination status post radiation therapy . Call if problems before then. Dr. Kristin Bruins

## 2012-02-28 ENCOUNTER — Emergency Department (HOSPITAL_COMMUNITY): Payer: Medicare Other

## 2012-02-28 ENCOUNTER — Emergency Department (HOSPITAL_COMMUNITY)
Admission: EM | Admit: 2012-02-28 | Discharge: 2012-02-28 | Disposition: A | Discharge status: Home / Self Care | Payer: Medicare Other | Attending: Emergency Medicine | Admitting: Emergency Medicine

## 2012-02-28 ENCOUNTER — Encounter (HOSPITAL_COMMUNITY): Payer: Self-pay | Admitting: Emergency Medicine

## 2012-02-28 DIAGNOSIS — F329 Major depressive disorder, single episode, unspecified: Secondary | ICD-10-CM | POA: Insufficient documentation

## 2012-02-28 DIAGNOSIS — K5090 Crohn's disease, unspecified, without complications: Secondary | ICD-10-CM | POA: Insufficient documentation

## 2012-02-28 DIAGNOSIS — Z794 Long term (current) use of insulin: Secondary | ICD-10-CM | POA: Insufficient documentation

## 2012-02-28 DIAGNOSIS — Z8739 Personal history of other diseases of the musculoskeletal system and connective tissue: Secondary | ICD-10-CM | POA: Insufficient documentation

## 2012-02-28 DIAGNOSIS — E785 Hyperlipidemia, unspecified: Secondary | ICD-10-CM | POA: Insufficient documentation

## 2012-02-28 DIAGNOSIS — I4891 Unspecified atrial fibrillation: Secondary | ICD-10-CM | POA: Insufficient documentation

## 2012-02-28 DIAGNOSIS — I129 Hypertensive chronic kidney disease with stage 1 through stage 4 chronic kidney disease, or unspecified chronic kidney disease: Secondary | ICD-10-CM | POA: Insufficient documentation

## 2012-02-28 DIAGNOSIS — K219 Gastro-esophageal reflux disease without esophagitis: Secondary | ICD-10-CM | POA: Insufficient documentation

## 2012-02-28 DIAGNOSIS — F3289 Other specified depressive episodes: Secondary | ICD-10-CM | POA: Insufficient documentation

## 2012-02-28 DIAGNOSIS — N189 Chronic kidney disease, unspecified: Secondary | ICD-10-CM | POA: Insufficient documentation

## 2012-02-28 DIAGNOSIS — Z79899 Other long term (current) drug therapy: Secondary | ICD-10-CM | POA: Insufficient documentation

## 2012-02-28 DIAGNOSIS — Z8701 Personal history of pneumonia (recurrent): Secondary | ICD-10-CM | POA: Insufficient documentation

## 2012-02-28 DIAGNOSIS — H7091 Unspecified mastoiditis, right ear: Secondary | ICD-10-CM

## 2012-02-28 DIAGNOSIS — E669 Obesity, unspecified: Secondary | ICD-10-CM | POA: Insufficient documentation

## 2012-02-28 DIAGNOSIS — Z87442 Personal history of urinary calculi: Secondary | ICD-10-CM | POA: Insufficient documentation

## 2012-02-28 DIAGNOSIS — E1169 Type 2 diabetes mellitus with other specified complication: Secondary | ICD-10-CM | POA: Insufficient documentation

## 2012-02-28 DIAGNOSIS — Z85828 Personal history of other malignant neoplasm of skin: Secondary | ICD-10-CM | POA: Insufficient documentation

## 2012-02-28 DIAGNOSIS — H7090 Unspecified mastoiditis, unspecified ear: Secondary | ICD-10-CM | POA: Insufficient documentation

## 2012-02-28 DIAGNOSIS — Z8709 Personal history of other diseases of the respiratory system: Secondary | ICD-10-CM | POA: Insufficient documentation

## 2012-02-28 DIAGNOSIS — Z862 Personal history of diseases of the blood and blood-forming organs and certain disorders involving the immune mechanism: Secondary | ICD-10-CM | POA: Insufficient documentation

## 2012-02-28 DIAGNOSIS — Z8719 Personal history of other diseases of the digestive system: Secondary | ICD-10-CM | POA: Insufficient documentation

## 2012-02-28 LAB — PROTIME-INR: INR: 1.28 (ref 0.00–1.49)

## 2012-02-28 LAB — CBC WITH DIFFERENTIAL/PLATELET
Hemoglobin: 11.4 g/dL — ABNORMAL LOW (ref 13.0–17.0)
Lymphocytes Relative: 6 % — ABNORMAL LOW (ref 12–46)
Lymphs Abs: 0.4 10*3/uL — ABNORMAL LOW (ref 0.7–4.0)
MCV: 93.1 fL (ref 78.0–100.0)
Neutrophils Relative %: 86 % — ABNORMAL HIGH (ref 43–77)
Platelets: 104 10*3/uL — ABNORMAL LOW (ref 150–400)
RBC: 3.62 MIL/uL — ABNORMAL LOW (ref 4.22–5.81)
WBC: 6.8 10*3/uL (ref 4.0–10.5)

## 2012-02-28 LAB — COMPREHENSIVE METABOLIC PANEL
ALT: 10 U/L (ref 0–53)
Alkaline Phosphatase: 112 U/L (ref 39–117)
CO2: 25 mEq/L (ref 19–32)
GFR calc Af Amer: 48 mL/min — ABNORMAL LOW (ref >90)
GFR calc non Af Amer: 41 mL/min — ABNORMAL LOW (ref >90)
Glucose, Bld: 350 mg/dL — ABNORMAL HIGH (ref 70–99)
Potassium: 5 mEq/L (ref 3.5–5.1)
Sodium: 133 mEq/L — ABNORMAL LOW (ref 135–145)
Total Bilirubin: 1.3 mg/dL — ABNORMAL HIGH (ref 0.3–1.2)

## 2012-02-28 LAB — URINALYSIS, ROUTINE W REFLEX MICROSCOPIC
Bilirubin Urine: NEGATIVE
Ketones, ur: NEGATIVE mg/dL
Nitrite: NEGATIVE
Urobilinogen, UA: 0.2 mg/dL (ref 0.0–1.0)

## 2012-02-28 LAB — AMMONIA: Ammonia: 25 umol/L (ref 11–60)

## 2012-02-28 LAB — RAPID URINE DRUG SCREEN, HOSP PERFORMED
Barbiturates: NOT DETECTED
Opiates: NOT DETECTED
Tetrahydrocannabinol: NOT DETECTED

## 2012-02-28 LAB — URINE MICROSCOPIC-ADD ON

## 2012-02-28 MED ORDER — CLINDAMYCIN HCL 150 MG PO CAPS: ORAL_CAPSULE | ORAL | Status: DC

## 2012-02-28 MED ORDER — CLINDAMYCIN PHOSPHATE 300 MG/50ML IV SOLN
300.0000 mg | Freq: Once | INTRAVENOUS | Status: AC
  Administered 2012-02-28: 300 mg via INTRAVENOUS

## 2012-02-28 MED ORDER — CLINDAMYCIN PHOSPHATE 300 MG/50ML IV SOLN
INTRAVENOUS | Status: AC
  Filled 2012-02-28: qty 50

## 2012-02-28 NOTE — ED Notes (Signed)
Per ems patient having low cbg this am.

## 2012-02-28 NOTE — ED Provider Notes (Signed)
History     CSN: [id number]  Arrival date & time 02/28/12  1301   First MD Initiated Contact with Patient 02/28/12 1328      Chief Complaint  Patient presents with  . Hypoglycemia     HPI Pt was seen at 1340.   Per pt and his wife, c/o gradual onset and resolution of one episode of "confusion" that began approx 0130 this morning PTA.  Pt states another family member was woken up by the pt at approx 0130 by another family member "crying out" and "confused."  Pt's wife states she was told about this incident this morning.  States she gave him "4 doses of his lactulose because I know he's been missing doses" then checked his CBG.  Pt's CBG was "42."  States she gave him a cheese sandwich and 4 glasses of orange juice.  EMS gave IV D50.  Pt has now returned to his baseline.  Pt states he "didn't eat because the radiation treatments make everything taste funny" (pt on XRT for right face skin CA, LD Friday) last night or this morning, with his last meal yesterday afternoon.  States he did not take his insulin this morning but did take it last night.  States he "feels fine now."  Denies CP/SOB, no abd pain, no N/V/D, no fevers, no back pain, no focal motor weakness, no tingling/numbness in extremities, no unresponsiveness.   Past Medical History  Diagnosis Date  . Hypertension     negative stress nuclear study in 02/2007; normal coronaries in 2006; Echo in 2006-aortic sclerosis, Mild biatrial enlargement; normal LV  . Pulmonary hypertension     Mild pulmonary hypertension with normal PVR by 2010 cath [facility])  . Hyperlipidemia   . Syncope 2008    orthostatic hypotension  . Diabetes mellitus     insulin; onset in 1999; retinopathy, neuropathy and nephropathy  . Chronic atrial fibrillation     onset in 1995; anticoagulation discontinued in 2006  . Crohn's disease     -lower GI bleed in 08/2002;  Pentasa stopped by Dr. Brooke Dare at [facility] ; clinical remission.  . Obesity   . NASH (nonalcoholic  steatohepatitis)     cirrhosis with esophageal varices; h/o GI bleeding. EGD-2010 grade 2 esophageal varices-banded with good hemostasis; portal gastropathy, friable gastric mucosa; small hiatal hernia.  EGD during recurrent melena one week later showed 2 eschars at previously placed bands, decompressed esophageal varices grade 1-2,four columns, no bleeding stigmata; portal gastropathy, normal appearing small bowel.    . Esophageal varices     s/p band ligation multiple times, Portal Hypertensive gastropathy;  . S/P TIPS (transjugular intrahepatic portosystemic shunt)     06/2009-TIPS procedure at [facility]  . GI bleed     07/2009-admitted with recurrent upper GI bleed secondary to esophageal varices-banded x3  . Pleural effusion, left     s/p thoracentesis-multiple  . Depression   . Anemia     Iron def requiring parenteral iron or injections in past, followed by Dr. Glenford Peers; B12 def  . Duodenal ulcer 1980s  . Ascites   . Hiatal hernia 12/02/2009  . Esophageal erosions     consistent with mild erosive reflux esophagitis  . Portal hypertensive gastropathy   . 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 Jena Gauss  . Cancer 10/2011  . Squamous cell carcinoma of face   . Pneumonia     long time ago  . Shortness of breath   . Chronic  kidney disease     stage III; creatinine of 1.6 in 8/07, 1.35 and 05/2008  . Nephrolithiasis     cystoscopy with stone extraction in 1990,   . GERD (gastroesophageal reflux disease)   . Arthritis     knees & ankles     Past Surgical History  Procedure Laterality Date  . Tips procedure  09/16/09  . 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 Cholecystectomy    . Circumcision  -1992  . Colonoscopy  -2008    Reportedly normal, done in [place]  . Multiple thoracenteses    . Esophagogastroduodenoscopy  12/02/2009    distal esophageal erosions, small hiatal hernia  . Tips procedure  2011   . Colonoscopy  01/26/2011    RMR: adequate preparation.  normal rectum. Somewhat  diffusely friable colonic mucosa. A single 4 mm polyp in the ascending segment. couple of mucosal breaks in the distal 10 cm of terminal ileum with  a couple of erosions at the junction  the TI  and colon.. Tubular adenoma, chronic ileitis. Next TCS 01/2016.  Marland Kitchen Esophagogastroduodenoscopy  01/26/2011    RMR: Decompressed/abscent esophagel varices s/p TIPS. small hh. GAVE. Procedure: ESOPHAGOGASTRODUODENOSCOPY (EGD);  Surgeon: Corbin Ade, MD;  Location: [facility];  Service: Endoscopy;  Laterality: N/A;  . Esophagogastroduodenoscopy  04/22/2011    RMR: GAVE.-status post APC ablation as described above  . Esophagogastroduodenoscopy  05/20/2011    RMR: Portal gastropathy.  GAVE - status post APC ablation  . Hot hemostasis  05/20/2011    Procedure: HOT HEMOSTASIS (ARGON PLASMA COAGULATION/BICAP);  Surgeon: Corbin Ade, MD;  Location: [facility];  Service: Endoscopy;  Laterality: N/A;  . Esophagogastroduodenoscopy  07/14/2005    Persisting two columns of grade 2 esophageal varices with one cherry red spot on one of these columns distally.  Otherwise normal  . Skin cancer excision  Oct 2013    large lesion rt side of face  . Cardiac catheterization       04/12/2008 [facility]): no significant CAD, mild pulmonary hypertension with normal PVR, normal pulmonary capillary wedge pressure, normal left ventricular end-diastolic pressure.  . Multiple extractions with alveoloplasty  01/14/2012    Procedure: MULTIPLE EXTRACION WITH ALVEOLOPLASTY;  Surgeon: Charlynne Pander, DDS;  Location: [facility] OR;  Service: Oral Surgery;  Laterality: N/A;  Extraction of tooth #'s [phone] with alveoloplasty and gross debridement of remaining teeth    Family History  Problem Relation Age of Onset  . Colon cancer Neg Hx   . Inflammatory bowel disease Neg Hx   . Liver disease Neg Hx   . Anesthesia problems Neg Hx   . Hypotension Neg Hx   .  Malignant hyperthermia Neg Hx   . Pseudochol deficiency Neg Hx     History  Substance Use Topics  . Smoking status: Never Smoker   . Smokeless tobacco: Never Used  . Alcohol Use: No      Review of Systems ROS: Statement: All systems negative except as marked or noted in the HPI; Constitutional: Negative for fever and chills. ; ; Eyes: Negative for eye pain, redness and discharge. ; ; ENMT: Negative for ear pain, hoarseness, nasal congestion, sinus pressure and sore throat. ; ; Cardiovascular: Negative for chest pain, palpitations, diaphoresis, dyspnea and peripheral edema. ; ; Respiratory: Negative for cough, wheezing and stridor. ; ; Gastrointestinal: Negative for nausea, vomiting, diarrhea, abdominal pain, blood in stool,  hematemesis, jaundice and rectal bleeding. . ; ; Genitourinary: Negative for dysuria, flank pain and hematuria. ; ; Musculoskeletal: Negative for back pain and neck pain. Negative for swelling and trauma.; ; Skin: Negative for pruritus, rash, abrasions, blisters, bruising and skin lesion.; ; Neuro: +AMS. Negative for headache, lightheadedness and neck stiffness. Negative for weakness, altered level of consciousness, extremity weakness, paresthesias, involuntary movement, seizure and syncope.       Allergies  Codeine and Other  Home Medications   Current Outpatient Rx  Name  Route  Sig  Dispense  Refill  . ALPRAZolam (XANAX) 0.5 MG tablet   Oral   Take 0.5 mg by mouth 2 (two) times daily as needed.          . furosemide (LASIX) 40 MG tablet      TAKE 1 TABLET BY MOUTH ONCE DAILY.   30 tablet   11   . gabapentin (NEURONTIN) 300 MG capsule   Oral   Take 300 mg by mouth at bedtime.          . insulin aspart (NOVOLOG) 100 UNIT/ML injection   Subcutaneous   Inject 0-20 Units into the skin 2 (two) times daily. Patient is on Sliding Scale         . insulin glargine (LANTUS) 100 UNIT/ML injection   Subcutaneous   Inject 60-70 Units into the skin at  bedtime.          Marland Kitchen lactulose (CHRONULAC) 10 GM/15ML solution   Oral   Take 20 g by mouth 4 (four) times daily.          Marland Kitchen LANOXIN 0.125 MG tablet      TAKE ONE TABLET DAILY.   30 each   6   . pantoprazole (PROTONIX) 40 MG tablet      TAKE (1) TABLET BY MOUTH TWICE DAILY.   60 tablet   5   . propranolol (INDERAL) 10 MG tablet   Oral   Take 0.5 tablets (5 mg total) by mouth 2 (two) times daily.   45 tablet   3   . simvastatin (ZOCOR) 40 MG tablet   Oral   Take 40 mg by mouth at bedtime.            BP 160/67  Pulse 73  Temp(Src) 97.5 F (36.4 C) (Oral)  Resp 18  Ht 6\' 1"  (1.854 m)  Wt 238 lb (107.956 kg)  BMI 31.41 kg/m2  SpO2 100%  Physical Exam 1345: Physical examination:  Nursing notes reviewed; Vital signs and O2 SAT reviewed;  Constitutional: Well developed, Well nourished, Well hydrated, In no acute distress; Head:  Normocephalic, atraumatic, +right side of head/face with mild erythema; Eyes: EOMI, PERRL, No scleral icterus; ENMT: Mouth and pharynx normal, Mucous membranes moist; Neck: Supple, Full range of motion, No lymphadenopathy; Cardiovascular: Regular rate and rhythm, No murmur, rub, or gallop; Respiratory: Breath sounds clear & equal bilaterally, No rales, rhonchi, wheezes.  Speaking full sentences with ease, Normal respiratory effort/excursion; Chest: Nontender, Movement normal; Abdomen: Soft, Nontender, Nondistended, Normal bowel sounds;; Extremities: Pulses normal, No tenderness, No edema, No calf edema or asymmetry.; Neuro: AA&Ox3, Major CN grossly intact.  No facial droop. Speech clear.  Strength 5/5 equal bilat UE's and LE's. Equal grips. No gross focal motor or sensory deficits in extremities.; Skin: Color normal, Warm, Dry.   ED Course  Procedures    MDM  MDM Reviewed: previous chart, nursing note and vitals Reviewed previous: labs and ECG Interpretation: labs, ECG, x-ray  and CT scan      Date: 02/28/2012  Rate: 75  Rhythm: atrial  fibrillation  QRS Axis: normal  Intervals: normal  ST/T Wave abnormalities: nonspecific ST/T changes  Conduction Disutrbances:none  Narrative Interpretation:   Old EKG Reviewed: unchanged; no significant changes from previous EKG dated 1/9/12014.  Results for orders placed during the hospital encounter of 02/28/12  GLUCOSE, CAPILLARY      Result Value Range   Glucose-Capillary 310 (*) 70 - 99 mg/dL  URINE RAPID DRUG SCREEN (HOSP PERFORMED)      Result Value Range   Opiates NONE DETECTED  NONE DETECTED   Cocaine NONE DETECTED  NONE DETECTED   Benzodiazepines NONE DETECTED  NONE DETECTED   Amphetamines NONE DETECTED  NONE DETECTED   Tetrahydrocannabinol NONE DETECTED  NONE DETECTED   Barbiturates NONE DETECTED  NONE DETECTED  ETHANOL      Result Value Range   Alcohol, Ethyl (B) <11  0 - 11 mg/dL  CBC WITH DIFFERENTIAL      Result Value Range   WBC 6.8  4.0 - 10.5 K/uL   RBC 3.62 (*) 4.22 - 5.81 MIL/uL   Hemoglobin 11.4 (*) 13.0 - 17.0 g/dL   HCT 16.1 (*) 09.6 - 04.5 %   MCV 93.1  78.0 - 100.0 fL   MCH 31.5  26.0 - 34.0 pg   MCHC 33.8  30.0 - 36.0 g/dL   RDW 40.9  81.1 - 91.4 %   Platelets 104 (*) 150 - 400 K/uL   Neutrophils Relative 86 (*) 43 - 77 %   Neutro Abs 5.9  1.7 - 7.7 K/uL   Lymphocytes Relative 6 (*) 12 - 46 %   Lymphs Abs 0.4 (*) 0.7 - 4.0 K/uL   Monocytes Relative 7  3 - 12 %   Monocytes Absolute 0.5  0.1 - 1.0 K/uL   Eosinophils Relative 1  0 - 5 %   Eosinophils Absolute 0.0  0.0 - 0.7 K/uL   Basophils Relative 0  0 - 1 %   Basophils Absolute 0.0  0.0 - 0.1 K/uL  COMPREHENSIVE METABOLIC PANEL      Result Value Range   Sodium 133 (*) 135 - 145 mEq/L   Potassium 5.0  3.5 - 5.1 mEq/L   Chloride 99  96 - 112 mEq/L   CO2 25  19 - 32 mEq/L   Glucose, Bld 350 (*) 70 - 99 mg/dL   BUN 24 (*) 6 - 23 mg/dL   Creatinine, Ser 7.82 (*) 0.50 - 1.35 mg/dL   Calcium 9.1  8.4 - 95.6 mg/dL   Total Protein 8.3  6.0 - 8.3 g/dL   Albumin 3.0 (*) 3.5 - 5.2 g/dL   AST 22   0 - 37 U/L   ALT 10  0 - 53 U/L   Alkaline Phosphatase 112  39 - 117 U/L   Total Bilirubin 1.3 (*) 0.3 - 1.2 mg/dL   GFR calc non Af Amer 41 (*) >90 mL/min   GFR calc Af Amer 48 (*) >90 mL/min  URINALYSIS, ROUTINE W REFLEX MICROSCOPIC      Result Value Range   Color, Urine YELLOW  YELLOW   APPearance CLEAR  CLEAR   Specific Gravity, Urine 1.025  1.005 - 1.030   pH 5.5  5.0 - 8.0   Glucose, UA >1000 (*) NEGATIVE mg/dL   Hgb urine dipstick MODERATE (*) NEGATIVE   Bilirubin Urine NEGATIVE  NEGATIVE   Ketones, ur NEGATIVE  NEGATIVE  mg/dL   Protein, ur 30 (*) NEGATIVE mg/dL   Urobilinogen, UA 0.2  0.0 - 1.0 mg/dL   Nitrite NEGATIVE  NEGATIVE   Leukocytes, UA NEGATIVE  NEGATIVE  AMMONIA      Result Value Range   Ammonia 25  11 - 60 umol/L  PROTIME-INR      Result Value Range   Prothrombin Time 15.7 (*) 11.6 - 15.2 seconds   INR 1.28  0.00 - 1.49  URINE MICROSCOPIC-ADD ON      Result Value Range   Squamous Epithelial / LPF RARE  RARE   WBC, UA 0-2  <3 WBC/hpf   RBC / HPF 3-6  <3 RBC/hpf   Bacteria, UA RARE  RARE   Dg Chest 1 View 02/28/2012  *RADIOLOGY REPORT*  Clinical Data: Altered mental status.  CHEST - 1 VIEW  Comparison: 01/31/2009  Findings: Left lower lobe opacity persists with small chronic left pleural effusion versus pleural thickening.  Findings are similar to prior study and likely reflects scarring.  Heart is mildly enlarged.  Postoperative changes in the thoracolumbar spine.  Right lung remains clear.  Old right rib fractures again noted.  IMPRESSION: Stable chronic left basilar opacity, presumably scarring.  Stable small chronic left pleural effusion versus pleural thickening.   Original Report Authenticated By: Charlett Nose, M.D.    Ct Head Wo Contrast 02/28/2012  *RADIOLOGY REPORT*  Clinical Data: Altered mental status.  CT HEAD WITHOUT CONTRAST  Technique:  Contiguous axial images were obtained from the base of the skull through the vertex without contrast.  Comparison:  07/26/2006  Findings: Mild age related volume loss. No acute intracranial abnormality.  Specifically, no hemorrhage, hydrocephalus, mass lesion, acute infarction, or significant intracranial injury.  No acute calvarial abnormality.  Paranasal sinuses and left mastoid air cells are clear.  Opacified right mastoid air cells, new since prior study.  IMPRESSION: No acute intracranial abnormality.  Fluid in the right mastoid air cells, question mastoiditis.   Original Report Authenticated By: Charlett Nose, M.D.     Results for EDSEL, SHIVES (MRN [id number]) as of 02/28/2012 17:14  Ref. Range 04/23/2011 04:41 04/24/2011 05:30 01/10/2012 11:11 02/28/2012 13:03  BUN Latest Range: 6-23 mg/dL 17 14 20 24  (H)  Creatinine Latest Range: 0.50-1.35 mg/dL 6.57 (H) 8.46 (H) 9.62 (H) 1.66 (H)   ; Results for DELYLE, WEIDER (MRN [id number]) as of 02/28/2012 17:14  Ref. Range 10/06/2011 12:12 11/16/2011 11:22 01/10/2012 11:11 02/14/2012 11:22 02/28/2012 13:03  Hemoglobin Latest Range: 13.0-17.0 g/dL 40.1 (L) 02.7 (L) 25.3 (L) 11.4 (L) 11.4 (L)  HCT Latest Range: 39.0-52.0 % 31.0 (L) 32.4 (L) 37.0 (L) 33.4 (L) 33.7 (L)  Platelets Latest Range: 150-400 K/uL 95 (L) 121 (L) 115 (L) 102 (L) 104 (L)    1700:   Pt re-examined after CT completed.  Pt states he has been receiving XRT to his right ear/head/face area for his skin cancer with his LD 2 days ago.  States his right ear/side of head "always hurts and is red after the radiation."  Denies any new complaints, no ear drainage, no open wounds.  Right mastoid, ear, neck and side of face are erythematous, no open wounds, no specific right mastoid tenderness (entire right ear/head/face area is "sore like usual" per pt when examined).  Right TM obscured by wax.  T/C to ENT Dr. Annalee Genta, case discussed, including:  HPI, pertinent PM/SHx, VS/PE, dx testing, ED course and treatment:  CT findings likely incidental given recent XRT treatment, pt is  afebrile with normal WBC count, recommends to  continue abx for the next week and f/u in office.  Pt already has taken augmentin (rx by his Derm MD for "my skin cancer"), and states he does not want to continue it "because the diarrhea was just too bad with me taking the lactulose too."  Will dose IV clindamycin here and then give rx.  Pt has been back to baseline while in ED, tol PO well without N/V, VS remain stable.  Wants to go home now and family wants to take him home.  Strongly cautioned regarding taking his insulin and not eating.  Verb understanding. Dx and testing d/w pt and family.  Questions answered.  Verb understanding, agreeable to d/c home with outpt f/u.       Laray Anger, DO 03/03/12 1047

## 2012-02-29 LAB — URINE CULTURE

## 2012-03-02 NOTE — Progress Notes (Signed)
REVIEWED.  

## 2012-03-22 ENCOUNTER — Encounter: Payer: Self-pay | Admitting: Internal Medicine

## 2012-03-27 ENCOUNTER — Other Ambulatory Visit: Payer: Self-pay | Admitting: Cardiology

## 2012-04-11 ENCOUNTER — Ambulatory Visit (INDEPENDENT_AMBULATORY_CARE_PROVIDER_SITE_OTHER): Payer: Medicare Other | Admitting: Internal Medicine

## 2012-04-11 ENCOUNTER — Encounter: Payer: Self-pay | Admitting: Internal Medicine

## 2012-04-11 ENCOUNTER — Ambulatory Visit: Payer: Self-pay | Admitting: Internal Medicine

## 2012-04-11 ENCOUNTER — Encounter (HOSPITAL_COMMUNITY): Payer: Self-pay | Admitting: Oncology

## 2012-04-11 ENCOUNTER — Encounter (HOSPITAL_COMMUNITY): Payer: Medicare Other | Attending: Oncology | Admitting: Oncology

## 2012-04-11 VITALS — BP 142/67 | HR 65 | Temp 97.9°F | Ht 73.0 in | Wt 230.8 lb

## 2012-04-11 VITALS — BP 143/73 | HR 72 | Temp 98.1°F | Resp 20 | Wt 230.8 lb

## 2012-04-11 DIAGNOSIS — D638 Anemia in other chronic diseases classified elsewhere: Secondary | ICD-10-CM

## 2012-04-11 DIAGNOSIS — K31811 Angiodysplasia of stomach and duodenum with bleeding: Secondary | ICD-10-CM

## 2012-04-11 DIAGNOSIS — K7689 Other specified diseases of liver: Secondary | ICD-10-CM

## 2012-04-11 DIAGNOSIS — D500 Iron deficiency anemia secondary to blood loss (chronic): Secondary | ICD-10-CM

## 2012-04-11 DIAGNOSIS — K219 Gastro-esophageal reflux disease without esophagitis: Secondary | ICD-10-CM

## 2012-04-11 DIAGNOSIS — K7290 Hepatic failure, unspecified without coma: Secondary | ICD-10-CM

## 2012-04-11 DIAGNOSIS — K7581 Nonalcoholic steatohepatitis (NASH): Secondary | ICD-10-CM

## 2012-04-11 DIAGNOSIS — D649 Anemia, unspecified: Secondary | ICD-10-CM | POA: Insufficient documentation

## 2012-04-11 LAB — CBC
HCT: 34.2 % — ABNORMAL LOW (ref 39.0–52.0)
MCH: 31.6 pg (ref 26.0–34.0)
MCV: 94 fL (ref 78.0–100.0)
Platelets: 99 10*3/uL — ABNORMAL LOW (ref 150–400)
RBC: 3.64 MIL/uL — ABNORMAL LOW (ref 4.22–5.81)
WBC: 5.1 10*3/uL (ref 4.0–10.5)

## 2012-04-11 NOTE — Progress Notes (Signed)
Primary Care Physician:  Kirk Ruths, MD Primary Gastroenterologist:  Dr. Jena Gauss  Pre-Procedure History & Physical: HPI:  [name] is a 67 y.o. male here for followup of [place]/cirrhosis status post TIPS for refractory ascites, Crohn's disease felt to be in remission last seen here 12/05/2001. His donor very well from a standpoint of encephalopathy he takes lactulose 30 cc twice daily he takes Xifaxan when he can afford it. Generally he is mentally clear having 3-4 semi-formed/loose bowel movements daily. Follow Dr. Mariel Sleet for iron deficiency. We wonder if Inderal can be stopped since he now has a functioning TIPS. 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 He's had radiation /chemotherapy. He tells me is no longer on the transplant list;  hemoglobin in the 11 range. Primary care physician  recently decreased  Lasix to 20 mg once daily. I currently cannot find in his voluminous paper medical record regarding his hepatitis A and B. immune status. Occasional breakthrough reflux symptoms on Protonix 40 mg twice daily. No dysphagia, nausea or vomiting. MRI last year [facility] demonstrated heterogeneous appearing liver consistent with patchy iron deposition.   Past Medical History  Diagnosis Date  . Hypertension     negative stress nuclear study in 02/2007; normal coronaries in 2006; Echo in 2006-aortic sclerosis, Mild biatrial enlargement; normal LV  . Pulmonary hypertension     Mild pulmonary hypertension with normal PVR by 2010 cath [facility])  . Hyperlipidemia   . Syncope 2008    orthostatic hypotension  . Diabetes mellitus     insulin; onset in 1999; retinopathy, neuropathy and nephropathy  . Chronic atrial fibrillation     onset in 1995; anticoagulation discontinued in 2006  . Crohn's disease     -lower GI bleed in 08/2002;  Pentasa stopped by Dr. Brooke Dare at [facility] ; clinical remission.  . Obesity   . NASH (nonalcoholic steatohepatitis)     cirrhosis with  esophageal varices; h/o GI bleeding. EGD-2010 grade 2 esophageal varices-banded with good hemostasis; portal gastropathy, friable gastric mucosa; small hiatal hernia.  EGD during recurrent melena one week later showed 2 eschars at previously placed bands, decompressed esophageal varices grade 1-2,four columns, no bleeding stigmata; portal gastropathy, normal appearing small bowel.    . Esophageal varices     s/p band ligation multiple times, Portal Hypertensive gastropathy;  . S/P TIPS (transjugular intrahepatic portosystemic shunt)     06/2009-TIPS procedure at [facility]  . GI bleed     07/2009-admitted with recurrent upper GI bleed secondary to esophageal varices-banded x3  . Pleural effusion, left     s/p thoracentesis-multiple  . Depression   . Anemia     Iron def requiring parenteral iron or injections in past, followed by Dr. Glenford Peers; B12 def  . Duodenal ulcer 1980s  . Ascites   . Hiatal hernia 12/02/2009  . Esophageal erosions     consistent with mild erosive reflux esophagitis  . Portal hypertensive gastropathy   . 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 Jena Gauss  . Cancer 10/2011  . Squamous cell carcinoma of face   . Pneumonia     long time ago  . Shortness of breath   . Chronic kidney disease     stage III; creatinine of 1.6 in 8/07, 1.35 and 05/2008  . Nephrolithiasis     cystoscopy with stone extraction in 1990,   . GERD (gastroesophageal reflux disease)   . Arthritis     knees &  ankles     Past Surgical History  Procedure Laterality Date  . Tips procedure  09/16/09  . 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 Cholecystectomy    . Circumcision  -1992  . Colonoscopy  -2008    Reportedly normal, done in [place]  . Multiple thoracenteses    . Esophagogastroduodenoscopy  12/02/2009    distal esophageal erosions, small hiatal hernia  . Tips procedure  2011  . Colonoscopy  01/26/2011    RMR:  adequate preparation.  normal rectum. Somewhat  diffusely friable colonic mucosa. A single 4 mm polyp in the ascending segment. couple of mucosal breaks in the distal 10 cm of terminal ileum with  a couple of erosions at the junction  the TI  and colon.. Tubular adenoma, chronic ileitis. Next TCS 01/2016.  Marland Kitchen Esophagogastroduodenoscopy  01/26/2011    RMR: Decompressed/abscent esophagel varices s/p TIPS. small hh. GAVE. Procedure: ESOPHAGOGASTRODUODENOSCOPY (EGD);  Surgeon: Corbin Ade, MD;  Location: [facility];  Service: Endoscopy;  Laterality: N/A;  . Esophagogastroduodenoscopy  04/22/2011    RMR: GAVE.-status post APC ablation as described above  . Esophagogastroduodenoscopy  05/20/2011    RMR: Portal gastropathy.  GAVE - status post APC ablation  . Hot hemostasis  05/20/2011    Procedure: HOT HEMOSTASIS (ARGON PLASMA COAGULATION/BICAP);  Surgeon: Corbin Ade, MD;  Location: [facility];  Service: Endoscopy;  Laterality: N/A;  . Esophagogastroduodenoscopy  07/14/2005    Persisting two columns of grade 2 esophageal varices with one cherry red spot on one of these columns distally.  Otherwise normal  . Skin cancer excision  Oct 2013    large lesion rt side of face  . Cardiac catheterization       04/12/2008 [facility]): no significant CAD, mild pulmonary hypertension with normal PVR, normal pulmonary capillary wedge pressure, normal left ventricular end-diastolic pressure.  . Multiple extractions with alveoloplasty  01/14/2012    Procedure: MULTIPLE EXTRACION WITH ALVEOLOPLASTY;  Surgeon: Charlynne Pander, DDS;  Location: [facility] OR;  Service: Oral Surgery;  Laterality: N/A;  Extraction of tooth #'s [phone] with alveoloplasty and gross debridement of remaining teeth    Prior to Admission medications   Medication Sig Start Date End Date Taking? Authorizing Provider  ALPRAZolam Prudy Feeler) 0.5 MG tablet Take 0.5 mg by mouth 2 (two) times daily as needed.  02/08/11  Yes Historical Provider, MD   betamethasone dipropionate (DIPROLENE) 0.05 % cream  03/07/12  Yes Historical Provider, MD  DIGOX 0.125 MG tablet TAKE ONE TABLET BY MOUTH DAILY. 03/27/12  Yes Jonelle Sidle, MD  furosemide (LASIX) 40 MG tablet  01/26/12  Yes Jodelle Gross, NP  gabapentin (NEURONTIN) 300 MG capsule Take 300 mg by mouth at bedtime.  05/29/10  Yes Historical Provider, MD  insulin aspart (NOVOLOG) 100 UNIT/ML injection Inject 0-20 Units into the skin 2 (two) times daily. Patient is on Sliding Scale   Yes Historical Provider, MD  insulin glargine (LANTUS) 100 UNIT/ML injection Inject 60-70 Units into the skin at bedtime.    Yes Historical Provider, MD  lactulose (CHRONULAC) 10 GM/15ML solution Take 20 g by mouth 4 (four) times daily.    Yes Historical Provider, MD  lisinopril (PRINIVIL,ZESTRIL) 5 MG tablet Take 5 mg by mouth daily.  04/07/12  Yes Historical Provider, MD  pantoprazole (PROTONIX) 40 MG tablet TAKE (1) TABLET BY MOUTH TWICE DAILY. 01/10/12  Yes Joselyn Arrow, NP  propranolol (INDERAL) 10 MG tablet Take 0.5 tablets (5 mg total) by mouth 2 (two) times daily. 01/19/12  Yes Dyann Kief, PA-C  simvastatin (ZOCOR) 40 MG tablet Take 40 mg by mouth at bedtime.    Yes Historical Provider, MD    Allergies as of 04/11/2012 - Review Complete 04/11/2012  Allergen Reaction Noted  . Codeine Other (See Comments)   . Other Rash 05/05/2010    Family History  Problem Relation Age of Onset  . Colon cancer Neg Hx   . Inflammatory bowel disease Neg Hx   . Liver disease Neg Hx   . Anesthesia problems Neg Hx   . Hypotension Neg Hx   . Malignant hyperthermia Neg Hx   . Pseudochol deficiency Neg Hx     History   Social History  . Marital Status: Divorced    Spouse Name: N/A    Number of Children: 1  . Years of Education: N/A   Occupational History  . retired     disabled   Social History Main Topics  . Smoking status: Never Smoker   . Smokeless tobacco: Never Used  . Alcohol Use: No  . Drug Use:  No  . Sexually Active: Yes    Birth Control/ Protection: None   Other Topics Concern  . Not on file   Social History Narrative   The patient is divorced. The patient had one son.   Patient denies ever having smoked or used smokeless tobacco products. Patient denies current use of alcohol.      Mother and father are both deceased.    Review of Systems: See HPI, otherwise negative ROS  Physical Exam: BP 142/67  Pulse 65  Temp(Src) 97.9 F (36.6 C) (Oral)  Ht 6\' 1"  (1.854 m)  Wt 230 lb 12.8 oz (104.69 kg)  BMI 30.46 kg/m2 General:   Somewhat disheveled chronically ill gentleman resting comfortably; accompanied by his sister-in-law. Status post surgery right ear Skin:  Intact without significant lesions or rashes. Eyes:  Sclera clear, no icterus.   Conjunctiva pink. Ears:  Normal auditory acuity. Nose:  No deformity, discharge,  or lesions. Mouth:  No deformity or lesions. Neck:  Supple; no masses or thyromegaly. No significant cervical adenopathy. Lungs:  Clear throughout to auscultation.   No wheezes, crackles, or rhonchi. No acute distress. Heart:  Regular rate and rhythm; no murmurs, clicks, rubs,  or gallops. Abdomen: Full, somewhat obese. normal bowel sounds.  Soft and nontender ball size umbilical hernia. Easily reducible. No obvious mass, shifting dullness or fluid wave. .  Pulses:  Normal pulses noted. Extremities:  Without clubbing or edema.  Impression/Plan: Very pleasant 67 year old with a complicated GI history. Status post TIPS for recurrent ascites variceal bleeding. Encephalopathy very well managed with fluctuating doses of lactulose and Xifaxan we has it on hand. The Xifaxan drug representative here today so we can get him some samples.  Hepatitis A and B. immune status unknown at this time. Further chart research needed.     Recommendations: For now continue current medical regimen. Keep followup appointment at [facility]. We will plan to see him back in 3  months.  Reviewed the multipronged approach to gastroesophageal reflux disease.  Patient to ask Dr. Raford Pitcher whether or not he can stop Inderal at this time.  Further recommendations to follow regarding hepatitis A and B. immune status in the near future.

## 2012-04-11 NOTE — Addendum Note (Signed)
Addended by: Evelena Leyden on: 04/11/2012 01:38 PM   Modules accepted: Orders

## 2012-04-11 NOTE — Progress Notes (Signed)
[name] presented for [employer]. Labs per MD order drawn via Peripheral Line 23 gauge needle inserted in right AC  Good blood return present. Procedure without incident.  Needle removed intact. Patient tolerated procedure well.

## 2012-04-11 NOTE — Patient Instructions (Addendum)
[facility] Discharge Instructions  RECOMMENDATIONS MADE BY THE CONSULTANT AND ANY TEST RESULTS WILL BE SENT TO YOUR REFERRING PHYSICIAN.  EXAM FINDINGS BY THE PHYSICIAN TODAY AND SIGNS OR SYMPTOMS TO REPORT TO CLINIC OR PRIMARY PHYSICIAN: Exam and discussion by MD.    MEDICATIONS PRESCRIBED:  none  INSTRUCTIONS GIVEN AND DISCUSSED: Report increased fatigue or shortness of breath.  SPECIAL INSTRUCTIONS/FOLLOW-UP: Return for bloodwork in May and to be seen by PA.  Thank you for choosing [facility] to provide your oncology and hematology care.  To afford each patient quality time with our providers, please arrive at least 15 minutes before your scheduled appointment time.  With your help, our goal is to use those 15 minutes to complete the necessary work-up to ensure our physicians have the information they need to help with your evaluation and healthcare recommendations.    Effective January 1st, 2014, we ask that you re-schedule your appointment with our physicians should you arrive 10 or more minutes late for your appointment.  We strive to give you quality time with our providers, and arriving late affects you and other patients whose appointments are after yours.    Again, thank you for choosing [facility].  Our hope is that these requests will decrease the amount of time that you wait before being seen by our physicians.       _____________________________________________________________  Should you have questions after your visit to [facility], please contact our office at [phone] between the hours of 8:30 a.m. and 5:00 p.m.  Voicemails left after 4:30 p.m. will not be returned until the following business day.  For prescription refill requests, have your pharmacy contact our office with your prescription refill request.

## 2012-04-11 NOTE — Progress Notes (Signed)
1. Multifactorial anemia which is very stable. He has both cirrhosis of the liver, GI blood losses, this resulted in iron deficiency, and he has an anemia of chronic disease picture as well. As long as we replace his iron he keeps his hemoglobin in the 11-12 g range typically.  #2. Basal cell carcinoma the right face status post surgical excision followed by postoperative radiation therapy by Dr. Thersa Salt. He did have his teeth removed prior to radiation.  #3.GAVE syndrome #4 diabetes mellitus #5 cirrhosis of the liver with splenomegaly and varices which cause blood loss as well as iron deficiency with losses from his GAVE syndrome #6 depression much improved #7 hypercholesterolemia  His labs have been very very good as well as repeat his iron stores adequately replaced. He looks very good today and his facial exam on the right reveals no obvious nodularity nor does he have any lymphadenopathy in either side of his neck. I do not feel supraclavicular nodes either.  We'll continue to follow him along. I doubt that he will be a transplant candidate since his liver enzymes are holding up fairly nicely. His recent ammonia level is 25 and he continues to take his lactulose on a regular basis. He had blood work last Friday and his primary care physician's office we will obtain that rather than do a venipuncture today.

## 2012-04-11 NOTE — Patient Instructions (Addendum)
Continue present medical regimen  Return here for follow-up in 3 months

## 2012-04-12 ENCOUNTER — Ambulatory Visit: Payer: Self-pay | Admitting: Gastroenterology

## 2012-04-25 ENCOUNTER — Encounter (HOSPITAL_COMMUNITY): Payer: Self-pay | Admitting: Dentistry

## 2012-04-25 ENCOUNTER — Ambulatory Visit (HOSPITAL_COMMUNITY): Payer: Self-pay | Admitting: Dentistry

## 2012-04-25 VITALS — BP 159/77 | HR 66 | Temp 98.3°F

## 2012-04-25 DIAGNOSIS — R432 Parageusia: Secondary | ICD-10-CM

## 2012-04-25 DIAGNOSIS — Z09 Encounter for follow-up examination after completed treatment for conditions other than malignant neoplasm: Secondary | ICD-10-CM

## 2012-04-25 DIAGNOSIS — Z85828 Personal history of other malignant neoplasm of skin: Secondary | ICD-10-CM

## 2012-04-25 DIAGNOSIS — K036 Deposits [accretions] on teeth: Secondary | ICD-10-CM

## 2012-04-25 DIAGNOSIS — Z923 Personal history of irradiation: Secondary | ICD-10-CM

## 2012-04-25 DIAGNOSIS — C44320 Squamous cell carcinoma of skin of unspecified parts of face: Secondary | ICD-10-CM

## 2012-04-25 DIAGNOSIS — Z0189 Encounter for other specified special examinations: Secondary | ICD-10-CM

## 2012-04-25 NOTE — Progress Notes (Signed)
04/25/2012  Patient:            [name] Date of Birth:  1945-08-28 MRN:                [id number]  BP 159/77  Pulse 66  Temp(Src) 98.3 F (36.8 C)  [name] presents for periodic oral examination after radiation therapy. Patient received radiation therapy to the right parotid/cheek, upper right cervical lymph nodes from 01/07/2012 through 03/21/2012 with Dr. Thersa Salt.   REVIEW OF CHIEF COMPLAINTS:  DRY MOUTH: Yes HARD TO SWALLOW: No  HURT TO SWALLOW: No TASTE CHANGES: Taste is coming back. SORES IN MOUTH: None TRISMUS: No problems with trismus. Maximum interincisal opening is 58 mm WEIGHT: 230 pounds  HOME OH REGIMEN:  BRUSHING: Once a day. Patient was encouraged to brush after meals and at bedtime. FLOSSING: Once a day. RINSING: Patient is not using any rinses. Patient to use nonalcohol based mouth rinses as needed. FLUORIDE: Using fluoride at bedtime in his trays. TRISMUS EXERCISES:  Maximum interincisal opening: 58 mm   DENTAL EXAM:  Oral Hygiene:(PLAQUE): Some plaque noted. Oral hygiene improvement was recommended. Patient encouraged to brush after meals and at bedtime. LOCATION OF MUCOSITIS: None noted DESCRIPTION OF SALIVA: Decreased in foamy saliva. Mild to moderate xerostomia. ANY EXPOSED BONE: None noted OTHER WATCHED AREAS: Previous extraction sites have healed in well. DX: Xerostomia, Dysgeusia and Accretions  RECOMMENDATIONS: 1. Brush after meals and at bedtime.  Use fluoride at bedtime. 2. Use trismus exercises as directed. 3. Use Biotene Rinse or salt water/baking soda rinses. 4. Multiple sips of water as needed. 5. Followup with his primary dentist, Dr. Oswaldo Done, in approximately 2 months. Patient to call Dr. Oswaldo Done for an appointment. If partial dentures are to be fabricated, do not start before 06/21/2012. Call if problems before then.  Charlynne Pander, DDS

## 2012-04-25 NOTE — Patient Instructions (Addendum)
Patient to brush teeth after meals and at bedtime.  Patient is to use fluoride trays at bedtime as directed. Patient to perform trismus exercises daily for the next 2 years. Patient followup with his primary dentist, Dr. Oswaldo Done, for an exam and cleaning in approximately 2 months.  Dr. Kristin Bruins   RADIATION THERAPY AND DECISIONS REGARDING YOUR TEETH  Xerostomia (dry mouth) Your salivary glands may be in the filed of radiation.  Radiation may include all or part of your saliva glands.  This will cause your saliva to dry up and you will have a dry mouth.  The dry mouth will be for the rest of your life unless your radiation oncologist tells you otherwise.  Your saliva has many functions:  Saliva wets your tongue for speaking.  It coats your teeth and the inside of your mouth for easier movement.  It helps with chewing and swallowing food.  It helps clean away harmful acid and toxic products made by the germs in your mouth, therefore it helps prevent cavities.  It kills some germs in your mouth and helps to prevent gum disease.  It helps to carry flavor to your taste buds.  Once you have lost your saliva you will be at higher risk for tooth decay and gum disease.  What can be done to help improve your mouth when there's not enough saliva:  1.  Your dentist may give a prescription for Salagen.  It will not bring back all of your saliva but may bring back some of it.  Also your saliva may be thick and ropy or white and foamy. It will not feel like it use to feel.  2.  You will need to swish with water every time your mouth feels dry.  YOU CANNOT suck on any cough drops, mints, lemon drops, candy, vitamin C or any other products.  You cannot use anything other than water to make your mouth feel less dry.  If you want to drink anything else you have to drink it all at once and brush afterwards.  Be sure to discuss the details of your diet habits with your dentist or hygienist.  Radiation  caries: This is decay that happens very quickly once your mouth is very dry due to radiation therapy.  Normally cavities take six months to two years to become a problem.  When you have dry mouth cavities may take as little as eight weeks to cause you a problem.  This is why dental check ups every two months are necessary as long as you have a dry mouth. Radiation caries typically, but not always, start at your gum line where it is hard to see the cavity.  It is therefore also hard to fill these cavities adequately.  This high rate of cavities happens because your mouth no longer has saliva and therefore the acid made by the germs starts the decay process.  Whenever you eat anything the germs in your mouth change the food into acid.  The acid then burns a small hole in your tooth.  This small hole is the beginning of a cavity.  If this is not treated then it will grow bigger and become a cavity.  The way to avoid this hole getting bigger is to use fluoride every evening as prescribed by your dentist.  You have to make sure that your teeth are very clean before you use the fluoride.  This fluoride in turn will strengthen your teeth and prepare them for another  day of fighting acid.  If you develop radiation caries many times the damage is so large that you will have to have all your teeth removed.  This could be a big problem if some of these teeth are in the field of radiation.  Further details of why this could be a big problem will follow.  (See Osteoradionecrosis).  Loss of taste (dysgeusia) This happens to varying degrees once you've had radiation therapy to your jaw region.  Many times taste is not completely lost but becomes limited.  The loss of taste is mostly due to radiation affecting your taste buds.  However if you have no saliva in your mouth to carry the flavor to your taste buds it would be difficult for your taste buds to taste anything.  That is why using water or a prescription for Salagen  prior to meals and during meals may help with some of the taste.  Keep in mind that taste generally returns very slowly over the course of several months or several years after radiation therapy.  Don't give up hope.  Trismus According to your Radiation Oncologist your TMJ or jaw joints are going to be partially or fully in the field of radiation.  This means that over time the muscles that help you open and close your mouth may get stiff.  This will potentially result in your not being able to open your mouth wide enough or as wide as you can open it now.  Le me give you an example of how slowly this happens and how unaware people are of it.  A gentlemen that had radiation therapy two years ago came back to me complaining that bananas are just too large for him to be able to fit them in between his teeth.  He was not able to open wide enough to bite into a banana.  This happens slowly and over a period of time.  What do we do to try and prevent this?  Your dentist will probably give you a stack of sticks called a trismus exercise device .  This stack will help your remind your muscles and your jaw joint to open up to the same distance every day.  Use these sticks every morning when you wake up according to the instructions given by the dentist.   You must use these sticks for at least one to two years after radiation therapy.  The reason for that is because it happens so slowly and keeps going on for about two years after radiation therapy.  Your hospital dentist will help you monitor your mouth opening and make sure that it's not getting smaller.  Osteoradionecrosis (ORN) This is a condition where your jaw bone after having had radiation therapy becomes very dry.  It has very little blood supply to keep it alive.  If you develop a cavity that turns into an abscess or an infection then the jaw bone does not have enough blood supply to help fight the infection.  At this point it is very likely that the  infection could cause the death of your jaw bone.  When you have dead bone it has to be removed.  Therefore you might end up having to have surgery to remove part of your jaw bone, the part of the jaw bone that has been affected.   Healing is also a problem if you are to have surgery in the areas where the bone has had radiation therapy.  The same reasons apply.  If you have surgery you need more blood supply which is not available.  When blood supply and oxygen are not available again, there is a chance for the bone to die.  Occasionally ORN happens on its own with no obvious reason.  This is quite rare.  We believe that patients who continue to smoke and/or drink alcohol have a higher chance of having this bone problem.  Therefore once your jaw bone has had radiation therapy if there are any teeth in that area, you should never have them pulled.  You should also never have any surgery on your teeth or gums in that area unless the oral surgeon or Periodontist is aware of your history of radiation. There is some expensive management techniques that might be used to limit your risks.  The risks for ORN either from infection or spontaneous ( or on it's own) are life long.

## 2012-04-26 ENCOUNTER — Other Ambulatory Visit (HOSPITAL_COMMUNITY): Payer: Self-pay | Admitting: Dentistry

## 2012-05-09 ENCOUNTER — Telehealth: Payer: Self-pay

## 2012-05-09 MED ORDER — ONDANSETRON 4 MG PO TBDP: 4.0000 mg | ORAL_TABLET | Freq: Four times a day (QID) | ORAL | Status: DC | PRN

## 2012-05-09 NOTE — Telephone Encounter (Signed)
Pt called- he went last Monday to get his first hep A/B vaccine. Pt has had vomiting ever since. He is able to keep fluids down, but if he even thinks about eating he starts vomiting. No fever, no diarrhea. He does not have any phenergan or zofran so he has not tried anything like that to see if it helps.   He wants to know if we can call in something or if he needs to be seen?  He also wants to know if this could be a reaction to the vaccine?  Please advise.

## 2012-05-09 NOTE — Telephone Encounter (Signed)
Prescribe Zofran 4 mg tablet May have orally disintegrating tablet prescribed 30 tablets one by mouth or sublingual every 6 hours as needed for nausea. No refills.

## 2012-05-09 NOTE — Telephone Encounter (Signed)
Pt is aware, rx has been sent to [facility].

## 2012-05-20 ENCOUNTER — Encounter: Payer: Self-pay | Admitting: Oncology

## 2012-05-23 ENCOUNTER — Other Ambulatory Visit (HOSPITAL_COMMUNITY): Payer: Self-pay

## 2012-05-24 ENCOUNTER — Non-Acute Institutional Stay (SKILLED_NURSING_FACILITY): Payer: Medicare Other | Admitting: Internal Medicine

## 2012-05-24 ENCOUNTER — Other Ambulatory Visit: Payer: Self-pay

## 2012-05-24 DIAGNOSIS — I482 Chronic atrial fibrillation, unspecified: Secondary | ICD-10-CM

## 2012-05-24 DIAGNOSIS — C44300 Unspecified malignant neoplasm of skin of unspecified part of face: Secondary | ICD-10-CM

## 2012-05-24 DIAGNOSIS — K7460 Unspecified cirrhosis of liver: Secondary | ICD-10-CM

## 2012-05-24 DIAGNOSIS — E785 Hyperlipidemia, unspecified: Secondary | ICD-10-CM

## 2012-05-24 DIAGNOSIS — E119 Type 2 diabetes mellitus without complications: Secondary | ICD-10-CM

## 2012-05-24 DIAGNOSIS — G609 Hereditary and idiopathic neuropathy, unspecified: Secondary | ICD-10-CM

## 2012-05-24 DIAGNOSIS — Z95828 Presence of other vascular implants and grafts: Secondary | ICD-10-CM

## 2012-05-24 DIAGNOSIS — I4891 Unspecified atrial fibrillation: Secondary | ICD-10-CM

## 2012-05-24 DIAGNOSIS — Z9889 Other specified postprocedural states: Secondary | ICD-10-CM

## 2012-05-24 DIAGNOSIS — D509 Iron deficiency anemia, unspecified: Secondary | ICD-10-CM

## 2012-05-24 DIAGNOSIS — K922 Gastrointestinal hemorrhage, unspecified: Secondary | ICD-10-CM

## 2012-05-25 ENCOUNTER — Ambulatory Visit (HOSPITAL_COMMUNITY): Payer: Self-pay | Admitting: Oncology

## 2012-05-27 NOTE — Progress Notes (Signed)
Patient ID: [name], male   DOB: 02-06-1945, 67 y.o.   MRN: [id number] This is an acute visit.  Facility [facility].  Level of care skilled.  Chief complaint. Acute visit status post hospitalization or encephalopathies thought secondary to cirrhosis with history of non-alcoholic fatty liver disease-.  History of present illness.  Patient i s a pleasant elderly male with quite a complex history.  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 procedure done in September 2011 at [facility] has been followed there   Apparently started having increased falls and some mildly increased confusion at home.  There also was a question about his compliance with his medications.  He was seen by hematology and they recommended starting rifaximin.  His mental status improved and bilirubin which was elevated resolved back to baseline.  Also recommendation for lactulose titrated to 2-4 bowel movements a day.  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  MRI in the hospital did not show any residual cancer or metastasis.  Patient did have a poor appetite and he was started on zinc which apparently helped.  He was also started on Remeron for appetite stimulation as well as depression.  He does have a history of pancytopenia thought likely secondary to his liver disease with splenetic sequestration.   viral etiologies were negative i-- peripheral blood smear did not show concerning signs for malignancy.  Pancytopenia gradually improve.  He also has stage III kidney disease baseline  creatinine is around 1.6. Also has history of atrial fibrillation and takes digoxin.  His digoxin level was thought to be low in the hospital but there was some thought he was not taking it at home and this resulted in the lower level.  He did remain rhythm  controlled.  He is considered a poor candidate for anticoagulation secondary to his liver disease  Patient clinically appears to be improved today mental status appears to be stable-  at home apparently he lives with family a brother and a sister   Previous medical history.  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  Diabetes type 2.  Squamous cell carcinoma of the head and neck.  Pancytopenia.  Atrial fibrillation.  Chronic kidney disease.  Insomnia.  Neuropathy.  Encephalopathy.  Depression.  History of thoracic compression fractures.  Mild malnutrition.  Hyperlipidemia.  GERD  History of GI bleed secondary to esophageal varices  Surgical history.  September 2011 as noted TIPS procedure.  2008 Thoracic spine surgery.  Previous history of cholecystectomy and circumcision.  October 2013-skin cancer removal 3  Procedures.  Chest x-ray-loculated left pleural effusion-unchanged mild compression fracture at lower thoracic vertebrae flank by posterior stabilization hardware.  CT head-no acute abnormality.  Marland Kitchen  MRI brain-mild ill-defined enhancement in the right parotid resection site-no metastatic lesions identified.  Medications have been reviewed and include .  Ferrous sulfate 325 mg daily.  Remeron 50 mg each bedtime.  Multivitamin daily.  Zofran 4 mg every 8 hours when necessary.  Rifaximin 550 mg twice a day. 50 mg every 8 hours when necessary.  Trazodone 50 mg each bedtime.  Zinc 200 mg daily for 4  additional days.  Digoxin 0.125 mg daily.  Lasix 40 mg daily.  Neurontin 300 mg each bedtime.  Lactulose 30 mL 3 times a day.  Protonic 40 mg twice a day.  Propanolol 20 mg twice a day.  Zocor 40 mg daily.  Lantus 30 units each bedtime.    Family medical social history.  Patient lives with a brother and sister.  He is divorced.  He denies any history of tobacco or  alcohol use.  Medical history.  Father deceased at age 67 with an MI.  Mother he says he sees  deceased at age 25 had diabetes and COPD.   lost one brother in his 36s to kidney cancer.  He also lost ar brother because of a fall injury    Review of systems.  In general denies any fever or chills.  Skin-is a history of squamous cell carcinoma of the head and neck status post radiation and surgical resection.  Eyes-denies visual changes or pain.  Ear nose mouth and throat-does not complaining of sore throat nasal discharge.  Cardiac-no complaints of chest pain.  Respiratory-does not complaining of cough or increased shortness of breath.  GI-extensive history but has not really complained of abdominal pain today nausea or vomiting says his appetite is gradually improving.  Muscle skeletal-has diffuse weakness but does not complaining of joint pain.  Neurologic denies dizziness or headache does say he has some numbness of his toes especially at night.  Psych-does have a history of depression he has been started on Remeron.  Physical exam.  Temperature is 97.8 pulse 68 respirations 20 blood pressure 132/83.  General this is a somewhat frail elderly male in no distress lying comfortably in bed.  His skin is warm and dry he does have a midline back scar status post surgery-also bruising of his left abdomen.--Left lower back--he is status post surgical resection of squamous cell carcinoma on the head and neck  Eyes---pupils. ROM reactive light sclera and conjunctiva are relatively clear-visual acuity appears grossly intact.  Nose mouth and throat-oropharynx clear mucous membranes moist.  Chest has somewhat reduced breath sounds on the right versus the left no labored breathing or wheezing.  Heart is irregular irregular rate and rhythm without murmur gallop or rub he does not appear to have much lower extremity edema.--Heart rate is in the 50s.  Abdomen is soft does not  appear to be acutely tender with positive bowel sounds-he does have an umbilical hernia.  Muscle skeletal-moves all extremities x4--I did not note any effusions or deformities range of motion appear to be appropriate.  Neurologic is grossly intact speech is clear cranial nerves intact he does have somewhat decreased touch sensation to his tolls bilaterally.  Psych he is alert and oriented x3 pleasant and appropriate  Labs  Admission the hospital WBC 6.2 hemoglobin 12.4 platelets 1:15.  Sodium 138 potassium 4.8 BUN 19 creatinine 1.7 he apparently this went lower during his hospitalization --calcium 8.5 magnesium 1.5 phosphorus 3.4.  Liver function tests showed a total bilirubin of 2.9 ALT 18 AST hemolyzed GGT 42.  PTT 35-INR 1.4.  Assessment and plan.  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  #3-diabetes type 2-he continues on insulin-monitor CBGs so far they have run from the 90s to occasionally 200s baseline appears to be mid 100s.--Update hemoglobin A1c  #4-neuropathy-he does have some symptoms in his  toes-continues on Neurontin.  Continue to monitor.  #5-anemia-continues on iron update CBC  #6-hyperlipidemia is on a statin update  liver function tests.  #7-poor appetite-apparently this is gradually getting better he is on Remeron as well as zinc.  #8-history of edema suspect secondary to cirrhosis-he is on Lasix continue to monitor clinically at this point appears to be relatively stable.  #9-history of GERD gastroesophageal varices with GI bleed-he is on Protonix.  #10-pain issues he does have  Ultram as needed at this point appears to be stable.  #11-history of squamous cell carcinoma-has followup with oncology at [facility]-.  #12-insomnia-continues on trazodone  [id number]-of note greater than one hour spent assessing patient-reviewing hospital records-and formulating plan of care for numerous diagnoses.

## 2012-05-31 ENCOUNTER — Non-Acute Institutional Stay (SKILLED_NURSING_FACILITY): Payer: Medicare Other | Admitting: Internal Medicine

## 2012-05-31 DIAGNOSIS — R269 Unspecified abnormalities of gait and mobility: Secondary | ICD-10-CM

## 2012-05-31 DIAGNOSIS — K7460 Unspecified cirrhosis of liver: Secondary | ICD-10-CM

## 2012-05-31 DIAGNOSIS — E1149 Type 2 diabetes mellitus with other diabetic neurological complication: Secondary | ICD-10-CM

## 2012-05-31 DIAGNOSIS — I4891 Unspecified atrial fibrillation: Secondary | ICD-10-CM

## 2012-06-07 ENCOUNTER — Non-Acute Institutional Stay (SKILLED_NURSING_FACILITY): Payer: Medicare Other | Admitting: Internal Medicine

## 2012-06-07 DIAGNOSIS — D509 Iron deficiency anemia, unspecified: Secondary | ICD-10-CM

## 2012-06-07 DIAGNOSIS — I4891 Unspecified atrial fibrillation: Secondary | ICD-10-CM

## 2012-06-07 DIAGNOSIS — R600 Localized edema: Secondary | ICD-10-CM

## 2012-06-07 DIAGNOSIS — E119 Type 2 diabetes mellitus without complications: Secondary | ICD-10-CM

## 2012-06-07 DIAGNOSIS — D649 Anemia, unspecified: Secondary | ICD-10-CM

## 2012-06-07 DIAGNOSIS — R609 Edema, unspecified: Secondary | ICD-10-CM

## 2012-06-07 DIAGNOSIS — K7460 Unspecified cirrhosis of liver: Secondary | ICD-10-CM

## 2012-06-07 DIAGNOSIS — I482 Chronic atrial fibrillation, unspecified: Secondary | ICD-10-CM

## 2012-06-07 NOTE — Progress Notes (Signed)
Patient ID: [name], male   DOB: 1945/01/16, 67 y.o.   MRN: [id number]   This is an acute visit.  Facility [place].  Level of care skilled.   Chief complaint.   Acute visit secondary to renal insufficiency-followup cirrhosis   History of present illness.  Patient i s a pleasant elderly male with quite a complex history.  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 procedure done in September 2011 at [facility] has been followed there  Been quite stable here and has done relatively well-he is on lactulose and is having regular bowel movements.  Edema appears to be controlled.  Does have a history of chronic kidney disease recent labs show some elevation of his creatinine  On May 30 he did rise to 2.0 to-had been 1.88 back on May 27-update lab on June 2 shows is back down to 1.89.  Back on the 21st his creatinine was 1.47.  He had been on Lasix 40 mg a day this was reduced on May 30 to 20 mg a day   It appears he has actually lost weight since his admission suspect this is fluid related however I do not see an updated weight this week --nurse have been made to obtain.  Today he is resting in bed comfortably-denies any shortness of breath does not appear to have any increased edema despite decreasing the Lasix-his main concern is he wants to go home  .       Previous medical history.  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  Diabetes type 2.  Squamous cell carcinoma of the head and neck.  Pancytopenia.  Atrial fibrillation.  Chronic kidney disease.  Insomnia.  Neuropathy.  Encephalopathy.  Depression.  History of thoracic compression fractures.  Mild malnutrition.  Hyperlipidemia.  GERD  History of GI bleed secondary to esophageal varices  Surgical history.  September 2011 as noted TIPS procedure.  2008  Thoracic spine surgery.  Previous history  of cholecystectomy and circumcision.  October 2013-skin cancer removal 3  Procedures.  Chest x-ray-loculated left pleural effusion-unchanged mild compression fracture at lower thoracic vertebrae flank by posterior stabilization hardware.  CT head-no acute abnormality.  Marland Kitchen  MRI brain-mild ill-defined enhancement in the right parotid resection site-no metastatic lesions identified.   Medications have been reviewed and include .  Ferrous sulfate 325 mg daily.  Remeron 50 mg each bedtime.  Multivitamin daily.  Zofran 4 mg every 8 hours when necessary.  Rifaximin 550 mg twice a day. 50 mg every 8 hours when necessary.  Trazodone 50 mg each bedtime.  Zinc 200 mg daily for 4 additional days.  Digoxin 0.125 mg daily.  Lasix 20 mg daily.  Neurontin 300 mg each bedtime.  Lactulose 30 mL 3 times a day.  Protonic 40 mg twice a day.  Propanolol 20 mg twice a day.  Zocor 40 mg daily.  Lantus 30 units each bedtime.   Family medical social history.  Patient lives with a brother and sister.  He is divorced.  He denies any history of tobacco or alcohol use.   Medical history.  Father deceased at age 79 with an MI.  Mother he says he sees deceased at age 48 had diabetes and COPD.  lost one brother in his 60s to kidney cancer.  He also lost ar brother because of a fall injury   Review of systems.  In general  denies any fever or chills.  Skin-is a history of squamous cell carcinoma of the head and neck status post radiation and surgical resection.  Eyes-denies visual changes or pain.  Ear nose mouth and throat-does not complaining of sore throat nasal discharge.  Cardiac-no complaints of chest pain.  Respiratory-does not complaining of cough or increased shortness of breath.  GI-extensive history but has not really complained of abdominal pain today nausea or vomiting says his appetite is gradually improving--.  Muscle skeletal-says he has gotten stronger denies any joint pain.  Neurologic  denies dizziness or headache or numbness tonight  Psych-does have a history of depression he has been started on Remeron--apparently this is improving.   Physical exam.  Temperature 96.5 pulse 67 respirations 18 blood pressure 126/50 weight is 220 this was back on May 26 back on the 20th was 223.  General this is a somewhat frail elderly male in no distress lying comfortably in bed.  His skin is warm and dry--he is status post surgical resection of squamous cell carcinoma on the head and neck  Eyes---pupils. ROM reactive light sclera and conjunctiva are relatively clear-visual acuity appears grossly intact.  Nose mouth and throat-oropharynx clear mucous membranes moist.  Chest -- no labored breathing or wheezing. -Clear to auscultation Heart is irregular irregular rate and rhythm without murmur gallop or rub he does not appear to have much lower extremity edema.--Heart rate is in the 60s.  Abdomen is soft does not appear to be acutely tender with positive bowel sounds-he does have an umbilical hernia.  Muscle skeletal-moves all extremities x4--I did not note any effusions or deformities range of motion appear to be appropriate.  Neurologic is grossly intact speech is clear cranial nerves intact .  Psych he is alert and oriented x3 pleasant and appropriate   Labs   06/05/2012.  BUN 21 creatinine 1.89 sodium 140 potassium 5.1.  06/02/2012.  BUN 21 creatinine 2.0 to sodium 139 potassium 4.3.  Weight 27 2014.  BUN 22 creatinine 1.88 sodium 138 potassium 4.2.  05/24/2012.  BUN 15 creatinine 1.47 sodium 140 potassium 3.8.   Admission the hospital WBC 6.2 hemoglobin 12.4 platelets 1:15.  Sodium 138 potassium 4.8 BUN 19 creatinine 1.7 he apparently this went lower during his hospitalization --calcium 8.5 magnesium 1.5 phosphorus 3.4.  Liver function tests showed a total bilirubin of 2.9 ALT 18 AST hemolyzed GGT 42.  PTT 35-INR 1.4.   Assessment and plan.  Number -1- history of  cirrhosis secondary to non-alcoholic fatty liver disease-does continue on lactulose as well as rifaximin --this appears stable His mental status appears to be stable he is alert and oriented pleasant and talkative   2-renal insufficiency-creatinine appears to be waffling  however is down from level on May 30--Lasix as been reduced-this is a balancing act-clinically he appears stable Will update labs later this week to ensure stability #3-history of atrial fibrillation-considered a poor anticoagulation candidate secondary to liver issues-he does continue on digoxin and this appears to be rate controlled.-Dig level was 0.7 on the 21st.  #4-diabetes type 2-he continues on insulin-monitor CBGs so far they have run from the 90s to occasionally 200s baseline appears to be mid 100s.--Update hemoglobin A1c  #4-neuropathy-continues on Neurontin.  Continue to monitor--does not complaining of pain today.  #5-anemia-continues on iron update CBC  #6-hyperlipidemia is on a statin update liver function tests.  #7-poor appetite-apparently this is gradually getting better he is on Remeron as well as zinc suspect weight loss his fluid related  order has been written for weight 2 times a week.  #8-history of edema suspect secondary to cirrhosis-he is on Lasix continue to monitor clinically at this point appears to be relatively stable.  #9-history of GERD gastroesophageal varices with GI bleed-he is on Protonix.  #10-pain issues he does have Ultram as needed at this point appears to be stable.  #11-history of squamous cell carcinoma-has followup with oncology at [facility]-.  #12-insomnia-continues on trazodone -apparently this is effective  [id number]  .

## 2012-06-12 ENCOUNTER — Other Ambulatory Visit (HOSPITAL_COMMUNITY): Payer: Self-pay

## 2012-06-19 ENCOUNTER — Telehealth (HOSPITAL_COMMUNITY): Payer: Self-pay | Admitting: Oncology

## 2012-06-19 ENCOUNTER — Other Ambulatory Visit (HOSPITAL_COMMUNITY): Payer: Self-pay | Admitting: Oncology

## 2012-06-19 ENCOUNTER — Telehealth: Payer: Self-pay | Admitting: Internal Medicine

## 2012-06-19 DIAGNOSIS — I4891 Unspecified atrial fibrillation: Secondary | ICD-10-CM

## 2012-06-19 DIAGNOSIS — E119 Type 2 diabetes mellitus without complications: Secondary | ICD-10-CM

## 2012-06-19 DIAGNOSIS — C4449 Other specified malignant neoplasm of skin of scalp and neck: Secondary | ICD-10-CM

## 2012-06-19 DIAGNOSIS — K7460 Unspecified cirrhosis of liver: Secondary | ICD-10-CM

## 2012-06-19 NOTE — Telephone Encounter (Signed)
Pt's wife called this afternoon to let us know that patient has been released from the hospital and they are going to give the lab orders to the home health nurse to draw the blood and send the results to [place].

## 2012-06-19 NOTE — Progress Notes (Signed)
Patient ID: [name], male   DOB: April 10, 1945, 67 y.o.   MRN: [id number]           HISTORY & PHYSICAL  DATE:  05/31/2012  FACILITY: [facility]   LEVEL OF CARE:   SNF   CHIEF COMPLAINT:  Status post admission to [facility], 05/16/2012 through 05/22/2012.   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   He had a TIPS procedure in September 2011 and has been followed by a hepatologist (Dr. Ruffin Frederick) at [facility].  He had been declining for several weeks and was admitted with mild jaundice, probable hepatic encephalopathy.  He underwent an ultrasound of his liver with PEP protocol showing patent tips with some splenomegaly.  He was started on Rifaximin.  The patient's mental status gradually improved and his bilirubin went back to normal.  He is on lactulose titrated to 2-4 bowel movements per day.  He seems to be improving.  He tells me he was on a transplant list until he was discovered to have squamous cell CA on the right side of his face.  Viewing his records through [facility] shows he is followed by Dr. Jena Gauss of GI in [place].  He has known varices, splenomegaly.   PAST MEDICAL HISTORY/PROBLEM LIST:  Cirrhosis of the liver secondary to NAFLD.    Squamous cell CA of the head and neck.  This was subsequently resected.  Had external beam radiation.  An MRI of the brain showed no signs of residual cancer or metastasis.    History of depression, on Remeron.  History of pancytopenia, felt to be secondary to splenic sequestration from his known liver disease and portal hypertension.     History of chronic renal failure with a baseline creatinine of 1.78.    History of atrial fibrillation, on digoxin.  Follows with Dr. Dietrich Pates, I believe.    Mild malnutrition.   Gastroesophageal reflux disease.   GAVE syndrome.  History of multifactorial anemia.  Hemoglobin at discharge 12.5, platelets 115, white  count 6.2.    RADIOLOGY:    Chest x-ray suggested loculated left pleural effusion, ill-defined retrocardiac left basilar opacity.     CT scan of the head showed a right mastoid effusion.    MRI of the brain showed mild ill-defined enhancement in the right parotid resection site.  The area is incompletely evaluated, but seemed to have a right mastoid effusion.  No metastatic lesions to the brain.    CURRENT MEDICATIONS:  Discharge medications include:   Digoxin 0.125 q.d.   Ferrous sulfate 325 daily.   Lasix 40 q.d.   Neurontin 300  t.i.d.   Lactulose 30 mL three times daily.   Lantus insulin 30 U at h.s.   Remeron 15 q.h.s.   Ondansetron 1 tablet, 4 mg q.8 hours p.r.n.   Protonix 40 b.i.d.   Propranolol 20 mg b.i.d.   Rifaximin 550 b.i.d.   Tramadol 50 q.8 h p.r.n.   Trazodone 50 q.h.s.   Zocor 40 q.d.   SOCIAL HISTORY: HOUSING:  He tells me he lives with family members in [place], including two siblings and a son.  FUNCTIONAL STATUS:  His exact functional status is unknown.    He was walking with a cane, according to him.  Not sure if he was giving his insulin or not.     FAMILY HISTORY:  None related by the patient.      REVIEW OF  SYSTEMS:   HEENT:  No headache or facial pain.   CHEST/RESPIRATORY:  No shortness of breath.  CARDIAC:   No chest pain. GI:  States he has had one bowel movement today.  Normally, he has 2-3.  He tells me he does not like using lactulose due to "diarrhea".   GU:  No dysuria.   NEUROLOGICAL:   Does not have good sensation in his feet.  Says his gait is actually quite steady.    PHYSICAL EXAMINATION:   VITAL SIGNS:   PULSE:  84.   RESPIRATIONS:  18. GENERAL APPEARANCE:  Pleasant man in no distress.   HEENT:   HEAD:  Previous radiation and surgery to the right side of his head.   MOUTH/THROAT:   Oral exam shows dentures.  No lesions.   NECK/THYROID:  No lymphadenopathy or thyroid.   CHEST/RESPIRATORY:  Breathing is even and  unlabored.  Breath sounds are clear to auscultation bilaterally.  Chest is nontender and normal in contour.  CARDIOVASCULAR:  CARDIAC:   Heart sounds are normal.  No dehydration is evident.   GASTROINTESTINAL:  HERNIA:  Mild to moderate umbilical hernia which is nontender.   LIVER/SPLEEN/KIDNEYS:  No liver.  His spleen is not palpable.  He has no scleral icterus.   GENITOURINARY:  BLADDER:   Not distended.  There is no costovertebral angle tenderness.   NEUROLOGICAL:    SENSATION/STRENGTH:  Extremities:  Decreased sensation to light touch.   DEEP TENDON REFLEXES:  He is diffusely hyporeflexic.   BALANCE/GAIT:  Gait is wide-based and very unsteady.  I did not attempt to walk him with the walker.    CIRCULATION:   EDEMA/VARICOSITIES:  Extremities:  Some degree of venous stasis with a healing wound on the right anterior leg.   ARTERIAL:  Peripheral pulses are difficult to feel.   PSYCHIATRIC:   MENTAL STATUS:   He seems quite bright and alert, cognizant and aware.  I note he did have very slight asterixis in the right hand.    ASSESSMENT/PLAN:  End-stage hepatic cirrhosis secondary to nonalcoholic fatty liver disease.  He is on Rifaximin and lactulose.  We will monitor his bowel movement frequency.  Probably mild asterixis.  His only accompaniment  apparently is portal hypertension.  He is status post TIPS procedure.  This was patent, as noted above.    Tremendous gait ataxia.  The reason behind this is not totally clear, although I suspect he does have diabetic neuropathy which is certainly playing a role.    Atrial fibrillation.  He is not on anticoagulation, nor do I think he should be at this point.  His rate is controlled.    Diabetic neuropathy.  On insulin.   Depression with poor oral intake and protein calorie malnutrition.  I believe his discharge albumin was 3.2.  He is on Remeron 15 mg.    On propranolol, I am assuming for prophylaxis for varices.    Multifactorial anemia.   Has seen Hematology in the past.  I will need to check this.    The patient did not look badly as far as his bedside exam was concerned until I stood him on his feet.  I was surprised at how unsteady he is.  Nothing is really mentioned about this in the hospital.  We will have to see how he does in therapy.  At least a component of diabetic neuropathy.  He should be checked for orthostatic hypotension.     CPT CODE: 16109

## 2012-06-19 NOTE — Telephone Encounter (Signed)
CBC diff, Ferritin order called in to [facility]

## 2012-06-28 ENCOUNTER — Telehealth: Payer: Self-pay | Admitting: Internal Medicine

## 2012-06-28 NOTE — Telephone Encounter (Signed)
Home health nurse called for [name] to set up OV and is aware of that. Also, patient has been out 3-4 days of his Xifaxan and is in the donut hole with his insurance. Pt has papers for [place] to fill out to help with the expense and also would like to get samples if possible. Please call [phone]

## 2012-06-28 NOTE — Telephone Encounter (Signed)
2 boxes of xifaxan at the front desk for the pt. He is going to bring his assistance forms by for the xifaxan for [place] to sign so he can get some assistance paying for the medication.

## 2012-07-24 ENCOUNTER — Encounter: Payer: Self-pay | Admitting: Oncology

## 2012-07-28 ENCOUNTER — Ambulatory Visit (INDEPENDENT_AMBULATORY_CARE_PROVIDER_SITE_OTHER): Payer: Medicare Other | Admitting: Internal Medicine

## 2012-07-28 ENCOUNTER — Encounter: Payer: Self-pay | Admitting: Internal Medicine

## 2012-07-28 VITALS — BP 115/61 | HR 82 | Temp 97.4°F | Ht 73.0 in | Wt 211.2 lb

## 2012-07-28 DIAGNOSIS — K7460 Unspecified cirrhosis of liver: Secondary | ICD-10-CM

## 2012-07-28 DIAGNOSIS — K7290 Hepatic failure, unspecified without coma: Secondary | ICD-10-CM

## 2012-07-28 DIAGNOSIS — K219 Gastro-esophageal reflux disease without esophagitis: Secondary | ICD-10-CM

## 2012-07-28 NOTE — Progress Notes (Signed)
Primary Care Physician:  Kirk Ruths, MD Primary Gastroenterologist:  Dr. Jena Gauss  Pre-Procedure History & Physical: HPI:  [name] is a 67 y.o. male here for followup of [place]/cirrhosis. Patient's had some time since she was last seen here. He states he went down to [facility] they gave him vaccines against hepatitis A and B. He became weak. Could not walk. Endo being admitted back to the hospital down there. For 5 days. Was evaluated for possible brain tumor.  Had an MRI and a CT without anything conclusive been found. It is not known whether or not he came off of propranolol at this time. His medication list may be inaccurate and incomplete. He spent 4 weeks a nursing home in [place] prior to being discharged home recently. He came out all side effects and because he could not afford it. He has not had a fever or chills no melena or hematochezia. He has lost about 19 pounds since he was last seen here. I do have quite a bit of raw data from [facility] but no discharge summary from his recent hospitalization.  Reflux symptoms well controlled on Protonix.  Past Medical History  Diagnosis Date  . Hypertension     negative stress nuclear study in 02/2007; normal coronaries in 2006; Echo in 2006-aortic sclerosis, Mild biatrial enlargement; normal LV  . Pulmonary hypertension     Mild pulmonary hypertension with normal PVR by 2010 cath [facility])  . Hyperlipidemia   . Syncope 2008    orthostatic hypotension  . Diabetes mellitus     insulin; onset in 1999; retinopathy, neuropathy and nephropathy  . Chronic atrial fibrillation     onset in 1995; anticoagulation discontinued in 2006  . Crohn's disease     -lower GI bleed in 08/2002;  Pentasa stopped by Dr. Brooke Dare at [facility] ; clinical remission.  . Obesity   . NASH (nonalcoholic steatohepatitis)     cirrhosis with esophageal varices; h/o GI bleeding. EGD-2010 grade 2 esophageal varices-banded with good hemostasis; portal gastropathy, friable gastric mucosa;  hiatal hernia.  EGD- recurrent melena one week later showed 2 eschars at previously placed bands, decompressed esophageal varices grade 1-2,four columns, no bleeding stigmata, normal appearing small bowel, pt has had 1 Hep A/B vaccine.    . Esophageal varices     s/p band ligation multiple times, Portal Hypertensive gastropathy;  . S/P TIPS (transjugular intrahepatic portosystemic shunt)     06/2009-TIPS procedure at [facility]  . GI bleed     07/2009-admitted with recurrent upper GI bleed secondary to esophageal varices-banded x3  . Pleural effusion, left     s/p thoracentesis-multiple  . Depression   . Anemia     Iron def requiring parenteral iron or injections in past, followed by Dr. Glenford Peers; B12 def  . Duodenal ulcer 1980s  . Ascites   . Hiatal hernia 12/02/2009  . Esophageal erosions     consistent with mild erosive reflux esophagitis  . Portal hypertensive gastropathy   . 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 Jena Gauss  . Cancer 10/2011  . Squamous cell carcinoma of face   . Pneumonia     long time ago  . Shortness of breath   . Chronic kidney disease     stage III; creatinine of 1.6 in 8/07, 1.35 and 05/2008  . Nephrolithiasis     cystoscopy with stone extraction in 1990,   . GERD (gastroesophageal reflux disease)   . Arthritis  knees & ankles   . S/P radiation therapy 01/07/12 thru 03/21/2012    Dr. Thersa Salt; Right cheek/parotid and neck nodes    Past Surgical History  Procedure Laterality Date  . Tips procedure  09/16/09  . 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 Cholecystectomy    . Circumcision  -1992  . Colonoscopy  -2008    Reportedly normal, done in [place]  . Multiple thoracenteses    . Esophagogastroduodenoscopy  12/02/2009    distal esophageal erosions, small hiatal hernia  . Tips procedure  2011  . Colonoscopy  01/26/2011    RMR: adequate preparation.  normal rectum. Somewhat   diffusely friable colonic mucosa. A single 4 mm polyp in the ascending segment. couple of mucosal breaks in the distal 10 cm of terminal ileum with  a couple of erosions at the junction  the TI  and colon.. Tubular adenoma, chronic ileitis. Next TCS 01/2016.  Marland Kitchen Esophagogastroduodenoscopy  01/26/2011    RMR: Decompressed/abscent esophagel varices s/p TIPS. small hh. GAVE. Procedure: ESOPHAGOGASTRODUODENOSCOPY (EGD);  Surgeon: Corbin Ade, MD;  [facility];  Service: Endoscopy;  Laterality: N/A;  . Esophagogastroduodenoscopy  04/22/2011    RMR: GAVE.-status post APC ablation as described above  . Esophagogastroduodenoscopy  05/20/2011    RMR: Portal gastropathy.  GAVE - status post APC ablation  . Hot hemostasis  05/20/2011    Procedure: HOT HEMOSTASIS (ARGON PLASMA COAGULATION/BICAP);  Surgeon: Corbin Ade, MD;  [facility];  Service: Endoscopy;  Laterality: N/A;  . Esophagogastroduodenoscopy  07/14/2005    Persisting two columns of grade 2 esophageal varices with one cherry red spot on one of these columns distally.  Otherwise normal  . Skin cancer excision  Oct 2013    large lesion rt side of face  . Cardiac catheterization       04/12/2008 [facility]): no significant CAD, mild pulmonary hypertension with normal PVR, normal pulmonary capillary wedge pressure, normal left ventricular end-diastolic pressure.  . Multiple extractions with alveoloplasty  01/14/2012    Procedure: MULTIPLE EXTRACION WITH ALVEOLOPLASTY;  Surgeon: Charlynne Pander, DDS;  Location: [facility] OR;  Service: Oral Surgery;  Laterality: N/A;  Extraction of tooth #'s [phone] with alveoloplasty and gross debridement of remaining teeth    Prior to Admission medications   Medication Sig Start Date End Date Taking? Authorizing Provider  ALPRAZolam Prudy Feeler) 0.5 MG tablet Take 0.5 mg by mouth 2 (two) times daily as needed.  02/08/11  Yes Historical Provider, MD  betamethasone dipropionate (DIPROLENE) 0.05 % cream   03/07/12  Yes Historical Provider, MD  DIGOX 0.125 MG tablet TAKE ONE TABLET BY MOUTH DAILY. 03/27/12  Yes Jonelle Sidle, MD  furosemide (LASIX) 40 MG tablet Take 40 mg by mouth daily.  01/26/12  Yes Jodelle Gross, NP  gabapentin (NEURONTIN) 300 MG capsule Take 300 mg by mouth at bedtime.  05/29/10  Yes Historical Provider, MD  insulin aspart (NOVOLOG) 100 UNIT/ML injection Inject 0-20 Units into the skin 2 (two) times daily. Patient is on Sliding Scale   Yes Historical Provider, MD  insulin glargine (LANTUS) 100 UNIT/ML injection Inject 60-70 Units into the skin at bedtime.    Yes Historical Provider, MD  lactulose (CHRONULAC) 10 GM/15ML solution Take 20 g by mouth 4 (four) times daily.    Yes Historical Provider, MD  lisinopril (PRINIVIL,ZESTRIL) 5 MG tablet Take 5 mg by mouth daily.  04/07/12  Yes Historical Provider, MD  mirtazapine (REMERON) 15 MG tablet Take 15 mg by mouth at bedtime.  07/17/12  Yes Historical Provider, MD  ondansetron (ZOFRAN ODT) 4 MG disintegrating tablet Take 1 tablet (4 mg total) by mouth every 6 (six) hours as needed for nausea. 05/09/12  Yes Corbin Ade, MD  pantoprazole (PROTONIX) 40 MG tablet TAKE (1) TABLET BY MOUTH TWICE DAILY. 01/10/12  Yes Joselyn Arrow, NP  propranolol (INDERAL) 10 MG tablet Take 0.5 tablets (5 mg total) by mouth 2 (two) times daily. 01/19/12  Yes Dyann Kief, PA-C  simvastatin (ZOCOR) 40 MG tablet Take 40 mg by mouth at bedtime.    Yes Historical Provider, MD  traZODone (DESYREL) 50 MG tablet Take 50 mg by mouth at bedtime.  07/17/12  Yes Historical Provider, MD    Allergies as of 07/28/2012 - Review Complete 07/28/2012  Allergen Reaction Noted  . Codeine Other (See Comments)   . Tricode ar (pseudoeph-chlorphen-codeine)  05/27/2012  . Other Rash 05/05/2010    Family History  Problem Relation Age of Onset  . Colon cancer Neg Hx   . Inflammatory bowel disease Neg Hx   . Liver disease Neg Hx   . Anesthesia problems Neg Hx   .  Hypotension Neg Hx   . Malignant hyperthermia Neg Hx   . Pseudochol deficiency Neg Hx     History   Social History  . Marital Status: Divorced    Spouse Name: N/A    Number of Children: 1  . Years of Education: N/A   Occupational History  . retired     disabled   Social History Main Topics  . Smoking status: Never Smoker   . Smokeless tobacco: Never Used  . Alcohol Use: No  . Drug Use: No  . Sexually Active: Yes    Birth Control/ Protection: None   Other Topics Concern  . Not on file   Social History Narrative   The patient is divorced. The patient had one son.   Patient denies ever having smoked or used smokeless tobacco products. Patient denies current use of alcohol.      Mother and father are both deceased.    Review of Systems: See HPI, otherwise negative ROS  Physical Exam: BP 115/61  Pulse 82  Temp(Src) 97.4 F (36.3 C) (Oral)  Ht 6\' 1"  (1.854 m)  Wt 211 lb 3.2 oz (95.8 kg)  BMI 27.87 kg/m2 General:   Markedly disheveled individual who looks older than his stated chronological age. He is awake alert and oriented to place time and situation. However he does have asterixis Skin:  Intact without significant lesions or rashes. Eyes:  Sclera clear, no icterus.   Conjunctiva pink. Ears:  Normal auditory acuity. Nose:  No deformity, discharge,  or lesions. Mouth:  No deformity or lesions. Neck:  Supple; no masses or thyromegaly. No significant cervical adenopathy. Lungs:  Clear throughout to auscultation.   No wheezes, crackles, or rhonchi. No acute distress. Heart:  Regular rate and rhythm; no murmurs, clicks, rubs,  or gallops. Abdomen: Non-distended, normal bowel sounds.  Soft and nontender without appreciable mass or hepatosplenomegaly.  Pulses:  Normal pulses noted. Extremities:  Without clubbing or edema.  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  Patient admitted to [facility] back in may of this year for what sounds like encephalopathy.  I just received a copy of the [facility] discharge summary. Indeed, he was admitted with apparent encephalopathy. Started back on Xifaxan along  with lactulose. Of course, he cannot afford Xifaxan and he has not been taking Xifaxan. He apparently continues on propranolol although Doppler studies during that hospitalization indicated patency of the TIPS. He does not appear acutely ill at this time although he does have asterixis. He has lost a good 19 pounds since his last office visit here.  GERD controlled on Protonix.  Recommendations:   No change in his medical regimen now other than providing samples of Xifaxan and can provide him enough for 14 day supply. We'll have for him to get a CBC, BEMET and serum ammonia in about 2 weeks and at that time we'll have him follow up with [place] to reassess. His overall prognosis remains quite guarded.  [facility] discharge summary is helpful and will have it incorporated into our medical record.

## 2012-07-28 NOTE — Patient Instructions (Signed)
No change in your regimen for now  Need list of current medications  Need Discharge summary from [facility]  Need CBC, BMET and ammonia just before OV here 2 weeks

## 2012-07-31 ENCOUNTER — Other Ambulatory Visit: Payer: Self-pay

## 2012-07-31 DIAGNOSIS — K7460 Unspecified cirrhosis of liver: Secondary | ICD-10-CM

## 2012-08-08 ENCOUNTER — Encounter: Payer: Self-pay | Admitting: Gastroenterology

## 2012-08-08 ENCOUNTER — Ambulatory Visit (INDEPENDENT_AMBULATORY_CARE_PROVIDER_SITE_OTHER): Payer: Medicare Other | Admitting: Gastroenterology

## 2012-08-08 VITALS — BP 120/68 | HR 84 | Temp 98.2°F | Ht 73.0 in | Wt 210.4 lb

## 2012-08-08 DIAGNOSIS — K7460 Unspecified cirrhosis of liver: Secondary | ICD-10-CM

## 2012-08-08 LAB — BASIC METABOLIC PANEL
BUN: 28 mg/dL — ABNORMAL HIGH (ref 6–23)
CO2: 27 mEq/L (ref 19–32)
Calcium: 8.6 mg/dL (ref 8.4–10.5)
Chloride: 107 mEq/L (ref 96–112)
Creat: 2.02 mg/dL — ABNORMAL HIGH (ref 0.50–1.35)
Glucose, Bld: 193 mg/dL — ABNORMAL HIGH (ref 70–99)

## 2012-08-08 LAB — CBC WITH DIFFERENTIAL/PLATELET
Eosinophils Absolute: 0.1 10*3/uL (ref 0.0–0.7)
Eosinophils Relative: 2 % (ref 0–5)
HCT: 31.7 % — ABNORMAL LOW (ref 39.0–52.0)
Hemoglobin: 10.5 g/dL — ABNORMAL LOW (ref 13.0–17.0)
Lymphocytes Relative: 11 % — ABNORMAL LOW (ref 12–46)
Lymphs Abs: 0.6 10*3/uL — ABNORMAL LOW (ref 0.7–4.0)
MCH: 31.3 pg (ref 26.0–34.0)
MCV: 94.3 fL (ref 78.0–100.0)
Monocytes Absolute: 0.6 10*3/uL (ref 0.1–1.0)
Monocytes Relative: 11 % (ref 3–12)
Platelets: 104 10*3/uL — ABNORMAL LOW (ref 150–400)
RBC: 3.36 MIL/uL — ABNORMAL LOW (ref 4.22–5.81)
WBC: 5.7 10*3/uL (ref 4.0–10.5)

## 2012-08-08 NOTE — Assessment & Plan Note (Signed)
67 year old with NASH cirrhosis s/p TIPS with recent US May 2014 at [facility] noting patency and negative for [facility]. Lasix has been held due to rising Cr (2.02). No edema or ascites appreciated on exam. Difficulty taking Xifaxan due to cost, and we are providing samples as needed.   Recommend titrating lactulose to achieve 3 soft bowel movements daily. Repeat BMP next week; will likely need to decrease Lasix to 10-20 mg daily (he was on 20 mg daily). Next [place] of abdomen in Nov 2014. Return in 6 weeks.

## 2012-08-08 NOTE — Progress Notes (Signed)
Referring Provider: Karleen Hampshire, MD Primary Care Physician:  Kirk Ruths, MD Primary GI: Dr. Jena Gauss   Chief Complaint  Patient presents with  . Follow-up    HPI:   [name] is a 67 year old male with a history of NASH cirrhosis, s/p TIPS for refractory ascites, Crohn's disease in remission. Unable to take Xifaxan routinely due to cost. Received Hep A and B vaccines at [facility]. Last seen July 25th. Was admitted May 2014 to [facility] due to encephalopathy.   3 bowel movements per day. Lactulose once a day. Xifaxan BID. Denies "bad episodes" of confusion. Daughter confirms this. Holding Lasix per our request for the next 5 days. No edema.     Past Medical History  Diagnosis Date  . Hypertension     negative stress nuclear study in 02/2007; normal coronaries in 2006; Echo in 2006-aortic sclerosis, Mild biatrial enlargement; normal LV  . Pulmonary hypertension     Mild pulmonary hypertension with normal PVR by 2010 cath [facility])  . Hyperlipidemia   . Syncope 2008    orthostatic hypotension  . Diabetes mellitus     insulin; onset in 1999; retinopathy, neuropathy and nephropathy  . Chronic atrial fibrillation     onset in 1995; anticoagulation discontinued in 2006  . Crohn's disease     -lower GI bleed in 08/2002;  Pentasa stopped by Dr. Brooke Dare at [facility] ; clinical remission.  . Obesity   . NASH (nonalcoholic steatohepatitis)     cirrhosis with esophageal varices; h/o GI bleeding. EGD-2010 grade 2 esophageal varices-banded with good hemostasis; portal gastropathy, friable gastric mucosa; hiatal hernia.  EGD- recurrent melena one week later showed 2 eschars at previously placed bands, decompressed esophageal varices grade 1-2,four columns, no bleeding stigmata, normal appearing small bowel, pt has had 1 Hep A/B vaccine.    . Esophageal varices     s/p band ligation multiple times, Portal Hypertensive gastropathy;  . S/P TIPS (transjugular intrahepatic portosystemic shunt)    06/2009-TIPS procedure at [facility]  . GI bleed     07/2009-admitted with recurrent upper GI bleed secondary to esophageal varices-banded x3  . Pleural effusion, left     s/p thoracentesis-multiple  . Depression   . Anemia     Iron def requiring parenteral iron or injections in past, followed by Dr. Glenford Peers; B12 def  . Duodenal ulcer 1980s  . Ascites   . Hiatal hernia 12/02/2009  . Esophageal erosions     consistent with mild erosive reflux esophagitis  . Portal hypertensive gastropathy   . 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 Jena Gauss  . Cancer 10/2011  . Squamous cell carcinoma of face   . Pneumonia     long time ago  . Shortness of breath   . Chronic kidney disease     stage III; creatinine of 1.6 in 8/07, 1.35 and 05/2008  . Nephrolithiasis     cystoscopy with stone extraction in 1990,   . GERD (gastroesophageal reflux disease)   . Arthritis     knees & ankles   . S/P radiation therapy 01/07/12 thru 03/21/2012    Dr. Thersa Salt; Right cheek/parotid and neck nodes    Past Surgical History  Procedure Laterality Date  . Tips procedure  09/16/09  . 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 Cholecystectomy    . Circumcision  -1992  . Colonoscopy  -2008  Reportedly normal, done in [place]  . Multiple thoracenteses    . Esophagogastroduodenoscopy  12/02/2009    distal esophageal erosions, small hiatal hernia  . Tips procedure  2011  . Colonoscopy  01/26/2011    RMR: adequate preparation.  normal rectum. Somewhat  diffusely friable colonic mucosa. A single 4 mm polyp in the ascending segment. couple of mucosal breaks in the distal 10 cm of terminal ileum with  a couple of erosions at the junction  the TI  and colon.. Tubular adenoma, chronic ileitis. Next TCS 01/2016.  Marland Kitchen Esophagogastroduodenoscopy  01/26/2011    RMR: Decompressed/abscent esophagel varices s/p TIPS. small hh. GAVE. Procedure:  ESOPHAGOGASTRODUODENOSCOPY (EGD);  Surgeon: Corbin Ade, MD;  [facility];  Service: Endoscopy;  Laterality: N/A;  . Esophagogastroduodenoscopy  04/22/2011    RMR: GAVE.-status post APC ablation as described above  . Esophagogastroduodenoscopy  05/20/2011    RMR: Portal gastropathy.  GAVE - status post APC ablation  . Hot hemostasis  05/20/2011    Procedure: HOT HEMOSTASIS (ARGON PLASMA COAGULATION/BICAP);  Surgeon: Corbin Ade, MD;  [facility];  Service: Endoscopy;  Laterality: N/A;  . Esophagogastroduodenoscopy  07/14/2005    Persisting two columns of grade 2 esophageal varices with one cherry red spot on one of these columns distally.  Otherwise normal  . Skin cancer excision  Oct 2013    large lesion rt side of face  . Cardiac catheterization       04/12/2008 [facility]): no significant CAD, mild pulmonary hypertension with normal PVR, normal pulmonary capillary wedge pressure, normal left ventricular end-diastolic pressure.  . Multiple extractions with alveoloplasty  01/14/2012    Procedure: MULTIPLE EXTRACION WITH ALVEOLOPLASTY;  Surgeon: Charlynne Pander, DDS;  Location: [facility] OR;  Service: Oral Surgery;  Laterality: N/A;  Extraction of tooth #'s [phone] with alveoloplasty and gross debridement of remaining teeth    Current Outpatient Prescriptions  Medication Sig Dispense Refill  . ALPRAZolam (XANAX) 0.5 MG tablet Take 0.5 mg by mouth 2 (two) times daily as needed.       Marland Kitchen DIGOX 0.125 MG tablet TAKE ONE TABLET BY MOUTH DAILY.  30 tablet  6  . gabapentin (NEURONTIN) 300 MG capsule Take 300 mg by mouth at bedtime.       . insulin aspart (NOVOLOG) 100 UNIT/ML injection Inject 0-20 Units into the skin 2 (two) times daily. Patient is on Sliding Scale      . insulin glargine (LANTUS) 100 UNIT/ML injection Inject 60-70 Units into the skin at bedtime.       Marland Kitchen lactulose (CHRONULAC) 10 GM/15ML solution Take 20 g by mouth 4 (four) times daily.       Marland Kitchen lisinopril  (PRINIVIL,ZESTRIL) 5 MG tablet Take 5 mg by mouth daily.       . mirtazapine (REMERON) 15 MG tablet Take 15 mg by mouth at bedtime.       . ondansetron (ZOFRAN ODT) 4 MG disintegrating tablet Take 1 tablet (4 mg total) by mouth every 6 (six) hours as needed for nausea.  30 tablet  0  . pantoprazole (PROTONIX) 40 MG tablet TAKE (1) TABLET BY MOUTH TWICE DAILY.  60 tablet  5  . propranolol (INDERAL) 10 MG tablet Take 0.5 tablets (5 mg total) by mouth 2 (two) times daily.  45 tablet  3  . rifaximin (XIFAXAN) 200 MG tablet Take 200 mg by mouth 3 (three) times daily. When samples are available      . simvastatin (  ZOCOR) 40 MG tablet Take 40 mg by mouth at bedtime.       . traZODone (DESYREL) 50 MG tablet Take 50 mg by mouth at bedtime.       . betamethasone dipropionate (DIPROLENE) 0.05 % cream       . furosemide (LASIX) 40 MG tablet Take 40 mg by mouth daily.        No current facility-administered medications for this visit.    Allergies as of 08/08/2012 - Review Complete 07/28/2012  Allergen Reaction Noted  . Codeine Other (See Comments)   . Tricode ar (pseudoeph-chlorphen-codeine)  05/27/2012  . Other Rash 05/05/2010    Family History  Problem Relation Age of Onset  . Colon cancer Neg Hx   . Inflammatory bowel disease Neg Hx   . Liver disease Neg Hx   . Anesthesia problems Neg Hx   . Hypotension Neg Hx   . Malignant hyperthermia Neg Hx   . Pseudochol deficiency Neg Hx     History   Social History  . Marital Status: Divorced    Spouse Name: N/A    Number of Children: 1  . Years of Education: N/A   Occupational History  . retired     disabled   Social History Main Topics  . Smoking status: Never Smoker   . Smokeless tobacco: Never Used  . Alcohol Use: No  . Drug Use: No  . Sexually Active: Yes    Birth Control/ Protection: None   Other Topics Concern  . None   Social History Narrative   The patient is divorced. The patient had one son.   Patient denies ever  having smoked or used smokeless tobacco products. Patient denies current use of alcohol.      Mother and father are both deceased.    Review of Systems: Negative unless mentioned in HPI.   Physical Exam: BP 120/68  Pulse 84  Temp(Src) 98.2 F (36.8 C) (Oral)  Ht 6\' 1"  (1.854 m)  Wt 210 lb 6.4 oz (95.437 kg)  BMI 27.76 kg/m2 General:   Alert and oriented. No distress noted. Pleasant and cooperative.  Head:  Normocephalic and atraumatic. Eyes:  Conjuctiva clear without scleral icterus. Mouth:  Oral mucosa pink and moist. Good dentition. No lesions. Neck:  Supple, without mass or thyromegaly. Heart:  S1, S2 present  Abdomen:  +BS, soft, non-tender and non-distended. No significant HSM. Extremities:  Without edema. Neurologic:  Very mild asterixis  [place] of abdomen May 2014 at [facility] :  1. Patent TIPS 2. Patent portal and hepatic vasculature with bidirectional right and left portal vein flows. The middle hepatic vein could not be well-visualized. 3. Coarse echotexture of the liver which is suggestive of chronic liver disease 4. Unchanged splenomegaly  Lab Results  Component Value Date   WBC 5.7 08/07/2012   HGB 10.5* 08/07/2012   HCT 31.7* 08/07/2012   MCV 94.3 08/07/2012   PLT 104* 08/07/2012   Lab Results  Component Value Date   CREATININE 2.02* 08/07/2012   BUN 28* 08/07/2012   NA 140 08/07/2012   K 4.5 08/07/2012   CL 107 08/07/2012   CO2 27 08/07/2012

## 2012-08-08 NOTE — Patient Instructions (Addendum)
Do not take Lasix until after we review your blood work on Monday. Then, we will call and let you know what dosage to take.  Please have blood work done on Monday.  Continue lactulose, titrating this to 3 soft bowel movements a day.   We will see you in 6 weeks!

## 2012-08-09 ENCOUNTER — Encounter (HOSPITAL_COMMUNITY): Payer: Medicare Other | Attending: Oncology | Admitting: Oncology

## 2012-08-09 VITALS — BP 139/79 | HR 86 | Temp 98.0°F | Resp 16 | Wt 210.0 lb

## 2012-08-09 DIAGNOSIS — D509 Iron deficiency anemia, unspecified: Secondary | ICD-10-CM | POA: Insufficient documentation

## 2012-08-09 DIAGNOSIS — K7460 Unspecified cirrhosis of liver: Secondary | ICD-10-CM | POA: Insufficient documentation

## 2012-08-09 DIAGNOSIS — D649 Anemia, unspecified: Secondary | ICD-10-CM

## 2012-08-09 DIAGNOSIS — D638 Anemia in other chronic diseases classified elsewhere: Secondary | ICD-10-CM

## 2012-08-09 DIAGNOSIS — D696 Thrombocytopenia, unspecified: Secondary | ICD-10-CM | POA: Insufficient documentation

## 2012-08-09 LAB — IRON AND TIBC
Saturation Ratios: 23 % (ref 20–55)
TIBC: 213 ug/dL — ABNORMAL LOW (ref 215–435)
UIBC: 163 ug/dL (ref 125–400)

## 2012-08-09 NOTE — Addendum Note (Signed)
Addended byMarica Otter J on: 08/09/2012 11:03 AM   Modules accepted: Orders

## 2012-08-09 NOTE — Progress Notes (Signed)
Willie Ruths, MD [address] [place] [address]  Anemia - Plan: CBC with Differential  Iron deficiency anemia - Plan: Iron and TIBC, Ferritin  Thrombocytopenia - Plan: CBC with Differential  Cirrhosis of liver without mention of alcohol  CURRENT THERAPY: Observation with support of lab results via Feraheme IV infusions.  INTERVAL HISTORY: [name] 67 y.o. male returns for  regular  visit for followup of Multifactorial anemia which is very stable. He has both cirrhosis of the liver, GI blood losses, this resulted in iron deficiency, and he has an anemia of chronic disease picture as well. As long as we replace his iron he keeps his hemoglobin in the 11-12 g range typically.  I personally reviewed and went over laboratory results with the patient.  His labs are stable from a hematologic standpoint.  His Hgb is down a little, but still within his usual range.  Most recently is 10.5 g/dL.  His WBC is WNL and Platelet count is very stable at 104,000.  Recently, he was admitted at a tertiary hospital for encephalopathy secondary to his liver disease/fall.  Subsequently, he was admitted to [facility] facility where he underwent physical therapy.  He appreciated the exercises, but was glad to go back home which is where he is now.  He has followed-up locally with GI and will defer medical management of his liver issues to them.    From a hematologic standpoint, he is stable.  I will check iron studies today.  Hematologically, he denies any complaints and ROS questioning is negative.   He admits that he feels as though his right ear is "stopped up."  He denies any fevers or chills.  He denies any ear pain, but does admit to occasional jaw pain.  He is not on any bisphosphonate therapy according to his chart.   Past Medical History  Diagnosis Date  . Hypertension     negative stress nuclear study in 02/2007; normal coronaries in 2006;  Echo in 2006-aortic sclerosis, Mild biatrial enlargement; normal LV  . Pulmonary hypertension     Mild pulmonary hypertension with normal PVR by 2010 cath [facility])  . Hyperlipidemia   . Syncope 2008    orthostatic hypotension  . Diabetes mellitus     insulin; onset in 1999; retinopathy, neuropathy and nephropathy  . Chronic atrial fibrillation     onset in 1995; anticoagulation discontinued in 2006  . Crohn's disease     -lower GI bleed in 08/2002;  Pentasa stopped by [name] at [facility] ; clinical remission.  . Obesity   . NASH (nonalcoholic steatohepatitis)     cirrhosis with esophageal varices; h/o GI bleeding. EGD-2010 grade 2 esophageal varices-banded with good hemostasis; portal gastropathy, friable gastric mucosa; hiatal hernia.  EGD- recurrent melena one week later showed 2 eschars at previously placed bands, decompressed esophageal varices grade 1-2,four columns, no bleeding stigmata, normal appearing small bowel, pt has had 1 Hep A/B vaccine.    . Esophageal varices     s/p band ligation multiple times, Portal Hypertensive gastropathy;  . S/P TIPS (transjugular intrahepatic portosystemic shunt)     06/2009-TIPS procedure at [facility]  . GI bleed     07/2009-admitted with recurrent upper GI bleed secondary to esophageal varices-banded x3  . Pleural effusion, left     s/p thoracentesis-multiple  . Depression   . Anemia     Iron def requiring parenteral iron or injections in past,  followed by Dr. Glenford [name]; B12 def  . Duodenal ulcer 1980s  . Ascites   . Hiatal hernia 12/02/2009  . Esophageal erosions     consistent with mild erosive reflux esophagitis  . Portal hypertensive gastropathy   . Melena 04/21/2011    GAVE; s/p APC ablation per [name]  . Cancer 10/2011  . Squamous cell carcinoma of face   . Pneumonia     long time ago  . Shortness of breath   . Chronic kidney disease     stage III; creatinine of 1.6 in 8/07, 1.35 and 05/2008  . Nephrolithiasis     cystoscopy  with stone extraction in 1990,   . GERD (gastroesophageal reflux disease)   . Arthritis     knees & ankles   . S/P radiation therapy 01/07/12 thru 03/21/2012    [name]; Right cheek/parotid and neck nodes    has HYPERLIPIDEMIA; UMBILICAL HERNIA; Cirrhosis of liver without mention of alcohol; Hypertension; Pulmonary hypertension; Diabetes mellitus; Chronic atrial fibrillation; Chronic kidney disease; NASH (nonalcoholic steatohepatitis); Esophageal varices; S/P TIPS (transjugular intrahepatic portosystemic shunt); Anemia; Epigastric pain; Heme positive stool; Edema of left lower extremity; Iron deficiency anemia; B12 deficiency anemia; Upper GI bleed; Melena; Blood loss anemia; Thrombocytopenia; Chronic diarrhea; Abdominal pain; Chronic periodontitis; Carotid bruit; and Diabetes on his problem list.     is allergic to codeine; tricode ar; and other.  [name] does not currently have medications on file.  Past Surgical History  Procedure Laterality Date  . Tips procedure  09/16/09  . 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 Cholecystectomy    . Circumcision  -1992  . Colonoscopy  -2008    Reportedly normal, done in [place]  . Multiple thoracenteses    . Esophagogastroduodenoscopy  12/02/2009    distal esophageal erosions, small hiatal hernia  . Tips procedure  2011  . Colonoscopy  01/26/2011    RMR: adequate preparation.  normal rectum. Somewhat  diffusely friable colonic mucosa. A single 4 mm polyp in the ascending segment. couple of mucosal breaks in the distal 10 cm of terminal ileum with  a couple of erosions at the junction  the TI  and colon.. Tubular adenoma, chronic ileitis. Next TCS 01/2016.  Marland Kitchen Esophagogastroduodenoscopy  01/26/2011    RMR: Decompressed/abscent esophagel varices s/p TIPS. small hh. GAVE. Procedure: ESOPHAGOGASTRODUODENOSCOPY (EGD);  Surgeon: Corbin Ade, MD;  Location: [facility];  Service:  Endoscopy;  Laterality: N/A;  . Esophagogastroduodenoscopy  04/22/2011    RMR: GAVE.-status post APC ablation as described above  . Esophagogastroduodenoscopy  05/20/2011    RMR: Portal gastropathy.  GAVE - status post APC ablation  . Hot hemostasis  05/20/2011    Procedure: HOT HEMOSTASIS (ARGON PLASMA COAGULATION/BICAP);  Surgeon: Corbin Ade, MD;  Location: [facility];  Service: Endoscopy;  Laterality: N/A;  . Esophagogastroduodenoscopy  07/14/2005    Persisting two columns of grade 2 esophageal varices with one cherry red spot on one of these columns distally.  Otherwise normal  . Skin cancer excision  Oct 2013    large lesion rt side of face  . Cardiac catheterization       04/12/2008 [facility]): no significant CAD, mild pulmonary hypertension with normal PVR, normal pulmonary capillary wedge pressure, normal left ventricular end-diastolic pressure.  . Multiple extractions with alveoloplasty  01/14/2012    Procedure: MULTIPLE EXTRACION WITH ALVEOLOPLASTY;  Surgeon: Charlynne Pander,  DDS;  Location: [facility] OR;  Service: Oral Surgery;  Laterality: N/A;  Extraction of tooth #'s [phone] with alveoloplasty and gross debridement of remaining teeth    Denies any headaches, dizziness, double vision, fevers, chills, night sweats, nausea, vomiting, diarrhea, constipation, chest pain, heart palpitations, shortness of breath, blood in stool, black tarry stool, urinary pain, urinary burning, urinary frequency, hematuria.   PHYSICAL EXAMINATION  ECOG PERFORMANCE STATUS: 2 - Symptomatic, <50% confined to bed  Filed Vitals:   08/09/12 1020  BP: 139/79  Pulse: 86  Temp: 98 F (36.7 C)  Resp: 16    GENERAL:alert, no distress, cooperative, smiling and chronically-ill appearing SKIN: skin color, texture, turgor are normal, no rashes or significant lesions HEAD: Normocephalic, No masses, lesions, tenderness or abnormalities EYES: normal, PERRLA, EOMI, Conjunctiva are pink and non-injected EARS: R  TM normal, shiny and non-erythematous and minimal cerumen, L TM  normal and shiny and non-erythematous OROPHARYNX:mucous membranes are moist  NECK: supple, trachea midline LYMPH:  no palpable lymphadenopathy BREAST:not examined LUNGS: clear to auscultation and percussion, decreased breath sounds secondary to poor effort HEART: regular rate & rhythm, no murmurs, no gallops, S1 normal and S2 normal ABDOMEN:obese and normal bowel sounds BACK: Back symmetric, no curvature. EXTREMITIES:less then 2 second capillary refill, no skin discoloration, no cyanosis  NEURO: alert & oriented x 3 with fluent speech, no focal motor/sensory deficits, gait normal with rolling walker for stability   LABORATORY DATA: CBC    Component Value Date/Time   WBC 5.7 08/07/2012 0905   RBC 3.36* 08/07/2012 0905   RBC 3.40* 12/14/2010 1045   HGB 10.5* 08/07/2012 0905   HCT 31.7* 08/07/2012 0905   PLT 104* 08/07/2012 0905   MCV 94.3 08/07/2012 0905   MCH 31.3 08/07/2012 0905   MCHC 33.1 08/07/2012 0905   RDW 14.2 08/07/2012 0905   LYMPHSABS 0.6* 08/07/2012 0905   MONOABS 0.6 08/07/2012 0905   EOSABS 0.1 08/07/2012 0905   BASOSABS 0.0 08/07/2012 0905      Chemistry      Component Value Date/Time   NA 140 08/07/2012 0905   K 4.5 08/07/2012 0905   CL 107 08/07/2012 0905   CO2 27 08/07/2012 0905   BUN 28* 08/07/2012 0905   CREATININE 2.02* 08/07/2012 0905   CREATININE 1.66* 02/28/2012 1303      Component Value Date/Time   CALCIUM 8.6 08/07/2012 0905   ALKPHOS 112 02/28/2012 1303   AST 22 02/28/2012 1303   ALT 10 02/28/2012 1303   BILITOT 1.3* 02/28/2012 1303        ASSESSMENT:  1. Multifactorial anemia which is very stable. He has both cirrhosis of the liver, GI blood losses, this resulted in iron deficiency, and he has an anemia of chronic disease picture as well. As long as we replace his iron he keeps his hemoglobin in the 11-12 g range typically. 2. NASH, s/p TIPS with recent US May 2014 at [facility] noting patency and negative for  [facility].   Patient Active Problem List   Diagnosis Date Noted  . Diabetes 06/07/2012  . Carotid bruit 01/19/2012  . Chronic periodontitis 01/14/2012  . Abdominal pain 12/06/2011  . Chronic diarrhea 05/27/2011  . Melena 04/22/2011  . Blood loss anemia 04/22/2011  . Thrombocytopenia 04/22/2011  . Upper GI bleed 04/21/2011  . Edema of left lower extremity 04/13/2011  . Iron deficiency anemia 04/13/2011  . B12 deficiency anemia 04/13/2011    Class: History of  . Heme positive stool 01/11/2011  .  Epigastric pain 06/29/2010  . Hypertension   . Pulmonary hypertension   . Diabetes mellitus   . Chronic atrial fibrillation   . Chronic kidney disease   . NASH (nonalcoholic steatohepatitis)   . Esophageal varices   . S/P TIPS (transjugular intrahepatic portosystemic shunt)   . Anemia   . UMBILICAL HERNIA 05/26/2009  . Cirrhosis of liver without mention of alcohol 06/04/2008  . HYPERLIPIDEMIA 02/22/2007     PLAN:  1. I personally reviewed and went over laboratory results with the patient. 2. Will perform Iron studies today: Iron/TIBC, Ferritin 3. Will support labs as necessary. 4. Will provide Feraheme IV per lab results 5. Recommended continued follow-up with GI and PCP as directed. 6. Labs every 8 weeks: CBC diff, Iron/TIBC, Ferritin 7. Negative Otic examination 8. Return in 4 months for follow-up   THERAPY PLAN:  Going forward, we will perform labs every 8 weeks and see him back in 4 months  For follow-up.  We will support his labs via Feraheme and if needed, we can develop a supportive therapy plan with [facility] (but this has not been needed).  All questions were answered. The patient knows to call the clinic with any problems, questions or concerns. We can certainly see the patient much sooner if necessary.  Patient and plan discussed with Dr. Gerarda Fraction and he is in agreement with the aforementioned.  KEFALAS,THOMAS

## 2012-08-09 NOTE — Patient Instructions (Signed)
[facility] Discharge Instructions  RECOMMENDATIONS MADE BY THE CONSULTANT AND ANY TEST RESULTS WILL BE SENT TO YOUR REFERRING PHYSICIAN.  EXAM FINDINGS BY THE PHYSICIAN TODAY AND SIGNS OR SYMPTOMS TO REPORT TO CLINIC OR PRIMARY PHYSICIAN: Exam findings as discussed by T. Kefalas, PA-C.  SPECIAL INSTRUCTIONS/FOLLOW-UP: 1.  Return every 2 months for labs. 2.  Please keep your office visit as scheduled in 4 months.  Thank you for choosing [facility] to provide your oncology and hematology care.  To afford each patient quality time with our providers, please arrive at least 15 minutes before your scheduled appointment time.  With your help, our goal is to use those 15 minutes to complete the necessary work-up to ensure our physicians have the information they need to help with your evaluation and healthcare recommendations.    Effective January 1st, 2014, we ask that you re-schedule your appointment with our physicians should you arrive 10 or more minutes late for your appointment.  We strive to give you quality time with our providers, and arriving late affects you and other patients whose appointments are after yours.    Again, thank you for choosing [facility].  Our hope is that these requests will decrease the amount of time that you wait before being seen by our physicians.       _____________________________________________________________  Should you have questions after your visit to [facility], please contact our office at [phone] between the hours of 8:30 a.m. and 5:00 p.m.  Voicemails left after 4:30 p.m. will not be returned until the following business day.  For prescription refill requests, have your pharmacy contact our office with your prescription refill request.

## 2012-08-10 NOTE — Progress Notes (Signed)
Cc PCP 

## 2012-08-15 LAB — BASIC METABOLIC PANEL
BUN: 22 mg/dL (ref 6–23)
Creat: 1.91 mg/dL — ABNORMAL HIGH (ref 0.50–1.35)
Glucose, Bld: 248 mg/dL — ABNORMAL HIGH (ref 70–99)
Potassium: 4.7 mEq/L (ref 3.5–5.3)

## 2012-08-17 ENCOUNTER — Other Ambulatory Visit: Payer: Self-pay | Admitting: Urgent Care

## 2012-08-23 ENCOUNTER — Other Ambulatory Visit: Payer: Self-pay

## 2012-08-23 ENCOUNTER — Other Ambulatory Visit: Payer: Self-pay | Admitting: Gastroenterology

## 2012-08-23 DIAGNOSIS — K7460 Unspecified cirrhosis of liver: Secondary | ICD-10-CM

## 2012-08-23 MED ORDER — FUROSEMIDE 20 MG PO TABS: 10.0000 mg | ORAL_TABLET | Freq: Every day | ORAL | Status: DC

## 2012-08-23 NOTE — Progress Notes (Signed)
Quick Note:  Restart Lasix at 10 mg po daily.  I know that's a tiny dose, but he has not tolerated higher doses well.  Recheck in 1 week. ______

## 2012-08-28 LAB — BASIC METABOLIC PANEL
Potassium: 4.9 mEq/L (ref 3.5–5.3)
Sodium: 140 mEq/L (ref 135–145)

## 2012-09-08 NOTE — Progress Notes (Signed)
Quick Note:  Will recheck at next appt. Low dose lasix.  ______

## 2012-09-19 ENCOUNTER — Ambulatory Visit (INDEPENDENT_AMBULATORY_CARE_PROVIDER_SITE_OTHER): Payer: Medicare Other | Admitting: Gastroenterology

## 2012-09-19 ENCOUNTER — Emergency Department (HOSPITAL_COMMUNITY): Payer: Medicare Other

## 2012-09-19 ENCOUNTER — Encounter (HOSPITAL_COMMUNITY): Payer: Self-pay

## 2012-09-19 ENCOUNTER — Inpatient Hospital Stay (HOSPITAL_COMMUNITY)
Admission: EM | Admit: 2012-09-19 | Discharge: 2012-09-22 | DRG: 312 | Disposition: A | Discharge status: Home Health | Payer: Medicare Other | Attending: Internal Medicine | Admitting: Internal Medicine

## 2012-09-19 VITALS — BP 85/45 | HR 89 | Temp 98.2°F | Ht 70.0 in | Wt 209.8 lb

## 2012-09-19 DIAGNOSIS — E119 Type 2 diabetes mellitus without complications: Secondary | ICD-10-CM

## 2012-09-19 DIAGNOSIS — Z95828 Presence of other vascular implants and grafts: Secondary | ICD-10-CM

## 2012-09-19 DIAGNOSIS — K7460 Unspecified cirrhosis of liver: Secondary | ICD-10-CM

## 2012-09-19 DIAGNOSIS — I959 Hypotension, unspecified: Secondary | ICD-10-CM

## 2012-09-19 DIAGNOSIS — Z794 Long term (current) use of insulin: Secondary | ICD-10-CM

## 2012-09-19 DIAGNOSIS — R531 Weakness: Secondary | ICD-10-CM

## 2012-09-19 DIAGNOSIS — I129 Hypertensive chronic kidney disease with stage 1 through stage 4 chronic kidney disease, or unspecified chronic kidney disease: Secondary | ICD-10-CM | POA: Diagnosis present

## 2012-09-19 DIAGNOSIS — K921 Melena: Secondary | ICD-10-CM

## 2012-09-19 DIAGNOSIS — I8500 Esophageal varices without bleeding: Secondary | ICD-10-CM

## 2012-09-19 DIAGNOSIS — R600 Localized edema: Secondary | ICD-10-CM

## 2012-09-19 DIAGNOSIS — D649 Anemia, unspecified: Secondary | ICD-10-CM

## 2012-09-19 DIAGNOSIS — I4891 Unspecified atrial fibrillation: Secondary | ICD-10-CM | POA: Diagnosis present

## 2012-09-19 DIAGNOSIS — F329 Major depressive disorder, single episode, unspecified: Secondary | ICD-10-CM | POA: Diagnosis present

## 2012-09-19 DIAGNOSIS — K766 Portal hypertension: Secondary | ICD-10-CM | POA: Diagnosis present

## 2012-09-19 DIAGNOSIS — D689 Coagulation defect, unspecified: Secondary | ICD-10-CM

## 2012-09-19 DIAGNOSIS — K2100 Gastro-esophageal reflux disease with esophagitis, without bleeding: Secondary | ICD-10-CM | POA: Diagnosis present

## 2012-09-19 DIAGNOSIS — N183 Chronic kidney disease, stage 3 unspecified: Secondary | ICD-10-CM | POA: Diagnosis present

## 2012-09-19 DIAGNOSIS — N058 Unspecified nephritic syndrome with other morphologic changes: Secondary | ICD-10-CM | POA: Diagnosis present

## 2012-09-19 DIAGNOSIS — K529 Noninfective gastroenteritis and colitis, unspecified: Secondary | ICD-10-CM

## 2012-09-19 DIAGNOSIS — E1149 Type 2 diabetes mellitus with other diabetic neurological complication: Secondary | ICD-10-CM | POA: Diagnosis present

## 2012-09-19 DIAGNOSIS — F3289 Other specified depressive episodes: Secondary | ICD-10-CM | POA: Diagnosis present

## 2012-09-19 DIAGNOSIS — Z79899 Other long term (current) drug therapy: Secondary | ICD-10-CM

## 2012-09-19 DIAGNOSIS — D509 Iron deficiency anemia, unspecified: Secondary | ICD-10-CM

## 2012-09-19 DIAGNOSIS — E785 Hyperlipidemia, unspecified: Secondary | ICD-10-CM

## 2012-09-19 DIAGNOSIS — N189 Chronic kidney disease, unspecified: Secondary | ICD-10-CM

## 2012-09-19 DIAGNOSIS — K7581 Nonalcoholic steatohepatitis (NASH): Secondary | ICD-10-CM

## 2012-09-19 DIAGNOSIS — K922 Gastrointestinal hemorrhage, unspecified: Secondary | ICD-10-CM

## 2012-09-19 DIAGNOSIS — K7689 Other specified diseases of liver: Secondary | ICD-10-CM | POA: Diagnosis present

## 2012-09-19 DIAGNOSIS — K429 Umbilical hernia without obstruction or gangrene: Secondary | ICD-10-CM

## 2012-09-19 DIAGNOSIS — Z9089 Acquired absence of other organs: Secondary | ICD-10-CM

## 2012-09-19 DIAGNOSIS — N289 Disorder of kidney and ureter, unspecified: Secondary | ICD-10-CM

## 2012-09-19 DIAGNOSIS — I2789 Other specified pulmonary heart diseases: Secondary | ICD-10-CM | POA: Diagnosis present

## 2012-09-19 DIAGNOSIS — N179 Acute kidney failure, unspecified: Secondary | ICD-10-CM

## 2012-09-19 DIAGNOSIS — E861 Hypovolemia: Secondary | ICD-10-CM

## 2012-09-19 DIAGNOSIS — E1142 Type 2 diabetes mellitus with diabetic polyneuropathy: Secondary | ICD-10-CM | POA: Diagnosis present

## 2012-09-19 DIAGNOSIS — K319 Disease of stomach and duodenum, unspecified: Secondary | ICD-10-CM | POA: Diagnosis present

## 2012-09-19 DIAGNOSIS — E11319 Type 2 diabetes mellitus with unspecified diabetic retinopathy without macular edema: Secondary | ICD-10-CM | POA: Diagnosis present

## 2012-09-19 DIAGNOSIS — I951 Orthostatic hypotension: Principal | ICD-10-CM

## 2012-09-19 DIAGNOSIS — Z6827 Body mass index (BMI) 27.0-27.9, adult: Secondary | ICD-10-CM

## 2012-09-19 DIAGNOSIS — I10 Essential (primary) hypertension: Secondary | ICD-10-CM

## 2012-09-19 DIAGNOSIS — Z923 Personal history of irradiation: Secondary | ICD-10-CM

## 2012-09-19 DIAGNOSIS — K0530 Chronic periodontitis, unspecified: Secondary | ICD-10-CM

## 2012-09-19 DIAGNOSIS — I272 Pulmonary hypertension, unspecified: Secondary | ICD-10-CM

## 2012-09-19 DIAGNOSIS — T502X5A Adverse effect of carbonic-anhydrase inhibitors, benzothiadiazides and other diuretics, initial encounter: Secondary | ICD-10-CM | POA: Diagnosis present

## 2012-09-19 DIAGNOSIS — R195 Other fecal abnormalities: Secondary | ICD-10-CM

## 2012-09-19 DIAGNOSIS — T465X5A Adverse effect of other antihypertensive drugs, initial encounter: Secondary | ICD-10-CM | POA: Diagnosis present

## 2012-09-19 DIAGNOSIS — D696 Thrombocytopenia, unspecified: Secondary | ICD-10-CM

## 2012-09-19 DIAGNOSIS — E1129 Type 2 diabetes mellitus with other diabetic kidney complication: Secondary | ICD-10-CM | POA: Diagnosis present

## 2012-09-19 DIAGNOSIS — R0989 Other specified symptoms and signs involving the circulatory and respiratory systems: Secondary | ICD-10-CM

## 2012-09-19 DIAGNOSIS — D500 Iron deficiency anemia secondary to blood loss (chronic): Secondary | ICD-10-CM

## 2012-09-19 DIAGNOSIS — E860 Dehydration: Secondary | ICD-10-CM

## 2012-09-19 DIAGNOSIS — I482 Chronic atrial fibrillation, unspecified: Secondary | ICD-10-CM

## 2012-09-19 DIAGNOSIS — R109 Unspecified abdominal pain: Secondary | ICD-10-CM

## 2012-09-19 DIAGNOSIS — R1013 Epigastric pain: Secondary | ICD-10-CM

## 2012-09-19 DIAGNOSIS — E1139 Type 2 diabetes mellitus with other diabetic ophthalmic complication: Secondary | ICD-10-CM | POA: Diagnosis present

## 2012-09-19 DIAGNOSIS — D519 Vitamin B12 deficiency anemia, unspecified: Secondary | ICD-10-CM

## 2012-09-19 DIAGNOSIS — M129 Arthropathy, unspecified: Secondary | ICD-10-CM | POA: Diagnosis present

## 2012-09-19 HISTORY — DX: Coagulation defect, unspecified: D68.9

## 2012-09-19 HISTORY — DX: Morbid (severe) obesity due to excess calories: E66.01

## 2012-09-19 LAB — URINALYSIS W MICROSCOPIC + REFLEX CULTURE
Bilirubin Urine: NEGATIVE
Ketones, ur: NEGATIVE mg/dL
Leukocytes, UA: NEGATIVE
Nitrite: NEGATIVE
Specific Gravity, Urine: >1.03 — ABNORMAL HIGH (ref 1.005–1.030)
Urobilinogen, UA: 1 mg/dL (ref 0.0–1.0)

## 2012-09-19 LAB — CBC WITH DIFFERENTIAL/PLATELET
Basophils Absolute: 0 10*3/uL (ref 0.0–0.1)
Eosinophils Absolute: 0 10*3/uL (ref 0.0–0.7)
Eosinophils Relative: 1 % (ref 0–5)
HCT: 30 % — ABNORMAL LOW (ref 39.0–52.0)
Lymphocytes Relative: 5 % — ABNORMAL LOW (ref 12–46)
MCH: 31.1 pg (ref 26.0–34.0)
MCHC: 33.7 g/dL (ref 30.0–36.0)
MCV: 92.3 fL (ref 78.0–100.0)
Monocytes Absolute: 0.7 10*3/uL (ref 0.1–1.0)
Platelets: 94 10*3/uL — ABNORMAL LOW (ref 150–400)
RDW: 14.3 % (ref 11.5–15.5)
Smear Review: DECREASED
WBC: 7.9 10*3/uL (ref 4.0–10.5)

## 2012-09-19 LAB — LACTIC ACID, PLASMA: Lactic Acid, Venous: 4 mmol/L — ABNORMAL HIGH (ref 0.5–2.2)

## 2012-09-19 LAB — TROPONIN I: Troponin I: <0.3 ng/mL (ref <0.30)

## 2012-09-19 LAB — COMPREHENSIVE METABOLIC PANEL
Albumin: 2.9 g/dL — ABNORMAL LOW (ref 3.5–5.2)
BUN: 25 mg/dL — ABNORMAL HIGH (ref 6–23)
Calcium: 9.2 mg/dL (ref 8.4–10.5)
Chloride: 105 mEq/L (ref 96–112)
Creatinine, Ser: 2.24 mg/dL — ABNORMAL HIGH (ref 0.50–1.35)
GFR calc non Af Amer: 29 mL/min — ABNORMAL LOW (ref >90)
Total Bilirubin: 1.3 mg/dL — ABNORMAL HIGH (ref 0.3–1.2)

## 2012-09-19 LAB — GLUCOSE, CAPILLARY: Glucose-Capillary: 197 mg/dL — ABNORMAL HIGH (ref 70–99)

## 2012-09-19 LAB — AMMONIA: Ammonia: 136 umol/L — ABNORMAL HIGH (ref 11–60)

## 2012-09-19 MED ORDER — GABAPENTIN 300 MG PO CAPS
300.0000 mg | ORAL_CAPSULE | Freq: Every day | ORAL | Status: DC
  Administered 2012-09-19 – 2012-09-21 (×3): 300 mg via ORAL
  Filled 2012-09-19 (×3): qty 1

## 2012-09-19 MED ORDER — SODIUM CHLORIDE 0.9 % IV SOLN: INTRAVENOUS | Status: AC

## 2012-09-19 MED ORDER — ONDANSETRON HCL 4 MG/2ML IJ SOLN
4.0000 mg | Freq: Four times a day (QID) | INTRAMUSCULAR | Status: DC | PRN
  Administered 2012-09-21: 4 mg via INTRAVENOUS
  Filled 2012-09-19: qty 2

## 2012-09-19 MED ORDER — INSULIN ASPART 100 UNIT/ML ~~LOC~~ SOLN
0.0000 [IU] | Freq: Three times a day (TID) | SUBCUTANEOUS | Status: DC
  Administered 2012-09-20 (×2): 3 [IU] via SUBCUTANEOUS
  Administered 2012-09-21 (×3): 2 [IU] via SUBCUTANEOUS
  Administered 2012-09-22: 3 [IU] via SUBCUTANEOUS

## 2012-09-19 MED ORDER — PANTOPRAZOLE SODIUM 40 MG PO TBEC
40.0000 mg | DELAYED_RELEASE_TABLET | Freq: Every day | ORAL | Status: DC
  Administered 2012-09-20 – 2012-09-22 (×3): 40 mg via ORAL
  Filled 2012-09-19 (×3): qty 1

## 2012-09-19 MED ORDER — SODIUM CHLORIDE 0.9 % IV SOLN
INTRAVENOUS | Status: DC
  Administered 2012-09-19 – 2012-09-21 (×2): via INTRAVENOUS

## 2012-09-19 MED ORDER — DIGOXIN 125 MCG PO TABS
0.1250 mg | ORAL_TABLET | Freq: Every day | ORAL | Status: DC
  Administered 2012-09-20 – 2012-09-22 (×3): 0.125 mg via ORAL
  Filled 2012-09-19 (×3): qty 1

## 2012-09-19 MED ORDER — INSULIN GLARGINE 100 UNIT/ML ~~LOC~~ SOLN
50.0000 [IU] | Freq: Every day | SUBCUTANEOUS | Status: DC
  Administered 2012-09-19 – 2012-09-21 (×3): 50 [IU] via SUBCUTANEOUS
  Filled 2012-09-19 (×6): qty 0.5

## 2012-09-19 MED ORDER — LACTULOSE 10 GM/15ML PO SOLN
10.0000 g | Freq: Four times a day (QID) | ORAL | Status: DC
  Administered 2012-09-19 – 2012-09-21 (×7): 10 g via ORAL
  Filled 2012-09-19 (×7): qty 30

## 2012-09-19 MED ORDER — SODIUM CHLORIDE 0.9 % IV SOLN
INTRAVENOUS | Status: DC
  Administered 2012-09-19 – 2012-09-20 (×3): via INTRAVENOUS

## 2012-09-19 MED ORDER — MIRTAZAPINE 30 MG PO TABS
15.0000 mg | ORAL_TABLET | Freq: Every day | ORAL | Status: DC
  Administered 2012-09-19 – 2012-09-21 (×3): 15 mg via ORAL
  Filled 2012-09-19 (×3): qty 1

## 2012-09-19 MED ORDER — SODIUM CHLORIDE 0.9 % IJ SOLN
3.0000 mL | Freq: Two times a day (BID) | INTRAMUSCULAR | Status: DC
  Administered 2012-09-20 – 2012-09-22 (×2): 3 mL via INTRAVENOUS

## 2012-09-19 MED ORDER — SIMVASTATIN 20 MG PO TABS
40.0000 mg | ORAL_TABLET | Freq: Every day | ORAL | Status: DC
  Administered 2012-09-19 – 2012-09-21 (×3): 40 mg via ORAL
  Filled 2012-09-19 (×3): qty 2

## 2012-09-19 MED ORDER — ACETAMINOPHEN 650 MG RE SUPP: 650.0000 mg | Freq: Four times a day (QID) | RECTAL | Status: DC | PRN

## 2012-09-19 MED ORDER — FLUTICASONE PROPIONATE 50 MCG/ACT NA SUSP
1.0000 | Freq: Every day | NASAL | Status: DC
  Administered 2012-09-20 – 2012-09-22 (×3): 1 via NASAL
  Filled 2012-09-19 (×2): qty 16

## 2012-09-19 MED ORDER — TRAZODONE HCL 50 MG PO TABS
50.0000 mg | ORAL_TABLET | Freq: Every day | ORAL | Status: DC
  Administered 2012-09-19 – 2012-09-21 (×3): 50 mg via ORAL
  Filled 2012-09-19 (×3): qty 1

## 2012-09-19 MED ORDER — RIFAXIMIN 550 MG PO TABS
550.0000 mg | ORAL_TABLET | Freq: Two times a day (BID) | ORAL | Status: DC
  Administered 2012-09-19 – 2012-09-22 (×6): 550 mg via ORAL
  Filled 2012-09-19 (×14): qty 1

## 2012-09-19 MED ORDER — ALPRAZOLAM 0.5 MG PO TABS: 0.5000 mg | ORAL_TABLET | Freq: Two times a day (BID) | ORAL | Status: DC | PRN

## 2012-09-19 MED ORDER — INSULIN GLARGINE 100 UNIT/ML ~~LOC~~ SOLN
SUBCUTANEOUS | Status: AC
  Filled 2012-09-19: qty 10

## 2012-09-19 MED ORDER — PROPRANOLOL HCL 10 MG PO TABS
5.0000 mg | ORAL_TABLET | Freq: Two times a day (BID) | ORAL | Status: DC
  Administered 2012-09-19 – 2012-09-21 (×5): 5 mg via ORAL
  Filled 2012-09-19 (×12): qty 0.5

## 2012-09-19 MED ORDER — ACETAMINOPHEN 325 MG PO TABS
650.0000 mg | ORAL_TABLET | Freq: Four times a day (QID) | ORAL | Status: DC | PRN
  Administered 2012-09-21: 650 mg via ORAL
  Filled 2012-09-19: qty 2

## 2012-09-19 MED ORDER — ONDANSETRON HCL 4 MG PO TABS: 4.0000 mg | ORAL_TABLET | Freq: Four times a day (QID) | ORAL | Status: DC | PRN

## 2012-09-19 MED ORDER — TETANUS-DIPHTH-ACELL PERTUSSIS 5-2.5-18.5 LF-MCG/0.5 IM SUSP
0.5000 mL | Freq: Once | INTRAMUSCULAR | Status: AC
  Administered 2012-09-19: 0.5 mL via INTRAMUSCULAR
  Filled 2012-09-19: qty 0.5

## 2012-09-19 NOTE — H&P (Signed)
[facility] History and Physical  [name] JYN:[id number] DOB: May 08, 1945 DOA: 09/19/2012  Referring physician: ED PCP: Kirk Ruths, MD   Chief Complaint:  Weakness for possible 3 -4 days  HPI:  67 year old male with history of hypertension, hyperlipidemia, diabetes mellitus, chronic A. 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 He also slumped on the ground this morning with weakness and lacerated his knees. In the clinic he was noted to be hypotensive with systolic blood pressure in the 80s and sent to the ED pediatrician denies any headache, dizziness, lightheadedness, chest pain, shortness of breath, palpitations, nausea, vomiting, fever, chills, abdominal pain, bowel or urinary symptoms. Denies any hematemesis or melena. In the ED patient was noted to be significantly orthostatic and started on IV fluids . blood work done  showed baseline hemoglobin and mildly worsened renal function.  Hospitalists called for admission under observation.  Review of Systems:  Constitutional: Denies fever, chills, diaphoresis, reports poor appetite and fatigue.  HEENT: Denies photophobia, eye pain, redness, hearing loss, ear pain, congestion, sore throat, rhinorrhea, sneezing, mouth sores, trouble swallowing, neck pain, neck stiffness and tinnitus.   Respiratory: Denies SOB, DOE, cough, chest tightness,  and wheezing.   Cardiovascular: Denies chest pain, palpitations and leg swelling.  Gastrointestinal: Denies nausea, vomiting, abdominal pain, diarrhea, constipation, blood in stool and abdominal distention.  Genitourinary: Denies dysuria, urgency, frequency, hematuria, flank pain and  difficulty urinating.  Musculoskeletal: Generalized weakness , Denies myalgias, back pain, joint swelling, arthralgias and gait problem.  Skin: Denies pallor, rash and wound.  Neurological: Denies dizziness, seizures, syncope, weakness, light-headedness, numbness and headaches.  Psychiatric/Behavioral: Denies mood changes, confusion, nervousness, sleep disturbance and agitation   Past Medical History  Diagnosis Date  . Hypertension     negative stress nuclear study in 02/2007; normal coronaries in 2006; Echo in 2006-aortic sclerosis, Mild biatrial enlargement; normal LV  . Pulmonary hypertension     Mild pulmonary hypertension with normal PVR by 2010 cath [facility])  . Hyperlipidemia   . Syncope 2008    orthostatic hypotension  . Diabetes mellitus     insulin; onset in 1999; retinopathy, neuropathy and nephropathy  . Chronic atrial fibrillation     onset in 1995; anticoagulation discontinued in 2006  . Crohn's disease     -lower GI bleed in 08/2002;  Pentasa stopped by Dr. Brooke Dare at [facility] ; clinical remission.  . Obesity   . NASH (nonalcoholic steatohepatitis)     cirrhosis with esophageal varices; h/o GI bleeding. EGD-2010 grade 2 esophageal varices-banded with good hemostasis; portal gastropathy, friable gastric mucosa; hiatal hernia.  EGD- recurrent melena one week later showed 2 eschars at previously placed bands, decompressed esophageal varices grade 1-2,four columns, no bleeding stigmata, normal appearing small bowel, pt has had 1 Hep A/B vaccine.    . Esophageal varices     s/p band ligation multiple times, Portal Hypertensive gastropathy;  . S/P TIPS (transjugular intrahepatic portosystemic shunt)     06/2009-[facility]IPS procedure at [facility]  . GI bleed     07/2009-admitted with recurrent upper GI bleed secondary to esophageal varices-banded x3  . Pleural effusion, left     s/p thoracentesis-multiple  . Depression   . Anemia     Iron def requiring parenteral iron  or injections in past,  followed by Dr. Glenford Peers; B12 def  . Duodenal ulcer 1980s  . Ascites   . Hiatal hernia 12/02/2009  . Esophageal erosions     consistent with mild erosive reflux esophagitis  . Portal hypertensive gastropathy   . 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 Jena Gauss  . Cancer 10/2011  . Squamous cell carcinoma of face   . Pneumonia     long time ago  . Shortness of breath   . GERD (gastroesophageal reflux disease)   . Arthritis     knees & ankles   . S/P radiation therapy 01/07/12 thru 03/21/2012    Dr. Thersa Salt; Right cheek/parotid and neck nodes  . Chronic kidney disease     stage III; creatinine of 1.6 in 8/07, 1.35 and 05/2008  . Nephrolithiasis     cystoscopy with stone extraction in 1990,    Past Surgical History  Procedure Laterality Date  . Tips procedure  09/16/09  . 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 Cholecystectomy    . Circumcision  -1992  . Colonoscopy  -2008    Reportedly normal, done in [place]  . Multiple thoracenteses    . Esophagogastroduodenoscopy  12/02/2009    distal esophageal erosions, small hiatal hernia  . Tips procedure  2011  . Colonoscopy  01/26/2011    RMR: adequate preparation.  normal rectum. Somewhat  diffusely friable colonic mucosa. A single 4 mm polyp in the ascending segment. couple of mucosal breaks in the distal 10 cm of terminal ileum with  a couple of erosions at the junction  the TI  and colon.. Tubular adenoma, chronic ileitis. Next TCS 01/2016.  Marland Kitchen Esophagogastroduodenoscopy  01/26/2011    RMR: Decompressed/abscent esophagel varices s/p TIPS. small hh. GAVE. Procedure: ESOPHAGOGASTRODUODENOSCOPY (EGD);  Surgeon: Corbin Ade, MD;  Location: [facility];  Service: Endoscopy;  Laterality: N/A;  . Esophagogastroduodenoscopy  04/22/2011    RMR: GAVE.-status post APC ablation as described above  . Esophagogastroduodenoscopy  05/20/2011    RMR: Portal gastropathy.   GAVE - status post APC ablation  . Hot hemostasis  05/20/2011    Procedure: HOT HEMOSTASIS (ARGON PLASMA COAGULATION/BICAP);  Surgeon: Corbin Ade, MD;  Location: [facility];  Service: Endoscopy;  Laterality: N/A;  . Esophagogastroduodenoscopy  07/14/2005    Persisting two columns of grade 2 esophageal varices with one cherry red spot on one of these columns distally.  Otherwise normal  . Skin cancer excision  Oct 2013    large lesion rt side of face  . Cardiac catheterization       04/12/2008 [facility]): no significant CAD, mild pulmonary hypertension with normal PVR, normal pulmonary capillary wedge pressure, normal left ventricular end-diastolic pressure.  . Multiple extractions with alveoloplasty  01/14/2012    Procedure: MULTIPLE EXTRACION WITH ALVEOLOPLASTY;  Surgeon: Charlynne Pander, DDS;  Location: [facility] OR;  Service: Oral Surgery;  Laterality: N/A;  Extraction of tooth #'s [phone] with alveoloplasty and gross debridement of remaining teeth   Social History:  reports that he has never smoked. He has never used smokeless tobacco. He reports that he does not drink alcohol or use illicit drugs.  Allergies  Allergen Reactions  . Codeine Other (See Comments)    Can't function - legs won't move,etc.  . Tricode Ar [Pseudoeph-Chlorphen-Codeine]   . Other Rash    Adhesive tape  Family History  Problem Relation Age of Onset  . Colon cancer Neg Hx   . Inflammatory bowel disease Neg Hx   . Liver disease Neg Hx   . Anesthesia problems Neg Hx   . Hypotension Neg Hx   . Malignant hyperthermia Neg Hx   . Pseudochol deficiency Neg Hx     Prior to Admission medications   Medication Sig Start Date End Date Taking? Authorizing Provider  ALPRAZolam Prudy Feeler) 0.5 MG tablet Take 0.5 mg by mouth 2 (two) times daily as needed.  02/08/11  Yes Historical Provider, MD  DIGOX 0.125 MG tablet TAKE ONE TABLET BY MOUTH DAILY. 03/27/12  Yes Jonelle Sidle, MD  furosemide (LASIX) 20 MG tablet Take  0.5 tablets (10 mg total) by mouth daily. 08/23/12  Yes Nira Retort, NP  gabapentin (NEURONTIN) 300 MG capsule Take 300 mg by mouth at bedtime.  05/29/10  Yes Historical Provider, MD  insulin aspart (NOVOLOG) 100 UNIT/ML injection Inject 0-20 Units into the skin 2 (two) times daily. Patient is on Sliding Scale   Yes Historical Provider, MD  insulin glargine (LANTUS) 100 UNIT/ML injection Inject 60-75 Units into the skin nightly. 05/14/11  Yes Historical Provider, MD  lactulose (CHRONULAC) 10 GM/15ML solution Take 10 g by mouth 4 times daily. Take only if you run out of Xifaxan 11/20/10  Yes Historical Provider, MD  lisinopril (PRINIVIL,ZESTRIL) 5 MG tablet Take 5 mg by mouth daily.  04/07/12  Yes Historical Provider, MD  mirtazapine (REMERON) 15 MG tablet 15 mg. Take 15 mg by mouth nightly.   Yes Historical Provider, MD  mometasone (NASONEX) 50 MCG/ACT nasal spray Place 2 sprays into the nose 2 (two) times daily.   Yes Historical Provider, MD  ondansetron (ZOFRAN) 4 MG tablet 4 mg. Take 4 mg by mouth every 8 (eight) hours as needed for Nausea.   Yes Historical Provider, MD  pantoprazole (PROTONIX) 40 MG tablet TAKE (1) TABLET BY MOUTH TWICE DAILY. 08/17/12  Yes Tiffany Kocher, PA-C  propranolol (INDERAL) 10 MG tablet Take 0.5 tablets (5 mg total) by mouth 2 (two) times daily. 01/19/12  Yes Dyann Kief, PA-C  rifaximin (XIFAXAN) 200 MG tablet Take 550 mg by mouth 2 (two) times daily.    Yes Historical Provider, MD  simvastatin (ZOCOR) 40 MG tablet 40 mg. Take 40 mg by mouth nightly. 12/09/10  Yes Historical Provider, MD  traZODone (DESYREL) 50 MG tablet 50 mg. Take 50 mg by mouth nightly.   Yes Historical Provider, MD    Physical Exam:  Filed Vitals:   09/19/12 1226 09/19/12 1230 09/19/12 1459 09/19/12 1717  BP: 100/64 143/64 164/72 162/76  Pulse: 98 86 83 82  Temp:    97.8 F (36.6 C)  TempSrc:    Oral  Resp:  16 18   Height:    6\' 1"  (1.854 m)  Weight:    95.165 kg (209 lb 12.8 oz)  SpO2:   100% 100% 100%    Constitutional: Vital signs reviewed.  Elderly male lying in bed in no acute distress HEENT: Pallor present, no icterus, dry oral mucosa Chest: Clear to auscultation bilaterally, no added sounds CVS: S1 and S2 is regular, no murmurs rub or gallop Abdomen: Protruded umbilicus, nondistended, nontender, bowel sounds present, no organomegaly Extremities: Warm, no edema CNS: AAO x3   Labs on Admission:  Basic Metabolic Panel:  Recent Labs Lab 09/19/12 1219  NA 138  K 4.4  CL 105  CO2 22  GLUCOSE  288*  BUN 25*  CREATININE 2.24*  CALCIUM 9.2   Liver Function Tests:  Recent Labs Lab 09/19/12 1219  AST 17  ALT 8  ALKPHOS 109  BILITOT 1.3*  PROT 7.5  ALBUMIN 2.9*   No results found for this basename: LIPASE, AMYLASE,  in the last 168 hours  Recent Labs Lab 09/19/12 1219  AMMONIA 136*   CBC:  Recent Labs Lab 09/19/12 1219  WBC 7.9  NEUTROABS 6.8  HGB 10.1*  HCT 30.0*  MCV 92.3  PLT 94*   Cardiac Enzymes:  Recent Labs Lab 09/19/12 1219  TROPONINI <0.30   BNP: No components found with this basename: POCBNP,  CBG: No results found for this basename: GLUCAP,  in the last 168 hours  Radiological Exams on Admission: Dg Chest 2 View  09/19/2012   CLINICAL DATA:  Weakness. Dehydration. Hypotensive.  EXAM: CHEST  2 VIEW  COMPARISON:  02/28/2012 and 12/01/2009  FINDINGS: Left basilar pleural thickening or fluid appears stable. No evidence of acute infiltrate or edema. Right lung is clear. Heart size is stable. Thoracolumbar spine posterior fixation rods are again seen in place.  IMPRESSION: Chronic left pleural fluid or thickening.  No acute findings.   Electronically Signed   By: Myles Rosenthal   On: 09/19/2012 13:06    EKG: Afib @91 , no ST-T changes  Assessment/Plan Principal Problem:   Orthostatic hypotension In the setting of dehydration. Patient appears initially dry. Will hold all blood pressure medications and start him on IV hydration  with normal saline. Repeat orthostasis in the morning. -He has had orthostatic hypotension in the past. -PT eval  Active Problems:   Cirrhosis of liver without mention of alcohol -patient has NASH cirrhosis with TIPS done for refractory ascites. Also has Crohn's disease in remission. Patient following with Dr. Kendell Bane. Continue with rifaximin,  propanolol and lactulose       Diabetes mellitus Continue Lantus 50 units at bedside along with sliding scale insulin    Chronic atrial fibrillation Rate controlled. On metoprolol and digoxin. Would hold metoprolol for now given hypotension    Chronic kidney disease mildly worsened secondary to dehydration. Monitor with IV fluids    Esophageal varices Currently stable. Continue propanolol    Anemia Hemoglobin at baseline. Monitor in a.m.   DVT prophylaxis: SCDs Diet: Diabetic  Code Status: Full code Family Communication: Daughter-in-law at bedside Disposition Plan: Home once stable  Eddie North [facility] Pager [phone]  If 7PM-7AM, please contact night-coverage [email or web address] Password [facility] 09/19/2012, 5:58 PM    Total time spent on admission: 55 minutes

## 2012-09-19 NOTE — Plan of Care (Signed)
Problem: Consults Goal: Skin Care Protocol Initiated - if Braden Score 18 or less If consults are not indicated, leave blank or document N/A Outcome: Progressing Abrasions to bilateral knees

## 2012-09-19 NOTE — ED Notes (Signed)
Pt reports he has been weak since last night with worsening weakness this morning causing PT to be unable to get up off the floor this morning. Pt was taken to see PCP this morning where it was discovered his BP was low, per pt. Pt reports unable to ambulate at home with extremity weakness.   Denies n/v/D and pain at this time.

## 2012-09-19 NOTE — ED Notes (Signed)
Pt reports had scheduled appt with Dr. Jena Gauss this am.   Pt reports woke up and legs felt weak.  Reports was in the bathroom and fell down on his knees due to weakness.  Pt went to Dr. Luvenia Starch office and was told his bp low.  Pt says his knees are bruised from the fall.

## 2012-09-19 NOTE — ED Provider Notes (Signed)
CSN: [id number]     Arrival date & time 09/19/12  1124 History   First MD Initiated Contact with Patient 09/19/12 1210     Chief Complaint  Patient presents with  . Hypotension  . Extremity Weakness    HPI Pt was seen at 1235. Per pt and his family, c/o gradual onset and worsening of persistent generalized weakness for the past several days, worse today. Pt states he lowered himself to his bilat knees this morning due to feeling generally weak and feeling unable to stand. Pt was evaluated by his GI MD PTA, then sent to the ED for further evaluation. GI MD office states pt was hypotensive with SBP 70-80's. Denies CP/palpitations, no SOB/cough, no abd pain, no N/V/D, no fevers. Denies falling and hitting head, no neck or back pain.    Past Medical History  Diagnosis Date  . Hypertension     negative stress nuclear study in 02/2007; normal coronaries in 2006; Echo in 2006-aortic sclerosis, Mild biatrial enlargement; normal LV  . Pulmonary hypertension     Mild pulmonary hypertension with normal PVR by 2010 cath [facility])  . Hyperlipidemia   . Syncope 2008    orthostatic hypotension  . Diabetes mellitus     insulin; onset in 1999; retinopathy, neuropathy and nephropathy  . Chronic atrial fibrillation     onset in 1995; anticoagulation discontinued in 2006  . Crohn's disease     -lower GI bleed in 08/2002;  Pentasa stopped by Dr. Brooke Dare at [facility] ; clinical remission.  . Obesity   . NASH (nonalcoholic steatohepatitis)     cirrhosis with esophageal varices; h/o GI bleeding. EGD-2010 grade 2 esophageal varices-banded with good hemostasis; portal gastropathy, friable gastric mucosa; hiatal hernia.  EGD- recurrent melena one week later showed 2 eschars at previously placed bands, decompressed esophageal varices grade 1-2,four columns, no bleeding stigmata, normal appearing small bowel, pt has had 1 Hep A/B vaccine.    . Esophageal varices     s/p band ligation multiple times, Portal Hypertensive  gastropathy;  . S/P TIPS (transjugular intrahepatic portosystemic shunt)     06/2009-TIPS procedure at [facility]  . GI bleed     07/2009-admitted with recurrent upper GI bleed secondary to esophageal varices-banded x3  . Pleural effusion, left     s/p thoracentesis-multiple  . Depression   . Anemia     Iron def requiring parenteral iron or injections in past, followed by Dr. Glenford Peers; B12 def  . Duodenal ulcer 1980s  . Ascites   . Hiatal hernia 12/02/2009  . Esophageal erosions     consistent with mild erosive reflux esophagitis  . Portal hypertensive gastropathy   . 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 Jena Gauss  . Cancer 10/2011  . Squamous cell carcinoma of face   . Pneumonia     long time ago  . Shortness of breath   . Chronic kidney disease     stage III; creatinine of 1.6 in 8/07, 1.35 and 05/2008  . Nephrolithiasis     cystoscopy with stone extraction in 1990,   . GERD (gastroesophageal reflux disease)   . Arthritis     knees & ankles   . S/P radiation therapy 01/07/12 thru 03/21/2012    Dr. Thersa Salt; Right cheek/parotid and neck nodes   Past Surgical History  Procedure Laterality Date  . Tips procedure  09/16/09  . Thoracic spine surgery  2008    Following a fall(T10 fracture subluzation w/ DISH  s/p fusion of T7-L1)  . Cholecystectomy    . Circumcision  -1992  . Colonoscopy  -2008    Reportedly normal, done in [place]  . Multiple thoracenteses    . Esophagogastroduodenoscopy  12/02/2009    distal esophageal erosions, small hiatal hernia  . Tips procedure  2011  . Colonoscopy  01/26/2011    RMR: adequate preparation.  normal rectum. Somewhat  diffusely friable colonic mucosa. A single 4 mm polyp in the ascending segment. couple of mucosal breaks in the distal 10 cm of terminal ileum with  a couple of erosions at the junction  the TI  and colon.. Tubular adenoma, chronic ileitis. Next TCS 01/2016.  Marland Kitchen Esophagogastroduodenoscopy  01/26/2011    RMR:  Decompressed/abscent esophagel varices s/p TIPS. small hh. GAVE. Procedure: ESOPHAGOGASTRODUODENOSCOPY (EGD);  Surgeon: Corbin Ade, MD;  [facility];  Service: Endoscopy;  Laterality: N/A;  . Esophagogastroduodenoscopy  04/22/2011    RMR: GAVE.-status post APC ablation as described above  . Esophagogastroduodenoscopy  05/20/2011    RMR: Portal gastropathy.  GAVE - status post APC ablation  . Hot hemostasis  05/20/2011    Procedure: HOT HEMOSTASIS (ARGON PLASMA COAGULATION/BICAP);  Surgeon: Corbin Ade, MD;  Location: [facility];  Service: Endoscopy;  Laterality: N/A;  . Esophagogastroduodenoscopy  07/14/2005    Persisting two columns of grade 2 esophageal varices with one cherry red spot on one of these columns distally.  Otherwise normal  . Skin cancer excision  Oct 2013    large lesion rt side of face  . Cardiac catheterization       04/12/2008 [facility]): no significant CAD, mild pulmonary hypertension with normal PVR, normal pulmonary capillary wedge pressure, normal left ventricular end-diastolic pressure.  . Multiple extractions with alveoloplasty  01/14/2012    Procedure: MULTIPLE EXTRACION WITH ALVEOLOPLASTY;  Surgeon: Charlynne Pander, DDS;  Location: [facility] OR;  Service: Oral Surgery;  Laterality: N/A;  Extraction of tooth #'s [phone] with alveoloplasty and gross debridement of remaining teeth   Family History  Problem Relation Age of Onset  . Colon cancer Neg Hx   . Inflammatory bowel disease Neg Hx   . Liver disease Neg Hx   . Anesthesia problems Neg Hx   . Hypotension Neg Hx   . Malignant hyperthermia Neg Hx   . Pseudochol deficiency Neg Hx    History  Substance Use Topics  . Smoking status: Never Smoker   . Smokeless tobacco: Never Used  . Alcohol Use: No    Review of Systems ROS: Statement: All systems negative except as marked or noted in the HPI; Constitutional: Negative for fever and chills. +generalized weakness/fatigue.; ; Eyes: Negative for eye  pain, redness and discharge. ; ; ENMT: Negative for ear pain, hoarseness, nasal congestion, sinus pressure and sore throat. ; ; Cardiovascular: Negative for chest pain, palpitations, diaphoresis, dyspnea and peripheral edema. ; ; Respiratory: Negative for cough, wheezing and stridor. ; ; Gastrointestinal: Negative for nausea, vomiting, diarrhea, abdominal pain, blood in stool, hematemesis, jaundice and rectal bleeding. . ; ; Genitourinary: Negative for dysuria, flank pain and hematuria. ; ; Musculoskeletal: Negative for back pain and neck pain. Negative for swelling and trauma.; ; Skin: +abrasions, ecchymosis. Negative for pruritus, rash, blisters and skin lesion.; ; Neuro: Negative for headache, lightheadedness and neck stiffness. Negative for altered level of consciousness , altered mental status, extremity weakness, paresthesias, involuntary movement, seizure and syncope.     Allergies  Codeine; Tricode ar; and Other  Home Medications  Current Outpatient Rx  Name  Route  Sig  Dispense  Refill  . ALPRAZolam (XANAX) 0.5 MG tablet   Oral   Take 0.5 mg by mouth 2 (two) times daily as needed.          Marland Kitchen DIGOX 0.125 MG tablet      TAKE ONE TABLET BY MOUTH DAILY.   30 tablet   6   . furosemide (LASIX) 20 MG tablet   Oral   Take 0.5 tablets (10 mg total) by mouth daily.   30 tablet   0   . gabapentin (NEURONTIN) 300 MG capsule   Oral   Take 300 mg by mouth at bedtime.          . insulin aspart (NOVOLOG) 100 UNIT/ML injection   Subcutaneous   Inject 0-20 Units into the skin 2 (two) times daily. Patient is on Sliding Scale         . insulin glargine (LANTUS) 100 UNIT/ML injection      Inject 60-75 Units into the skin nightly.         . lactulose (CHRONULAC) 10 GM/15ML solution      Take 10 g by mouth 4 times daily. Take only if you run out of Xifaxan         . lisinopril (PRINIVIL,ZESTRIL) 5 MG tablet   Oral   Take 5 mg by mouth daily.          . mirtazapine  (REMERON) 15 MG tablet      15 mg. Take 15 mg by mouth nightly.         . mometasone (NASONEX) 50 MCG/ACT nasal spray   Nasal   Place 2 sprays into the nose 2 (two) times daily.         . ondansetron (ZOFRAN) 4 MG tablet      4 mg. Take 4 mg by mouth every 8 (eight) hours as needed for Nausea.         . pantoprazole (PROTONIX) 40 MG tablet      TAKE (1) TABLET BY MOUTH TWICE DAILY.   60 tablet   5   . propranolol (INDERAL) 10 MG tablet   Oral   Take 0.5 tablets (5 mg total) by mouth 2 (two) times daily.   45 tablet   3   . rifaximin (XIFAXAN) 200 MG tablet   Oral   Take 550 mg by mouth 2 (two) times daily.          . simvastatin (ZOCOR) 40 MG tablet      40 mg. Take 40 mg by mouth nightly.         . traZODone (DESYREL) 50 MG tablet      50 mg. Take 50 mg by mouth nightly.          BP 143/64  Pulse 86  Temp(Src) 98.2 F (36.8 C) (Oral)  Resp 16  Ht 6\' 1"  (1.854 m)  Wt 208 lb (94.348 kg)  BMI 27.45 kg/m2  SpO2 100% Physical Exam 1240: Physical examination:  Nursing notes reviewed; Vital signs and O2 SAT reviewed;  Constitutional: Well developed, Well nourished, In no acute distress; Head:  Normocephalic, atraumatic; Eyes: EOMI, PERRL, No scleral icterus; ENMT: Mouth and pharynx normal, Mucous membranes dry; Neck: Supple, Full range of motion, No lymphadenopathy; Cardiovascular: Irregular irregular rate and rhythm, No gallop; Respiratory: Breath sounds clear & equal bilaterally, No rales, rhonchi, wheezes.  Speaking full sentences with ease, Normal respiratory effort/excursion; Chest: Nontender, Movement  normal; Abdomen: Soft, Nontender, Nondistended, Normal bowel sounds; Genitourinary: No CVA tenderness; Extremities: +superifical abrasions inferior to bilat knees. Bilat knees with faint ecchymosis. +FROM bilat knees, including able to lift extended bilat LE off stretcher, and extend bilat lower legs against resistance.  No ligamentous laxity.  No patellar or  quad tendon step-offs.  NMS intact bilat feet, strong pedal pp. +plantarflexion of right and left feet w/calf squeeze.  No palpable gap right and left Achilles's tendons.  No proximal fibular head tenderness bilat.  No edema, erythema, warmth, or deformity.  No specific area of point tenderness. Pulses normal, No tenderness, No edema, No calf edema or asymmetry.; Neuro: AA&Ox3, Major CN grossly intact. No facial droop. Speech clear. No gross focal motor or sensory deficits in extremities.; Skin: Color normal, Warm, Dry.   ED Course  Procedures     MDM  MDM Reviewed: previous chart, nursing note and vitals Reviewed previous: labs and ECG Interpretation: labs, ECG and x-ray     Date: 09/19/2012  Rate: 91  Rhythm: atrial fibrillation and premature ventricular contractions (PVC)  QRS Axis: normal  Intervals: normal  ST/T Wave abnormalities: nonspecific T wave changes  Conduction Disutrbances:none  Narrative Interpretation:   Old EKG Reviewed: unchanged; no significant changes from previous EKG dated 02/28/2012.  Results for orders placed during the hospital encounter of 09/19/12  CBC WITH DIFFERENTIAL      Result Value Range   WBC 7.9  4.0 - 10.5 K/uL   RBC 3.25 (*) 4.22 - 5.81 MIL/uL   Hemoglobin 10.1 (*) 13.0 - 17.0 g/dL   HCT 57.8 (*) 46.9 - 62.9 %   MCV 92.3  78.0 - 100.0 fL   MCH 31.1  26.0 - 34.0 pg   MCHC 33.7  30.0 - 36.0 g/dL   RDW 52.8  41.3 - 24.4 %   Platelets 94 (*) 150 - 400 K/uL   Neutrophils Relative % 86 (*) 43 - 77 %   Neutro Abs 6.8  1.7 - 7.7 K/uL   Lymphocytes Relative 5 (*) 12 - 46 %   Lymphs Abs 0.4 (*) 0.7 - 4.0 K/uL   Monocytes Relative 9  3 - 12 %   Monocytes Absolute 0.7  0.1 - 1.0 K/uL   Eosinophils Relative 1  0 - 5 %   Eosinophils Absolute 0.0  0.0 - 0.7 K/uL   Basophils Relative 0  0 - 1 %   Basophils Absolute 0.0  0.0 - 0.1 K/uL   Smear Review PLATELETS APPEAR DECREASED    COMPREHENSIVE METABOLIC PANEL      Result Value Range   Sodium 138   135 - 145 mEq/L   Potassium 4.4  3.5 - 5.1 mEq/L   Chloride 105  96 - 112 mEq/L   CO2 22  19 - 32 mEq/L   Glucose, Bld 288 (*) 70 - 99 mg/dL   BUN 25 (*) 6 - 23 mg/dL   Creatinine, Ser 0.10 (*) 0.50 - 1.35 mg/dL   Calcium 9.2  8.4 - 27.2 mg/dL   Total Protein 7.5  6.0 - 8.3 g/dL   Albumin 2.9 (*) 3.5 - 5.2 g/dL   AST 17  0 - 37 U/L   ALT 8  0 - 53 U/L   Alkaline Phosphatase 109  39 - 117 U/L   Total Bilirubin 1.3 (*) 0.3 - 1.2 mg/dL   GFR calc non Af Amer 29 (*) >90 mL/min   GFR calc Af Amer 33 (*) >90 mL/min  AMMONIA      Result Value Range   Ammonia 136 (*) 11 - 60 umol/L  TROPONIN I      Result Value Range   Troponin I <0.30  <0.30 ng/mL  LACTIC ACID, PLASMA      Result Value Range   Lactic Acid, Venous 4.0 (*) 0.5 - 2.2 mmol/L   Dg Chest 2 View 09/19/2012   CLINICAL DATA:  Weakness. Dehydration. Hypotensive.  EXAM: CHEST  2 VIEW  COMPARISON:  02/28/2012 and 12/01/2009  FINDINGS: Left basilar pleural thickening or fluid appears stable. No evidence of acute infiltrate or edema. Right lung is clear. Heart size is stable. Thoracolumbar spine posterior fixation rods are again seen in place.  IMPRESSION: Chronic left pleural fluid or thickening.  No acute findings.   Electronically Signed   By: Myles Rosenthal   On: 09/19/2012 13:06    Results for AEDAN, GEIMER (MRN [id number]) as of 09/19/2012 14:11  Ref. Range 02/14/2012 11:22 02/28/2012 13:03 04/11/2012 13:37 08/07/2012 09:05 09/19/2012 12:19  Hemoglobin Latest Range: 13.0-17.0 g/dL 40.9 (L) 81.1 (L) 91.4 (L) 10.5 (L) 10.1 (L)  HCT Latest Range: 39.0-52.0 % 33.4 (L) 33.7 (L) 34.2 (L) 31.7 (L) 30.0 (L)   Results for HARROLD, FITCHETT (MRN [id number]) as of 09/19/2012 14:11  Ref. Range 08/07/2012 09:05 08/14/2012 15:05 08/28/2012 11:08 09/19/2012 12:19  BUN Latest Range: 6-23 mg/dL 28 (H) 22 21 25  (H)  Creatinine Latest Range: 0.50-1.35 mg/dL 0.86 (H) 5.78 (H) 4.69 (H) 2.24 (H)   Results for AUGUSTO, DECKMAN (MRN [id number]) as of 09/19/2012 14:11   Ref. Range 07/14/2009 02:08 04/23/2011 04:41 02/28/2012 13:54 08/07/2012 09:05 09/19/2012 12:19  Ammonia Latest Range: 11-60 umol/L 67 (H) 80 (H) 25 76 (H) 136 (H)     1400:  H/H per baseline. BUN/Cr and ammonia elevated from previous. Lactic acid elevated, but pt with known liver disease.  Orthostatic on VS, SG on Udip high, and pt appears clinically dehydrated; will dose judicious IVF. Dx and testing d/w pt and family.  Questions answered.  Verb understanding, agreeable to admit.  T/C to [facility] Dr. Gonzella Lex, case discussed, including:  HPI, pertinent PM/SHx, VS/PE, dx testing, ED course and treatment:  Agreeable to admit, requests to write temporary orders, obtain tele bed to team 2.    Laray Anger, DO 09/20/12 [phone]

## 2012-09-19 NOTE — Patient Instructions (Addendum)
Go straight to the emergency room for evaluation so they can see exactly what is going on.   We will make further recommendations after that.   We will also attempt to get the Xifaxan approved through your insurance company.

## 2012-09-19 NOTE — Progress Notes (Signed)
Referring Provider: Karleen Hampshire, MD Primary Care Physician:  Kirk Ruths, MD Primary GI: Dr. Jena Gauss   Chief Complaint  Patient presents with  . Follow-up    HPI:   67 year old male presents today in routine follow-up with a history of NASH cirrhosis, s/p TIPS in the past for refractory ascites. Crohn's disease in remission. Xifaxan denied by [employer], and he obtains samples as able. Main complaint today is of weakness. Significantly hypotensive.  .  Had trouble getting in the bed last night, had trouble standing up this morning. No abdominal pain, +knee pain. Eased himself down this morning onto knees "didn't fall". No melena or hematochezia. Urinated X 4 yesterday. Not drinking a whole lot. Urine clear, yellow. Feels like mouth is dry. No N/V. Feels like heart is beating irregular. Mild dizziness. Denies significant changes in vision. No chest pain. SOB with moving "awhole lot". Weight is good. Denies abdominal distension. Lasix 10 mg daily. 2 bowel movements per day.   Past Medical History  Diagnosis Date  . Hypertension     negative stress nuclear study in 02/2007; normal coronaries in 2006; Echo in 2006-aortic sclerosis, Mild biatrial enlargement; normal LV  . Pulmonary hypertension     Mild pulmonary hypertension with normal PVR by 2010 cath [facility])  . Hyperlipidemia   . Syncope 2008    orthostatic hypotension  . Diabetes mellitus     insulin; onset in 1999; retinopathy, neuropathy and nephropathy  . Chronic atrial fibrillation     onset in 1995; anticoagulation discontinued in 2006  . Crohn's disease     -lower GI bleed in 08/2002;  Pentasa stopped by Dr. Brooke Dare at [facility] ; clinical remission.  . Obesity   . NASH (nonalcoholic steatohepatitis)     cirrhosis with esophageal varices; h/o GI bleeding. EGD-2010 grade 2 esophageal varices-banded with good hemostasis; portal gastropathy, friable gastric mucosa; hiatal hernia.  EGD- recurrent melena one week later  showed 2 eschars at previously placed bands, decompressed esophageal varices grade 1-2,four columns, no bleeding stigmata, normal appearing small bowel, pt has had 1 Hep A/B vaccine.    . Esophageal varices     s/p band ligation multiple times, Portal Hypertensive gastropathy;  . S/P TIPS (transjugular intrahepatic portosystemic shunt)     06/2009-TIPS procedure at [facility]  . GI bleed     07/2009-admitted with recurrent upper GI bleed secondary to esophageal varices-banded x3  . Pleural effusion, left     s/p thoracentesis-multiple  . Depression   . Anemia     Iron def requiring parenteral iron or injections in past, followed by Dr. Glenford Peers; B12 def  . Duodenal ulcer 1980s  . Ascites   . Hiatal hernia 12/02/2009  . Esophageal erosions     consistent with mild erosive reflux esophagitis  . Portal hypertensive gastropathy   . 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 Jena Gauss  . Cancer 10/2011  . Squamous cell carcinoma of face   . Pneumonia     long time ago  . Shortness of breath   . Chronic kidney disease     stage III; creatinine of 1.6 in 8/07, 1.35 and 05/2008  . Nephrolithiasis     cystoscopy with stone extraction in 1990,   . GERD (gastroesophageal reflux disease)   . Arthritis     knees & ankles   . S/P radiation therapy 01/07/12 thru 03/21/2012    Dr. Thersa Salt; Right cheek/parotid and neck nodes    Past Surgical  History  Procedure Laterality Date  . Tips procedure  09/16/09  . 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 Cholecystectomy    . Circumcision  -1992  . Colonoscopy  -2008    Reportedly normal, done in [place]  . Multiple thoracenteses    . Esophagogastroduodenoscopy  12/02/2009    distal esophageal erosions, small hiatal hernia  . Tips procedure  2011  . Colonoscopy  01/26/2011    RMR: adequate preparation.  normal rectum. Somewhat  diffusely friable colonic mucosa. A single 4 mm polyp  in the ascending segment. couple of mucosal breaks in the distal 10 cm of terminal ileum with  a couple of erosions at the junction  the TI  and colon.. Tubular adenoma, chronic ileitis. Next TCS 01/2016.  Marland Kitchen Esophagogastroduodenoscopy  01/26/2011    RMR: Decompressed/abscent esophagel varices s/p TIPS. small hh. GAVE. Procedure: ESOPHAGOGASTRODUODENOSCOPY (EGD);  Surgeon: Corbin Ade, MD;  [facility];  Service: Endoscopy;  Laterality: N/A;  . Esophagogastroduodenoscopy  04/22/2011    RMR: GAVE.-status post APC ablation as described above  . Esophagogastroduodenoscopy  05/20/2011    RMR: Portal gastropathy.  GAVE - status post APC ablation  . Hot hemostasis  05/20/2011    Procedure: HOT HEMOSTASIS (ARGON PLASMA COAGULATION/BICAP);  Surgeon: Corbin Ade, MD;  [facility];  Service: Endoscopy;  Laterality: N/A;  . Esophagogastroduodenoscopy  07/14/2005    Persisting two columns of grade 2 esophageal varices with one cherry red spot on one of these columns distally.  Otherwise normal  . Skin cancer excision  Oct 2013    large lesion rt side of face  . Cardiac catheterization       04/12/2008 [facility]): no significant CAD, mild pulmonary hypertension with normal PVR, normal pulmonary capillary wedge pressure, normal left ventricular end-diastolic pressure.  . Multiple extractions with alveoloplasty  01/14/2012    Procedure: MULTIPLE EXTRACION WITH ALVEOLOPLASTY;  Surgeon: Charlynne Pander, DDS;  Location: [facility] OR;  Service: Oral Surgery;  Laterality: N/A;  Extraction of tooth #'s [phone] with alveoloplasty and gross debridement of remaining teeth    Current Outpatient Prescriptions  Medication Sig Dispense Refill  . ALPRAZolam (XANAX) 0.5 MG tablet Take 0.5 mg by mouth 2 (two) times daily as needed.       . betamethasone dipropionate (DIPROLENE) 0.05 % cream       . DIGOX 0.125 MG tablet TAKE ONE TABLET BY MOUTH DAILY.  30 tablet  6  . diphenhydrAMINE (Q-DRYL) 12.5 MG/5ML  liquid as needed.      . furosemide (LASIX) 20 MG tablet Take 0.5 tablets (10 mg total) by mouth daily.  30 tablet  0  . furosemide (LASIX) 40 MG tablet Take 20 mg by mouth daily.       Marland Kitchen gabapentin (NEURONTIN) 300 MG capsule Take 300 mg by mouth at bedtime.       . insulin aspart (NOVOLOG) 100 UNIT/ML injection Inject 0-20 Units into the skin 2 (two) times daily. Patient is on Sliding Scale      . insulin aspart (NOVOLOG) 100 UNIT/ML injection Inject into the skin. 5 units with breakfast, 5 units with lunch, and 15 units with dinner.      . insulin glargine (LANTUS) 100 UNIT/ML injection Inject 60-75 Units into the skin nightly.      . lactulose (CHRONULAC) 10 GM/15ML solution Take 10 g by mouth 4 times daily. Take  only if you run out of Xifaxan      . lisinopril (PRINIVIL,ZESTRIL) 5 MG tablet Take 5 mg by mouth daily.       . mirtazapine (REMERON) 15 MG tablet 15 mg. Take 15 mg by mouth nightly.      . ondansetron (ZOFRAN) 4 MG tablet 4 mg. Take 4 mg by mouth every 8 (eight) hours as needed for Nausea.      . pantoprazole (PROTONIX) 40 MG tablet TAKE (1) TABLET BY MOUTH TWICE DAILY.  60 tablet  5  . propranolol (INDERAL) 10 MG tablet Take 0.5 tablets (5 mg total) by mouth 2 (two) times daily.  45 tablet  3  . propranolol (INDERAL) 10 MG tablet 5 mg. Take 5 mg by mouth 2 times daily.      . rifaximin (XIFAXAN) 200 MG tablet Take 550 mg by mouth 2 (two) times daily. When samples are available      . simvastatin (ZOCOR) 40 MG tablet 40 mg. Take 40 mg by mouth nightly.      . traZODone (DESYREL) 50 MG tablet 50 mg. Take 50 mg by mouth nightly.       No current facility-administered medications for this visit.    Allergies as of 09/19/2012 - Review Complete 09/19/2012  Allergen Reaction Noted  . Codeine Other (See Comments)   . Tricode ar [pseudoeph-chlorphen-codeine]  05/27/2012  . Other Rash 05/05/2010    Family History  Problem Relation Age of Onset  . Colon cancer Neg Hx   .  Inflammatory bowel disease Neg Hx   . Liver disease Neg Hx   . Anesthesia problems Neg Hx   . Hypotension Neg Hx   . Malignant hyperthermia Neg Hx   . Pseudochol deficiency Neg Hx     History   Social History  . Marital Status: Divorced    Spouse Name: N/A    Number of Children: 1  . Years of Education: N/A   Occupational History  . retired     disabled   Social History Main Topics  . Smoking status: Never Smoker   . Smokeless tobacco: Never Used  . Alcohol Use: No  . Drug Use: No  . Sexual Activity: Yes    Birth Control/ Protection: None   Other Topics Concern  . Not on file   Social History Narrative   The patient is divorced. The patient had one son.   Patient denies ever having smoked or used smokeless tobacco products. Patient denies current use of alcohol.      Mother and father are both deceased.    Review of Systems: As mentioned in HPI.   Physical Exam: BP 85/45  Pulse 89  Temp(Src) 98.2 F (36.8 C)  Ht 5\' 10"  (1.778 m)  Wt 209 lb 12.8 oz (95.165 kg)  BMI 30.1 kg/m2 General:   Alert and oriented. Appears pale, weak.  Head:  Normocephalic and atraumatic. Heart:  S1, S2 present, irregularly irregular Abdomen:  +BS, soft, non-tender and non-distended. Umbilical hernia, chronic.  Msk:  Symmetrical without gross deformities. Slow with walking, weak.  Extremities:  Without edema. Neurologic:  Alert and  oriented x4;  Very mild asterixis. Skin:  pale Psych:  Alert and cooperative. Normal mood and affect.

## 2012-09-19 NOTE — Assessment & Plan Note (Signed)
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 Physical exam without ascites, irregular heart rate noted. Likely dehydration at this point and unable to rule out cardiac etiology. Highly doubt GI process; he has no evidence of overt GI bleeding and only mild asterixis. Otherwise, no mental status changes or confusion.   I have asked him to present to the ED for a thorough evaluation. He has agreed. I informed the charge nurse of his pending arrival. Daughter present and taking him. We will attempt to obtain prior authorization for Xifaxan.

## 2012-09-20 ENCOUNTER — Encounter (HOSPITAL_COMMUNITY): Payer: Self-pay | Admitting: Internal Medicine

## 2012-09-20 DIAGNOSIS — R5381 Other malaise: Secondary | ICD-10-CM

## 2012-09-20 DIAGNOSIS — I4891 Unspecified atrial fibrillation: Secondary | ICD-10-CM

## 2012-09-20 DIAGNOSIS — I959 Hypotension, unspecified: Secondary | ICD-10-CM | POA: Diagnosis present

## 2012-09-20 DIAGNOSIS — R531 Weakness: Secondary | ICD-10-CM | POA: Diagnosis present

## 2012-09-20 DIAGNOSIS — E861 Hypovolemia: Secondary | ICD-10-CM | POA: Diagnosis present

## 2012-09-20 DIAGNOSIS — D689 Coagulation defect, unspecified: Secondary | ICD-10-CM | POA: Diagnosis present

## 2012-09-20 DIAGNOSIS — N179 Acute kidney failure, unspecified: Secondary | ICD-10-CM

## 2012-09-20 HISTORY — DX: Coagulation defect, unspecified: D68.9

## 2012-09-20 HISTORY — DX: Morbid (severe) obesity due to excess calories: E66.01

## 2012-09-20 LAB — BASIC METABOLIC PANEL
BUN: 23 mg/dL (ref 6–23)
Chloride: 110 mEq/L (ref 96–112)
GFR calc Af Amer: 38 mL/min — ABNORMAL LOW (ref >90)
Potassium: 4.1 mEq/L (ref 3.5–5.1)
Sodium: 140 mEq/L (ref 135–145)

## 2012-09-20 LAB — GLUCOSE, CAPILLARY
Glucose-Capillary: 109 mg/dL — ABNORMAL HIGH (ref 70–99)
Glucose-Capillary: 103 mg/dL — ABNORMAL HIGH (ref 70–99)
Glucose-Capillary: 175 mg/dL — ABNORMAL HIGH (ref 70–99)

## 2012-09-20 LAB — CBC
HCT: 26.5 % — ABNORMAL LOW (ref 39.0–52.0)
Hemoglobin: 8.9 g/dL — ABNORMAL LOW (ref 13.0–17.0)
MCHC: 33.6 g/dL (ref 30.0–36.0)
RBC: 2.88 MIL/uL — ABNORMAL LOW (ref 4.22–5.81)

## 2012-09-20 NOTE — Care Management Note (Addendum)
    Page 1 of 2   09/22/2012     11:37:49 AM   CARE MANAGEMENT NOTE 09/22/2012  Patient:  BRISCOE, DANIELLO   Account Number:  [id number]  Date Initiated:  09/20/2012  Documentation initiated by:  Anibal Henderson  Subjective/Objective Assessment:   Admitted with orthostatic hypotension. Pt lives at home with sister and a brother, and plans to return home. He uses a walker to ambulate. He has used [facility] in the past and would like for them to come again     Action/Plan:   He thinks he would benefit from PT   Anticipated DC Date:  09/20/2012   Anticipated DC Plan:  HOME W HOME HEALTH SERVICES      DC Planning Services  CM consult      [facility] Choice  HOME HEALTH   Choice offered to / List presented to:  C-1 Patient        HH arranged  HH-2 PT      [facility] agency  [facility]   Status of service:  Completed, signed off Medicare Important Message given?  YES (If response is "NO", the following Medicare IM given date fields will be blank) Date Medicare IM given:  09/22/2012 Date Additional Medicare IM given:    Discharge Disposition:  HOME W HOME HEALTH SERVICES  Per UR Regulation:  Reviewed for med. necessity/level of care/duration of stay  If discussed at Long Length of Stay Meetings, dates discussed:    Comments:  09/22/12 1135 Arlyss Queen, RN BSN CM Pt discharge held yesterday due to elevated ammonia levels. Pt discharging today. [facility] aware of pts discharge and HH PT wil start within 48 hours of discharge. Pt and pts nurse aware of discharge arrangements.  09/21/12 1440 Arlyss Queen, RN BSN CM Pt discharged home today with [facility] PT. Alroy Bailiff of [facility] is aware and will collect the pts information from the chart. HH services will start within 48 hours of discharge. Pt has DME in the home. Pt and pts nurse aware of discharge arrangements.  09/20/12 1330 Anibal Henderson RN/CM

## 2012-09-20 NOTE — Evaluation (Signed)
Physical Therapy Evaluation Patient Details Name: [name] MRN: [id number] DOB: July 26, 1945 Today's Date: 09/20/2012 Time: 0830-0910 PT Time Calculation (min): 40 min  PT Assessment / Plan / Recommendation History of Present Illness   Pt is a 67 yo male who lives with his siblings who has been ambulatory with a walker.  Mr. Judie Bonus was admitted with diagnosis of orthostatic hypotension.   Clinical Impression  Pt has generalized weakness.  His BP supine was 132/57, he sat up and stated his dizziness increased to a 7/10 BP was taken and suprisingly BP was 137/95.  Pt continued to be dizzy therefore he was placed back in bed.  Pt will need continued therapy to improve safety of ambulation.    PT Assessment  Patient needs continued PT services    Follow Up Recommendations  Home health PT    Does the patient have the potential to tolerate intense rehabilitation    no  Barriers to Discharge  none      Equipment Recommendations  None recommended by PT    Recommendations for Other Services   none  Frequency Min 3X/week    Precautions / Restrictions Precautions Precautions: None Restrictions Weight Bearing Restrictions: No         Mobility  Bed Mobility Bed Mobility: Supine to Sit;Sitting - Scoot to [place] of Bed;Sit to Supine;Scooting to [facility] Supine to Sit: 6: Modified independent (Device/Increase time) Sitting - Scoot to Edge of Bed: 6: Modified independent (Device/Increase time) Sit to Supine: 6: Modified independent (Device/Increase time) Scooting to [facility]: 5: Supervision Transfers Transfers: Sit to Stand Sit to Stand: 4: Min assist Details for Transfer Assistance: Pt not transfered due to stating dizziness was 7/10.      Exercises     PT Diagnosis: Difficulty walking;Generalized weakness  PT Problem List: Decreased strength;Decreased activity tolerance PT Treatment Interventions: Gait training;Therapeutic activities;Therapeutic exercise     PT Goals(Current goals  can be found in the care plan section) Acute Rehab PT Goals PT Goal Formulation: With patient Time For Goal Achievement: 09/22/12 Potential to Achieve Goals: Good  Visit Information  Last PT Received On: 09/20/12       Prior Functioning  Home Living Family/patient expects to be discharged to:: Private residence Living Arrangements: Other relatives Available Help at Discharge: Family Type of Home: House Home Access: Stairs to enter Secretary/administrator of Steps: 2 Entrance Stairs-Rails: Right Home Layout: One level Home Equipment: Cane - single point;Walker - 2 wheels;Tub bench Prior Function Level of Independence: Independent with assistive device(s) Communication Communication: No difficulties Dominant Hand: Right    Cognition  Cognition Arousal/Alertness: Awake/alert Overall Cognitive Status: Within Functional Limits for tasks assessed    Extremity/Trunk Assessment Lower Extremity Assessment Lower Extremity Assessment: Generalized weakness;LLE deficits/detail RLE Deficits / Details: genreally 3+/5 LLE Deficits / Details: generally 3+/5   Balance    End of Session PT - End of Session Equipment Utilized During Treatment: Gait belt Activity Tolerance: Other (comment) Patient left: in bed;with call bell/phone within reach  GP     RUSSELL,CINDY 09/20/2012, 9:10 AM

## 2012-09-20 NOTE — Progress Notes (Signed)
[facility] PROGRESS NOTE  [name] LKG:[id number] DOB: 1945/09/11 DOA: 09/19/2012 PCP: Kirk Ruths, MD    Code Status: Full code Family Communication: No family present Disposition Plan: Eventual discharge to home.   Consultants:  None  Procedures:  None  Antibiotics:  None   HPI/Subjective: The patient is sitting up in bed. He says that he feels better, somewhat stronger, but he has not gotten up to walk independently yet. No complaints of chest pain, chest congestion, or abdominal pain.  Objective: Filed Vitals:   09/20/12 1419  BP: 139/55  Pulse: 58  Temp: 98.7 F (37.1 C)  Resp: 18    Intake/Output Summary (Last 24 hours) at 09/20/12 1553 Last data filed at 09/20/12 1403  Gross per 24 hour  Intake 2168.75 ml  Output    500 ml  Net 1668.75 ml   Filed Weights   09/19/12 1139 09/19/12 1717  Weight: 94.348 kg (208 lb) 95.165 kg (209 lb 12.8 oz)    Exam:   General:  Obese alert 67 year old Caucasian man sitting up in bed, in no acute distress.  Cardiovascular: Irregular, irregular.  Respiratory: Clear anteriorly, decreased breath sounds in the bases.  Abdomen: Umbilical hernia, positive bowel sounds, soft, nontender, nondistended.  Musculoskeletal: No acute hot joints.  Skin: Superficial abrasions on both knees, nonpurulent, nondraining. No significant surrounding erythema.  Neurologic: He is alert and oriented x3. Cranial nerves II through XII are intact. No asterixis.   Data Reviewed: Basic Metabolic Panel:  Recent Labs Lab 09/19/12 1219 09/20/12 0516  NA 138 140  K 4.4 4.1  CL 105 110  CO2 22 22  GLUCOSE 288* 121*  BUN 25* 23  CREATININE 2.24* 2.00*  CALCIUM 9.2 8.6   Liver Function Tests:  Recent Labs Lab 09/19/12 1219  AST 17  ALT 8  ALKPHOS 109  BILITOT 1.3*  PROT 7.5  ALBUMIN 2.9*   No results found for this basename: LIPASE, AMYLASE,  in the last 168 hours  Recent Labs Lab 09/19/12 1219   AMMONIA 136*   CBC:  Recent Labs Lab 09/19/12 1219 09/20/12 0516  WBC 7.9 4.9  NEUTROABS 6.8  --   HGB 10.1* 8.9*  HCT 30.0* 26.5*  MCV 92.3 92.0  PLT 94* 80*   Cardiac Enzymes:  Recent Labs Lab 09/19/12 1219  TROPONINI <0.30   BNP (last 3 results) No results found for this basename: PROBNP,  in the last 8760 hours CBG:  Recent Labs Lab 09/19/12 2131 09/20/12 0533 09/20/12 0744 09/20/12 1133  GLUCAP 197* 109* 103* 152*    No results found for this or any previous visit (from the past 240 hour(s)).   Studies: Dg Chest 2 View  09/19/2012   CLINICAL DATA:  Weakness. Dehydration. Hypotensive.  EXAM: CHEST  2 VIEW  COMPARISON:  02/28/2012 and 12/01/2009  FINDINGS: Left basilar pleural thickening or fluid appears stable. No evidence of acute infiltrate or edema. Right lung is clear. Heart size is stable. Thoracolumbar spine posterior fixation rods are again seen in place.  IMPRESSION: Chronic left pleural fluid or thickening.  No acute findings.   Electronically Signed   By: Myles Rosenthal   On: 09/19/2012 13:06    Scheduled Meds: . digoxin  0.125 mg Oral Daily  . fluticasone  1 spray Each Nare Daily  . gabapentin  300 mg Oral QHS  . insulin aspart  0-15 Units Subcutaneous TID WC  . insulin glargine  50 Units Subcutaneous QHS  . lactulose  10  g Oral QID  . mirtazapine  15 mg Oral QHS  . pantoprazole  40 mg Oral Daily  . propranolol  5 mg Oral BID  . rifaximin  550 mg Oral BID  . simvastatin  40 mg Oral q1800  . sodium chloride  3 mL Intravenous Q12H  . traZODone  50 mg Oral QHS   Continuous Infusions: . sodium chloride 75 mL/hr at 09/19/12 1220  . sodium chloride 100 mL/hr at 09/20/12 1541    Assessment:  Principal Problem:   Generalized weakness Active Problems:   Orthostatic hypotension   Hypotension, unspecified   Hypovolemia   Cirrhosis of liver without mention of alcohol   Pulmonary hypertension   Diabetes mellitus   Chronic atrial fibrillation    Chronic kidney disease   NASH (nonalcoholic steatohepatitis)   Esophageal varices   Anemia   Thrombocytopenia   Dehydration   Acute renal failure   Morbid obesity   Coagulopathy  1. Generalized weakness, likely secondary to hypovolemia and orthostatic hypotension/hypotension.  Orthostatic hypotension and hypotension secondary to hypovolemia in the setting of diuretic therapy and antihypertensive medications. Blood pressure better with hydration and holding furosemide and blood pressure medications (except propanolol).  Acute renal failure superimposed on stage III chronic kidney disease. The patient's baseline creatinine is 1.8-1.9, based on August 2014 laboratories.  Chronic atrial fibrillation. Rate is currently controlled on digoxin. Not on Coumadin secondary to coagulopathy and history of GI bleeding.  Cirrhosis secondary to [place]. Propanolol on hold secondary to relative hypotension. Continue rifaximin  Thrombocytopenia, coagulopathy, secondary to cirrhosis. Stable.  Anemia, multifactorial, secondary to cirrhosis and history of GI bleeding.  Elevated ammonia level with no clinical findings of acute hepatic encephalopathy. We'll continue lactulose.  Type 2 diabetes mellitus. Currently controlled on Lantus and sliding-scale NovoLog.    Plan:  1. Decrease IV fluids as his blood pressure and renal function has improved. 2. Place parameters on propanolol to hold for systolic blood pressures less than 95. 3. PT consultation.  Time spent: 30 minutes.     [facility]  [facility] Pager [phone]. If 7PM-7AM, please contact night-coverage at [email or web address], password [email or web address] 09/20/2012, 3:53 PM  LOS: 1 day

## 2012-09-21 DIAGNOSIS — I959 Hypotension, unspecified: Secondary | ICD-10-CM

## 2012-09-21 DIAGNOSIS — K7689 Other specified diseases of liver: Secondary | ICD-10-CM

## 2012-09-21 LAB — AMMONIA: Ammonia: 128 umol/L — ABNORMAL HIGH (ref 11–60)

## 2012-09-21 LAB — COMPREHENSIVE METABOLIC PANEL
ALT: 7 U/L (ref 0–53)
AST: 15 U/L (ref 0–37)
Albumin: 2.5 g/dL — ABNORMAL LOW (ref 3.5–5.2)
CO2: 21 mEq/L (ref 19–32)
Calcium: 8.4 mg/dL (ref 8.4–10.5)
GFR calc non Af Amer: 37 mL/min — ABNORMAL LOW (ref >90)
Sodium: 138 mEq/L (ref 135–145)
Total Protein: 6.7 g/dL (ref 6.0–8.3)

## 2012-09-21 LAB — CBC
MCH: 31.5 pg (ref 26.0–34.0)
Platelets: 74 10*3/uL — ABNORMAL LOW (ref 150–400)
RBC: 2.92 MIL/uL — ABNORMAL LOW (ref 4.22–5.81)
RDW: 14.3 % (ref 11.5–15.5)

## 2012-09-21 LAB — GLUCOSE, CAPILLARY
Glucose-Capillary: 141 mg/dL — ABNORMAL HIGH (ref 70–99)
Glucose-Capillary: 131 mg/dL — ABNORMAL HIGH (ref 70–99)

## 2012-09-21 LAB — PROTIME-INR: Prothrombin Time: 17.6 seconds — ABNORMAL HIGH (ref 11.6–15.2)

## 2012-09-21 MED ORDER — LACTULOSE 10 GM/15ML PO SOLN
20.0000 g | Freq: Four times a day (QID) | ORAL | Status: DC
  Administered 2012-09-21 – 2012-09-22 (×4): 20 g via ORAL
  Filled 2012-09-21 (×4): qty 30

## 2012-09-21 MED ORDER — LACTULOSE 10 GM/15ML PO SOLN: 20.0000 g | Freq: Four times a day (QID) | ORAL | Status: DC

## 2012-09-21 NOTE — Progress Notes (Signed)
Physical Therapy Treatment Patient Details Name: [name] MRN: [id number] DOB: December 07, 1945 Today's Date: 09/21/2012 Time: 4098-1191 PT Time Calculation (min): 30 min  PT Assessment / Plan / Recommendation  History of Present Illness     PT Comments   Pt is seen for treatment.  He c/o general malaise and nausea.  He states that he has not even been up in a chair.  He tolerated simple exercise in the bed with no problem.  He is independent in transfers in and out of bed.  He was able to ambulate 74' with a walker with fair stability.  He did not have any LOB but his gait pattern indicates weakness in LEs.  I have advised him to have someone walk with him when he first goes home.  He is currently functional within a home setting and had no dizziness.  He would benefit from HHPT, no DME needed.  Follow Up Recommendations        Does the patient have the potential to tolerate intense rehabilitation     Barriers to Discharge        Equipment Recommendations       Recommendations for Other Services    Frequency     Progress towards PT Goals Progress towards PT goals: Progressing toward goals  Plan Current plan remains appropriate    Precautions / Restrictions     Pertinent Vitals/Pain     Mobility  Bed Mobility Supine to Sit: 6: Modified independent (Device/Increase time) Sitting - Scoot to Edge of Bed: 6: Modified independent (Device/Increase time) Scooting to [facility]: 6: Modified independent (Device/Increase time) Transfers Transfers: Stand to Sit Sit to Stand: 5: Supervision;From bed Stand to Sit: 6: Modified independent (Device/Increase time);To chair/3-in-1 Ambulation/Gait Ambulation/Gait Assistance: 4: Min guard Ambulation Distance (Feet): 80 Feet Assistive device: Rolling walker Gait Pattern: Step-through pattern;Wide base of support;Right flexed knee in stance;Decreased stance time - right Gait velocity: slow and labored General Gait Details: gait is mildly unsteady  with a walker and I have advised him to have someone nearby when he walks at home, at least initially Stairs: No Wheelchair Mobility Wheelchair Mobility: No    Exercises General Exercises - Lower Extremity Ankle Circles/Pumps: AROM;Strengthening;Both;10 reps;Supine Heel Slides: AROM;Both;10 reps;Supine Hip ABduction/ADduction: AROM;Both;10 reps;Supine   PT Diagnosis:    PT Problem List:   PT Treatment Interventions:     PT Goals (current goals can now be found in the care plan section)    Visit Information  Last PT Received On: 09/21/12    Subjective Data      Cognition       Balance     End of Session PT - End of Session Equipment Utilized During Treatment: Gait belt Activity Tolerance: Patient tolerated treatment well Patient left: in chair;with call bell/phone within reach;with family/visitor present   GP     Konrad Penta 09/21/2012, 12:24 PM

## 2012-09-21 NOTE — Discharge Summary (Signed)
Physician Discharge Summary  [name] WUJ:[id number] DOB: October 03, 1945 DOA: 09/19/2012  PCP: Kirk Ruths, MD  Admit date: 09/19/2012 Discharge date: 09/21/2012  Time spent: 40 minutes  Recommendations for Outpatient Follow-up:  1. Follow up with PCP Dr. Regino Schultze in 1 week for evaluation of symptoms. Recommend BMET at that time to trend renal function.   2. HH PT for strength and endurance  Discharge Diagnoses:  Principal Problem:   Generalized weakness Active Problems:   Cirrhosis of liver without mention of alcohol   Pulmonary hypertension   Diabetes mellitus   Chronic atrial fibrillation   Chronic kidney disease   NASH (nonalcoholic steatohepatitis)   Esophageal varices   Anemia   Thrombocytopenia   Dehydration   Orthostatic hypotension   Hypotension, unspecified   Acute renal failure   Hypovolemia   Morbid obesity   Coagulopathy   Discharge Condition: stable at baseline  Diet recommendation: Carb modified  Filed Weights   09/19/12 1139 09/19/12 1717  Weight: 94.348 kg (208 lb) 95.165 kg (209 lb 12.8 oz)    History of present illness:  68 year old male with history of hypertension, hyperlipidemia, diabetes mellitus, chronic A. 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 He also slumped on the ground with weakness and lacerated his knees. In the clinic he was noted to be hypotensive with systolic blood pressure in the 80s and sent to the ED. He denied any headache, dizziness, lightheadedness, chest pain, shortness of breath, palpitations, nausea, vomiting, fever, chills, abdominal pain, bowel or urinary symptoms. Denied any hematemesis or melena. In the ED  patient was noted to be significantly orthostatic and started on IV fluids . blood work done showed baseline hemoglobin and mildly worsened renal function.      Hospital Course:   Generalized weakness, likely secondary to hypovolemia and orthostatic hypotension. Pt admitted to medical floor and provided with supportive therapy in the form of gentle IV fluids. His diuretics were held initially as well. He quickly responded. He was evaluated by physical therapy and was able to amblate 37' with walker with fair stability. He was advised to have assistance with ambulating initially upon discharge. In addition [facility] PT recommended and that has been arranged.   Orthostatic hypotension and hypotension secondary to hypovolemia in the setting of diuretic therapy and antihypertensive medications. Blood pressure improved with hydration and holding furosemide and blood pressure medications (except propanolol). On day of discharge SBP range 152-157.  Acute renal failure superimposed on stage III chronic kidney disease. The patient's baseline creatinine is 1.8-1.9, based on August 2014 laboratories. On admission creatinine 2.24. At discharge creatinine 1.84 his baseline.   Chronic atrial fibrillation. Rate is currently controlled on digoxin. Not on Coumadin secondary to coagulopathy and history of GI bleeding.   Cirrhosis secondary to [place]. Propanolol resumed. Continue rifaximin. Ammonia level on admission 136. At discharge level 128. Lactulose has been increased to 20g QID.   Thrombocytopenia, coagulopathy, secondary to cirrhosis. Stable at baseline.   Anemia, multifactorial, secondary to cirrhosis and history of GI bleeding. Stable at baseline.  Elevated ammonia level with no clinical findings of acute hepatic encephalopathy. Trending down slowly at discharge. Lactulose increased to 20g QID.    Type 2 diabetes mellitus. Currently controlled on Lantus  and sliding-scale NovoLog.   Procedures:  none Consultations:  none  Discharge Exam: Filed Vitals:   09/21/12 1413  BP: 152/65  Pulse: 68  Temp: 98 F (36.7 C)  Resp: 18    General: obese, sitting on side of bed. Denies pain/discomfort. Reports feeling better.  Cardiovascular: irregularly irregular. HS somewhat distant. Lower extremities with trace edema  Respiratory: normal effort but respirations somewhat shallow. BS are diminished bilateral bases, otherwise clear to ausculation. Neuro: Mild asterixis on exam.  Discharge Instructions   Future Appointments Provider Department Dept Phone   10/09/2012 9:10 AM [facility] [phone]   12/07/2012 9:20 AM [facility] [place] [facility] [phone]   12/07/2012 10:00 AM [facility] Provider [place] [facility] [phone]       Medication List         ALPRAZolam 0.5 MG tablet  Commonly known as:  XANAX  Take 0.5 mg by mouth 2 (two) times daily as needed.     DIGOX 0.125 MG tablet  Generic drug:  digoxin  TAKE ONE TABLET BY MOUTH DAILY.     furosemide 20 MG tablet  Commonly known as:  LASIX  Take 0.5 tablets (10 mg total) by mouth daily.     gabapentin 300 MG capsule  Commonly known as:  NEURONTIN  Take 300 mg by mouth at bedtime.     lactulose 10 GM/15ML solution  Commonly known as:  CHRONULAC  Take 30 mLs (20 g total) by mouth 4 (four) times daily.     LANTUS 100 UNIT/ML injection  Generic drug:  insulin glargine  Inject 60-75 Units into the skin nightly.     lisinopril 5 MG tablet  Commonly known as:  PRINIVIL,ZESTRIL  Take 5 mg by mouth daily.     mirtazapine 15 MG tablet  Commonly known as:  REMERON  15 mg. Take 15 mg by mouth nightly.     mometasone 50 MCG/ACT nasal spray  Commonly known as:  NASONEX  Place 2 sprays into the nose 2 (two) times daily.     NOVOLOG 100 UNIT/ML injection  Generic drug:  insulin aspart  Inject 0-20 Units into the skin 2 (two) times daily. Patient is on Sliding Scale      ondansetron 4 MG tablet  Commonly known as:  ZOFRAN  4 mg. Take 4 mg by mouth every 8 (eight) hours as needed for Nausea.     pantoprazole 40 MG tablet  Commonly known as:  PROTONIX  TAKE (1) TABLET BY MOUTH TWICE DAILY.     propranolol 10 MG tablet  Commonly known as:  INDERAL  Take 0.5 tablets (5 mg total) by mouth 2 (two) times daily.     rifaximin 200 MG tablet  Commonly known as:  XIFAXAN  Take 550 mg by mouth 2 (two) times daily.     simvastatin 40 MG tablet  Commonly known as:  ZOCOR  40 mg. Take 40 mg by mouth nightly.     traZODone 50 MG tablet  Commonly known as:  DESYREL  50 mg. Take 50 mg by mouth nightly.       Allergies  Allergen Reactions  . Codeine Other (See Comments)    Can't function - legs won't move,etc.  . Tricode Ar [Pseudoeph-Chlorphen-Codeine]   . Other Rash    Adhesive tape       Follow-up Information   Follow up with Kirk Ruths, MD. Schedule an appointment as soon as possible for a  visit in 1 week. (evaluation of symptoms. recommend BMET)    Specialty:  Family Medicine   Contact information:   [address] [place] [address] [phone]        The results of significant diagnostics from this hospitalization (including imaging, microbiology, ancillary and laboratory) are listed below for reference.    Significant Diagnostic Studies: Dg Chest 2 View  09/19/2012   CLINICAL DATA:  Weakness. Dehydration. Hypotensive.  EXAM: CHEST  2 VIEW  COMPARISON:  02/28/2012 and 12/01/2009  FINDINGS: Left basilar pleural thickening or fluid appears stable. No evidence of acute infiltrate or edema. Right lung is clear. Heart size is stable. Thoracolumbar spine posterior fixation rods are again seen in place.  IMPRESSION: Chronic left pleural fluid or thickening.  No acute findings.   Electronically Signed   By: Myles Rosenthal   On: 09/19/2012 13:06    Microbiology: No results found for this or any previous visit  (from the past 240 hour(s)).   Labs: Basic Metabolic Panel:  Recent Labs Lab 09/19/12 1219 09/20/12 0516 09/21/12 0500  NA 138 140 138  K 4.4 4.1 3.9  CL 105 110 109  CO2 22 22 21   GLUCOSE 288* 121* 207*  BUN 25* 23 21  CREATININE 2.24* 2.00* 1.84*  CALCIUM 9.2 8.6 8.4   Liver Function Tests:  Recent Labs Lab 09/19/12 1219 09/21/12 0500  AST 17 15  ALT 8 7  ALKPHOS 109 121*  BILITOT 1.3* 0.8  PROT 7.5 6.7  ALBUMIN 2.9* 2.5*   No results found for this basename: LIPASE, AMYLASE,  in the last 168 hours  Recent Labs Lab 09/19/12 1219 09/21/12 0500  AMMONIA 136* 128*   CBC:  Recent Labs Lab 09/19/12 1219 09/20/12 0516 09/21/12 0500  WBC 7.9 4.9 4.4  NEUTROABS 6.8  --   --   HGB 10.1* 8.9* 9.2*  HCT 30.0* 26.5* 26.7*  MCV 92.3 92.0 91.4  PLT 94* 80* 74*   Cardiac Enzymes:  Recent Labs Lab 09/19/12 1219  TROPONINI <0.30   BNP: BNP (last 3 results) No results found for this basename: PROBNP,  in the last 8760 hours CBG:  Recent Labs Lab 09/20/12 1637 09/20/12 2028 09/21/12 0726 09/21/12 1036 09/21/12 1137  GLUCAP 186* 175* 141* 101* 147*       Signed:  BLACK,KAREN M  [facility] 09/21/2012, 2:30 PM    Attending  Patient was seen and examined. He was discussed with NP, Ms Black. Agree with findings. Will delay discharge to attempt to decrease ammonia more; he has mild asterixis.  Elliot Cousin, MD

## 2012-09-22 DIAGNOSIS — E861 Hypovolemia: Secondary | ICD-10-CM

## 2012-09-22 LAB — GLUCOSE, CAPILLARY: Glucose-Capillary: 188 mg/dL — ABNORMAL HIGH (ref 70–99)

## 2012-09-22 LAB — AMMONIA: Ammonia: 39 umol/L (ref 11–60)

## 2012-09-22 MED ORDER — INSULIN GLARGINE 100 UNIT/ML ~~LOC~~ SOLN
30.0000 [IU] | Freq: Every day | SUBCUTANEOUS | Status: DC
  Filled 2012-09-22 (×3): qty 0.3

## 2012-09-22 MED ORDER — DEXTROSE 50 % IV SOLN
INTRAVENOUS | Status: AC
  Administered 2012-09-22: 50 mL
  Filled 2012-09-22: qty 50

## 2012-09-22 MED ORDER — INSULIN GLARGINE 100 UNIT/ML ~~LOC~~ SOLN: 30.0000 [IU] | Freq: Every day | SUBCUTANEOUS | Status: DC

## 2012-09-22 NOTE — Discharge Summary (Addendum)
Physician Discharge Summary  [name] ZOX:[id number] DOB: 05/01/1945 DOA: 09/19/2012  PCP: Kirk Ruths, MD  Admit date: 09/19/2012 Discharge date: 09/22/2012  Time spent: 40 minutes  Recommendations for Outpatient Follow-up:  1.Follow up with PCP Dr. Regino Schultze on 10/06/12 for evaluation of symptoms. Recommend reassessing electrolytes/basic metabolic panel at that time to trend renal function.   2. HH PT for strength and endurance  Discharge Diagnoses:    Orthostatic hypotension and dehydration resulting in ARF and generalized weakness.   Cirrhosis of liver without mention of alcohol   Pulmonary hypertension   Diabetes mellitus   Chronic atrial fibrillation   Chronic kidney disease   NASH (nonalcoholic steatohepatitis)   Esophageal varices   Anemia   Thrombocytopenia   Dehydration   Orthostatic hypotension   Hypotension, unspecified   Acute renal failure   Hypovolemia   Morbid obesity   Coagulopathy   Discharge Condition: stable  Diet recommendation: carb modified  Filed Weights   09/19/12 1139 09/19/12 1717  Weight: 94.348 kg (208 lb) 95.165 kg (209 lb 12.8 oz)    History of present illness:  67 year old male with history of hypertension, hyperlipidemia, diabetes mellitus, chronic A. 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 He also slumped on the ground with weakness and lacerated his knees. In the clinic he was noted to be hypotensive with systolic blood pressure in the 80s and sent to the ED. He denied any headache, dizziness, lightheadedness, chest pain, shortness of breath, palpitations, nausea, vomiting, fever, chills, abdominal pain, bowel or urinary  symptoms. Denied any hematemesis or melena. In the ED the patient was noted to be significantly orthostatic. He was started on IV fluids . blood work done showed baseline hemoglobin and mildly worsened renal function   Hospital Course:  Generalized weakness, likely secondary to hypovolemia and orthostatic hypotension. Pt admitted to medical floor and provided with supportive therapy in the form of gentle IV fluids. His diuretics were held initially as well. He quickly responded. He was evaluated by physical therapy and was able to amblate 74' with a walker with fair stability. He was advised to have assistance with ambulating initially upon discharge. In addition,  HH PT was recommended and that has been arranged.   Orthostatic hypotension and hypotension secondary to hypovolemia in the setting of diuretic therapy and antihypertensive medications. Blood pressure improved with hydration and holding furosemide and blood pressure medications (except propanolol). On day of discharge SBP range was 152-157.   Acute renal failure superimposed on stage III chronic kidney disease. The patient's baseline creatinine is 1.8-1.9, based on August 2014 laboratories. On admission his creatinine was 2.24. At discharge, his creatinine was 1.84 his baseline.   Chronic atrial fibrillation. Rate is currently controlled on digoxin. Not on Coumadin secondary to coagulopathy and history of GI bleeding.   Cirrhosis secondary to [place]. Propanolol resumed. Continue rifaximin. Ammonia level on admission was 136. At discharge level it was 39 . Lactulose has been increased to 20g QID.   Thrombocytopenia, coagulopathy, secondary to cirrhosis. Stable at baseline.   Anemia, multifactorial, secondary to cirrhosis and history of GI bleeding. Stable at baseline.   Elevated ammonia level with no clinical findings of acute hepatic encephalopathy.  Level within the limits of  normal at discharge. Lactulose increased to 20g QID.   Type 2  diabetes mellitus. Hypoglycemic episode this am. Patient was on 50units Lantus which is home dose. CBG range while in the hospital prior to this episode has been 100-200. Will decrease Lantus dose to 30units. He was advised that if CBG's are consistently over 200 at home to resume prior dose of lantus.     Procedures:  none  Consultations:  none  Discharge Exam: Filed Vitals:   09/22/12 1416  BP: 122/50  Pulse: 75  Temp: 99 F (37.2 C)  Resp: 18    General:  He is sitting up in the chair. Alert, NAD. Cardiovascular: irregularly irregular.  Lower extremities with trace edema Respiratory: normal effort; decreased breath sounds at bases, otherwise clear. Neurologic: He is alert and oriented x2. No asterixis on exam today.   Discharge Instructions      Discharge Orders   Future Appointments Provider Department Dept Phone   10/09/2012 9:10 AM [facility] [phone]   12/07/2012 9:20 AM [facility] [phone]   12/07/2012 10:00 AM [facility] Provider [facility] [phone]   Future Orders Complete By Expires   Diet - low sodium heart healthy  As directed    Discharge instructions  As directed    Comments:     Continue to check your blood sugar 4 times daily. Your insulin dose has been reduced to 30units at bedtime. If your blood sugar is greater than 200 consistently resume previous dose of 50 units.   Increase activity slowly  As directed        Medication List         ALPRAZolam 0.5 MG tablet  Commonly known as:  XANAX  Take 0.5 mg by mouth 2 (two) times daily as needed.     DIGOX 0.125 MG tablet  Generic drug:  digoxin  TAKE ONE TABLET BY MOUTH DAILY.     furosemide 20 MG tablet  Commonly known as:  LASIX  Take 0.5 tablets (10 mg total) by mouth daily.     gabapentin 300 MG capsule  Commonly known as:  NEURONTIN  Take 300 mg by mouth at bedtime.     insulin glargine 100 UNIT/ML  injection  Commonly known as:  LANTUS  Inject 0.3 mLs (30 Units total) into the skin at bedtime.     lactulose 10 GM/15ML solution  Commonly known as:  CHRONULAC  Take 30 mLs (20 g total) by mouth 4 (four) times daily.     lisinopril 5 MG tablet  Commonly known as:  PRINIVIL,ZESTRIL  Take 5 mg by mouth daily.     mirtazapine 15 MG tablet  Commonly known as:  REMERON  15 mg. Take 15 mg by mouth nightly.     mometasone 50 MCG/ACT nasal spray  Commonly known as:  NASONEX  Place 2 sprays into the nose 2 (two) times daily.     NOVOLOG 100 UNIT/ML injection  Generic drug:  insulin aspart  Inject 0-20 Units into the skin 2 (two) times daily. Patient is on Sliding Scale     ondansetron 4 MG tablet  Commonly known as:  ZOFRAN  4 mg. Take 4 mg by mouth every 8 (eight) hours as needed for Nausea.     pantoprazole 40 MG tablet  Commonly known as:  PROTONIX  TAKE (1) TABLET BY MOUTH TWICE DAILY.     propranolol 10 MG tablet  Commonly known  as:  INDERAL  Take 0.5 tablets (5 mg total) by mouth 2 (two) times daily.     rifaximin 200 MG tablet  Commonly known as:  XIFAXAN  Take 550 mg by mouth 2 (two) times daily.     simvastatin 40 MG tablet  Commonly known as:  ZOCOR  40 mg. Take 40 mg by mouth nightly.     traZODone 50 MG tablet  Commonly known as:  DESYREL  50 mg. Take 50 mg by mouth nightly.       Allergies  Allergen Reactions  . Codeine Other (See Comments)    Can't function - legs won't move,etc.  . Tricode Ar [Pseudoeph-Chlorphen-Codeine]   . Other Rash    Adhesive tape   Follow-up Information   Follow up with Kirk Ruths, MD. Schedule an appointment as soon as possible for a visit on 10/06/2012. (appointment at 2pm for evaluation of symptoms )    Specialty:  Family Medicine   Contact information:   [address] [place] [address] [phone]       Follow up with [facility].   Contact information:   [address] [place] [address] [phone]       The results of significant diagnostics from this hospitalization (including imaging, microbiology, ancillary and laboratory) are listed below for reference.    Significant Diagnostic Studies: Dg Chest 2 View  09/19/2012   CLINICAL DATA:  Weakness. Dehydration. Hypotensive.  EXAM: CHEST  2 VIEW  COMPARISON:  02/28/2012 and 12/01/2009  FINDINGS: Left basilar pleural thickening or fluid appears stable. No evidence of acute infiltrate or edema. Right lung is clear. Heart size is stable. Thoracolumbar spine posterior fixation rods are again seen in place.  IMPRESSION: Chronic left pleural fluid or thickening.  No acute findings.   Electronically Signed   By: Myles Rosenthal   On: 09/19/2012 13:06    Microbiology: No results found for this or any previous visit (from the past 240 hour(s)).   Labs: Basic Metabolic Panel:  Recent Labs Lab 09/19/12 1219 09/20/12 0516 09/21/12 0500  NA 138 140 138  K 4.4 4.1 3.9  CL 105 110 109  CO2 22 22 21   GLUCOSE 288* 121* 207*  BUN 25* 23 21  CREATININE 2.24* 2.00* 1.84*  CALCIUM 9.2 8.6 8.4   Liver Function Tests:  Recent Labs Lab 09/19/12 1219 09/21/12 0500  AST 17 15  ALT 8 7  ALKPHOS 109 121*  BILITOT 1.3* 0.8  PROT 7.5 6.7  ALBUMIN 2.9* 2.5*   No results found for this basename: LIPASE, AMYLASE,  in the last 168 hours  Recent Labs Lab 09/19/12 1219 09/21/12 0500 09/22/12 0502  AMMONIA 136* 128* 39   CBC:  Recent Labs Lab 09/19/12 1219 09/20/12 0516 09/21/12 0500  WBC 7.9 4.9 4.4  NEUTROABS 6.8  --   --   HGB 10.1* 8.9* 9.2*  HCT 30.0* 26.5* 26.7*  MCV 92.3 92.0 91.4  PLT 94* 80* 74*   Cardiac Enzymes:  Recent Labs Lab 09/19/12 1219  TROPONINI <0.30   BNP: BNP (last 3 results) No results found for this basename: PROBNP,  in the last 8760 hours CBG:  Recent Labs Lab 09/21/12 1638 09/21/12 2046 09/22/12 0919 09/22/12 1020 09/22/12 1131  GLUCAP 131* 229* 39*  185* 188*       Signed:  BLACK,KAREN M  [facility] 09/22/2012, 2:34 PM    Attending:  The patient was seen and  examined. He was discussed with nurse practitioner, Ms. black. The above note has been amended and/or annotated. Agree with the findings.   Elliot Cousin, M.D. [phone]

## 2012-09-23 NOTE — Progress Notes (Signed)
Patient  Received discharge instructions along with follow up appointments and prescriptions. Patient verbalized understanding of all instructions. Patient was escorted by staff via wheelchair to vehicle . Patient discharged to home in stable condition.

## 2012-09-29 ENCOUNTER — Other Ambulatory Visit: Payer: Self-pay | Admitting: Gastroenterology

## 2012-10-09 ENCOUNTER — Encounter (HOSPITAL_COMMUNITY): Payer: Medicare Other | Attending: Oncology

## 2012-10-09 DIAGNOSIS — D649 Anemia, unspecified: Secondary | ICD-10-CM

## 2012-10-09 DIAGNOSIS — D509 Iron deficiency anemia, unspecified: Secondary | ICD-10-CM | POA: Insufficient documentation

## 2012-10-09 DIAGNOSIS — D696 Thrombocytopenia, unspecified: Secondary | ICD-10-CM | POA: Insufficient documentation

## 2012-10-09 LAB — FERRITIN: Ferritin: 114 ng/mL (ref 22–322)

## 2012-10-09 LAB — CBC WITH DIFFERENTIAL/PLATELET
Basophils Relative: 1 % (ref 0–1)
Eosinophils Absolute: 0.1 10*3/uL (ref 0.0–0.7)
Eosinophils Relative: 3 % (ref 0–5)
Lymphs Abs: 0.5 10*3/uL — ABNORMAL LOW (ref 0.7–4.0)
MCH: 31.6 pg (ref 26.0–34.0)
MCHC: 33.7 g/dL (ref 30.0–36.0)
MCV: 93.9 fL (ref 78.0–100.0)
Neutrophils Relative %: 73 % (ref 43–77)
Platelets: 97 10*3/uL — ABNORMAL LOW (ref 150–400)
RBC: 2.97 MIL/uL — ABNORMAL LOW (ref 4.22–5.81)

## 2012-10-09 LAB — IRON AND TIBC: UIBC: 185 ug/dL (ref 125–400)

## 2012-10-09 NOTE — Progress Notes (Signed)
Labs drawn today for cbc/diff,ferr,Iron and IBC 

## 2012-10-11 ENCOUNTER — Telehealth: Payer: Self-pay | Admitting: Internal Medicine

## 2012-10-11 NOTE — Telephone Encounter (Signed)
Pt's daughter Marcelino Duster) called and asked for JL to call either her [phone]) or patient [phone]) regarding financial assistance on his medication.

## 2012-10-18 NOTE — Telephone Encounter (Signed)
Spoke with pt- he was wanting to know if we had heard anything from the patient assistance company about his xifaxan. I informed him that I had a confirmation that they had received the paperwork. He said someone had called him the other day but he wasn't home when they called and he wasn't sure who to call back. Informed him that I would try to call the company this afternoon and see what is going on.   Pt also wants to know if when he needs a follow up visit. He is aware that [facility] is out of the office today.

## 2012-10-19 NOTE — Telephone Encounter (Signed)
Susan, please schedule pt. 

## 2012-10-19 NOTE — Telephone Encounter (Signed)
Noted. Let's go ahead and have him return for a non-urgent visit in near future.

## 2012-10-19 NOTE — Telephone Encounter (Signed)
Pt is aware of OV on 11/4 at 2 with [facility] and appt card was mailed

## 2012-11-07 ENCOUNTER — Ambulatory Visit (INDEPENDENT_AMBULATORY_CARE_PROVIDER_SITE_OTHER): Payer: Medicare Other | Admitting: Gastroenterology

## 2012-11-07 ENCOUNTER — Encounter: Payer: Self-pay | Admitting: Gastroenterology

## 2012-11-07 ENCOUNTER — Encounter (INDEPENDENT_AMBULATORY_CARE_PROVIDER_SITE_OTHER): Payer: Self-pay

## 2012-11-07 VITALS — BP 118/58 | HR 86 | Temp 98.3°F | Wt 206.8 lb

## 2012-11-07 DIAGNOSIS — K7460 Unspecified cirrhosis of liver: Secondary | ICD-10-CM

## 2012-11-07 DIAGNOSIS — R3911 Hesitancy of micturition: Secondary | ICD-10-CM | POA: Insufficient documentation

## 2012-11-07 MED ORDER — LACTULOSE 10 GM/15ML PO SOLN: 20.0000 g | Freq: Four times a day (QID) | ORAL | Status: DC

## 2012-11-07 NOTE — Progress Notes (Signed)
Referring Provider: Karleen Hampshire, MD Primary Care Physician:  Kirk Ruths, MD Primary GI: Dr. Jena Gauss   Chief Complaint  Patient presents with  . Follow-up    HPI:   [name] presents today in follow-up with history of NASH cirrhosis, s/p TIPS in the past secondary to refractory ascites. Crohn's disease in remission. Difficulty in past obtaining Xifaxan, as it is not covered by insurance. Due for [place] of abdomen now. TCS due in Jan 2018.  If he eats a lot, has gas. Has to stretch to relieve it. Thinks he has a hernia in his left groin. No melena, hematochezia. No abdominal pain. Denies confusion, mental status changes.    Past Medical History  Diagnosis Date  . Hypertension     negative stress nuclear study in 02/2007; normal coronaries in 2006; Echo in 2006-aortic sclerosis, Mild biatrial enlargement; normal LV  . Pulmonary hypertension     Mild pulmonary hypertension with normal PVR by 2010 cath [facility])  . Hyperlipidemia   . Syncope 2008    orthostatic hypotension  . Diabetes mellitus     insulin; onset in 1999; retinopathy, neuropathy and nephropathy  . Chronic atrial fibrillation     onset in 1995; anticoagulation discontinued in 2006  . Crohn's disease     -lower GI bleed in 08/2002;  Pentasa stopped by Dr. Brooke Dare at [facility] ; clinical remission.  . Obesity   . NASH (nonalcoholic steatohepatitis)     cirrhosis with esophageal varices; h/o GI bleeding. EGD-2010 grade 2 esophageal varices-banded with good hemostasis; portal gastropathy, friable gastric mucosa; hiatal hernia.  EGD- recurrent melena one week later showed 2 eschars at previously placed bands, decompressed esophageal varices grade 1-2,four columns, no bleeding stigmata, normal appearing small bowel, pt has had 1 Hep A/B vaccine.    . Esophageal varices     s/p band ligation multiple times, Portal Hypertensive gastropathy;  . S/P TIPS (transjugular intrahepatic portosystemic shunt)     06/2009-TIPS procedure  at [facility]  . GI bleed     07/2009-admitted with recurrent upper GI bleed secondary to esophageal varices-banded x3  . Pleural effusion, left     s/p thoracentesis-multiple  . Depression   . Anemia     Iron def requiring parenteral iron or injections in past, followed by Dr. Glenford Peers; B12 def  . Duodenal ulcer 1980s  . Ascites   . Hiatal hernia 12/02/2009  . Esophageal erosions     consistent with mild erosive reflux esophagitis  . Portal hypertensive gastropathy   . 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 Jena Gauss  . Cancer 10/2011  . Squamous cell carcinoma of face   . Pneumonia     long time ago  . Shortness of breath   . GERD (gastroesophageal reflux disease)   . Arthritis     knees & ankles   . S/P radiation therapy 01/07/12 thru 03/21/2012    Dr. Thersa Salt; Right cheek/parotid and neck nodes  . Chronic kidney disease     stage III; creatinine of 1.6 in 8/07, 1.35 and 05/2008  . Nephrolithiasis     cystoscopy with stone extraction in 1990,   . Morbid obesity 09/20/2012  . Coagulopathy 09/20/2012    Past Surgical History  Procedure Laterality Date  . Tips procedure  09/16/09  . 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 Cholecystectomy    . Circumcision  -1992  .  Colonoscopy  -2008    Reportedly normal, done in [place]  . Multiple thoracenteses    . Esophagogastroduodenoscopy  12/02/2009    distal esophageal erosions, small hiatal hernia  . Tips procedure  2011  . Colonoscopy  01/26/2011    RMR: adequate preparation.  normal rectum. Somewhat  diffusely friable colonic mucosa. A single 4 mm polyp in the ascending segment. couple of mucosal breaks in the distal 10 cm of terminal ileum with  a couple of erosions at the junction  the TI  and colon.. Tubular adenoma, chronic ileitis. Next TCS 01/2016.  Marland Kitchen Esophagogastroduodenoscopy  01/26/2011    RMR: Decompressed/abscent esophagel varices s/p TIPS. small  hh. GAVE. Procedure: ESOPHAGOGASTRODUODENOSCOPY (EGD);  Surgeon: Corbin Ade, MD;  [facility];  Service: Endoscopy;  Laterality: N/A;  . Esophagogastroduodenoscopy  04/22/2011    RMR: GAVE.-status post APC ablation as described above  . Esophagogastroduodenoscopy  05/20/2011    RMR: Portal gastropathy.  GAVE - status post APC ablation  . Hot hemostasis  05/20/2011    Procedure: HOT HEMOSTASIS (ARGON PLASMA COAGULATION/BICAP);  Surgeon: Corbin Ade, MD;  Location: [facility];  Service: Endoscopy;  Laterality: N/A;  . Esophagogastroduodenoscopy  07/14/2005    Persisting two columns of grade 2 esophageal varices with one cherry red spot on one of these columns distally.  Otherwise normal  . Skin cancer excision  Oct 2013    large lesion rt side of face  . Cardiac catheterization       04/12/2008 [facility]): no significant CAD, mild pulmonary hypertension with normal PVR, normal pulmonary capillary wedge pressure, normal left ventricular end-diastolic pressure.  . Multiple extractions with alveoloplasty  01/14/2012    Procedure: MULTIPLE EXTRACION WITH ALVEOLOPLASTY;  Surgeon: Charlynne Pander, DDS;  Location: [facility] OR;  Service: Oral Surgery;  Laterality: N/A;  Extraction of tooth #'s [phone] with alveoloplasty and gross debridement of remaining teeth    Current Outpatient Prescriptions  Medication Sig Dispense Refill  . ALPRAZolam (XANAX) 0.5 MG tablet Take 0.5 mg by mouth 2 (two) times daily as needed.       Marland Kitchen DIGOX 0.125 MG tablet TAKE ONE TABLET BY MOUTH DAILY.  30 tablet  6  . furosemide (LASIX) 20 MG tablet TAKE 1/2 TABLET BY MOUTH DAILY.  30 tablet  5  . gabapentin (NEURONTIN) 300 MG capsule Take 300 mg by mouth at bedtime.       . insulin aspart (NOVOLOG) 100 UNIT/ML injection Inject 0-20 Units into the skin 2 (two) times daily. Patient is on Sliding Scale      . insulin glargine (LANTUS) 100 UNIT/ML injection Inject 0.3 mLs (30 Units total) into the skin at bedtime.  10  mL  12  . lactulose (CHRONULAC) 10 GM/15ML solution Take 30 mLs (20 g total) by mouth 4 (four) times daily.  240 mL  0  . lisinopril (PRINIVIL,ZESTRIL) 5 MG tablet Take 5 mg by mouth daily.       . mirtazapine (REMERON) 15 MG tablet 15 mg. Take 15 mg by mouth nightly.      . mometasone (NASONEX) 50 MCG/ACT nasal spray Place 2 sprays into the nose 2 (two) times daily.      . ondansetron (ZOFRAN) 4 MG tablet 4 mg. Take 4 mg by mouth every 8 (eight) hours as needed for Nausea.      . pantoprazole (PROTONIX) 40 MG tablet TAKE (1) TABLET BY MOUTH TWICE DAILY.  60 tablet  5  . propranolol (  INDERAL) 10 MG tablet Take 0.5 tablets (5 mg total) by mouth 2 (two) times daily.  45 tablet  3  . simvastatin (ZOCOR) 40 MG tablet 40 mg. Take 40 mg by mouth nightly.      . traZODone (DESYREL) 50 MG tablet 50 mg. Take 50 mg by mouth nightly.      . rifaximin (XIFAXAN) 200 MG tablet Take 550 mg by mouth 2 (two) times daily.        No current facility-administered medications for this visit.    Allergies as of 11/07/2012 - Review Complete 11/07/2012  Allergen Reaction Noted  . Codeine Other (See Comments)   . Tricode ar [pseudoeph-chlorphen-codeine]  05/27/2012  . Other Rash 05/05/2010    Family History  Problem Relation Age of Onset  . Colon cancer Neg Hx   . Inflammatory bowel disease Neg Hx   . Liver disease Neg Hx   . Anesthesia problems Neg Hx   . Hypotension Neg Hx   . Malignant hyperthermia Neg Hx   . Pseudochol deficiency Neg Hx     History   Social History  . Marital Status: Divorced    Spouse Name: N/A    Number of Children: 1  . Years of Education: N/A   Occupational History  . retired     disabled   Social History Main Topics  . Smoking status: Never Smoker   . Smokeless tobacco: Never Used  . Alcohol Use: No  . Drug Use: No  . Sexual Activity: Yes    Birth Control/ Protection: None   Other Topics Concern  . None   Social History Narrative   The patient is divorced.  The patient had one son.   Patient denies ever having smoked or used smokeless tobacco products. Patient denies current use of alcohol.      Mother and father are both deceased.    Review of Systems: Negative unless mentioned in HPI.   Physical Exam: BP 118/58  Pulse 86  Temp(Src) 98.3 F (36.8 C) (Oral)  Wt 206 lb 12.8 oz (93.804 kg) General:   Alert and oriented. No distress noted. Pleasant and cooperative.  Head:  Normocephalic and atraumatic. Eyes:  Conjuctiva clear without scleral icterus. Heart:  S1, S2 present irregularly irregular Abdomen:  +BS, soft, non-tender and non-distended. Obvious chronic umbilical hernia Msk:  Symmetrical without gross deformities. Normal posture Extremities:  Without edema. Neurologic:  Alert and  oriented x4;  grossly normal neurologically. Skin:  Intact without significant lesions or rashes. Psych:  Alert and cooperative. Normal mood and affect.

## 2012-11-07 NOTE — Patient Instructions (Signed)
Please have blood work done today. We will call with the results.  We have also set you up for an ultrasound of your belly; this is routine to check your liver.   I have refilled the lactulose prescription. You may titrate this as needed for a goal of 3-4 bowel movements a day. We want to avoid diarrhea. If you have diarrhea, we need to cut back on this.   We will see you in 3 months!

## 2012-11-08 NOTE — Assessment & Plan Note (Signed)
Stable, due for [place] of abdomen now. Continue to titrate Lactulose to achieve at least 3 BMs daily. Add Xifaxn; unfortunately, insurance coverage has been an obstacle. Will provide samples as able and continue to try to obtain assistance. Check baseline labs, [place] of abdomen now. Return in 3 months with Dr. Jena Gauss.

## 2012-11-09 ENCOUNTER — Other Ambulatory Visit: Payer: Self-pay | Admitting: Gastroenterology

## 2012-11-09 ENCOUNTER — Ambulatory Visit (HOSPITAL_COMMUNITY)
Admission: RE | Admit: 2012-11-09 | Discharge: 2012-11-09 | Disposition: A | Discharge status: Home / Self Care | Payer: Medicare Other | Source: Ambulatory Visit | Attending: Gastroenterology | Admitting: Gastroenterology

## 2012-11-09 DIAGNOSIS — R161 Splenomegaly, not elsewhere classified: Secondary | ICD-10-CM | POA: Insufficient documentation

## 2012-11-09 DIAGNOSIS — K7460 Unspecified cirrhosis of liver: Secondary | ICD-10-CM | POA: Insufficient documentation

## 2012-11-09 LAB — CBC
HCT: 28 % — ABNORMAL LOW (ref 39.0–52.0)
Hemoglobin: 9.8 g/dL — ABNORMAL LOW (ref 13.0–17.0)
MCV: 89.2 fL (ref 78.0–100.0)
WBC: 4.9 10*3/uL (ref 4.0–10.5)

## 2012-11-09 LAB — BASIC METABOLIC PANEL
BUN: 24 mg/dL — ABNORMAL HIGH (ref 6–23)
Chloride: 108 mEq/L (ref 96–112)
Creat: 1.91 mg/dL — ABNORMAL HIGH (ref 0.50–1.35)
Glucose, Bld: 118 mg/dL — ABNORMAL HIGH (ref 70–99)
Potassium: 5 mEq/L (ref 3.5–5.3)

## 2012-11-13 NOTE — Progress Notes (Signed)
cc'd to pcp 

## 2012-11-14 NOTE — Progress Notes (Signed)
Quick Note:  PSA normal. Please send to PCP.  Hgb, Plts stable.  Cr mildly up from September. Overall, his baseline appears to be in the 1.8 to 2 range.  Let's see how he does with taking lasix every other day. He only takes 10 mg per day. If he starts having a little fluid retention, call us immediately. ______

## 2012-11-21 ENCOUNTER — Encounter: Payer: Self-pay | Admitting: Gastroenterology

## 2012-11-21 ENCOUNTER — Other Ambulatory Visit: Payer: Self-pay | Admitting: Gastroenterology

## 2012-11-21 ENCOUNTER — Other Ambulatory Visit: Payer: Self-pay

## 2012-11-21 DIAGNOSIS — K7581 Nonalcoholic steatohepatitis (NASH): Secondary | ICD-10-CM

## 2012-12-05 LAB — BASIC METABOLIC PANEL
BUN: 29 mg/dL — ABNORMAL HIGH (ref 6–23)
CO2: 25 mEq/L (ref 19–32)
Chloride: 109 mEq/L (ref 96–112)
Creat: 2.36 mg/dL — ABNORMAL HIGH (ref 0.50–1.35)
Glucose, Bld: 135 mg/dL — ABNORMAL HIGH (ref 70–99)

## 2012-12-06 NOTE — Progress Notes (Signed)
Quick Note:  Cr up to 2.36.  Stop Lasix.  Please cc to PCP. Does he have an upcoming appt with them?  He is on an ACE inhibitor. Not sure if this needs to be adjusted or not. Recommend he sees his PCP for further management as soon as possible.  ______

## 2012-12-07 ENCOUNTER — Encounter (HOSPITAL_BASED_OUTPATIENT_CLINIC_OR_DEPARTMENT_OTHER): Payer: Medicare Other

## 2012-12-07 ENCOUNTER — Encounter (HOSPITAL_COMMUNITY): Payer: Medicare Other | Attending: Oncology

## 2012-12-07 ENCOUNTER — Encounter (HOSPITAL_COMMUNITY): Payer: Self-pay

## 2012-12-07 VITALS — BP 112/58 | HR 80 | Temp 98.0°F | Resp 22 | Wt 197.0 lb

## 2012-12-07 DIAGNOSIS — D509 Iron deficiency anemia, unspecified: Secondary | ICD-10-CM

## 2012-12-07 DIAGNOSIS — D696 Thrombocytopenia, unspecified: Secondary | ICD-10-CM | POA: Insufficient documentation

## 2012-12-07 DIAGNOSIS — Z23 Encounter for immunization: Secondary | ICD-10-CM

## 2012-12-07 DIAGNOSIS — D500 Iron deficiency anemia secondary to blood loss (chronic): Secondary | ICD-10-CM

## 2012-12-07 DIAGNOSIS — D649 Anemia, unspecified: Secondary | ICD-10-CM

## 2012-12-07 DIAGNOSIS — D731 Hypersplenism: Secondary | ICD-10-CM

## 2012-12-07 DIAGNOSIS — K7460 Unspecified cirrhosis of liver: Secondary | ICD-10-CM

## 2012-12-07 LAB — CBC WITH DIFFERENTIAL/PLATELET
Eosinophils Absolute: 0.1 10*3/uL (ref 0.0–0.7)
Eosinophils Relative: 2 % (ref 0–5)
Lymphs Abs: 0.6 10*3/uL — ABNORMAL LOW (ref 0.7–4.0)
MCH: 30.5 pg (ref 26.0–34.0)
MCHC: 33.6 g/dL (ref 30.0–36.0)
MCV: 90.9 fL (ref 78.0–100.0)
Platelets: 100 10*3/uL — ABNORMAL LOW (ref 150–400)
RDW: 14.4 % (ref 11.5–15.5)

## 2012-12-07 LAB — FERRITIN: Ferritin: 90 ng/mL (ref 22–322)

## 2012-12-07 MED ORDER — INFLUENZA VAC SPLIT QUAD 0.5 ML IM SUSP
INTRAMUSCULAR | Status: AC
  Filled 2012-12-07: qty 0.5

## 2012-12-07 MED ORDER — INFLUENZA VAC SPLIT QUAD 0.5 ML IM SUSP
0.5000 mL | INTRAMUSCULAR | Status: AC
  Administered 2012-12-07: 0.5 mL via INTRAMUSCULAR

## 2012-12-07 NOTE — Patient Instructions (Signed)
[facility] Discharge Instructions  RECOMMENDATIONS MADE BY THE CONSULTANT AND ANY TEST RESULTS WILL BE SENT TO YOUR REFERRING PHYSICIAN.  EXAM FINDINGS BY THE PHYSICIAN TODAY AND SIGNS OR SYMPTOMS TO REPORT TO CLINIC OR PRIMARY PHYSICIAN: Exam and findings as discussed by Dr. Zigmund Daniel  MEDICATIONS PRESCRIBED:  Flu shot given today 12/07/12  INSTRUCTIONS/FOLLOW-UP: Return in 3 months for appointment with MD; CBC, Ferritin, BMET in 3 months.   Thank you for choosing [facility] to provide your oncology and hematology care.  To afford each patient quality time with our providers, please arrive at least 15 minutes before your scheduled appointment time.  With your help, our goal is to use those 15 minutes to complete the necessary work-up to ensure our physicians have the information they need to help with your evaluation and healthcare recommendations.    Effective January 1st, 2014, we ask that you re-schedule your appointment with our physicians should you arrive 10 or more minutes late for your appointment.  We strive to give you quality time with our providers, and arriving late affects you and other patients whose appointments are after yours.    Again, thank you for choosing [facility].  Our hope is that these requests will decrease the amount of time that you wait before being seen by our physicians.       _____________________________________________________________  Should you have questions after your visit to [facility], please contact our office at [phone] between the hours of 8:30 a.m. and 5:00 p.m.  Voicemails left after 4:30 p.m. will not be returned until the following business day.  For prescription refill requests, have your pharmacy contact our office with your prescription refill request.

## 2012-12-07 NOTE — Progress Notes (Signed)
Labs drawn toady for cbc/diff,ferr

## 2012-12-07 NOTE — Progress Notes (Signed)
[facility]  OFFICE PROGRESS NOTE  Kirk Ruths, MD [address] [place] [address]  DIAGNOSIS: Cirrhosis of liver without mention of alcohol - Plan: CANCELED: Lactate dehydrogenase  Hypersplenism  Iron deficiency anemia - Plan: Ferritin  Chief Complaint  Patient presents with  . 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  INTERVAL HISTORY: [name] 67 y.o. 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 Last Feraheme injection was in July of 2013.  He continues to use a walker at home for chronic back pain. Easy satiety is present without nausea, vomiting, diarrhea, constipation, melena, hematochezia, hematuria, easy bruising, lower extremity swelling or redness, pruritus, PND, orthopnea, palpitations, headache, or seizures. Urine is normal color with brown stools.  MEDICAL HISTORY: Past Medical History  Diagnosis Date  . Hypertension     negative stress nuclear study in 02/2007; normal coronaries in 2006; Echo in 2006-aortic sclerosis, Mild biatrial enlargement; normal LV  . Pulmonary hypertension     Mild pulmonary hypertension with normal PVR by 2010 cath [facility])  . Hyperlipidemia   . Syncope 2008    orthostatic hypotension  . Diabetes mellitus     insulin; onset in 1999; retinopathy, neuropathy and nephropathy  . Chronic atrial fibrillation     onset in 1995; anticoagulation discontinued in 2006  . Crohn's disease     -lower GI bleed in 08/2002;  Pentasa stopped by Dr. Brooke Dare at [facility] ; clinical remission.  . Obesity   . NASH (nonalcoholic steatohepatitis)     cirrhosis with esophageal varices; h/o GI bleeding. EGD-2010 grade 2 esophageal varices-banded with good hemostasis;  portal gastropathy, friable gastric mucosa; hiatal hernia.  EGD- recurrent melena one week later showed 2 eschars at previously placed bands, decompressed esophageal varices grade 1-2,four columns, no bleeding stigmata, normal appearing small bowel, pt has had 1 Hep A/B vaccine.    . Esophageal varices     s/p band ligation multiple times, Portal Hypertensive gastropathy;  . S/P TIPS (transjugular intrahepatic portosystemic shunt)     06/2009-TIPS procedure at [facility]  . GI bleed     07/2009-admitted with recurrent upper GI bleed secondary to esophageal varices-banded x3  . Pleural effusion, left     s/p thoracentesis-multiple  . Depression   . Anemia     Iron def requiring parenteral iron or injections in past, followed by Dr. Glenford Peers; B12 def  . Duodenal ulcer 1980s  . Ascites   . Hiatal hernia 12/02/2009  . Esophageal erosions     consistent with mild erosive reflux esophagitis  . Portal hypertensive gastropathy   . 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 Jena Gauss  . Cancer 10/2011  . Squamous cell carcinoma of face   . Pneumonia     long time ago  . Shortness of breath   . GERD (gastroesophageal reflux disease)   . Arthritis     knees & ankles   . S/P radiation therapy 01/07/12 thru 03/21/2012    Dr. Thersa Salt; Right cheek/parotid and neck nodes  . Chronic kidney disease     stage III; creatinine of 1.6 in 8/07, 1.35 and 05/2008  . Nephrolithiasis     cystoscopy with stone extraction in 1990,   .  Morbid obesity 09/20/2012  . Coagulopathy 09/20/2012    INTERIM HISTORY: has HYPERLIPIDEMIA; UMBILICAL HERNIA; Cirrhosis of liver without mention of alcohol; Hypertension; Pulmonary hypertension; Diabetes mellitus; Chronic atrial fibrillation; Chronic kidney disease; NASH (nonalcoholic steatohepatitis); Esophageal varices; S/P TIPS (transjugular intrahepatic portosystemic shunt); Anemia; Epigastric pain; Heme positive stool; Edema of left lower extremity; Iron deficiency anemia;  B12 deficiency anemia; Upper GI bleed; Melena; Blood loss anemia; Thrombocytopenia; Chronic diarrhea; Abdominal pain; Chronic periodontitis; Carotid bruit; Diabetes; Dehydration; Orthostatic hypotension; Hypotension, unspecified; Acute renal failure; Hypovolemia; Generalized weakness; Morbid obesity; Coagulopathy; and Urinary hesitancy on his problem list.    ALLERGIES:  is allergic to aspirin; codeine; tricode ar; other; and tape.  MEDICATIONS: has a current medication list which includes the following prescription(s): alprazolam, digox, furosemide, furosemide, gabapentin, insulin aspart, insulin glargine, lactulose, lisinopril, lisinopril, mirtazapine, ondansetron, pantoprazole, propranolol, rifaximin, rifaximin, simvastatin, trazodone, mometasone, and ondansetron.  SURGICAL HISTORY:  Past Surgical History  Procedure Laterality Date  . Tips procedure  09/16/09  . 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 Cholecystectomy    . Circumcision  -1992  . Colonoscopy  -2008    Reportedly normal, done in [place]  . Multiple thoracenteses    . Esophagogastroduodenoscopy  12/02/2009    distal esophageal erosions, small hiatal hernia  . Tips procedure  2011  . Colonoscopy  01/26/2011    RMR: adequate preparation.  normal rectum. Somewhat  diffusely friable colonic mucosa. A single 4 mm polyp in the ascending segment. couple of mucosal breaks in the distal 10 cm of terminal ileum with  a couple of erosions at the junction  the TI  and colon.. Tubular adenoma, chronic ileitis. Next TCS 01/2016.  Marland Kitchen Esophagogastroduodenoscopy  01/26/2011    RMR: Decompressed/abscent esophagel varices s/p TIPS. small hh. GAVE. Procedure: ESOPHAGOGASTRODUODENOSCOPY (EGD);  Surgeon: Corbin Ade, MD;  Location: [facility];  Service: Endoscopy;  Laterality: N/A;  . Esophagogastroduodenoscopy  04/22/2011    RMR: GAVE.-status post APC ablation as described above   . Esophagogastroduodenoscopy  05/20/2011    RMR: Portal gastropathy.  GAVE - status post APC ablation  . Hot hemostasis  05/20/2011    Procedure: HOT HEMOSTASIS (ARGON PLASMA COAGULATION/BICAP);  Surgeon: Corbin Ade, MD;  Location: [facility];  Service: Endoscopy;  Laterality: N/A;  . Esophagogastroduodenoscopy  07/14/2005    Persisting two columns of grade 2 esophageal varices with one cherry red spot on one of these columns distally.  Otherwise normal  . Skin cancer excision  Oct 2013    large lesion rt side of face  . Cardiac catheterization       04/12/2008 [facility]): no significant CAD, mild pulmonary hypertension with normal PVR, normal pulmonary capillary wedge pressure, normal left ventricular end-diastolic pressure.  . Multiple extractions with alveoloplasty  01/14/2012    Procedure: MULTIPLE EXTRACION WITH ALVEOLOPLASTY;  Surgeon: Charlynne Pander, DDS;  Location: [facility] OR;  Service: Oral Surgery;  Laterality: N/A;  Extraction of tooth #'s [id number] with alveoloplasty and gross debridement of remaining teeth    FAMILY HISTORY: family history is negative for Colon cancer, Inflammatory bowel disease, Liver disease, Anesthesia problems, Hypotension, Malignant hyperthermia, and Pseudochol deficiency.  SOCIAL HISTORY:  reports that he has never smoked. He has never used smokeless tobacco. He reports that he does not drink alcohol or use illicit drugs.  REVIEW OF SYSTEMS:  Other than that discussed above is noncontributory.  PHYSICAL EXAMINATION: ECOG PERFORMANCE  STATUS: 2 - Symptomatic, <50% confined to bed  Blood pressure 112/58, pulse 80, temperature 98 F (36.7 C), temperature source Oral, resp. rate 22, weight 197 lb (89.359 kg).  GENERAL:alert, no distress and comfortable SKIN: skin color, texture, turgor are normal, no rashes or significant lesions EYES: PERLA; Conjunctiva are pink and non-injected, sclera clear OROPHARYNX:no exudate, no erythema on lips, buccal mucosa, or  tongue. NECK: supple, thyroid normal size, non-tender, without nodularity. No masses CHEST: Increased AP diameter with no evidence of spider angiomata. LYMPH:  no palpable lymphadenopathy in the cervical, axillary or inguinal LUNGS: clear to auscultation and percussion with normal breathing effort HEART: regular rate & rhythm and no murmurs. ABDOMEN:abdomen soft, non-tender and normal bowel sounds. Spleen tip palpable. No free fluid wave or shifting dullness. MUSCULOSKELETAL:no cyanosis of digits and no clubbing. Range of motion normal.  NEURO: alert & oriented x 3 with fluent speech, no focal motor/sensory deficits. No evidence of asterixis.   LABORATORY DATA: Infusion on 12/07/2012  Component Date Value Range Status  . WBC 12/07/2012 4.5  4.0 - 10.5 K/uL Final  . RBC 12/07/2012 3.31* 4.22 - 5.81 MIL/uL Final  . Hemoglobin 12/07/2012 10.1* 13.0 - 17.0 g/dL Final  . HCT 16/10/9602 30.1* 39.0 - 52.0 % Final  . MCV 12/07/2012 90.9  78.0 - 100.0 fL Final  . MCH 12/07/2012 30.5  26.0 - 34.0 pg Final  . MCHC 12/07/2012 33.6  30.0 - 36.0 g/dL Final  . RDW 54/09/8117 14.4  11.5 - 15.5 % Final  . Platelets 12/07/2012 100* 150 - 400 K/uL Final   CONSISTENT WITH PREVIOUS RESULT  . Neutrophils Relative % 12/07/2012 73  43 - 77 % Final  . Neutro Abs 12/07/2012 3.3  1.7 - 7.7 K/uL Final  . Lymphocytes Relative 12/07/2012 12  12 - 46 % Final  . Lymphs Abs 12/07/2012 0.6* 0.7 - 4.0 K/uL Final  . Monocytes Relative 12/07/2012 12  3 - 12 % Final  . Monocytes Absolute 12/07/2012 0.6  0.1 - 1.0 K/uL Final  . Eosinophils Relative 12/07/2012 2  0 - 5 % Final  . Eosinophils Absolute 12/07/2012 0.1  0.0 - 0.7 K/uL Final  . Basophils Relative 12/07/2012 0  0 - 1 % Final  . Basophils Absolute 12/07/2012 0.0  0.0 - 0.1 K/uL Final  Orders Only on 11/21/2012  Component Date Value Range Status  . Sodium 12/04/2012 139  135 - 145 mEq/L Final  . Potassium 12/04/2012 5.1  3.5 - 5.3 mEq/L Final  . Chloride  12/04/2012 109  96 - 112 mEq/L Final  . CO2 12/04/2012 25  19 - 32 mEq/L Final  . Glucose, Bld 12/04/2012 135* 70 - 99 mg/dL Final  . BUN 14/78/2956 29* 6 - 23 mg/dL Final  . Creat 21/30/8657 2.36* 0.50 - 1.35 mg/dL Final  . Calcium 84/69/6295 8.8  8.4 - 10.5 mg/dL Final  Orders Only on 28/41/3244  Component Date Value Range Status  . Magnesium 11/09/2012 1.7  1.5 - 2.5 mg/dL Final    PATHOLOGY: No new pathology.  Urinalysis    Component Value Date/Time   COLORURINE YELLOW 09/19/2012 1455   APPEARANCEUR CLEAR 09/19/2012 1455   LABSPEC >1.030* 09/19/2012 1455   PHURINE 5.5 09/19/2012 1455   GLUCOSEU 250* 09/19/2012 1455   HGBUR LARGE* 09/19/2012 1455   BILIRUBINUR NEGATIVE 09/19/2012 1455   KETONESUR NEGATIVE 09/19/2012 1455   PROTEINUR 30* 09/19/2012 1455   UROBILINOGEN 1.0 09/19/2012 1455   NITRITE NEGATIVE 09/19/2012 1455  LEUKOCYTESUR NEGATIVE 09/19/2012 1455    RADIOGRAPHIC STUDIES: US Abdomen Complete  11/09/2012   CLINICAL DATA:  Cirrhosis, hepatoma surveillance  EXAM: ULTRASOUND ABDOMEN COMPLETE  COMPARISON:  CT abdomen and pelvis of 03/04/2008  FINDINGS: Gallbladder  The gallbladder has previously been resected. No pain is present over the right upper quadrant.  Common bile duct  Diameter: The common bile duct is normal measuring 5.4 mm in diameter.  Liver  The liver is echogenic consistent with fatty infiltration and a contours are are somewhat nodular consistent with changes of cirrhosis. No focal hepatic lesion is seen. The TIPS does appear to be patent.  IVC  No abnormality visualized.  Pancreas  The pancreas is obscured by bowel gas.  Spleen  The spleen is enlarged measuring 15.8 cm sagittally with a volume of 1153 cubic cm.  Right Kidney  Length: The right kidney measures 10.6 cm sagittally. No hydronephrosis is seen.  Left Kidney  Length: The left kidney measures 12.5 cm. No hydronephrosis is noted.  Abdominal aorta  The abdominal aorta is normal caliber.  IMPRESSION: 1. Echogenic  irregular liver consistent with cirrhosis. No focal hepatic lesion. 2. Splenomegaly. 3. The TIPS appears patent.   Electronically Signed   By: Dwyane Dee M.D.   On: 11/09/2012 08:43    ASSESSMENT:  #1.Multifactorial anemia which is very stable. He has both cirrhosis of the liver, GI blood losses, this resulted in iron deficiency, and he has an anemia of chronic disease picture as well. As long as we replace his iron he keeps his hemoglobin in the 11-12 g range typically. Last ferritin infusion in July 2013. #2.NASH, s/p TIPS with recent US May 2014 at [facility] noting patency and negative for [facility]   PLAN:  #1. Influenza virus vaccine to be given today. #2. Call if increasing weakness occurs or any bleeding occurs. #3. Followup in 3 months with CBC, ferritin.   All questions were answered. The patient knows to call the clinic with any problems, questions or concerns. We can certainly see the patient much sooner if necessary.   I spent 25 minutes counseling the patient face to face. The total time spent in the appointment was 30 minutes.    Maurilio Lovely, MD 12/07/2012 11:45 AM

## 2012-12-08 ENCOUNTER — Telehealth (INDEPENDENT_AMBULATORY_CARE_PROVIDER_SITE_OTHER): Payer: Self-pay | Admitting: *Deleted

## 2012-12-08 NOTE — Telephone Encounter (Signed)
Patient returned your call and would like for you to call him back on Monday

## 2012-12-08 NOTE — Telephone Encounter (Signed)
Tried to call pt- LMOM 

## 2012-12-11 ENCOUNTER — Telehealth: Payer: Self-pay | Admitting: Cardiology

## 2012-12-11 MED ORDER — DIGOXIN 125 MCG PO TABS: 0.1250 mg | ORAL_TABLET | Freq: Every day | ORAL | Status: DC

## 2012-12-11 NOTE — Telephone Encounter (Signed)
rx sent to pharmacy by e-script  

## 2012-12-11 NOTE — Telephone Encounter (Signed)
Spoke with pt

## 2012-12-11 NOTE — Telephone Encounter (Signed)
Received fax refill request  Rx # [id number] Medication:  Digoxin 125 mcg tablet Qty 30 Sig:  Take one tablet by mouth daily Physician:  Diona Browner

## 2013-01-07 ENCOUNTER — Inpatient Hospital Stay (HOSPITAL_COMMUNITY)
Admission: EM | Admit: 2013-01-07 | Discharge: 2013-01-21 | DRG: 393 | Disposition: A | Discharge status: Other Acute Inpatient Hospital | Payer: Medicare Other | Attending: Internal Medicine | Admitting: Internal Medicine

## 2013-01-07 ENCOUNTER — Emergency Department (HOSPITAL_COMMUNITY): Payer: Medicare Other

## 2013-01-07 ENCOUNTER — Encounter (HOSPITAL_COMMUNITY): Payer: Self-pay | Admitting: Emergency Medicine

## 2013-01-07 DIAGNOSIS — K219 Gastro-esophageal reflux disease without esophagitis: Secondary | ICD-10-CM | POA: Diagnosis present

## 2013-01-07 DIAGNOSIS — E1139 Type 2 diabetes mellitus with other diabetic ophthalmic complication: Secondary | ICD-10-CM | POA: Diagnosis present

## 2013-01-07 DIAGNOSIS — D696 Thrombocytopenia, unspecified: Secondary | ICD-10-CM

## 2013-01-07 DIAGNOSIS — N189 Chronic kidney disease, unspecified: Secondary | ICD-10-CM

## 2013-01-07 DIAGNOSIS — R188 Other ascites: Secondary | ICD-10-CM | POA: Diagnosis present

## 2013-01-07 DIAGNOSIS — M129 Arthropathy, unspecified: Secondary | ICD-10-CM | POA: Diagnosis present

## 2013-01-07 DIAGNOSIS — Z923 Personal history of irradiation: Secondary | ICD-10-CM

## 2013-01-07 DIAGNOSIS — D500 Iron deficiency anemia secondary to blood loss (chronic): Secondary | ICD-10-CM | POA: Diagnosis present

## 2013-01-07 DIAGNOSIS — K7689 Other specified diseases of liver: Secondary | ICD-10-CM | POA: Diagnosis present

## 2013-01-07 DIAGNOSIS — E860 Dehydration: Secondary | ICD-10-CM | POA: Diagnosis present

## 2013-01-07 DIAGNOSIS — K7460 Unspecified cirrhosis of liver: Secondary | ICD-10-CM | POA: Diagnosis present

## 2013-01-07 DIAGNOSIS — D638 Anemia in other chronic diseases classified elsewhere: Secondary | ICD-10-CM | POA: Diagnosis present

## 2013-01-07 DIAGNOSIS — E1129 Type 2 diabetes mellitus with other diabetic kidney complication: Secondary | ICD-10-CM | POA: Diagnosis present

## 2013-01-07 DIAGNOSIS — D649 Anemia, unspecified: Secondary | ICD-10-CM | POA: Diagnosis present

## 2013-01-07 DIAGNOSIS — E119 Type 2 diabetes mellitus without complications: Secondary | ICD-10-CM

## 2013-01-07 DIAGNOSIS — K4090 Unilateral inguinal hernia, without obstruction or gangrene, not specified as recurrent: Secondary | ICD-10-CM

## 2013-01-07 DIAGNOSIS — I959 Hypotension, unspecified: Secondary | ICD-10-CM | POA: Diagnosis present

## 2013-01-07 DIAGNOSIS — E440 Moderate protein-calorie malnutrition: Secondary | ICD-10-CM | POA: Diagnosis present

## 2013-01-07 DIAGNOSIS — I272 Pulmonary hypertension, unspecified: Secondary | ICD-10-CM

## 2013-01-07 DIAGNOSIS — I482 Chronic atrial fibrillation, unspecified: Secondary | ICD-10-CM

## 2013-01-07 DIAGNOSIS — N170 Acute kidney failure with tubular necrosis: Secondary | ICD-10-CM | POA: Diagnosis present

## 2013-01-07 DIAGNOSIS — Z87442 Personal history of urinary calculi: Secondary | ICD-10-CM

## 2013-01-07 DIAGNOSIS — Z8249 Family history of ischemic heart disease and other diseases of the circulatory system: Secondary | ICD-10-CM

## 2013-01-07 DIAGNOSIS — E870 Hyperosmolality and hypernatremia: Secondary | ICD-10-CM | POA: Diagnosis present

## 2013-01-07 DIAGNOSIS — R109 Unspecified abdominal pain: Secondary | ICD-10-CM

## 2013-01-07 DIAGNOSIS — K56609 Unspecified intestinal obstruction, unspecified as to partial versus complete obstruction: Secondary | ICD-10-CM | POA: Diagnosis present

## 2013-01-07 DIAGNOSIS — E11319 Type 2 diabetes mellitus with unspecified diabetic retinopathy without macular edema: Secondary | ICD-10-CM | POA: Diagnosis present

## 2013-01-07 DIAGNOSIS — I129 Hypertensive chronic kidney disease with stage 1 through stage 4 chronic kidney disease, or unspecified chronic kidney disease: Secondary | ICD-10-CM | POA: Diagnosis present

## 2013-01-07 DIAGNOSIS — K4030 Unilateral inguinal hernia, with obstruction, without gangrene, not specified as recurrent: Principal | ICD-10-CM | POA: Diagnosis present

## 2013-01-07 DIAGNOSIS — E1142 Type 2 diabetes mellitus with diabetic polyneuropathy: Secondary | ICD-10-CM | POA: Diagnosis present

## 2013-01-07 DIAGNOSIS — Z860100 Personal history of colon polyps, unspecified: Secondary | ICD-10-CM

## 2013-01-07 DIAGNOSIS — Z7682 Awaiting organ transplant status: Secondary | ICD-10-CM

## 2013-01-07 DIAGNOSIS — E876 Hypokalemia: Secondary | ICD-10-CM | POA: Diagnosis present

## 2013-01-07 DIAGNOSIS — D72829 Elevated white blood cell count, unspecified: Secondary | ICD-10-CM | POA: Diagnosis present

## 2013-01-07 DIAGNOSIS — E1149 Type 2 diabetes mellitus with other diabetic neurological complication: Secondary | ICD-10-CM | POA: Diagnosis present

## 2013-01-07 DIAGNOSIS — E872 Acidosis, unspecified: Secondary | ICD-10-CM | POA: Diagnosis not present

## 2013-01-07 DIAGNOSIS — I2789 Other specified pulmonary heart diseases: Secondary | ICD-10-CM | POA: Diagnosis present

## 2013-01-07 DIAGNOSIS — N183 Chronic kidney disease, stage 3 unspecified: Secondary | ICD-10-CM | POA: Diagnosis present

## 2013-01-07 DIAGNOSIS — K7581 Nonalcoholic steatohepatitis (NASH): Secondary | ICD-10-CM

## 2013-01-07 DIAGNOSIS — D6959 Other secondary thrombocytopenia: Secondary | ICD-10-CM | POA: Diagnosis present

## 2013-01-07 DIAGNOSIS — N179 Acute kidney failure, unspecified: Secondary | ICD-10-CM

## 2013-01-07 DIAGNOSIS — I4891 Unspecified atrial fibrillation: Secondary | ICD-10-CM | POA: Diagnosis present

## 2013-01-07 DIAGNOSIS — N058 Unspecified nephritic syndrome with other morphologic changes: Secondary | ICD-10-CM | POA: Diagnosis present

## 2013-01-07 DIAGNOSIS — E785 Hyperlipidemia, unspecified: Secondary | ICD-10-CM | POA: Diagnosis present

## 2013-01-07 DIAGNOSIS — Z981 Arthrodesis status: Secondary | ICD-10-CM

## 2013-01-07 DIAGNOSIS — Z833 Family history of diabetes mellitus: Secondary | ICD-10-CM

## 2013-01-07 LAB — COMPREHENSIVE METABOLIC PANEL
ALK PHOS: 123 U/L — AB (ref 39–117)
ALT: 8 U/L (ref 0–53)
AST: 16 U/L (ref 0–37)
Albumin: 3.1 g/dL — ABNORMAL LOW (ref 3.5–5.2)
BILIRUBIN TOTAL: 2 mg/dL — AB (ref 0.3–1.2)
BUN: 37 mg/dL — ABNORMAL HIGH (ref 6–23)
CO2: 22 mEq/L (ref 19–32)
Calcium: 9.2 mg/dL (ref 8.4–10.5)
Chloride: 103 mEq/L (ref 96–112)
Creatinine, Ser: 3.03 mg/dL — ABNORMAL HIGH (ref 0.50–1.35)
GFR calc non Af Amer: 20 mL/min — ABNORMAL LOW (ref >90)
GFR, EST AFRICAN AMERICAN: 23 mL/min — AB (ref >90)
GLUCOSE: 190 mg/dL — AB (ref 70–99)
POTASSIUM: 4.5 meq/L (ref 3.7–5.3)
Sodium: 141 mEq/L (ref 137–147)
TOTAL PROTEIN: 8.1 g/dL (ref 6.0–8.3)

## 2013-01-07 LAB — PROTIME-INR
INR: 1.34 (ref 0.00–1.49)
PROTHROMBIN TIME: 16.3 s — AB (ref 11.6–15.2)

## 2013-01-07 LAB — CBC WITH DIFFERENTIAL/PLATELET
BASOS ABS: 0 10*3/uL (ref 0.0–0.1)
Basophils Relative: 0 % (ref 0–1)
EOS PCT: 1 % (ref 0–5)
Eosinophils Absolute: 0.1 10*3/uL (ref 0.0–0.7)
HCT: 33 % — ABNORMAL LOW (ref 39.0–52.0)
Hemoglobin: 11.1 g/dL — ABNORMAL LOW (ref 13.0–17.0)
LYMPHS PCT: 4 % — AB (ref 12–46)
Lymphs Abs: 0.5 10*3/uL — ABNORMAL LOW (ref 0.7–4.0)
MCH: 30.5 pg (ref 26.0–34.0)
MCHC: 33.6 g/dL (ref 30.0–36.0)
MCV: 90.7 fL (ref 78.0–100.0)
Monocytes Absolute: 1 10*3/uL (ref 0.1–1.0)
Monocytes Relative: 8 % (ref 3–12)
Neutro Abs: 11.2 10*3/uL — ABNORMAL HIGH (ref 1.7–7.7)
Neutrophils Relative %: 88 % — ABNORMAL HIGH (ref 43–77)
PLATELETS: 127 10*3/uL — AB (ref 150–400)
RBC: 3.64 MIL/uL — ABNORMAL LOW (ref 4.22–5.81)
RDW: 14.6 % (ref 11.5–15.5)
WBC: 12.8 10*3/uL — AB (ref 4.0–10.5)

## 2013-01-07 LAB — LACTIC ACID, PLASMA: Lactic Acid, Venous: 2.9 mmol/L — ABNORMAL HIGH (ref 0.5–2.2)

## 2013-01-07 LAB — AMMONIA: Ammonia: 43 umol/L (ref 11–60)

## 2013-01-07 LAB — LIPASE, BLOOD: LIPASE: 26 U/L (ref 11–59)

## 2013-01-07 LAB — GLUCOSE, CAPILLARY: GLUCOSE-CAPILLARY: 153 mg/dL — AB (ref 70–99)

## 2013-01-07 MED ORDER — ACETAMINOPHEN 650 MG RE SUPP: 650.0000 mg | Freq: Four times a day (QID) | RECTAL | Status: DC | PRN

## 2013-01-07 MED ORDER — ACETAMINOPHEN 325 MG PO TABS
650.0000 mg | ORAL_TABLET | Freq: Four times a day (QID) | ORAL | Status: DC | PRN
  Administered 2013-01-13: 650 mg via ORAL
  Filled 2013-01-07: qty 2

## 2013-01-07 MED ORDER — IOHEXOL 300 MG/ML  SOLN
50.0000 mL | Freq: Once | INTRAMUSCULAR | Status: AC | PRN
  Administered 2013-01-07: 50 mL via ORAL

## 2013-01-07 MED ORDER — SODIUM CHLORIDE 0.9 % IJ SOLN
3.0000 mL | Freq: Two times a day (BID) | INTRAMUSCULAR | Status: DC
  Administered 2013-01-08 – 2013-01-21 (×12): 3 mL via INTRAVENOUS

## 2013-01-07 MED ORDER — PANTOPRAZOLE SODIUM 40 MG IV SOLR
40.0000 mg | Freq: Two times a day (BID) | INTRAVENOUS | Status: DC
  Administered 2013-01-07 – 2013-01-12 (×11): 40 mg via INTRAVENOUS
  Filled 2013-01-07 (×11): qty 40

## 2013-01-07 MED ORDER — SODIUM CHLORIDE 0.9 % IV SOLN
INTRAVENOUS | Status: DC
  Administered 2013-01-07: 11:00:00 via INTRAVENOUS

## 2013-01-07 MED ORDER — INSULIN ASPART 100 UNIT/ML ~~LOC~~ SOLN
0.0000 [IU] | SUBCUTANEOUS | Status: DC
  Administered 2013-01-07 – 2013-01-08 (×4): 3 [IU] via SUBCUTANEOUS
  Administered 2013-01-09 (×3): 2 [IU] via SUBCUTANEOUS
  Administered 2013-01-09 – 2013-01-10 (×2): 3 [IU] via SUBCUTANEOUS
  Administered 2013-01-10 – 2013-01-11 (×4): 2 [IU] via SUBCUTANEOUS
  Administered 2013-01-11: 3 [IU] via SUBCUTANEOUS
  Administered 2013-01-12: 2 [IU] via SUBCUTANEOUS
  Administered 2013-01-12 (×2): 3 [IU] via SUBCUTANEOUS
  Administered 2013-01-12 (×2): 2 [IU] via SUBCUTANEOUS
  Administered 2013-01-12: 3 [IU] via SUBCUTANEOUS
  Administered 2013-01-13: 2 [IU] via SUBCUTANEOUS
  Administered 2013-01-13: 3 [IU] via SUBCUTANEOUS
  Administered 2013-01-13 (×2): 2 [IU] via SUBCUTANEOUS
  Administered 2013-01-13: 3 [IU] via SUBCUTANEOUS
  Administered 2013-01-14 – 2013-01-16 (×9): 2 [IU] via SUBCUTANEOUS
  Administered 2013-01-16: 3 [IU] via SUBCUTANEOUS
  Administered 2013-01-16: 2 [IU] via SUBCUTANEOUS
  Administered 2013-01-17: 3 [IU] via SUBCUTANEOUS
  Administered 2013-01-17: 2 [IU] via SUBCUTANEOUS

## 2013-01-07 MED ORDER — MORPHINE SULFATE 2 MG/ML IJ SOLN
1.0000 mg | INTRAMUSCULAR | Status: DC | PRN
  Administered 2013-01-07 – 2013-01-20 (×16): 1 mg via INTRAVENOUS
  Filled 2013-01-07 (×16): qty 1

## 2013-01-07 MED ORDER — MORPHINE SULFATE 4 MG/ML IJ SOLN
4.0000 mg | Freq: Once | INTRAMUSCULAR | Status: AC
  Administered 2013-01-07: 4 mg via INTRAVENOUS
  Filled 2013-01-07: qty 1

## 2013-01-07 MED ORDER — ONDANSETRON HCL 4 MG PO TABS: 4.0000 mg | ORAL_TABLET | Freq: Four times a day (QID) | ORAL | Status: DC | PRN

## 2013-01-07 MED ORDER — ONDANSETRON HCL 4 MG/2ML IJ SOLN
4.0000 mg | Freq: Once | INTRAMUSCULAR | Status: AC
  Administered 2013-01-07: 4 mg via INTRAVENOUS
  Filled 2013-01-07: qty 2

## 2013-01-07 MED ORDER — PROPOFOL 10 MG/ML IV BOLUS
Freq: Once | INTRAVENOUS | Status: AC
  Administered 2013-01-07: 44.7 mg via INTRAVENOUS
  Filled 2013-01-07: qty 1

## 2013-01-07 MED ORDER — SODIUM CHLORIDE 0.9 % IV SOLN
INTRAVENOUS | Status: DC
  Administered 2013-01-07 – 2013-01-10 (×5): via INTRAVENOUS
  Administered 2013-01-11 – 2013-01-12 (×2): 1000 mL via INTRAVENOUS

## 2013-01-07 MED ORDER — ONDANSETRON HCL 4 MG/2ML IJ SOLN
4.0000 mg | Freq: Once | INTRAMUSCULAR | Status: AC
Start: 2013-01-07 — End: 2013-01-07
  Administered 2013-01-07: 4 mg via INTRAVENOUS
  Filled 2013-01-07: qty 2

## 2013-01-07 MED ORDER — HEPARIN SODIUM (PORCINE) 5000 UNIT/ML IJ SOLN
5000.0000 [IU] | Freq: Three times a day (TID) | INTRAMUSCULAR | Status: DC
  Administered 2013-01-07 – 2013-01-08 (×3): 5000 [IU] via SUBCUTANEOUS
  Filled 2013-01-07 (×3): qty 1

## 2013-01-07 MED ORDER — ONDANSETRON HCL 4 MG/2ML IJ SOLN
4.0000 mg | Freq: Four times a day (QID) | INTRAMUSCULAR | Status: DC | PRN
  Administered 2013-01-07 – 2013-01-14 (×17): 4 mg via INTRAVENOUS
  Filled 2013-01-07 (×17): qty 2

## 2013-01-07 MED ORDER — PROPOFOL 10 MG/ML IV BOLUS
INTRAVENOUS | Status: AC | PRN
  Administered 2013-01-07: 44.7 mg via INTRAVENOUS

## 2013-01-07 MED ORDER — SODIUM CHLORIDE 0.9 % IV SOLN: INTRAVENOUS | Status: DC

## 2013-01-07 NOTE — ED Notes (Signed)
Abby Rn aware pt changed from med surg to telemetry

## 2013-01-07 NOTE — ED Notes (Signed)
Patient unable to provide urine specimen at this time.

## 2013-01-07 NOTE — H&P (Signed)
[facility] History and Physical  [name] FTD:[id number] DOB: September 26, 1945 DOA: 01/07/2013  Referring physician: Dr. Betsey Holiday, ER physician PCP: Leonides Grills, MD   Chief Complaint: Abdominal pain  HPI: [name] is a 68 y.o. male with a history of multiple medical problems, presents the emergency room with onset of abdominal pain. Patient reports that his right lower abdominal pain began yesterday evening at around 8 PM. This was associated with persistent vomiting. He's had approximately 5-6 episodes of vomiting since then. He's not been able to keep any food down. His vomitus is nonbloody. He reports one bowel movement before yesterday which was noted to be hard. He was able to pass flatus yesterday but has not moved any since then. He denies any fever, shortness of breath, cough, chest pain. He does not have any history of bowel obstructions in the past. He was noted to have a right inguinal hernia in the emergency room. Patient unaware of how long it has been there. Case was discussed with surgery and the hernia was reduced in the emergency room. CT scan of the abdomen and pelvis indicated a small bowel obstruction. Patient will be admitted for further treatment.   Review of Systems: Pertinent positives as per history of present illness, otherwise negative  Past Medical History  Diagnosis Date  . Hypertension     negative stress nuclear study in 02/2007; normal coronaries in 2006; Echo in 2006-aortic sclerosis, Mild biatrial enlargement; normal LV  . Pulmonary hypertension     Mild pulmonary hypertension with normal PVR by 2010 cath [facility])  . Hyperlipidemia   . Syncope 2008    orthostatic hypotension  . Diabetes mellitus     insulin; onset in 1999; retinopathy, neuropathy and nephropathy  . Chronic atrial fibrillation     onset in 1995; anticoagulation discontinued in 2006  . Crohn's disease     -lower GI bleed in 08/2002;  Pentasa stopped by Dr. Marty Heck at [facility] ;  clinical remission.  . Obesity   . NASH (nonalcoholic steatohepatitis)     cirrhosis with esophageal varices; h/o GI bleeding. EGD-2010 grade 2 esophageal varices-banded with good hemostasis; portal gastropathy, friable gastric mucosa; hiatal hernia.  EGD- recurrent melena one week later showed 2 eschars at previously placed bands, decompressed esophageal varices grade 1-2,four columns, no bleeding stigmata, normal appearing small bowel, pt has had 1 Hep A/B vaccine.    . Esophageal varices     s/p band ligation multiple times, Portal Hypertensive gastropathy;  . S/P TIPS (transjugular intrahepatic portosystemic shunt)     06/2009-TIPS procedure at [facility]  . GI bleed     07/2009-admitted with recurrent upper GI bleed secondary to esophageal varices-banded x3  . Pleural effusion, left     s/p thoracentesis-multiple  . Depression   . Anemia     Iron def requiring parenteral iron or injections in past, followed by Dr. Everardo All; B12 def  . Duodenal ulcer 1980s  . Ascites   . Hiatal hernia 12/02/2009  . Esophageal erosions     consistent with mild erosive reflux esophagitis  . Portal hypertensive gastropathy   . 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 Gala Romney  . Cancer 10/2011  . Squamous cell carcinoma of face   . Pneumonia     long time ago  . Shortness of breath   . GERD (gastroesophageal reflux disease)   . Arthritis     knees & ankles   . S/P radiation therapy  01/07/12 thru 03/21/2012    Dr. Orlene Erm; Right cheek/parotid and neck nodes  . Chronic kidney disease     stage III; creatinine of 1.6 in 8/07, 1.35 and 05/2008  . Nephrolithiasis     cystoscopy with stone extraction in 1990,   . Morbid obesity 09/20/2012  . Coagulopathy 09/20/2012   Past Surgical History  Procedure Laterality Date  . Tips procedure  09/16/09  . 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 Cholecystectomy    . Circumcision  -1992  .  Colonoscopy  -2008    Reportedly normal, done in [place]  . Multiple thoracenteses    . Esophagogastroduodenoscopy  12/02/2009    distal esophageal erosions, small hiatal hernia  . Tips procedure  2011  . Colonoscopy  01/26/2011    RMR: adequate preparation.  normal rectum. Somewhat  diffusely friable colonic mucosa. A single 4 mm polyp in the ascending segment. couple of mucosal breaks in the distal 10 cm of terminal ileum with  a couple of erosions at the junction  the TI  and colon.. Tubular adenoma, chronic ileitis. Next TCS 01/2016.  Marland Kitchen Esophagogastroduodenoscopy  01/26/2011    RMR: Decompressed/abscent esophagel varices s/p TIPS. small hh. GAVE. Procedure: ESOPHAGOGASTRODUODENOSCOPY (EGD);  Surgeon: Daneil Dolin, MD;  [facility];  Service: Endoscopy;  Laterality: N/A;  . Esophagogastroduodenoscopy  04/22/2011    RMR: GAVE.-status post APC ablation as described above  . Esophagogastroduodenoscopy  05/20/2011    RMR: Portal gastropathy.  GAVE - status post APC ablation  . Hot hemostasis  05/20/2011    Procedure: HOT HEMOSTASIS (ARGON PLASMA COAGULATION/BICAP);  Surgeon: Daneil Dolin, MD;  Location: [facility];  Service: Endoscopy;  Laterality: N/A;  . Esophagogastroduodenoscopy  07/14/2005    Persisting two columns of grade 2 esophageal varices with one cherry red spot on one of these columns distally.  Otherwise normal  . Skin cancer excision  Oct 2013    large lesion rt side of face  . Cardiac catheterization       04/12/2008 [facility]): no significant CAD, mild pulmonary hypertension with normal PVR, normal pulmonary capillary wedge pressure, normal left ventricular end-diastolic pressure.  . Multiple extractions with alveoloplasty  01/14/2012    Procedure: MULTIPLE EXTRACION WITH ALVEOLOPLASTY;  Surgeon: Lenn Cal, DDS;  Location: [facility];  Service: Oral Surgery;  Laterality: N/A;  Extraction of tooth #'s [phone] with alveoloplasty and gross debridement of  remaining teeth   Social History:  reports that he has never smoked. He has never used smokeless tobacco. He reports that he does not drink alcohol or use illicit drugs.  Allergies  Allergen Reactions  . Aspirin Other (See Comments)    Causes pt. To bleed too freely  . Codeine Other (See Comments)    Unable to move limbs after taking Codeine Can't function - legs won't move,etc.  . Tricode Ar [Pseudoeph-Chlorphen-Codeine]   . Other Rash    Adhesive tape  . Tape Rash    Family History  Problem Relation Age of Onset  . Colon cancer Neg Hx   . Inflammatory bowel disease Neg Hx   . Liver disease Neg Hx   . Anesthesia problems Neg Hx   . Hypotension Neg Hx   . Malignant hyperthermia Neg Hx   . Pseudochol deficiency Neg Hx      Prior to Admission medications   Medication Sig Start Date End Date Taking?  Authorizing Provider  digoxin (DIGOX) 0.125 MG tablet Take 1 tablet (0.125 mg total) by mouth daily. 12/11/12  Yes Imogene Burn, PA-C  gabapentin (NEURONTIN) 300 MG capsule Take 300 mg by mouth at bedtime.  05/29/10  Yes Historical Provider, MD  insulin aspart (NOVOLOG) 100 UNIT/ML injection Inject 0-12 Units into the skin 2 (two) times daily. Patient is on Sliding Scale   Yes Historical Provider, MD  insulin glargine (LANTUS) 100 UNIT/ML injection Inject 2-12 Units into the skin at bedtime. 09/22/12  Yes Lezlie Octave Black, NP  lactulose (CHRONULAC) 10 GM/15ML solution Take 30 mLs (20 g total) by mouth 4 (four) times daily. 11/07/12  Yes Orvil Feil, NP  mirtazapine (REMERON) 15 MG tablet 15 mg. Take 15 mg by mouth nightly.   Yes Historical Provider, MD  neomycin-polymyxin-hydrocortisone (CORTISPORIN) otic solution Place 2 drops into the right ear 3 (three) times daily.   Yes Historical Provider, MD  pantoprazole (PROTONIX) 40 MG tablet TAKE (1) TABLET BY MOUTH TWICE DAILY. 08/17/12  Yes Mahala Menghini, PA-C  simvastatin (ZOCOR) 40 MG tablet 40 mg. Take 40 mg by mouth nightly. 12/09/10  Yes  Historical Provider, MD  traZODone (DESYREL) 50 MG tablet 50 mg. Take 50 mg by mouth nightly.   Yes Historical Provider, MD  ALPRAZolam Duanne Moron) 0.5 MG tablet Take 0.5 mg by mouth daily as needed.  02/08/11   Historical Provider, MD  lisinopril (PRINIVIL,ZESTRIL) 5 MG tablet Take 5 mg by mouth daily.  04/07/12   Historical Provider, MD   Physical Exam: Filed Vitals:   01/07/13 1530  BP: 99/53  Pulse: 91  Temp:   Resp:     BP 99/53  Pulse 91  Temp(Src) 97.7 F (36.5 C) (Oral)  Resp 19  Wt 89.359 kg (197 lb)  SpO2 100%  General:  Appears calm and comfortable Eyes: PERRL, normal lids, irises & conjunctiva ENT: grossly normal hearing, lips & tongue Neck: no LAD, masses or thyromegaly Cardiovascular:  S1, S2, irregular, no m/r/g. No LE edema. Respiratory: CTA bilaterally, no w/r/r. Normal respiratory effort. Abdomen: soft, tender in the right groin area, nondistended, positive bowel sounds Skin: no rash or induration seen on limited exam Musculoskeletal: grossly normal tone BUE/BLE Psychiatric: grossly normal mood and affect, speech fluent and appropriate Neurologic: grossly non-focal.          Labs on Admission:  Basic Metabolic Panel:  Recent Labs Lab 01/07/13 1017  NA 141  K 4.5  CL 103  CO2 22  GLUCOSE 190*  BUN 37*  CREATININE 3.03*  CALCIUM 9.2   Liver Function Tests:  Recent Labs Lab 01/07/13 1017  AST 16  ALT 8  ALKPHOS 123*  BILITOT 2.0*  PROT 8.1  ALBUMIN 3.1*    Recent Labs Lab 01/07/13 1017  LIPASE 26    Recent Labs Lab 01/07/13 1017  AMMONIA 43   CBC:  Recent Labs Lab 01/07/13 1017  WBC 12.8*  NEUTROABS 11.2*  HGB 11.1*  HCT 33.0*  MCV 90.7  PLT 127*   Cardiac Enzymes: No results found for this basename: CKTOTAL, CKMB, CKMBINDEX, TROPONINI,  in the last 168 hours  BNP (last 3 results) No results found for this basename: PROBNP,  in the last 8760 hours CBG: No results found for this basename: GLUCAP,  in the last 168  hours  Radiological Exams on Admission: Ct Abdomen Pelvis Wo Contrast  01/07/2013   CLINICAL DATA:  SBO, R inguinal hernia (reduced on ER) SBO, R inguinal hernia (  reduced on ER), ER patient with a hx of HTN, diabetes, A-FIB, Crohn's disease, NASH, esophageal varices, hiatal hernia, TIPS procedure, gallbladder surgery, heart cath, thoracic spine surgery.  EXAM: CT ABDOMEN AND PELVIS WITHOUT CONTRAST  TECHNIQUE: Multidetector CT imaging of the abdomen and pelvis was performed following the standard protocol without intravenous contrast.  COMPARISON:  DG ABD ACUTE W/CHEST dated 01/07/2013; US ABDOMEN COMPLETE dated 11/09/2012; CT ENTERO ABD W/CM dated 03/04/2008; CT ENTERO PELVIS W/CM dated 03/04/2008  FINDINGS: There is a small loculated left pleural effusion. There is a left basilar airspace disease which may represent atelectasis versus pneumonia.  The liver is diminutive in size with a nodular contour consistent with cirrhosis. There is no intrahepatic or extrahepatic biliary ductal dilatation. The gallbladder is surgically absent. The spleen is mildly enlarged without focal abnormality. There is a hypodense, fluid attenuating left renal mass most consistent with a cyst. The right kidney, adrenal glands and pancreas are normal. The bladder is unremarkable.  There is small bowel dilatation with multiple small bowel air-fluid levels with an apparent transition zone at the right inguinal hernia.  There is no pneumoperitoneum, pneumatosis, or portal venous gas. There is no abdominal or pelvic free fluid. There is no lymphadenopathy.  The abdominal aorta is normal in caliber with atherosclerosis. There is a TIPS stent present. There are no lytic or sclerotic osseous lesions. There is lumbar spine spondylosis. There is thoracic spine posterior spinal fusion incompletely visualized.  IMPRESSION: 1. Small bowel obstruction with an apparent transition zone and a partially reduced right inguinal hernia. The distal small bowel  and colon are decompressed. 2. Cirrhosis with a TIPS stent present. 3. Loculated left pleural effusion with left basilar airspace disease which may represent atelectasis versus pneumonia.   Electronically Signed   By: Elige Ko   On: 01/07/2013 16:41   Dg Abd Acute W/chest  01/07/2013   CLINICAL DATA:  Patient unable to stand or move.  EXAM: ACUTE ABDOMEN SERIES (ABDOMEN 2 VIEW & CHEST 1 VIEW)  COMPARISON:  04/21/2011.  FINDINGS: Chronic opacity in the mid to lower left hemithorax appears similar to prior studies favored to represent chronic scarring and moderate chronic left pleural effusion. Right lung appears clear. Probable skin fold projecting over the lower right hemithorax. No evidence of pulmonary edema. Heart size is normal. Mediastinal contours are partially obscured on the left, but otherwise within normal limits. Atherosclerosis in the thoracic aorta. Orthopedic fixation hardware in the lower thoracic and upper lumbar spine.  Multiple views of the abdomen are slightly limited by underpenetration. However, there are several dilated loops of gas-filled small bowel in the central abdomen, measuring up to 4.6 cm in diameter. There is a paucity of colonic gas. No frank pneumoperitoneum. Surgical clips and a stent project over the region of the liver, suggesting prior TIPS.  IMPRESSION: 1. Findings, as above, concerning for small bowel obstruction. 2. No pneumoperitoneum at this time. 3. The appearance the chest is similar to prior studies, with chronic moderate left pleural effusion and probable pleuroparenchymal scarring in the base of the left hemithorax. 4. Additional incidental findings, as above.   Electronically Signed   By: Trudie Reed M.D.   On: 01/07/2013 12:01    Assessment/Plan Principal Problem:   SBO (small bowel obstruction) Active Problems:   Cirrhosis of liver without mention of alcohol   Chronic atrial fibrillation   Chronic kidney disease   NASH (nonalcoholic  steatohepatitis)   Anemia   Thrombocytopenia   Diabetes  Dehydration   Acute renal failure   Inguinal hernia   Small bowel obstruction   1. Small bowel obstruction with right inguinal hernia status post hernia reduction in emergency room. Patient is currently not vomiting. If vomiting recurs, he may need NG tube placement. For now, he'll be kept n.p.o. except for ice chips. Will monitor conservatively for return of bowel function. Surgery will follow in consult. 2. Acute on chronic renal failure. Likely related to vomiting and dehydration. We'll start the patient on IV fluids and monitor renal function. Hold ACE inhibitor. 3. Atrial fibrillation. Likely exacerbated by dehydration. We'll hold off on any rate control medication for now, since blood pressure is borderline. This can be resumed as blood pressure tolerates. Will hopefully improve with rehydration. Not candidate for anticoagulation due to underlying liver disease and tendency to bleed. 4. Diabetes. Sliding scale insulin for now. Resume basal insulin once oral intake is consistent. 5. 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 6. Leukocytosis. No fever. Likely related to underlying abdominal process. Continue to follow. May also be related to the hemoconcentration 7. Anemia, chronic, iron deficiency and GI blood loss. Currently patient is stable. No signs of bleeding. Baseline hemoglobin appears to be now 9, therefore he is likely dehydrated. 8. Hypotension. Likely related to volume depletion. Continue IV fluids. Does not appear to be septic or toxic.   Code Status: full code Family Communication: discussed with patient, no family present Disposition Plan: discharge home once improved  Time spent: 43mins  MEMON,JEHANZEB [facility] Pager [phone]

## 2013-01-07 NOTE — ED Notes (Signed)
Pt only received one dose of diprivan of 44.7mg , no bag hanging, only bolus dose for conscious sedation.

## 2013-01-07 NOTE — Consult Note (Signed)
Reason for Consult: Small bowel obstruction, right inguinal hernia Referring Physician: [facility]  [name] is an 68 y.o. male.  HPI: Patient is a 68 year old white male with multiple medical problems including cirrhosis requiring TIPS procedure, diabetes mellitus, chronic atrial fibrillation, and Crohn's disease who presented emergency room with a 24-hour history of worsening right groin pain. He states he has had an intermittent swelling in the right groin in the past, though it were due to 20 to lie down. He was nauseated and having pain in the right groin region. He was seen the emergency room and was found to have an incarcerated right inguinal hernia. This was reduced with some sedation. A CT scan to follow revealed a small bowel obstruction with evidence of a transition zone in the region of the right inguinal hernia. He states his abdominal pain has eased.  Past Medical History  Diagnosis Date  . Hypertension     negative stress nuclear study in 02/2007; normal coronaries in 2006; Echo in 2006-aortic sclerosis, Mild biatrial enlargement; normal LV  . Pulmonary hypertension     Mild pulmonary hypertension with normal PVR by 2010 cath [facility])  . Hyperlipidemia   . Syncope 2008    orthostatic hypotension  . Diabetes mellitus     insulin; onset in 1999; retinopathy, neuropathy and nephropathy  . Chronic atrial fibrillation     onset in 1995; anticoagulation discontinued in 2006  . Crohn's disease     -lower GI bleed in 08/2002;  Pentasa stopped by Dr. Marty Heck at [facility] ; clinical remission.  . Obesity   . NASH (nonalcoholic steatohepatitis)     cirrhosis with esophageal varices; h/o GI bleeding. EGD-2010 grade 2 esophageal varices-banded with good hemostasis; portal gastropathy, friable gastric mucosa; hiatal hernia.  EGD- recurrent melena one week later showed 2 eschars at previously placed bands, decompressed esophageal varices grade 1-2,four columns, no bleeding stigmata,  normal appearing small bowel, pt has had 1 Hep A/B vaccine.    . Esophageal varices     s/p band ligation multiple times, Portal Hypertensive gastropathy;  . S/P TIPS (transjugular intrahepatic portosystemic shunt)     06/2009-TIPS procedure at [facility]  . GI bleed     07/2009-admitted with recurrent upper GI bleed secondary to esophageal varices-banded x3  . Pleural effusion, left     s/p thoracentesis-multiple  . Depression   . Anemia     Iron def requiring parenteral iron or injections in past, followed by Dr. Everardo All; B12 def  . Duodenal ulcer 1980s  . Ascites   . Hiatal hernia 12/02/2009  . Esophageal erosions     consistent with mild erosive reflux esophagitis  . Portal hypertensive gastropathy   . 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 Gala Romney  . Cancer 10/2011  . Squamous cell carcinoma of face   . Pneumonia     long time ago  . Shortness of breath   . GERD (gastroesophageal reflux disease)   . Arthritis     knees & ankles   . S/P radiation therapy 01/07/12 thru 03/21/2012    Dr. Orlene Erm; Right cheek/parotid and neck nodes  . Chronic kidney disease     stage III; creatinine of 1.6 in 8/07, 1.35 and 05/2008  . Nephrolithiasis     cystoscopy with stone extraction in 1990,   . Morbid obesity 09/20/2012  . Coagulopathy 09/20/2012    Past Surgical History  Procedure Laterality Date  . Tips procedure  09/16/09  .  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 Cholecystectomy    . Circumcision  -1992  . Colonoscopy  -2008    Reportedly normal, done in [place]  . Multiple thoracenteses    . Esophagogastroduodenoscopy  12/02/2009    distal esophageal erosions, small hiatal hernia  . Tips procedure  2011  . Colonoscopy  01/26/2011    RMR: adequate preparation.  normal rectum. Somewhat  diffusely friable colonic mucosa. A single 4 mm polyp in the ascending segment. couple of mucosal breaks in the distal  10 cm of terminal ileum with  a couple of erosions at the junction  the TI  and colon.. Tubular adenoma, chronic ileitis. Next TCS 01/2016.  Marland Kitchen Esophagogastroduodenoscopy  01/26/2011    RMR: Decompressed/abscent esophagel varices s/p TIPS. small hh. GAVE. Procedure: ESOPHAGOGASTRODUODENOSCOPY (EGD);  Surgeon: Daneil Dolin, MD;  [facility];  Service: Endoscopy;  Laterality: N/A;  . Esophagogastroduodenoscopy  04/22/2011    RMR: GAVE.-status post APC ablation as described above  . Esophagogastroduodenoscopy  05/20/2011    RMR: Portal gastropathy.  GAVE - status post APC ablation  . Hot hemostasis  05/20/2011    Procedure: HOT HEMOSTASIS (ARGON PLASMA COAGULATION/BICAP);  Surgeon: Daneil Dolin, MD;  Location: [facility];  Service: Endoscopy;  Laterality: N/A;  . Esophagogastroduodenoscopy  07/14/2005    Persisting two columns of grade 2 esophageal varices with one cherry red spot on one of these columns distally.  Otherwise normal  . Skin cancer excision  Oct 2013    large lesion rt side of face  . Cardiac catheterization       04/12/2008 [facility]): no significant CAD, mild pulmonary hypertension with normal PVR, normal pulmonary capillary wedge pressure, normal left ventricular end-diastolic pressure.  . Multiple extractions with alveoloplasty  01/14/2012    Procedure: MULTIPLE EXTRACION WITH ALVEOLOPLASTY;  Surgeon: Lenn Cal, DDS;  Location: [place];  Service: Oral Surgery;  Laterality: N/A;  Extraction of tooth #'s [phone] with alveoloplasty and gross debridement of remaining teeth    Family History  Problem Relation Age of Onset  . Colon cancer Neg Hx   . Inflammatory bowel disease Neg Hx   . Liver disease Neg Hx   . Anesthesia problems Neg Hx   . Hypotension Neg Hx   . Malignant hyperthermia Neg Hx   . Pseudochol deficiency Neg Hx   . Heart attack Father   . COPD Mother   . Diabetes Mother   . Hypertension Mother   . Heart failure Mother   . Heart disease  Mother   . Hypertension Sister   . Diabetes Sister     Social History:  reports that he has never smoked. He has never used smokeless tobacco. He reports that he does not drink alcohol or use illicit drugs.  Allergies:  Allergies  Allergen Reactions  . Aspirin Other (See Comments)    Causes pt. To bleed too freely  . Codeine Other (See Comments)    Unable to move limbs after taking Codeine Can't function - legs won't move,etc.  . Tricode Ar [Pseudoeph-Chlorphen-Codeine]   . Other Rash    Adhesive tape  . Tape Rash    Medications: I have reviewed the patient's current medications.  Results for orders placed during the hospital encounter of 01/07/13 (from the past 48 hour(s))  CBC WITH DIFFERENTIAL     Status: Abnormal   Collection Time  01/07/13 10:17 AM      Result Value Range   WBC 12.8 (*) 4.0 - 10.5 K/uL   RBC 3.64 (*) 4.22 - 5.81 MIL/uL   Hemoglobin 11.1 (*) 13.0 - 17.0 g/dL   HCT 33.0 (*) 39.0 - 52.0 %   MCV 90.7  78.0 - 100.0 fL   MCH 30.5  26.0 - 34.0 pg   MCHC 33.6  30.0 - 36.0 g/dL   RDW 14.6  11.5 - 15.5 %   Platelets 127 (*) 150 - 400 K/uL   Neutrophils Relative % 88 (*) 43 - 77 %   Neutro Abs 11.2 (*) 1.7 - 7.7 K/uL   Lymphocytes Relative 4 (*) 12 - 46 %   Lymphs Abs 0.5 (*) 0.7 - 4.0 K/uL   Monocytes Relative 8  3 - 12 %   Monocytes Absolute 1.0  0.1 - 1.0 K/uL   Eosinophils Relative 1  0 - 5 %   Eosinophils Absolute 0.1  0.0 - 0.7 K/uL   Basophils Relative 0  0 - 1 %   Basophils Absolute 0.0  0.0 - 0.1 K/uL  COMPREHENSIVE METABOLIC PANEL     Status: Abnormal   Collection Time    01/07/13 10:17 AM      Result Value Range   Sodium 141  137 - 147 mEq/L   Comment: Please note change in reference range.   Potassium 4.5  3.7 - 5.3 mEq/L   Comment: Please note change in reference range.   Chloride 103  96 - 112 mEq/L   CO2 22  19 - 32 mEq/L   Glucose, Bld 190 (*) 70 - 99 mg/dL   BUN 37 (*) 6 - 23 mg/dL   Creatinine, Ser 3.03 (*) 0.50 - 1.35 mg/dL    Calcium 9.2  8.4 - 10.5 mg/dL   Total Protein 8.1  6.0 - 8.3 g/dL   Albumin 3.1 (*) 3.5 - 5.2 g/dL   AST 16  0 - 37 U/L   ALT 8  0 - 53 U/L   Alkaline Phosphatase 123 (*) 39 - 117 U/L   Total Bilirubin 2.0 (*) 0.3 - 1.2 mg/dL   GFR calc non Af Amer 20 (*) >90 mL/min   GFR calc Af Amer 23 (*) >90 mL/min   Comment: (NOTE)     The eGFR has been calculated using the CKD EPI equation.     This calculation has not been validated in all clinical situations.     eGFR's persistently <90 mL/min signify possible Chronic Kidney     Disease.  LIPASE, BLOOD     Status: None   Collection Time    01/07/13 10:17 AM      Result Value Range   Lipase 26  11 - 59 U/L  AMMONIA     Status: None   Collection Time    01/07/13 10:17 AM      Result Value Range   Ammonia 43  11 - 60 umol/L  PROTIME-INR     Status: Abnormal   Collection Time    01/07/13 10:17 AM      Result Value Range   Prothrombin Time 16.3 (*) 11.6 - 15.2 seconds   INR 1.34  0.00 - 1.49  LACTIC ACID, PLASMA     Status: Abnormal   Collection Time    01/07/13 10:20 AM      Result Value Range   Lactic Acid, Venous 2.9 (*) 0.5 - 2.2 mmol/L    Ct Abdomen  Pelvis Wo Contrast  01/07/2013   CLINICAL DATA:  SBO, R inguinal hernia (reduced on ER) SBO, R inguinal hernia (reduced on ER), ER patient with a hx of HTN, diabetes, A-FIB, Crohn's disease, NASH, esophageal varices, hiatal hernia, TIPS procedure, gallbladder surgery, heart cath, thoracic spine surgery.  EXAM: CT ABDOMEN AND PELVIS WITHOUT CONTRAST  TECHNIQUE: Multidetector CT imaging of the abdomen and pelvis was performed following the standard protocol without intravenous contrast.  COMPARISON:  DG ABD ACUTE W/CHEST dated 01/07/2013; US ABDOMEN COMPLETE dated 11/09/2012; CT ENTERO ABD W/CM dated 03/04/2008; CT ENTERO PELVIS W/CM dated 03/04/2008  FINDINGS: There is a small loculated left pleural effusion. There is a left basilar airspace disease which may represent atelectasis versus pneumonia.   The liver is diminutive in size with a nodular contour consistent with cirrhosis. There is no intrahepatic or extrahepatic biliary ductal dilatation. The gallbladder is surgically absent. The spleen is mildly enlarged without focal abnormality. There is a hypodense, fluid attenuating left renal mass most consistent with a cyst. The right kidney, adrenal glands and pancreas are normal. The bladder is unremarkable.  There is small bowel dilatation with multiple small bowel air-fluid levels with an apparent transition zone at the right inguinal hernia.  There is no pneumoperitoneum, pneumatosis, or portal venous gas. There is no abdominal or pelvic free fluid. There is no lymphadenopathy.  The abdominal aorta is normal in caliber with atherosclerosis. There is a TIPS stent present. There are no lytic or sclerotic osseous lesions. There is lumbar spine spondylosis. There is thoracic spine posterior spinal fusion incompletely visualized.  IMPRESSION: 1. Small bowel obstruction with an apparent transition zone and a partially reduced right inguinal hernia. The distal small bowel and colon are decompressed. 2. Cirrhosis with a TIPS stent present. 3. Loculated left pleural effusion with left basilar airspace disease which may represent atelectasis versus pneumonia.   Electronically Signed   By: Elige Ko   On: 01/07/2013 16:41   Dg Abd Acute W/chest  01/07/2013   CLINICAL DATA:  Patient unable to stand or move.  EXAM: ACUTE ABDOMEN SERIES (ABDOMEN 2 VIEW & CHEST 1 VIEW)  COMPARISON:  04/21/2011.  FINDINGS: Chronic opacity in the mid to lower left hemithorax appears similar to prior studies favored to represent chronic scarring and moderate chronic left pleural effusion. Right lung appears clear. Probable skin fold projecting over the lower right hemithorax. No evidence of pulmonary edema. Heart size is normal. Mediastinal contours are partially obscured on the left, but otherwise within normal limits. Atherosclerosis  in the thoracic aorta. Orthopedic fixation hardware in the lower thoracic and upper lumbar spine.  Multiple views of the abdomen are slightly limited by underpenetration. However, there are several dilated loops of gas-filled small bowel in the central abdomen, measuring up to 4.6 cm in diameter. There is a paucity of colonic gas. No frank pneumoperitoneum. Surgical clips and a stent project over the region of the liver, suggesting prior TIPS.  IMPRESSION: 1. Findings, as above, concerning for small bowel obstruction. 2. No pneumoperitoneum at this time. 3. The appearance the chest is similar to prior studies, with chronic moderate left pleural effusion and probable pleuroparenchymal scarring in the base of the left hemithorax. 4. Additional incidental findings, as above.   Electronically Signed   By: Trudie Reed M.D.   On: 01/07/2013 12:01    ROS: See chart Blood pressure 108/57, pulse 107, temperature 98.4 F (36.9 C), temperature source Oral, resp. rate 18, height 6\' 1"  (1.854 m), weight  86.4 kg (190 lb 7.6 oz), SpO2 100.00%. Physical Exam: Pleasant white male no acute distress. Abdomen is soft, nontender, nondistended. He does have laxity in the right inguinal region with no apparent incarcerated hernia present. He is tender in that area. I could not appreciate any bowel within the inguinal canal.  Assessment/Plan: Impression: Small bowel obstruction secondary to an incarcerated right anal hernia which has been reduced successfully. Abdominal exam is unremarkable, so I doubt there was any significant injury to the bowel. He does have acute on chronic renal failure and dehydration secondary to his bowel obstruction. No need for acute surgical intervention at this time. Given his multiple medical problems, any surgical intervention will be at higher risk to the patient. Patient can't be on clear liquid diet. Will follow with you.  Zachariah Pavek A 01/07/2013, 8:26 PM

## 2013-01-07 NOTE — ED Notes (Signed)
abd pain,n/v since yesterday

## 2013-01-07 NOTE — ED Provider Notes (Signed)
CSN: [id number]     Arrival date & time 01/07/13  [id number] History  This chart was scribed for Orpah Greek, MD by Jenne Campus, ED Scribe. This patient was seen in room APA02/APA02 and the patient's care was started at 9:47 AM.   Chief Complaint  Patient presents with  . Abdominal Pain    The history is provided by the patient. No language interpreter was used.    HPI Comments: [name] is a 68 y.o. male who presents to the Emergency Department complaining of constant lower abdominal pain that started yesterday with 5 associated episodes of non-bloody emesis since onset. Last BM was yesterday around 4 PM. He states that he has tried OTC medications without improvement. He denies any prior bowel blockages. He states that he originally thought that the symptoms were due to a kindey stone but became concerned when the vomiting continued.    Past Medical History  Diagnosis Date  . Hypertension     negative stress nuclear study in 02/2007; normal coronaries in 2006; Echo in 2006-aortic sclerosis, Mild biatrial enlargement; normal LV  . Pulmonary hypertension     Mild pulmonary hypertension with normal PVR by 2010 cath [facility])  . Hyperlipidemia   . Syncope 2008    orthostatic hypotension  . Diabetes mellitus     insulin; onset in 1999; retinopathy, neuropathy and nephropathy  . Chronic atrial fibrillation     onset in 1995; anticoagulation discontinued in 2006  . Crohn's disease     -lower GI bleed in 08/2002;  Pentasa stopped by Dr. Marty Heck at [facility] ; clinical remission.  . Obesity   . NASH (nonalcoholic steatohepatitis)     cirrhosis with esophageal varices; h/o GI bleeding. EGD-2010 grade 2 esophageal varices-banded with good hemostasis; portal gastropathy, friable gastric mucosa; hiatal hernia.  EGD- recurrent melena one week later showed 2 eschars at previously placed bands, decompressed esophageal varices grade 1-2,four columns, no bleeding stigmata, normal appearing  small bowel, pt has had 1 Hep A/B vaccine.    . Esophageal varices     s/p band ligation multiple times, Portal Hypertensive gastropathy;  . S/P TIPS (transjugular intrahepatic portosystemic shunt)     06/2009-TIPS procedure at [facility]  . GI bleed     07/2009-admitted with recurrent upper GI bleed secondary to esophageal varices-banded x3  . Pleural effusion, left     s/p thoracentesis-multiple  . Depression   . Anemia     Iron def requiring parenteral iron or injections in past, followed by Dr. Everardo All; B12 def  . Duodenal ulcer 1980s  . Ascites   . Hiatal hernia 12/02/2009  . Esophageal erosions     consistent with mild erosive reflux esophagitis  . Portal hypertensive gastropathy   . 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 Gala Romney  . Cancer 10/2011  . Squamous cell carcinoma of face   . Pneumonia     long time ago  . Shortness of breath   . GERD (gastroesophageal reflux disease)   . Arthritis     knees & ankles   . S/P radiation therapy 01/07/12 thru 03/21/2012    Dr. Orlene Erm; Right cheek/parotid and neck nodes  . Chronic kidney disease     stage III; creatinine of 1.6 in 8/07, 1.35 and 05/2008  . Nephrolithiasis     cystoscopy with stone extraction in 1990,   . Morbid obesity 09/20/2012  . Coagulopathy 09/20/2012   Past Surgical History  Procedure Laterality  Date  . Tips procedure  09/16/09  . 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 Cholecystectomy    . Circumcision  -1992  . Colonoscopy  -2008    Reportedly normal, done in [place]  . Multiple thoracenteses    . Esophagogastroduodenoscopy  12/02/2009    distal esophageal erosions, small hiatal hernia  . Tips procedure  2011  . Colonoscopy  01/26/2011    RMR: adequate preparation.  normal rectum. Somewhat  diffusely friable colonic mucosa. A single 4 mm polyp in the ascending segment. couple of mucosal breaks in the distal 10 cm of terminal  ileum with  a couple of erosions at the junction  the TI  and colon.. Tubular adenoma, chronic ileitis. Next TCS 01/2016.  Marland Kitchen Esophagogastroduodenoscopy  01/26/2011    RMR: Decompressed/abscent esophagel varices s/p TIPS. small hh. GAVE. Procedure: ESOPHAGOGASTRODUODENOSCOPY (EGD);  Surgeon: Daneil Dolin, MD;  [facility];  Service: Endoscopy;  Laterality: N/A;  . Esophagogastroduodenoscopy  04/22/2011    RMR: GAVE.-status post APC ablation as described above  . Esophagogastroduodenoscopy  05/20/2011    RMR: Portal gastropathy.  GAVE - status post APC ablation  . Hot hemostasis  05/20/2011    Procedure: HOT HEMOSTASIS (ARGON PLASMA COAGULATION/BICAP);  Surgeon: Daneil Dolin, MD;  Location: [facility];  Service: Endoscopy;  Laterality: N/A;  . Esophagogastroduodenoscopy  07/14/2005    Persisting two columns of grade 2 esophageal varices with one cherry red spot on one of these columns distally.  Otherwise normal  . Skin cancer excision  Oct 2013    large lesion rt side of face  . Cardiac catheterization       04/12/2008 [facility]): no significant CAD, mild pulmonary hypertension with normal PVR, normal pulmonary capillary wedge pressure, normal left ventricular end-diastolic pressure.  . Multiple extractions with alveoloplasty  01/14/2012    Procedure: MULTIPLE EXTRACION WITH ALVEOLOPLASTY;  Surgeon: Lenn Cal, DDS;  Location: [place];  Service: Oral Surgery;  Laterality: N/A;  Extraction of tooth #'s [phone] with alveoloplasty and gross debridement of remaining teeth   Family History  Problem Relation Age of Onset  . Colon cancer Neg Hx   . Inflammatory bowel disease Neg Hx   . Liver disease Neg Hx   . Anesthesia problems Neg Hx   . Hypotension Neg Hx   . Malignant hyperthermia Neg Hx   . Pseudochol deficiency Neg Hx    History  Substance Use Topics  . Smoking status: Never Smoker   . Smokeless tobacco: Never Used  . Alcohol Use: No    Review of Systems   Constitutional: Negative for fever.  Gastrointestinal: Positive for nausea, vomiting, abdominal pain and constipation. Negative for diarrhea.  All other systems reviewed and are negative.    Allergies  Aspirin; Codeine; Tricode ar; Other; and Tape  Home Medications   Current Outpatient Rx  Name  Route  Sig  Dispense  Refill  . ALPRAZolam (XANAX) 0.5 MG tablet   Oral   Take 0.5 mg by mouth daily.          . digoxin (DIGOX) 0.125 MG tablet   Oral   Take 1 tablet (0.125 mg total) by mouth daily.   30 tablet   6   . furosemide (LASIX) 20 MG tablet      TAKE 1/2 TABLET BY MOUTH DAILY.   30 tablet   5   . furosemide (  LASIX) 20 MG tablet      TAKE 1/2 TABLET BY MOUTH EVERY OTHER DAY.         Marland Kitchen gabapentin (NEURONTIN) 300 MG capsule   Oral   Take 300 mg by mouth at bedtime.          . insulin aspart (NOVOLOG) 100 UNIT/ML injection   Subcutaneous   Inject 0-20 Units into the skin 2 (two) times daily. Patient is on Sliding Scale         . insulin glargine (LANTUS) 100 UNIT/ML injection   Subcutaneous   Inject 0.3 mLs (30 Units total) into the skin at bedtime.   10 mL   12   . lactulose (CHRONULAC) 10 GM/15ML solution   Oral   Take 30 mLs (20 g total) by mouth 4 (four) times daily.   1892 mL   11   . lisinopril (PRINIVIL,ZESTRIL) 5 MG tablet   Oral   Take 5 mg by mouth daily.          Marland Kitchen lisinopril (PRINIVIL,ZESTRIL) 5 MG tablet               . mirtazapine (REMERON) 15 MG tablet      15 mg. Take 15 mg by mouth nightly.         . mometasone (NASONEX) 50 MCG/ACT nasal spray   Nasal   Place 2 sprays into the nose 2 (two) times daily.         . ondansetron (ZOFRAN) 4 MG tablet      4 mg. Take 4 mg by mouth every 8 (eight) hours as needed for Nausea.         . ondansetron (ZOFRAN) 4 MG tablet      4 mg.         . pantoprazole (PROTONIX) 40 MG tablet      TAKE (1) TABLET BY MOUTH TWICE DAILY.   60 tablet   5   . propranolol  (INDERAL) 10 MG tablet   Oral   Take 0.5 tablets (5 mg total) by mouth 2 (two) times daily.   45 tablet   3   . rifaximin (XIFAXAN) 200 MG tablet   Oral   Take 550 mg by mouth 2 (two) times daily.          . rifaximin (XIFAXAN) 550 MG TABS tablet   Oral   Take 550 mg by mouth.         . simvastatin (ZOCOR) 40 MG tablet      40 mg. Take 40 mg by mouth nightly.         . traZODone (DESYREL) 50 MG tablet      50 mg. Take 50 mg by mouth nightly.          Triage Vitals: BP 91/49  Pulse 88  Temp(Src) 97.7 F (36.5 C) (Oral)  Resp 18  SpO2 100%  Physical Exam  Nursing note and vitals reviewed. Constitutional: He is oriented to person, place, and time. He appears well-developed and well-nourished. No distress.  Pt is actively vomiting   HENT:  Head: Normocephalic and atraumatic.  Right Ear: Hearing normal.  Left Ear: Hearing normal.  Nose: Nose normal.  Mouth/Throat: Oropharynx is clear and moist and mucous membranes are normal.  Eyes: Conjunctivae and EOM are normal. Pupils are equal, round, and reactive to light.  Neck: Normal range of motion. Neck supple.  Cardiovascular: Regular rhythm, S1 normal and S2 normal.  Exam reveals no gallop and  no friction rub.   No murmur heard. Pulmonary/Chest: Effort normal and breath sounds normal. No respiratory distress. He exhibits no tenderness.  Abdominal: Soft. Normal appearance and bowel sounds are normal. There is no hepatosplenomegaly. There is tenderness (minimal suprapubic tenderness). There is no rebound, no guarding, no tenderness at McBurney's point and negative Murphy's sign. A hernia is present.  Massive right inguinal hernia that appears incarcerated   Musculoskeletal: Normal range of motion.  Neurological: He is alert and oriented to person, place, and time. He has normal strength. No cranial nerve deficit or sensory deficit. Coordination normal. GCS eye subscore is 4. GCS verbal subscore is 5. GCS motor subscore is  6.  Skin: Skin is warm, dry and intact. No rash noted. No cyanosis.  Psychiatric: He has a normal mood and affect. His speech is normal and behavior is normal. Thought content normal.    ED Course  Procedural sedation Date/Time: 01/07/2013 1:48 PM Performed by: Orpah Greek. Authorized by: Orpah Greek Consent: Verbal consent obtained. written consent obtained. Risks and benefits: risks, benefits and alternatives were discussed Consent given by: patient Patient understanding: patient states understanding of the procedure being performed Patient consent: the patient's understanding of the procedure matches consent given Procedure consent: procedure consent matches procedure scheduled Relevant documents: relevant documents present and verified Test results: test results available and properly labeled Site marked: the operative site was marked Imaging studies: imaging studies available Patient identity confirmed: verbally with patient, arm band and hospital-assigned identification number Patient sedated: yes Sedatives: propofol Sedation start date/time: 01/07/2013 1:48 PM Sedation end date/time: 01/07/2013 1:59 PM Vitals: Vital signs were monitored during sedation. Patient tolerance: Patient tolerated the procedure well with no immediate complications.  Hernia reduction Date/Time: 01/07/2013 5:09 PM Performed by: Orpah Greek. Authorized by: Orpah Greek Consent: Verbal consent obtained. written consent obtained. Risks and benefits: risks, benefits and alternatives were discussed Consent given by: patient Patient understanding: patient states understanding of the procedure being performed Patient consent: the patient's understanding of the procedure matches consent given Procedure consent: procedure consent matches procedure scheduled Relevant documents: relevant documents present and verified Test results: test results available and properly  labeled Site marked: the operative site was marked Imaging studies: imaging studies available Patient identity confirmed: verbally with patient, arm band and hospital-assigned identification number Time out: Immediately prior to procedure a "time out" was called to verify the correct patient, procedure, equipment, support staff and site/side marked as required. Local anesthesia used: no Patient sedated: yes Sedatives: propofol Comments: Right inguinal hernia reduced with gentle direct pressure and intermittent milking technique until reduction of hernia mass was palpated   (including critical care time)  Medications  ondansetron (ZOFRAN) injection 4 mg (not administered)  0.9 %  sodium chloride infusion (not administered)      DIAGNOSTIC STUDIES: Oxygen Saturation is 100% on RA, normal by my interpretation.    COORDINATION OF CARE: 9:51 AM-Discussed treatment plan which includes medications, CXR, CBC panel, CMP and UA with pt at bedside and pt agreed to plan.   Labs Review Labs Reviewed - No data to display Imaging Review Ct Abdomen Pelvis Wo Contrast  01/07/2013   CLINICAL DATA:  SBO, R inguinal hernia (reduced on ER) SBO, R inguinal hernia (reduced on ER), ER patient with a hx of HTN, diabetes, A-FIB, Crohn's disease, NASH, esophageal varices, hiatal hernia, TIPS procedure, gallbladder surgery, heart cath, thoracic spine surgery.  EXAM: CT ABDOMEN AND PELVIS WITHOUT CONTRAST  TECHNIQUE: Multidetector CT imaging  of the abdomen and pelvis was performed following the standard protocol without intravenous contrast.  COMPARISON:  DG ABD ACUTE W/CHEST dated 01/07/2013; US ABDOMEN COMPLETE dated 11/09/2012; CT ENTERO ABD W/CM dated 03/04/2008; CT ENTERO PELVIS W/CM dated 03/04/2008  FINDINGS: There is a small loculated left pleural effusion. There is a left basilar airspace disease which may represent atelectasis versus pneumonia.  The liver is diminutive in size with a nodular contour consistent with  cirrhosis. There is no intrahepatic or extrahepatic biliary ductal dilatation. The gallbladder is surgically absent. The spleen is mildly enlarged without focal abnormality. There is a hypodense, fluid attenuating left renal mass most consistent with a cyst. The right kidney, adrenal glands and pancreas are normal. The bladder is unremarkable.  There is small bowel dilatation with multiple small bowel air-fluid levels with an apparent transition zone at the right inguinal hernia.  There is no pneumoperitoneum, pneumatosis, or portal venous gas. There is no abdominal or pelvic free fluid. There is no lymphadenopathy.  The abdominal aorta is normal in caliber with atherosclerosis. There is a TIPS stent present. There are no lytic or sclerotic osseous lesions. There is lumbar spine spondylosis. There is thoracic spine posterior spinal fusion incompletely visualized.  IMPRESSION: 1. Small bowel obstruction with an apparent transition zone and a partially reduced right inguinal hernia. The distal small bowel and colon are decompressed. 2. Cirrhosis with a TIPS stent present. 3. Loculated left pleural effusion with left basilar airspace disease which may represent atelectasis versus pneumonia.   Electronically Signed   By: Kathreen Devoid   On: 01/07/2013 16:41   Dg Abd Acute W/chest  01/07/2013   CLINICAL DATA:  Patient unable to stand or move.  EXAM: ACUTE ABDOMEN SERIES (ABDOMEN 2 VIEW & CHEST 1 VIEW)  COMPARISON:  04/21/2011.  FINDINGS: Chronic opacity in the mid to lower left hemithorax appears similar to prior studies favored to represent chronic scarring and moderate chronic left pleural effusion. Right lung appears clear. Probable skin fold projecting over the lower right hemithorax. No evidence of pulmonary edema. Heart size is normal. Mediastinal contours are partially obscured on the left, but otherwise within normal limits. Atherosclerosis in the thoracic aorta. Orthopedic fixation hardware in the lower  thoracic and upper lumbar spine.  Multiple views of the abdomen are slightly limited by underpenetration. However, there are several dilated loops of gas-filled small bowel in the central abdomen, measuring up to 4.6 cm in diameter. There is a paucity of colonic gas. No frank pneumoperitoneum. Surgical clips and a stent project over the region of the liver, suggesting prior TIPS.  IMPRESSION: 1. Findings, as above, concerning for small bowel obstruction. 2. No pneumoperitoneum at this time. 3. The appearance the chest is similar to prior studies, with chronic moderate left pleural effusion and probable pleuroparenchymal scarring in the base of the left hemithorax. 4. Additional incidental findings, as above.   Electronically Signed   By: Vinnie Langton M.D.   On: 01/07/2013 12:01    EKG Interpretation   None       MDM  Diagnosis: 1. Small bowel obstruction 2. Right inguinal hernia  Patient presents to the ER for evaluation of nausea and vomiting. Upon examination it was discovered that the patient has a large right inguinal hernia which is causing small bowel obstruction. Case was discussed initially with Doctor Arnoldo Morale and it was determined that it would be safer to be too sedate the patient and perform hernia reduction. This was performed. See above outlined procedures.  After hernia reduction was performed, patient was sent for CT scan with oral contrast to further evaluate. Report reveals that there is still a transition zone with incomplete reduction of hernia. It is not clear if this represents residual findings after successfully reduced hernia or if there is some hernia still present. This was discussed with Doctor Arnoldo Morale once again. Patient will be admitted to the hospitalist service because of his multiple medical problems. At this point he will be hydrated and followed by Doctor Arnoldo Morale. No NG tube is recommended at this time, but patient might require it if he has continued  symptomatology.  I personally performed the services described in this documentation, which was scribed in my presence. The recorded information has been reviewed and is accurate.    Orpah Greek, MD 01/07/13 [phone]

## 2013-01-07 NOTE — ED Notes (Addendum)
EDP attempting to reduce area with little moaning from lpt. Receiving 564ml bolus NS per vo dr Betsey Holiday at bedside

## 2013-01-07 NOTE — ED Notes (Signed)
Pt still moaning some. Area mostly reduced, much softer at this time and almost symmetrical to other side per EDP

## 2013-01-08 DIAGNOSIS — E440 Moderate protein-calorie malnutrition: Secondary | ICD-10-CM | POA: Diagnosis not present

## 2013-01-08 DIAGNOSIS — D649 Anemia, unspecified: Secondary | ICD-10-CM

## 2013-01-08 LAB — URINALYSIS, ROUTINE W REFLEX MICROSCOPIC
Glucose, UA: NEGATIVE mg/dL
Ketones, ur: NEGATIVE mg/dL
Leukocytes, UA: NEGATIVE
NITRITE: NEGATIVE
PH: 5 (ref 5.0–8.0)
Protein, ur: 100 mg/dL — AB
UROBILINOGEN UA: 0.2 mg/dL (ref 0.0–1.0)

## 2013-01-08 LAB — BASIC METABOLIC PANEL
BUN: 46 mg/dL — ABNORMAL HIGH (ref 6–23)
CO2: 21 mEq/L (ref 19–32)
Calcium: 8.2 mg/dL — ABNORMAL LOW (ref 8.4–10.5)
Chloride: 107 mEq/L (ref 96–112)
Creatinine, Ser: 3.99 mg/dL — ABNORMAL HIGH (ref 0.50–1.35)
GFR calc Af Amer: 16 mL/min — ABNORMAL LOW (ref >90)
GFR, EST NON AFRICAN AMERICAN: 14 mL/min — AB (ref >90)
GLUCOSE: 181 mg/dL — AB (ref 70–99)
POTASSIUM: 5.4 meq/L — AB (ref 3.7–5.3)
Sodium: 144 mEq/L (ref 137–147)

## 2013-01-08 LAB — CBC
HCT: 29.4 % — ABNORMAL LOW (ref 39.0–52.0)
Hemoglobin: 9.7 g/dL — ABNORMAL LOW (ref 13.0–17.0)
MCH: 30.6 pg (ref 26.0–34.0)
MCHC: 33 g/dL (ref 30.0–36.0)
MCV: 92.7 fL (ref 78.0–100.0)
Platelets: 91 10*3/uL — ABNORMAL LOW (ref 150–400)
RBC: 3.17 MIL/uL — AB (ref 4.22–5.81)
RDW: 15 % (ref 11.5–15.5)
WBC: 18.9 10*3/uL — AB (ref 4.0–10.5)

## 2013-01-08 LAB — HEMOGLOBIN A1C
HEMOGLOBIN A1C: 6.7 % — AB (ref <5.7)
Mean Plasma Glucose: 146 mg/dL — ABNORMAL HIGH (ref <117)

## 2013-01-08 LAB — TSH: TSH: 2.229 u[IU]/mL (ref 0.350–4.500)

## 2013-01-08 LAB — URINE MICROSCOPIC-ADD ON

## 2013-01-08 LAB — GLUCOSE, CAPILLARY
GLUCOSE-CAPILLARY: 147 mg/dL — AB (ref 70–99)
GLUCOSE-CAPILLARY: 169 mg/dL — AB (ref 70–99)
Glucose-Capillary: 140 mg/dL — ABNORMAL HIGH (ref 70–99)
Glucose-Capillary: 148 mg/dL — ABNORMAL HIGH (ref 70–99)
Glucose-Capillary: 153 mg/dL — ABNORMAL HIGH (ref 70–99)
Glucose-Capillary: 158 mg/dL — ABNORMAL HIGH (ref 70–99)

## 2013-01-08 MED ORDER — PIPERACILLIN-TAZOBACTAM 3.375 G IVPB
INTRAVENOUS | Status: AC
  Filled 2013-01-08: qty 100

## 2013-01-08 MED ORDER — PIPERACILLIN-TAZOBACTAM 3.375 G IVPB
3.3750 g | Freq: Three times a day (TID) | INTRAVENOUS | Status: DC
  Administered 2013-01-08 – 2013-01-09 (×2): 3.375 g via INTRAVENOUS
  Filled 2013-01-08 (×3): qty 50

## 2013-01-08 MED ORDER — PROMETHAZINE HCL 25 MG/ML IJ SOLN
6.2500 mg | Freq: Four times a day (QID) | INTRAMUSCULAR | Status: DC | PRN
  Administered 2013-01-08 – 2013-01-14 (×5): 6.25 mg via INTRAVENOUS
  Filled 2013-01-08 (×5): qty 1

## 2013-01-08 MED ORDER — SODIUM CHLORIDE 0.9 % IV BOLUS (SEPSIS)
500.0000 mL | Freq: Once | INTRAVENOUS | Status: AC
  Administered 2013-01-08: 500 mL via INTRAVENOUS

## 2013-01-08 MED ORDER — SODIUM POLYSTYRENE SULFONATE 15 GM/60ML PO SUSP
30.0000 g | Freq: Once | ORAL | Status: AC
  Administered 2013-01-08: 30 g via ORAL
  Filled 2013-01-08: qty 120

## 2013-01-08 NOTE — Progress Notes (Signed)
Pt has vomited a total of 5 times now since beginning of day shift.  Emesis brown in color.  Dyanne Carrel paged and made aware.  Pt unable to have PRN Zofran until 2:30, asked NP for new order for Phenergan until Dr. Arnoldo Morale can see patient.

## 2013-01-08 NOTE — Progress Notes (Signed)
Patient seen and examined. Above note reviewed.  Patient continues to have nausea and vomiting.  NG tube placed today.  Surgery following.  Renal function is worse today despite hydration. Will request nephrology input  WBC continues to trend up. He is afebrile and clinically appears stable.  Will cover with empiric antibiotics for now.  Thrombocytopenia is related to underlying liver cirrhosis.  MEMON,JEHANZEB

## 2013-01-08 NOTE — Progress Notes (Signed)
Subjective: Patient's has had multiple episodes of emesis, though he states his abdominal pain is much improved. He denies any right groin pain at the present time.  Objective: Vital signs in last 24 hours: Temp:  [98.3 F (36.8 C)-98.6 F (37 C)] 98.3 F (36.8 C) (01/05 0611) Pulse Rate:  [90-108] 106 (01/05 0611) Resp:  [16-18] 18 (01/05 0611) BP: (95-108)/(50-58) 95/55 mmHg (01/05 0611) SpO2:  [100 %] 100 % (01/05 0611) Weight:  [86.4 kg (190 lb 7.6 oz)] 86.4 kg (190 lb 7.6 oz) (01/04 1900) Last BM Date: 01/06/13  Intake/Output from previous day: 01/04 0701 - 01/05 0700 In: 1352.1 [I.V.:1352.1] Out: -  Intake/Output this shift: Total I/O In: -  Out: 500 [Urine:500]  General appearance: alert, cooperative and no distress GI: Soft. No abdominal distention noted. Has chronic umbilical hernia. No significant right inguinal hernia appreciated. No rigidity noted.  Lab Results:   Recent Labs  01/07/13 1017 01/08/13 0530  WBC 12.8* 18.9*  HGB 11.1* 9.7*  HCT 33.0* 29.4*  PLT 127* 91*   BMET  Recent Labs  01/07/13 1017 01/08/13 0530  NA 141 144  K 4.5 5.4*  CL 103 107  CO2 22 21  GLUCOSE 190* 181*  BUN 37* 46*  CREATININE 3.03* 3.99*  CALCIUM 9.2 8.2*   PT/INR  Recent Labs  01/07/13 1017  LABPROT 16.3*  INR 1.34    Studies/Results: Ct Abdomen Pelvis Wo Contrast  01/07/2013   CLINICAL DATA:  SBO, R inguinal hernia (reduced on ER) SBO, R inguinal hernia (reduced on ER), ER patient with a hx of HTN, diabetes, A-FIB, Crohn's disease, NASH, esophageal varices, hiatal hernia, TIPS procedure, gallbladder surgery, heart cath, thoracic spine surgery.  EXAM: CT ABDOMEN AND PELVIS WITHOUT CONTRAST  TECHNIQUE: Multidetector CT imaging of the abdomen and pelvis was performed following the standard protocol without intravenous contrast.  COMPARISON:  DG ABD ACUTE W/CHEST dated 01/07/2013; US ABDOMEN COMPLETE dated 11/09/2012; CT ENTERO ABD W/CM dated 03/04/2008; CT ENTERO  PELVIS W/CM dated 03/04/2008  FINDINGS: There is a small loculated left pleural effusion. There is a left basilar airspace disease which may represent atelectasis versus pneumonia.  The liver is diminutive in size with a nodular contour consistent with cirrhosis. There is no intrahepatic or extrahepatic biliary ductal dilatation. The gallbladder is surgically absent. The spleen is mildly enlarged without focal abnormality. There is a hypodense, fluid attenuating left renal mass most consistent with a cyst. The right kidney, adrenal glands and pancreas are normal. The bladder is unremarkable.  There is small bowel dilatation with multiple small bowel air-fluid levels with an apparent transition zone at the right inguinal hernia.  There is no pneumoperitoneum, pneumatosis, or portal venous gas. There is no abdominal or pelvic free fluid. There is no lymphadenopathy.  The abdominal aorta is normal in caliber with atherosclerosis. There is a TIPS stent present. There are no lytic or sclerotic osseous lesions. There is lumbar spine spondylosis. There is thoracic spine posterior spinal fusion incompletely visualized.  IMPRESSION: 1. Small bowel obstruction with an apparent transition zone and a partially reduced right inguinal hernia. The distal small bowel and colon are decompressed. 2. Cirrhosis with a TIPS stent present. 3. Loculated left pleural effusion with left basilar airspace disease which may represent atelectasis versus pneumonia.   Electronically Signed   By: Kathreen Devoid   On: 01/07/2013 16:41   Dg Abd Acute W/chest  01/07/2013   CLINICAL DATA:  Patient unable to stand or move.  EXAM: ACUTE  ABDOMEN SERIES (ABDOMEN 2 VIEW & CHEST 1 VIEW)  COMPARISON:  04/21/2011.  FINDINGS: Chronic opacity in the mid to lower left hemithorax appears similar to prior studies favored to represent chronic scarring and moderate chronic left pleural effusion. Right lung appears clear. Probable skin fold projecting over the lower  right hemithorax. No evidence of pulmonary edema. Heart size is normal. Mediastinal contours are partially obscured on the left, but otherwise within normal limits. Atherosclerosis in the thoracic aorta. Orthopedic fixation hardware in the lower thoracic and upper lumbar spine.  Multiple views of the abdomen are slightly limited by underpenetration. However, there are several dilated loops of gas-filled small bowel in the central abdomen, measuring up to 4.6 cm in diameter. There is a paucity of colonic gas. No frank pneumoperitoneum. Surgical clips and a stent project over the region of the liver, suggesting prior TIPS.  IMPRESSION: 1. Findings, as above, concerning for small bowel obstruction. 2. No pneumoperitoneum at this time. 3. The appearance the chest is similar to prior studies, with chronic moderate left pleural effusion and probable pleuroparenchymal scarring in the base of the left hemithorax. 4. Additional incidental findings, as above.   Electronically Signed   By: Vinnie Langton M.D.   On: 01/07/2013 12:01    Anti-infectives: Anti-infectives   None      Assessment/Plan: Impression: Small bowel traction, incarcerated right inguinal hernia which has since resolved. Patient's multiple medical problems and his lab tests looked worse than he does. His abdominal examination is unremarkable in the is no evidence of incarceration or perforation of the bowel. No rigidity is noted in his abdomen. He does not need an emergent right inguinal herniorrhaphy this time as he is no incarcerated bowel present. We'll continue to monitor him at this time. Given his recurrent emesis, NG tube placement can be done, though should be done with care given his history of esophageal varices. Management has been discussed with [facility] hospitalists. We'll continue to follow with you.  LOS: 1 day    Leilanee Righetti A 01/08/2013

## 2013-01-08 NOTE — Progress Notes (Signed)
ANTIBIOTIC CONSULT NOTE - INITIAL  Pharmacy Consult for Zosyn Indication: Abdominal Infection  Allergies  Allergen Reactions  . Aspirin Other (See Comments)    Causes pt. To bleed too freely  . Codeine Other (See Comments)    Unable to move limbs after taking Codeine Can't function - legs won't move,etc.  . Tricode Ar [Pseudoeph-Chlorphen-Codeine]   . Other Rash    Adhesive tape  . Tape Rash    Patient Measurements: Height: 6\' 1"  (185.4 cm) Weight: 190 lb 7.6 oz (86.4 kg) IBW/kg (Calculated) : 79.9  Vital Signs: Temp: 97.5 F (36.4 C) (01/05 1702) Temp src: Oral (01/05 1702) BP: 109/62 mmHg (01/05 1702) Pulse Rate: 110 (01/05 1702) Intake/Output from previous day: 01/04 0701 - 01/05 0700 In: 1352.1 [I.V.:1352.1] Out: -  Intake/Output from this shift: Total I/O In: -  Out: 550 [Emesis/NG output:550]  Labs:  Recent Labs  01/07/13 1017 01/08/13 0530  WBC 12.8* 18.9*  HGB 11.1* 9.7*  PLT 127* 91*  CREATININE 3.03* 3.99*   Estimated Creatinine Clearance: 20.3 ml/min (by C-G formula based on Cr of 3.99). No results found for this basename: VANCOTROUGH, VANCOPEAK, VANCORANDOM, GENTTROUGH, GENTPEAK, GENTRANDOM, TOBRATROUGH, TOBRAPEAK, TOBRARND, AMIKACINPEAK, AMIKACINTROU, AMIKACIN,  in the last 72 hours   Microbiology: No results found for this or any previous visit (from the past 720 hour(s)).  Medical History: Past Medical History  Diagnosis Date  . Hypertension     negative stress nuclear study in 02/2007; normal coronaries in 2006; Echo in 2006-aortic sclerosis, Mild biatrial enlargement; normal LV  . Pulmonary hypertension     Mild pulmonary hypertension with normal PVR by 2010 cath [facility])  . Hyperlipidemia   . Syncope 2008    orthostatic hypotension  . Diabetes mellitus     insulin; onset in 1999; retinopathy, neuropathy and nephropathy  . Chronic atrial fibrillation     onset in 1995; anticoagulation discontinued in 2006  . Crohn's disease     -lower  GI bleed in 08/2002;  Pentasa stopped by Dr. Marty Heck at [facility] ; clinical remission.  . Obesity   . NASH (nonalcoholic steatohepatitis)     cirrhosis with esophageal varices; h/o GI bleeding. EGD-2010 grade 2 esophageal varices-banded with good hemostasis; portal gastropathy, friable gastric mucosa; hiatal hernia.  EGD- recurrent melena one week later showed 2 eschars at previously placed bands, decompressed esophageal varices grade 1-2,four columns, no bleeding stigmata, normal appearing small bowel, pt has had 1 Hep A/B vaccine.    . Esophageal varices     s/p band ligation multiple times, Portal Hypertensive gastropathy;  . S/P TIPS (transjugular intrahepatic portosystemic shunt)     06/2009-TIPS procedure at [facility]  . GI bleed     07/2009-admitted with recurrent upper GI bleed secondary to esophageal varices-banded x3  . Pleural effusion, left     s/p thoracentesis-multiple  . Depression   . Anemia     Iron def requiring parenteral iron or injections in past, followed by Dr. Everardo All; B12 def  . Duodenal ulcer 1980s  . Ascites   . Hiatal hernia 12/02/2009  . Esophageal erosions     consistent with mild erosive reflux esophagitis  . Portal hypertensive gastropathy   . 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 Gala Romney  . Cancer 10/2011  . Squamous cell carcinoma of face   . Pneumonia     long time ago  . Shortness of breath   . GERD (gastroesophageal reflux disease)   . Arthritis  knees & ankles   . S/P radiation therapy 01/07/12 thru 03/21/2012    Dr. Orlene Erm; Right cheek/parotid and neck nodes  . Chronic kidney disease     stage III; creatinine of 1.6 in 8/07, 1.35 and 05/2008  . Nephrolithiasis     cystoscopy with stone extraction in 1990,   . Morbid obesity 09/20/2012  . Coagulopathy 09/20/2012   Medications:  Scheduled:  . insulin aspart  0-15 Units Subcutaneous Q4H  . pantoprazole (PROTONIX) IV  40 mg Intravenous Q12H  . sodium chloride  3 mL Intravenous Q12H    Assessment: Okay for Protocol, ABX for abdominal infection.  Renal function borderline for Zosyn  Adjustment.  Zosyn 1/5 >>  Goal of Therapy:  Eradicate infection.  Plan:  Zosyn 3.375gm IV every 8 hours. Follow-up micro data, labs, vitals. Follow up culture results  Pricilla Larsson 01/08/2013,7:38 PM

## 2013-01-08 NOTE — Progress Notes (Signed)
Utilization Review Complete  

## 2013-01-08 NOTE — Progress Notes (Signed)
Patient has not voided for approx. 12 hours. No distention.  No urge to void.  On call MD notified.  Waiting for response.  Will continue to monitor.

## 2013-01-08 NOTE — Progress Notes (Signed)
INITIAL NUTRITION ASSESSMENT  DOCUMENTATION CODES Per approved criteria  -Non-severe (moderate) malnutrition in the context of chronic illness   INTERVENTION: Resource Breeze po TID, each supplement provides 250 kcal and 9 grams of protein  NUTRITION DIAGNOSIS: Inadequate oral intake related to altered GI function as evidenced by SBO, vomiting, wt loss hx.   Goal: Pt to meet >/= 90% of their estimated nutrition needs   Monitor:  Diet advancement, tolerance, weight changes and meal intake  Reason for Assessment: Malnutrition Screen   68 y.o. male  Admitting Dx: SBO (small bowel obstruction) Patient Active Problem List   Diagnosis Date Noted  . SBO (small bowel obstruction) 01/07/2013  . Inguinal hernia 01/07/2013  . Small bowel obstruction 01/07/2013  . Urinary hesitancy 11/07/2012  . Hypotension, unspecified 09/20/2012  . Acute renal failure 09/20/2012  . Hypovolemia 09/20/2012  . Generalized weakness 09/20/2012  . Morbid obesity 09/20/2012  . Coagulopathy 09/20/2012  . Dehydration 09/19/2012  . Orthostatic hypotension 09/19/2012  . Diabetes 06/07/2012  . Carotid bruit 01/19/2012  . Chronic periodontitis 01/14/2012  . Abdominal pain 12/06/2011  . Chronic diarrhea 05/27/2011  . Melena 04/22/2011  . Blood loss anemia 04/22/2011  . Thrombocytopenia 04/22/2011  . Upper GI bleed 04/21/2011  . Edema of left lower extremity 04/13/2011  . Iron deficiency anemia 04/13/2011  . B12 deficiency anemia 04/13/2011    Class: History of  . Heme positive stool 01/11/2011  . Epigastric pain 06/29/2010  . Hypertension   . Pulmonary hypertension   . Diabetes mellitus   . Chronic atrial fibrillation   . Chronic kidney disease   . NASH (nonalcoholic steatohepatitis)   . Esophageal varices   . S/P TIPS (transjugular intrahepatic portosystemic shunt)   . Anemia   . UMBILICAL HERNIA 0000000  . Cirrhosis of liver without mention of alcohol 06/04/2008  . HYPERLIPIDEMIA  02/22/2007    ASSESSMENT: Pt is 68 yo male with small bowel obstruction related to incarcerated anal hernia. Currently no surgical intervention indicated. Poor tolerance of clear liquids this morning with another vomiting episode. He says he has not been able to eat since Saturday. Multiple medical problems (noted above). Hx also includes unplanned wt loss over past year ~40#, 17% x 9 months (related to chron's?). In addition, he has mild-moderate physical signs of malnutrition: muscle wasting (clavicle, patella, interosseous), and orbital fat loss. Pt is at risk for worsening malnutrition due to his poor tolerance of oral intake and increased protein-energy requirements associated with his cirrhosis.   Height: Ht Readings from Last 1 Encounters:  01/07/13 6\' 1"  (1.854 m)    Weight: Wt Readings from Last 1 Encounters:  01/07/13 190 lb 7.6 oz (86.4 kg)    Ideal Body Weight: 184# (83.6 kg)  % Ideal Body Weight: 97%  Wt Readings from Last 10 Encounters:  01/07/13 190 lb 7.6 oz (86.4 kg)  12/07/12 197 lb (89.359 kg)  11/07/12 206 lb 12.8 oz (93.804 kg)  09/19/12 209 lb 12.8 oz (95.165 kg)  09/19/12 209 lb 12.8 oz (95.165 kg)  08/09/12 210 lb (95.255 kg)  08/08/12 210 lb 6.4 oz (95.437 kg)  07/28/12 211 lb 3.2 oz (95.8 kg)  04/11/12 230 lb 12.8 oz (104.69 kg)  04/11/12 230 lb 12.8 oz (104.69 kg)    Usual Body Weight: 210-230#  % Usual Body Weight: 90%  BMI:  Body mass index is 25.14 kg/(m^2).overweight  Estimated Nutritional Needs: Kcal: 2100-2400 Protein: 92-101 gr Fluid: 2.1-2.4 liters daily  Skin: intact  Diet Order: Clear  Liquid  EDUCATION NEEDS: -Education not appropriate at this time   Intake/Output Summary (Last 24 hours) at 01/08/13 1054 Last data filed at 01/08/13 0625  Gross per 24 hour  Intake 1352.08 ml  Output      0 ml  Net 1352.08 ml    Last BM:  01/06/13   Labs:   Recent Labs Lab 01/07/13 1017 01/08/13 0530  NA 141 144  K 4.5 5.4*  CL  103 107  CO2 22 21  BUN 37* 46*  CREATININE 3.03* 3.99*  CALCIUM 9.2 8.2*  GLUCOSE 190* 181*    CBG (last 3)   Recent Labs  01/08/13 0028 01/08/13 0359 01/08/13 0754  GLUCAP 140* 148* 158*    Scheduled Meds: . heparin  5,000 Units Subcutaneous Q8H  . insulin aspart  0-15 Units Subcutaneous Q4H  . pantoprazole (PROTONIX) IV  40 mg Intravenous Q12H  . sodium chloride  3 mL Intravenous Q12H  . [DISCONTINUED] sodium chloride   Intravenous STAT    Continuous Infusions: . sodium chloride 125 mL/hr at 01/07/13 1936    Past Medical History  Diagnosis Date  . Hypertension     negative stress nuclear study in 02/2007; normal coronaries in 2006; Echo in 2006-aortic sclerosis, Mild biatrial enlargement; normal LV  . Pulmonary hypertension     Mild pulmonary hypertension with normal PVR by 2010 cath [facility])  . Hyperlipidemia   . Syncope 2008    orthostatic hypotension  . Diabetes mellitus     insulin; onset in 1999; retinopathy, neuropathy and nephropathy  . Chronic atrial fibrillation     onset in 1995; anticoagulation discontinued in 2006  . Crohn's disease     -lower GI bleed in 08/2002;  Pentasa stopped by Dr. Marty Heck at [facility] ; clinical remission.  . Obesity   . NASH (nonalcoholic steatohepatitis)     cirrhosis with esophageal varices; h/o GI bleeding. EGD-2010 grade 2 esophageal varices-banded with good hemostasis; portal gastropathy, friable gastric mucosa; hiatal hernia.  EGD- recurrent melena one week later showed 2 eschars at previously placed bands, decompressed esophageal varices grade 1-2,four columns, no bleeding stigmata, normal appearing small bowel, pt has had 1 Hep A/B vaccine.    . Esophageal varices     s/p band ligation multiple times, Portal Hypertensive gastropathy;  . S/P TIPS (transjugular intrahepatic portosystemic shunt)     06/2009-TIPS procedure at [facility]  . GI bleed     07/2009-admitted with recurrent upper GI bleed secondary to esophageal  varices-banded x3  . Pleural effusion, left     s/p thoracentesis-multiple  . Depression   . Anemia     Iron def requiring parenteral iron or injections in past, followed by Dr. Everardo All; B12 def  . Duodenal ulcer 1980s  . Ascites   . Hiatal hernia 12/02/2009  . Esophageal erosions     consistent with mild erosive reflux esophagitis  . Portal hypertensive gastropathy   . 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 Gala Romney  . Cancer 10/2011  . Squamous cell carcinoma of face   . Pneumonia     long time ago  . Shortness of breath   . GERD (gastroesophageal reflux disease)   . Arthritis     knees & ankles   . S/P radiation therapy 01/07/12 thru 03/21/2012    Dr. Orlene Erm; Right cheek/parotid and neck nodes  . Chronic kidney disease     stage III; creatinine of 1.6 in 8/07, 1.35 and 05/2008  .  Nephrolithiasis     cystoscopy with stone extraction in 1990,   . Morbid obesity 09/20/2012  . Coagulopathy 09/20/2012    Past Surgical History  Procedure Laterality Date  . Tips procedure  09/16/09  . 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 Cholecystectomy    . Circumcision  -1992  . Colonoscopy  -2008    Reportedly normal, done in [place]  . Multiple thoracenteses    . Esophagogastroduodenoscopy  12/02/2009    distal esophageal erosions, small hiatal hernia  . Tips procedure  2011  . Colonoscopy  01/26/2011    RMR: adequate preparation.  normal rectum. Somewhat  diffusely friable colonic mucosa. A single 4 mm polyp in the ascending segment. couple of mucosal breaks in the distal 10 cm of terminal ileum with  a couple of erosions at the junction  the TI  and colon.. Tubular adenoma, chronic ileitis. Next TCS 01/2016.  Marland Kitchen Esophagogastroduodenoscopy  01/26/2011    RMR: Decompressed/abscent esophagel varices s/p TIPS. small hh. GAVE. Procedure: ESOPHAGOGASTRODUODENOSCOPY (EGD);  Surgeon: Daneil Dolin, MD;  [facility]  [facility];  Service: Endoscopy;  Laterality: N/A;  . Esophagogastroduodenoscopy  04/22/2011    RMR: GAVE.-status post APC ablation as described above  . Esophagogastroduodenoscopy  05/20/2011    RMR: Portal gastropathy.  GAVE - status post APC ablation  . Hot hemostasis  05/20/2011    Procedure: HOT HEMOSTASIS (ARGON PLASMA COAGULATION/BICAP);  Surgeon: Daneil Dolin, MD;  Location: [facility];  Service: Endoscopy;  Laterality: N/A;  . Esophagogastroduodenoscopy  07/14/2005    Persisting two columns of grade 2 esophageal varices with one cherry red spot on one of these columns distally.  Otherwise normal  . Skin cancer excision  Oct 2013    large lesion rt side of face  . Cardiac catheterization       04/12/2008 [facility]): no significant CAD, mild pulmonary hypertension with normal PVR, normal pulmonary capillary wedge pressure, normal left ventricular end-diastolic pressure.  . Multiple extractions with alveoloplasty  01/14/2012    Procedure: MULTIPLE EXTRACION WITH ALVEOLOPLASTY;  Surgeon: Lenn Cal, DDS;  Location: [place];  Service: Oral Surgery;  Laterality: N/A;  Extraction of tooth #'s [phone] with alveoloplasty and gross debridement of remaining teeth    Colman Cater MS,RD,CSG,LDN Office: [phone] Pager: [phone]

## 2013-01-08 NOTE — Progress Notes (Signed)
[facility] PROGRESS NOTE  [name] [id number] DOB: May 10, 1945 DOA: 01/07/2013 PCP: Leonides Grills, MD  Assessment/Plan: 1. Small bowel obstruction with right inguinal hernia status post hernia reduction in emergency room. Per nursing, vomited moderate amount emesis that "looked and smelled like stool".   Will monitor conservatively for return of bowel function. Appreciate surgery consult. Likely needs NG but not without risk given hx varices 2. Acute on chronic renal failure. Worsening. Likely related to vomiting and dehydration. Poor urine output. Given 58ml IV fluid bolus this am.  Pt reports need to void. Bladder scan with>360ml. Will do in and out cath. Continue patient on IV fluids and monitor renal function. Holding ACE inhibitor. 3. Atrial fibrillation. Likely exacerbated by dehydration. Continue to  hold off on any rate control medication for now, since blood pressure remains borderline. This will be resumed as blood pressure tolerates. Not candidate for anticoagulation due to underlying liver disease and tendency to bleed. Monitor closely 4. Diabetes. CBG range 140-158. Sliding scale insulin for now. Resume basal insulin once oral intake is consistent. 5. 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 6. Leukocytosis. Trending up. No fever. Likely related to underlying abdominal process. Continue to follow. May also be related to the hemoconcentration 7. Anemia, chronic, iron deficiency and GI blood loss. Currently patient is stable. No signs of bleeding. Baseline hemoglobin appears to be now 9, therefore he is likely dehydrated. 8. Hypotension. Likely related to volume depletion. Continue IV fluids. Does not appear to be septic or toxic. 9. Hyperkalemia: mild.  related to #2. Will monitor closely   Code Status: full Family Communication: none present Disposition Plan: home when  ready   Consultants:  surgery  Procedures:  none  Antibiotics:  none  HPI/Subjective: Lying in bed alert reports feeling nauseated. Complains right groin pain.   Objective: Filed Vitals:   01/08/13 0611  BP: 95/55  Pulse: 106  Temp: 98.3 F (36.8 C)  Resp: 18    Intake/Output Summary (Last 24 hours) at 01/08/13 1054 Last data filed at 01/08/13 [id number]  Gross per 24 hour  Intake 1352.08 ml  Output      0 ml  Net 1352.08 ml   Filed Weights   01/07/13 1330 01/07/13 1900  Weight: 89.359 kg (197 lb) 86.4 kg (190 lb 7.6 oz)    Exam:   General:  Frail appearing alert NAD  Cardiovascular: irregularly irregular no MGR   Respiratory: normal effort BS clear bilaterally no wheeze  Abdomen: non-distended but round and soft. +BS but sluggish. Moderate tenderness in RLQ to palpation.   Musculoskeletal: moves all extremities no clubbing or cyanosis   Data Reviewed: Basic Metabolic Panel:  Recent Labs Lab 01/07/13 1017 01/08/13 0530  NA 141 144  K 4.5 5.4*  CL 103 107  CO2 22 21  GLUCOSE 190* 181*  BUN 37* 46*  CREATININE 3.03* 3.99*  CALCIUM 9.2 8.2*   Liver Function Tests:  Recent Labs Lab 01/07/13 1017  AST 16  ALT 8  ALKPHOS 123*  BILITOT 2.0*  PROT 8.1  ALBUMIN 3.1*    Recent Labs Lab 01/07/13 1017  LIPASE 26    Recent Labs Lab 01/07/13 1017  AMMONIA 43   CBC:  Recent Labs Lab 01/07/13 1017 01/08/13 0530  WBC 12.8* 18.9*  NEUTROABS 11.2*  --   HGB 11.1* 9.7*  HCT 33.0* 29.4*  MCV 90.7 92.7  PLT 127* 91*   Cardiac Enzymes: No results found for this basename: CKTOTAL, CKMB, CKMBINDEX, TROPONINI,  in the last 168 hours BNP (last 3 results) No results found for this basename: PROBNP,  in the last 8760 hours CBG:  Recent Labs Lab 01/07/13 1952 01/08/13 0028 01/08/13 0359 01/08/13 0754  GLUCAP 153* 140* 148* 158*    No results found for this or any previous visit (from the past 240 hour(s)).   Studies: Ct Abdomen  Pelvis Wo Contrast  01/07/2013   CLINICAL DATA:  SBO, R inguinal hernia (reduced on ER) SBO, R inguinal hernia (reduced on ER), ER patient with a hx of HTN, diabetes, A-FIB, Crohn's disease, NASH, esophageal varices, hiatal hernia, TIPS procedure, gallbladder surgery, heart cath, thoracic spine surgery.  EXAM: CT ABDOMEN AND PELVIS WITHOUT CONTRAST  TECHNIQUE: Multidetector CT imaging of the abdomen and pelvis was performed following the standard protocol without intravenous contrast.  COMPARISON:  DG ABD ACUTE W/CHEST dated 01/07/2013; US ABDOMEN COMPLETE dated 11/09/2012; CT ENTERO ABD W/CM dated 03/04/2008; CT ENTERO PELVIS W/CM dated 03/04/2008  FINDINGS: There is a small loculated left pleural effusion. There is a left basilar airspace disease which may represent atelectasis versus pneumonia.  The liver is diminutive in size with a nodular contour consistent with cirrhosis. There is no intrahepatic or extrahepatic biliary ductal dilatation. The gallbladder is surgically absent. The spleen is mildly enlarged without focal abnormality. There is a hypodense, fluid attenuating left renal mass most consistent with a cyst. The right kidney, adrenal glands and pancreas are normal. The bladder is unremarkable.  There is small bowel dilatation with multiple small bowel air-fluid levels with an apparent transition zone at the right inguinal hernia.  There is no pneumoperitoneum, pneumatosis, or portal venous gas. There is no abdominal or pelvic free fluid. There is no lymphadenopathy.  The abdominal aorta is normal in caliber with atherosclerosis. There is a TIPS stent present. There are no lytic or sclerotic osseous lesions. There is lumbar spine spondylosis. There is thoracic spine posterior spinal fusion incompletely visualized.  IMPRESSION: 1. Small bowel obstruction with an apparent transition zone and a partially reduced right inguinal hernia. The distal small bowel and colon are decompressed. 2. Cirrhosis with a TIPS  stent present. 3. Loculated left pleural effusion with left basilar airspace disease which may represent atelectasis versus pneumonia.   Electronically Signed   By: Kathreen Devoid   On: 01/07/2013 16:41   Dg Abd Acute W/chest  01/07/2013   CLINICAL DATA:  Patient unable to stand or move.  EXAM: ACUTE ABDOMEN SERIES (ABDOMEN 2 VIEW & CHEST 1 VIEW)  COMPARISON:  04/21/2011.  FINDINGS: Chronic opacity in the mid to lower left hemithorax appears similar to prior studies favored to represent chronic scarring and moderate chronic left pleural effusion. Right lung appears clear. Probable skin fold projecting over the lower right hemithorax. No evidence of pulmonary edema. Heart size is normal. Mediastinal contours are partially obscured on the left, but otherwise within normal limits. Atherosclerosis in the thoracic aorta. Orthopedic fixation hardware in the lower thoracic and upper lumbar spine.  Multiple views of the abdomen are slightly limited by underpenetration. However, there are several dilated loops of gas-filled small bowel in the central abdomen, measuring up to 4.6 cm in diameter. There is a paucity of colonic gas. No frank pneumoperitoneum. Surgical clips and a stent project over the region of the liver, suggesting prior TIPS.  IMPRESSION: 1. Findings, as above, concerning for small bowel obstruction. 2. No pneumoperitoneum at this time. 3. The appearance the chest is similar to prior studies, with chronic moderate left  pleural effusion and probable pleuroparenchymal scarring in the base of the left hemithorax. 4. Additional incidental findings, as above.   Electronically Signed   By: Vinnie Langton M.D.   On: 01/07/2013 12:01    Scheduled Meds: . heparin  5,000 Units Subcutaneous Q8H  . insulin aspart  0-15 Units Subcutaneous Q4H  . pantoprazole (PROTONIX) IV  40 mg Intravenous Q12H  . sodium chloride  3 mL Intravenous Q12H  . [DISCONTINUED] sodium chloride   Intravenous STAT   Continuous  Infusions: . sodium chloride 125 mL/hr at 01/07/13 1936    Principal Problem:   SBO (small bowel obstruction) Active Problems:   Cirrhosis of liver without mention of alcohol   Chronic atrial fibrillation   Chronic kidney disease   NASH (nonalcoholic steatohepatitis)   Anemia   Thrombocytopenia   Diabetes   Dehydration   Acute renal failure   Inguinal hernia   Small bowel obstruction    Time spent: 35 minutes    [facility] Pager [phone]. If 7PM-7AM, please contact night-coverage at [email or web address], password [facility] 01/08/2013, 10:54 AM  LOS: 1 day

## 2013-01-08 NOTE — Progress Notes (Signed)
Pt unable to void overnight into morning shift.  Bladder scan revealed over 200 cc's of urine. Pt states "I feel like I need to pee but I just can't."  Dyanne Carrel, NP made aware. New order for one time In and out cath. Over 500 cc's of dark brown, clear, malodorous urine with in and out.  Sent for UA per order. Pt states "I feel somewhat better now."

## 2013-01-09 DIAGNOSIS — K7689 Other specified diseases of liver: Secondary | ICD-10-CM

## 2013-01-09 DIAGNOSIS — D696 Thrombocytopenia, unspecified: Secondary | ICD-10-CM

## 2013-01-09 LAB — CBC
HCT: 26.6 % — ABNORMAL LOW (ref 39.0–52.0)
HEMOGLOBIN: 8.8 g/dL — AB (ref 13.0–17.0)
MCH: 30.7 pg (ref 26.0–34.0)
MCHC: 33.1 g/dL (ref 30.0–36.0)
MCV: 92.7 fL (ref 78.0–100.0)
Platelets: 58 10*3/uL — ABNORMAL LOW (ref 150–400)
RBC: 2.87 MIL/uL — ABNORMAL LOW (ref 4.22–5.81)
RDW: 15.4 % (ref 11.5–15.5)
WBC: 10.4 10*3/uL (ref 4.0–10.5)

## 2013-01-09 LAB — GLUCOSE, CAPILLARY
GLUCOSE-CAPILLARY: 156 mg/dL — AB (ref 70–99)
GLUCOSE-CAPILLARY: 146 mg/dL — AB (ref 70–99)
GLUCOSE-CAPILLARY: 136 mg/dL — AB (ref 70–99)
GLUCOSE-CAPILLARY: 107 mg/dL — AB (ref 70–99)
Glucose-Capillary: 127 mg/dL — ABNORMAL HIGH (ref 70–99)
Glucose-Capillary: 179 mg/dL — ABNORMAL HIGH (ref 70–99)

## 2013-01-09 LAB — COMPREHENSIVE METABOLIC PANEL
ALT: 6 U/L (ref 0–53)
AST: 13 U/L (ref 0–37)
Albumin: 2.3 g/dL — ABNORMAL LOW (ref 3.5–5.2)
Alkaline Phosphatase: 86 U/L (ref 39–117)
BUN: 64 mg/dL — ABNORMAL HIGH (ref 6–23)
CALCIUM: 7.3 mg/dL — AB (ref 8.4–10.5)
CO2: 20 mEq/L (ref 19–32)
Chloride: 109 mEq/L (ref 96–112)
Creatinine, Ser: 5.33 mg/dL — ABNORMAL HIGH (ref 0.50–1.35)
GFR, EST AFRICAN AMERICAN: 12 mL/min — AB (ref >90)
GFR, EST NON AFRICAN AMERICAN: 10 mL/min — AB (ref >90)
GLUCOSE: 171 mg/dL — AB (ref 70–99)
Potassium: 4.6 mEq/L (ref 3.7–5.3)
SODIUM: 143 meq/L (ref 137–147)
Total Bilirubin: 1.5 mg/dL — ABNORMAL HIGH (ref 0.3–1.2)
Total Protein: 6.5 g/dL (ref 6.0–8.3)

## 2013-01-09 LAB — AMMONIA: Ammonia: 33 umol/L (ref 11–60)

## 2013-01-09 MED ORDER — FUROSEMIDE 10 MG/ML IJ SOLN
INTRAMUSCULAR | Status: AC
  Filled 2013-01-09: qty 10

## 2013-01-09 MED ORDER — PIPERACILLIN-TAZOBACTAM IN DEX 2-0.25 GM/50ML IV SOLN
2.2500 g | Freq: Three times a day (TID) | INTRAVENOUS | Status: DC
  Administered 2013-01-09 – 2013-01-16 (×21): 2.25 g via INTRAVENOUS
  Filled 2013-01-09 (×22): qty 50

## 2013-01-09 MED ORDER — FUROSEMIDE 10 MG/ML IJ SOLN
100.0000 mg | Freq: Two times a day (BID) | INTRAVENOUS | Status: DC
  Administered 2013-01-09: 100 mg via INTRAVENOUS
  Filled 2013-01-09 (×2): qty 10

## 2013-01-09 NOTE — Progress Notes (Signed)
[facility] PROGRESS NOTE  [name] MIW:[id number] DOB: [birth year] DOA: 01/07/2013 PCP: Leonides Grills, MD  Assessment/Plan: Small bowel obstruction with right inguinal hernia status post hernia reduction in emergency room. NG with moderate amount dark green drainage. Will monitor conservatively for return of bowel function. Zosyn day #2. Afebrile with normal white count.  Appreciate surgery consult.    Acute on chronic renal failure. Continues to worsen. Requesting nephrology consult. decreased urine output. Given 548ml IV fluid bolus x2 yesterday. Difficulty voiding yesterday.  Bladder scan with>337ml. In and out cath yielding >500cc.  Continue patient on IV fluids and monitor renal function. Holding ACE inhibitor.   Atrial fibrillation. Likely exacerbated by dehydration. Continue to hold off on any rate control medication for now, since blood pressure remains borderline but slightly improved today. This will be resumed as blood pressure tolerates. Not candidate for anticoagulation due to underlying liver disease and tendency to bleed. Monitor closely   Diabetes. CBG range 171-179. Sliding scale insulin for now. Resume basal insulin once oral intake is consistent.   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 Continues to trend down.    Leukocytosis. resolved. No fever. Likely related to underlying abdominal process. Continue to follow. May also be related to the hemoconcentration   Anemia, chronic, iron deficiency and GI blood loss. Hg slowly trending downward. No signs of bleeding. Baseline hemoglobin appears to be now 9, therefore he is likely dehydrated.   Hypotension.somewhat improved today.  Likely related to volume depletion. SBP range100-109  Continue IV fluids. Does not appear to be septic or toxic.   Hyperkalemia: resolved this am. Mild. likely related to #2.     Code Status: full Family Communication: none present Disposition Plan: home when ready.     Consultants: General surgery nephrology  Procedures:  NG tube 01/08/13  Antibiotics:  Zosyn 01/08/13>>  HPI/Subjective: Lying in bed with eyes closed. Respirations even non-labored. Easily aroused. Reports feeling hungry.  Objective: Filed Vitals:   01/09/13 0534  BP: 100/62  Pulse: 94  Temp: 98.2 F (36.8 C)  Resp: 18    Intake/Output Summary (Last 24 hours) at 01/09/13 1129 Last data filed at 01/09/13 0542  Gross per 24 hour  Intake 2622.92 ml  Output   1550 ml  Net 1072.92 ml   Filed Weights   01/07/13 1330 01/07/13 1900 01/09/13 0534  Weight: 89.359 kg (197 lb) 86.4 kg (190 lb 7.6 oz) 88.1 kg (194 lb 3.6 oz)    Exam:   General:  Appears ill but not uncomfortable  Cardiovascular: irregularly irregular No MGR   Respiratory: normal effort BS somewhat distant but clear bilaterally no wheeze  Abdomen: soft, non-distended. +BS mild to moderate tenderness particularly RLQ. No guarding  Musculoskeletal: no clubbing no cyanosis  Data Reviewed: Basic Metabolic Panel:  Recent Labs Lab 01/07/13 1017 01/08/13 0530 01/09/13 0540  NA 141 144 143  K 4.5 5.4* 4.6  CL 103 107 109  CO2 22 21 20   GLUCOSE 190* 181* 171*  BUN 37* 46* 64*  CREATININE 3.03* 3.99* 5.33*  CALCIUM 9.2 8.2* 7.3*   Liver Function Tests:  Recent Labs Lab 01/07/13 1017 01/09/13 0540  AST 16 13  ALT 8 6  ALKPHOS 123* 86  BILITOT 2.0* 1.5*  PROT 8.1 6.5  ALBUMIN 3.1* 2.3*    Recent Labs Lab 01/07/13 1017  LIPASE 26    Recent Labs Lab 01/07/13 1017  AMMONIA 43   CBC:  Recent Labs Lab 01/07/13 1017 01/08/13 0530 01/09/13 0540  WBC 12.8* 18.9* 10.4  NEUTROABS 11.2*  --   --   HGB 11.1* 9.7* 8.8*  HCT 33.0* 29.4* 26.6*  MCV 90.7 92.7 92.7  PLT 127* 91* 58*   Cardiac Enzymes: No results found for this basename: CKTOTAL, CKMB, CKMBINDEX, TROPONINI,  in the last 168 hours BNP (last 3 results) No results found for this basename: PROBNP,  in the last 8760  hours CBG:  Recent Labs Lab 01/08/13 0754 01/08/13 1217 01/08/13 1642 01/08/13 2001 01/09/13 0015  GLUCAP 158* 153* 147* 169* 179*    No results found for this or any previous visit (from the past 240 hour(s)).   Studies: Ct Abdomen Pelvis Wo Contrast  01/07/2013   CLINICAL DATA:  SBO, R inguinal hernia (reduced on ER) SBO, R inguinal hernia (reduced on ER), ER patient with a hx of HTN, diabetes, A-FIB, Crohn's disease, NASH, esophageal varices, hiatal hernia, TIPS procedure, gallbladder surgery, heart cath, thoracic spine surgery.  EXAM: CT ABDOMEN AND PELVIS WITHOUT CONTRAST  TECHNIQUE: Multidetector CT imaging of the abdomen and pelvis was performed following the standard protocol without intravenous contrast.  COMPARISON:  DG ABD ACUTE W/CHEST dated 01/07/2013; US ABDOMEN COMPLETE dated 11/09/2012; CT ENTERO ABD W/CM dated 03/04/2008; CT ENTERO PELVIS W/CM dated 03/04/2008  FINDINGS: There is a small loculated left pleural effusion. There is a left basilar airspace disease which may represent atelectasis versus pneumonia.  The liver is diminutive in size with a nodular contour consistent with cirrhosis. There is no intrahepatic or extrahepatic biliary ductal dilatation. The gallbladder is surgically absent. The spleen is mildly enlarged without focal abnormality. There is a hypodense, fluid attenuating left renal mass most consistent with a cyst. The right kidney, adrenal glands and pancreas are normal. The bladder is unremarkable.  There is small bowel dilatation with multiple small bowel air-fluid levels with an apparent transition zone at the right inguinal hernia.  There is no pneumoperitoneum, pneumatosis, or portal venous gas. There is no abdominal or pelvic free fluid. There is no lymphadenopathy.  The abdominal aorta is normal in caliber with atherosclerosis. There is a TIPS stent present. There are no lytic or sclerotic osseous lesions. There is lumbar spine spondylosis. There is thoracic spine  posterior spinal fusion incompletely visualized.  IMPRESSION: 1. Small bowel obstruction with an apparent transition zone and a partially reduced right inguinal hernia. The distal small bowel and colon are decompressed. 2. Cirrhosis with a TIPS stent present. 3. Loculated left pleural effusion with left basilar airspace disease which may represent atelectasis versus pneumonia.   Electronically Signed   By: Kathreen Devoid   On: 01/07/2013 16:41   Dg Abd Acute W/chest  01/07/2013   CLINICAL DATA:  Patient unable to stand or move.  EXAM: ACUTE ABDOMEN SERIES (ABDOMEN 2 VIEW & CHEST 1 VIEW)  COMPARISON:  04/21/2011.  FINDINGS: Chronic opacity in the mid to lower left hemithorax appears similar to prior studies favored to represent chronic scarring and moderate chronic left pleural effusion. Right lung appears clear. Probable skin fold projecting over the lower right hemithorax. No evidence of pulmonary edema. Heart size is normal. Mediastinal contours are partially obscured on the left, but otherwise within normal limits. Atherosclerosis in the thoracic aorta. Orthopedic fixation hardware in the lower thoracic and upper lumbar spine.  Multiple views of the abdomen are slightly limited by underpenetration. However, there are several dilated loops of gas-filled small bowel in the central abdomen, measuring up to 4.6 cm in diameter. There is a paucity of  colonic gas. No frank pneumoperitoneum. Surgical clips and a stent project over the region of the liver, suggesting prior TIPS.  IMPRESSION: 1. Findings, as above, concerning for small bowel obstruction. 2. No pneumoperitoneum at this time. 3. The appearance the chest is similar to prior studies, with chronic moderate left pleural effusion and probable pleuroparenchymal scarring in the base of the left hemithorax. 4. Additional incidental findings, as above.   Electronically Signed   By: Vinnie Langton M.D.   On: 01/07/2013 12:01    Scheduled Meds: . insulin aspart   0-15 Units Subcutaneous Q4H  . pantoprazole (PROTONIX) IV  40 mg Intravenous Q12H  . piperacillin-tazobactam (ZOSYN)  IV  2.25 g Intravenous Q8H  . sodium chloride  3 mL Intravenous Q12H   Continuous Infusions: . sodium chloride 125 mL/hr at 01/09/13 0300    Principal Problem:   SBO (small bowel obstruction) Active Problems:   Cirrhosis of liver without mention of alcohol   Chronic atrial fibrillation   Chronic kidney disease   NASH (nonalcoholic steatohepatitis)   Anemia   Thrombocytopenia   Diabetes   Dehydration   Acute renal failure   Inguinal hernia   Small bowel obstruction   Malnutrition of moderate degree    Time spent: 35 minutes    [facility] Pager [phone]. If 7PM-7AM, please contact night-coverage at [email or web address], password [facility] 01/09/2013, 11:29 AM  LOS: 2 days

## 2013-01-09 NOTE — Consult Note (Signed)
Reason for Consult:Acute renal failure Referring Physician: Dr. Marylu Lund is an 68 y.o. male.  HPI: He is a patient who has history of diabetes, cross disease, liver cirrhosis and a trial fibrillation presently came with complaints of abdominal pain. During evaluation patient was found to have small bowel obstruction status post reduction in emergency room. Presently patient says that his abdominal pain is better. He denies any difficulty in breathing. Patient states that he has some problem with his kidneys. He has also previous history of kidney stone status post ESWL he does not remember which kidney it was. Patient denies any swelling of the legs.  Past Medical History  Diagnosis Date  . Hypertension     negative stress nuclear study in 02/2007; normal coronaries in 2006; Echo in 2006-aortic sclerosis, Mild biatrial enlargement; normal LV  . Pulmonary hypertension     Mild pulmonary hypertension with normal PVR by 2010 cath [facility])  . Hyperlipidemia   . Syncope 2008    orthostatic hypotension  . Diabetes mellitus     insulin; onset in 1999; retinopathy, neuropathy and nephropathy  . Chronic atrial fibrillation     onset in 1995; anticoagulation discontinued in 2006  . Crohn's disease     -lower GI bleed in 08/2002;  Pentasa stopped by Dr. Marty Heck at [facility] ; clinical remission.  . Obesity   . NASH (nonalcoholic steatohepatitis)     cirrhosis with esophageal varices; h/o GI bleeding. EGD-2010 grade 2 esophageal varices-banded with good hemostasis; portal gastropathy, friable gastric mucosa; hiatal hernia.  EGD- recurrent melena one week later showed 2 eschars at previously placed bands, decompressed esophageal varices grade 1-2,four columns, no bleeding stigmata, normal appearing small bowel, pt has had 1 Hep A/B vaccine.    . Esophageal varices     s/p band ligation multiple times, Portal Hypertensive gastropathy;  . S/P TIPS (transjugular intrahepatic portosystemic shunt)      06/2009-TIPS procedure at [facility]  . GI bleed     07/2009-admitted with recurrent upper GI bleed secondary to esophageal varices-banded x3  . Pleural effusion, left     s/p thoracentesis-multiple  . Depression   . Anemia     Iron def requiring parenteral iron or injections in past, followed by Dr. Everardo All; B12 def  . Duodenal ulcer 1980s  . Ascites   . Hiatal hernia 12/02/2009  . Esophageal erosions     consistent with mild erosive reflux esophagitis  . Portal hypertensive gastropathy   . 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 Gala Romney  . Cancer 10/2011  . Squamous cell carcinoma of face   . Pneumonia     long time ago  . Shortness of breath   . GERD (gastroesophageal reflux disease)   . Arthritis     knees & ankles   . S/P radiation therapy 01/07/12 thru 03/21/2012    Dr. Orlene Erm; Right cheek/parotid and neck nodes  . Chronic kidney disease     stage III; creatinine of 1.6 in 8/07, 1.35 and 05/2008  . Nephrolithiasis     cystoscopy with stone extraction in 1990,   . Morbid obesity 09/20/2012  . Coagulopathy 09/20/2012    Past Surgical History  Procedure Laterality Date  . Tips procedure  09/16/09  . 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 Cholecystectomy    . Circumcision  -1992  . Colonoscopy  -2008  Reportedly normal, done in [place]  . Multiple thoracenteses    . Esophagogastroduodenoscopy  12/02/2009    distal esophageal erosions, small hiatal hernia  . Tips procedure  2011  . Colonoscopy  01/26/2011    RMR: adequate preparation.  normal rectum. Somewhat  diffusely friable colonic mucosa. A single 4 mm polyp in the ascending segment. couple of mucosal breaks in the distal 10 cm of terminal ileum with  a couple of erosions at the junction  the TI  and colon.. Tubular adenoma, chronic ileitis. Next TCS 01/2016.  Marland Kitchen Esophagogastroduodenoscopy  01/26/2011    RMR: Decompressed/abscent esophagel  varices s/p TIPS. small hh. GAVE. Procedure: ESOPHAGOGASTRODUODENOSCOPY (EGD);  Surgeon: Daneil Dolin, MD;  [facility];  Service: Endoscopy;  Laterality: N/A;  . Esophagogastroduodenoscopy  04/22/2011    RMR: GAVE.-status post APC ablation as described above  . Esophagogastroduodenoscopy  05/20/2011    RMR: Portal gastropathy.  GAVE - status post APC ablation  . Hot hemostasis  05/20/2011    Procedure: HOT HEMOSTASIS (ARGON PLASMA COAGULATION/BICAP);  Surgeon: Daneil Dolin, MD;  Location[facility];  Service: Endoscopy;  Laterality: N/A;  . Esophagogastroduodenoscopy  07/14/2005    Persisting two columns of grade 2 esophageal varices with one cherry red spot on one of these columns distally.  Otherwise normal  . Skin cancer excision  Oct 2013    large lesion rt side of face  . Cardiac catheterization       04/12/2008 [facility]): no significant CAD, mild pulmonary hypertension with normal PVR, normal pulmonary capillary wedge pressure, normal left ventricular end-diastolic pressure.  . Multiple extractions with alveoloplasty  01/14/2012    Procedure: MULTIPLE EXTRACION WITH ALVEOLOPLASTY;  Surgeon: Lenn Cal, DDS;  Location: [place];  Service: Oral Surgery;  Laterality: N/A;  Extraction of tooth #'s [phone] with alveoloplasty and gross debridement of remaining teeth    Family History  Problem Relation Age of Onset  . Colon cancer Neg Hx   . Inflammatory bowel disease Neg Hx   . Liver disease Neg Hx   . Anesthesia problems Neg Hx   . Hypotension Neg Hx   . Malignant hyperthermia Neg Hx   . Pseudochol deficiency Neg Hx   . Heart attack Father   . COPD Mother   . Diabetes Mother   . Hypertension Mother   . Heart failure Mother   . Heart disease Mother   . Hypertension Sister   . Diabetes Sister     Social History:  reports that he has never smoked. He has never used smokeless tobacco. He reports that he does not drink alcohol or use illicit drugs.  Allergies:   Allergies  Allergen Reactions  . Aspirin Other (See Comments)    Causes pt. To bleed too freely  . Codeine Other (See Comments)    Unable to move limbs after taking Codeine Can't function - legs won't move,etc.  . Tricode Ar [Pseudoeph-Chlorphen-Codeine]   . Other Rash    Adhesive tape  . Tape Rash    Medications: I have reviewed the patient's current medications.  Results for orders placed during the hospital encounter of 01/07/13 (from the past 48 hour(s))  GLUCOSE, CAPILLARY     Status: Abnormal   Collection Time    01/07/13  7:52 PM      Result Value Range   Glucose-Capillary 153 (*) 70 - 99 mg/dL  GLUCOSE, CAPILLARY     Status: Abnormal   Collection Time  01/08/13 12:28 AM      Result Value Range   Glucose-Capillary 140 (*) 70 - 99 mg/dL  GLUCOSE, CAPILLARY     Status: Abnormal   Collection Time    01/08/13  3:59 AM      Result Value Range   Glucose-Capillary 148 (*) 70 - 99 mg/dL  BASIC METABOLIC PANEL     Status: Abnormal   Collection Time    01/08/13  5:30 AM      Result Value Range   Sodium 144  137 - 147 mEq/L   Comment: Please note change in reference range.   Potassium 5.4 (*) 3.7 - 5.3 mEq/L   Comment: Please note change in reference range.   Chloride 107  96 - 112 mEq/L   CO2 21  19 - 32 mEq/L   Glucose, Bld 181 (*) 70 - 99 mg/dL   BUN 46 (*) 6 - 23 mg/dL   Creatinine, Ser 3.99 (*) 0.50 - 1.35 mg/dL   Calcium 8.2 (*) 8.4 - 10.5 mg/dL   GFR calc non Af Amer 14 (*) >90 mL/min   GFR calc Af Amer 16 (*) >90 mL/min   Comment: (NOTE)     The eGFR has been calculated using the CKD EPI equation.     This calculation has not been validated in all clinical situations.     eGFR's persistently <90 mL/min signify possible Chronic Kidney     Disease.  CBC     Status: Abnormal   Collection Time    01/08/13  5:30 AM      Result Value Range   WBC 18.9 (*) 4.0 - 10.5 K/uL   RBC 3.17 (*) 4.22 - 5.81 MIL/uL   Hemoglobin 9.7 (*) 13.0 - 17.0 g/dL   HCT 29.4 (*)  39.0 - 52.0 %   MCV 92.7  78.0 - 100.0 fL   MCH 30.6  26.0 - 34.0 pg   MCHC 33.0  30.0 - 36.0 g/dL   RDW 15.0  11.5 - 15.5 %   Platelets 91 (*) 150 - 400 K/uL   Comment: SPECIMEN CHECKED FOR CLOTS     PLATELET COUNT CONFIRMED BY SMEAR  HEMOGLOBIN A1C     Status: Abnormal   Collection Time    01/08/13  5:30 AM      Result Value Range   Hemoglobin A1C 6.7 (*) <5.7 %   Comment: (NOTE)                                                                               According to the ADA Clinical Practice Recommendations for 2011, when     HbA1c is used as a screening test:      >=6.5%   Diagnostic of Diabetes Mellitus               (if abnormal result is confirmed)     5.7-6.4%   Increased risk of developing Diabetes Mellitus     References:Diagnosis and Classification of Diabetes Mellitus,Diabetes     IOEV,0350,09(FGHWE 1):S62-S69 and Standards of Medical Care in             Diabetes - 2011,Diabetes Care,2011,34 (Suppl 1):S11-S61.   Mean Plasma  Glucose 146 (*) <117 mg/dL   Comment: Performed at [facility]  TSH     Status: None   Collection Time    01/08/13  5:30 AM      Result Value Range   TSH 2.229  0.350 - 4.500 uIU/mL   Comment: Performed at [facility]  GLUCOSE, CAPILLARY     Status: Abnormal   Collection Time    01/08/13  7:54 AM      Result Value Range   Glucose-Capillary 158 (*) 70 - 99 mg/dL   Comment 1 Notify RN    URINALYSIS, ROUTINE W REFLEX MICROSCOPIC     Status: Abnormal   Collection Time    01/08/13 11:20 AM      Result Value Range   Color, Urine BROWN (*) YELLOW   Comment: BIOCHEMICALS MAY BE AFFECTED BY COLOR   APPearance HAZY (*) CLEAR   Specific Gravity, Urine >1.030 (*) 1.005 - 1.030   pH 5.0  5.0 - 8.0   Glucose, UA NEGATIVE  NEGATIVE mg/dL   Hgb urine dipstick TRACE (*) NEGATIVE   Bilirubin Urine SMALL (*) NEGATIVE   Ketones, ur NEGATIVE  NEGATIVE mg/dL   Protein, ur 014 (*) NEGATIVE mg/dL   Urobilinogen, UA 0.2  0.0 - 1.0 mg/dL    Nitrite NEGATIVE  NEGATIVE   Leukocytes, UA NEGATIVE  NEGATIVE  URINE MICROSCOPIC-ADD ON     Status: Abnormal   Collection Time    01/08/13 11:20 AM      Result Value Range   WBC, UA 0-2  <3 WBC/hpf   RBC / HPF 0-2  <3 RBC/hpf   Bacteria, UA MANY (*) RARE  GLUCOSE, CAPILLARY     Status: Abnormal   Collection Time    01/08/13 12:17 PM      Result Value Range   Glucose-Capillary 153 (*) 70 - 99 mg/dL   Comment 1 Notify RN    GLUCOSE, CAPILLARY     Status: Abnormal   Collection Time    01/08/13  4:42 PM      Result Value Range   Glucose-Capillary 147 (*) 70 - 99 mg/dL   Comment 1 Notify RN    GLUCOSE, CAPILLARY     Status: Abnormal   Collection Time    01/08/13  8:01 PM      Result Value Range   Glucose-Capillary 169 (*) 70 - 99 mg/dL   Comment 1 Notify RN    GLUCOSE, CAPILLARY     Status: Abnormal   Collection Time    01/09/13 12:15 AM      Result Value Range   Glucose-Capillary 179 (*) 70 - 99 mg/dL   Comment 1 Documented in Chart     Comment 2 Notify RN    GLUCOSE, CAPILLARY     Status: Abnormal   Collection Time    01/09/13  4:27 AM      Result Value Range   Glucose-Capillary 156 (*) 70 - 99 mg/dL   Comment 1 Documented in Chart     Comment 2 Notify RN    CBC     Status: Abnormal   Collection Time    01/09/13  5:40 AM      Result Value Range   WBC 10.4  4.0 - 10.5 K/uL   RBC 2.87 (*) 4.22 - 5.81 MIL/uL   Hemoglobin 8.8 (*) 13.0 - 17.0 g/dL   HCT 15.9 (*) 73.3 - 12.5 %   MCV 92.7  78.0 - 100.0 fL  MCH 30.7  26.0 - 34.0 pg   MCHC 33.1  30.0 - 36.0 g/dL   RDW 15.4  11.5 - 15.5 %   Platelets 58 (*) 150 - 400 K/uL   Comment: DELTA CHECK NOTED     RESULT REPEATED AND VERIFIED     SPECIMEN CHECKED FOR CLOTS     PLATELET COUNT CONFIRMED BY SMEAR  COMPREHENSIVE METABOLIC PANEL     Status: Abnormal   Collection Time    01/09/13  5:40 AM      Result Value Range   Sodium 143  137 - 147 mEq/L   Potassium 4.6  3.7 - 5.3 mEq/L   Chloride 109  96 - 112 mEq/L   CO2  20  19 - 32 mEq/L   Glucose, Bld 171 (*) 70 - 99 mg/dL   BUN 64 (*) 6 - 23 mg/dL   Creatinine, Ser 5.33 (*) 0.50 - 1.35 mg/dL   Calcium 7.3 (*) 8.4 - 10.5 mg/dL   Total Protein 6.5  6.0 - 8.3 g/dL   Albumin 2.3 (*) 3.5 - 5.2 g/dL   AST 13  0 - 37 U/L   ALT 6  0 - 53 U/L   Alkaline Phosphatase 86  39 - 117 U/L   Total Bilirubin 1.5 (*) 0.3 - 1.2 mg/dL   GFR calc non Af Amer 10 (*) >90 mL/min   GFR calc Af Amer 12 (*) >90 mL/min   Comment: (NOTE)     The eGFR has been calculated using the CKD EPI equation.     This calculation has not been validated in all clinical situations.     eGFR's persistently <90 mL/min signify possible Chronic Kidney     Disease.  GLUCOSE, CAPILLARY     Status: Abnormal   Collection Time    01/09/13  7:44 AM      Result Value Range   Glucose-Capillary 146 (*) 70 - 99 mg/dL  GLUCOSE, CAPILLARY     Status: Abnormal   Collection Time    01/09/13 11:09 AM      Result Value Range   Glucose-Capillary 136 (*) 70 - 99 mg/dL  AMMONIA     Status: None   Collection Time    01/09/13 12:27 PM      Result Value Range   Ammonia 33  11 - 60 umol/L  GLUCOSE, CAPILLARY     Status: Abnormal   Collection Time    01/09/13  4:28 PM      Result Value Range   Glucose-Capillary 127 (*) 70 - 99 mg/dL    No results found.  Review of Systems  Respiratory: Negative for cough.   Cardiovascular: Negative for orthopnea, leg swelling and PND.  Gastrointestinal: Positive for nausea and abdominal pain. Negative for vomiting and diarrhea.  Neurological: Positive for weakness.   Blood pressure 108/62, pulse 73, temperature 98 F (36.7 C), temperature source Oral, resp. rate 18, height $RemoveBe'6\' 1"'[id number]$  (1.854 m), weight 88.1 kg (194 lb 3.6 oz), SpO2 99.00%. Physical Exam  Constitutional: No distress.  Eyes: No scleral icterus.  Neck: No JVD present.  Cardiovascular: Normal rate and regular rhythm.   Respiratory: No respiratory distress. He has no wheezes. He has no rales.  GI: He  exhibits distension. There is tenderness. There is no rebound.  Musculoskeletal: He exhibits no edema.    Assessment/Plan: Problem #1 acute kidney injury. Presently his BUN and creatinine seems to be progressively increasing. Patient is none oliguric. The etiology for his renal  failure also likely could be ATN versus prerenal. Problem #2 chronic kidney disease colon. His creatinine on 10/21/2009 was 1.3 04/24/2011 was 1.48, increased to 1.6 February 24 14. His last creatinine on 11/09/2012 was 1.91. Hence the patient has underlying chronic renal failure possibly stage III. The etiology could be secondary to focal segmental nephrosclerosis related to his kidney stone/diabetes/liver cirrhosis. Problem #3 small bowel obstruction Problem #4 history of liver cirrhosis Problem #5 history of diabetes Problem #6 status post TIP report of hypertension. Problem #7 hypertension Problem #8 anemia  Plan: Agree with hydration We'll increase his IV fluid to to 135 cc per hour Will start patient on Lasix 100 mg IV twice a day We'll do ultrasound of the kidneys to rule out obstruction. We'll check his UA We'll check his basic metabolic panel, phosphorus, intact PTH.  Lannis Lichtenwalner S 01/09/2013, 5:29 PM

## 2013-01-09 NOTE — Progress Notes (Signed)
Subjective: Patient states he feels better. He does have an NG tube in place. He has minimal abdominal pain.  Objective: Vital signs in last 24 hours: Temp:  [97.5 F (36.4 C)-98.5 F (36.9 C)] 98.2 F (36.8 C) (01/06 0534) Pulse Rate:  [94-110] 94 (01/06 0534) Resp:  [18-20] 18 (01/06 0534) BP: (100-109)/(62-70) 100/62 mmHg (01/06 0534) SpO2:  [99 %-100 %] 99 % (01/06 0534) Weight:  [88.1 kg (194 lb 3.6 oz)] 88.1 kg (194 lb 3.6 oz) (01/06 0534) Last BM Date: 01/06/13  Intake/Output from previous day: 01/05 0701 - 01/06 0700 In: 2622.9 [I.V.:2572.9; IV Piggyback:50] Out: 1610 [Urine:650; Emesis/NG output:900] Intake/Output this shift:    General appearance: alert, cooperative and no distress GI: Soft, nontender, nondistended. Umbilical hernia stable. No right inguinal hernia appreciated. No rigidity noted. Occasional bowel sounds appreciated.  Lab Results:   Recent Labs  01/08/13 0530 01/09/13 0540  WBC 18.9* 10.4  HGB 9.7* 8.8*  HCT 29.4* 26.6*  PLT 91* 58*   BMET  Recent Labs  01/08/13 0530 01/09/13 0540  NA 144 143  K 5.4* 4.6  CL 107 109  CO2 21 20  GLUCOSE 181* 171*  BUN 46* 64*  CREATININE 3.99* 5.33*  CALCIUM 8.2* 7.3*   PT/INR  Recent Labs  01/07/13 1017  LABPROT 16.3*  INR 1.34    Studies/Results: Ct Abdomen Pelvis Wo Contrast  01/07/2013   CLINICAL DATA:  SBO, R inguinal hernia (reduced on ER) SBO, R inguinal hernia (reduced on ER), ER patient with a hx of HTN, diabetes, A-FIB, Crohn's disease, NASH, esophageal varices, hiatal hernia, TIPS procedure, gallbladder surgery, heart cath, thoracic spine surgery.  EXAM: CT ABDOMEN AND PELVIS WITHOUT CONTRAST  TECHNIQUE: Multidetector CT imaging of the abdomen and pelvis was performed following the standard protocol without intravenous contrast.  COMPARISON:  DG ABD ACUTE W/CHEST dated 01/07/2013; US ABDOMEN COMPLETE dated 11/09/2012; CT ENTERO ABD W/CM dated 03/04/2008; CT ENTERO PELVIS W/CM dated  03/04/2008  FINDINGS: There is a small loculated left pleural effusion. There is a left basilar airspace disease which may represent atelectasis versus pneumonia.  The liver is diminutive in size with a nodular contour consistent with cirrhosis. There is no intrahepatic or extrahepatic biliary ductal dilatation. The gallbladder is surgically absent. The spleen is mildly enlarged without focal abnormality. There is a hypodense, fluid attenuating left renal mass most consistent with a cyst. The right kidney, adrenal glands and pancreas are normal. The bladder is unremarkable.  There is small bowel dilatation with multiple small bowel air-fluid levels with an apparent transition zone at the right inguinal hernia.  There is no pneumoperitoneum, pneumatosis, or portal venous gas. There is no abdominal or pelvic free fluid. There is no lymphadenopathy.  The abdominal aorta is normal in caliber with atherosclerosis. There is a TIPS stent present. There are no lytic or sclerotic osseous lesions. There is lumbar spine spondylosis. There is thoracic spine posterior spinal fusion incompletely visualized.  IMPRESSION: 1. Small bowel obstruction with an apparent transition zone and a partially reduced right inguinal hernia. The distal small bowel and colon are decompressed. 2. Cirrhosis with a TIPS stent present. 3. Loculated left pleural effusion with left basilar airspace disease which may represent atelectasis versus pneumonia.   Electronically Signed   By: Kathreen Devoid   On: 01/07/2013 16:41   Dg Abd Acute W/chest  01/07/2013   CLINICAL DATA:  Patient unable to stand or move.  EXAM: ACUTE ABDOMEN SERIES (ABDOMEN 2 VIEW & CHEST 1 VIEW)  COMPARISON:  04/21/2011.  FINDINGS: Chronic opacity in the mid to lower left hemithorax appears similar to prior studies favored to represent chronic scarring and moderate chronic left pleural effusion. Right lung appears clear. Probable skin fold projecting over the lower right hemithorax. No  evidence of pulmonary edema. Heart size is normal. Mediastinal contours are partially obscured on the left, but otherwise within normal limits. Atherosclerosis in the thoracic aorta. Orthopedic fixation hardware in the lower thoracic and upper lumbar spine.  Multiple views of the abdomen are slightly limited by underpenetration. However, there are several dilated loops of gas-filled small bowel in the central abdomen, measuring up to 4.6 cm in diameter. There is a paucity of colonic gas. No frank pneumoperitoneum. Surgical clips and a stent project over the region of the liver, suggesting prior TIPS.  IMPRESSION: 1. Findings, as above, concerning for small bowel obstruction. 2. No pneumoperitoneum at this time. 3. The appearance the chest is similar to prior studies, with chronic moderate left pleural effusion and probable pleuroparenchymal scarring in the base of the left hemithorax. 4. Additional incidental findings, as above.   Electronically Signed   By: Vinnie Langton M.D.   On: 01/07/2013 12:01    Anti-infectives: Anti-infectives   Start     Dose/Rate Route Frequency Ordered Stop   01/08/13 2200  piperacillin-tazobactam (ZOSYN) IVPB 3.375 g     3.375 g 12.5 mL/hr over 240 Minutes Intravenous 3 times per day 01/08/13 1943        Assessment/Plan: Impression: Small bowel obstruction secondary to right inguinal hernia which has since been reduced. Does have an NG tube in place, though on physical exam his abdominal examination is unremarkable. His blood tests look worse than he does. I still can't justify an exploratory laparotomy for his abdomen as he is a high risk surgical candidate. We'll continue following with you.  LOS: 2 days    Shaune Westfall A 01/09/2013

## 2013-01-09 NOTE — Progress Notes (Signed)
Pt was not putting out much urine this shift, bladder scan reveal 406cc urine. Catheter was flushed, but still no urine coming out. I deflated catheter balloon and advanced catheter a bit further, urine out put began, and 400cc of urine was emptied from foley for this shift. Urine continuing to flow. [name]

## 2013-01-09 NOTE — Progress Notes (Signed)
Patient seen and examined.  He has not had any vomiting after NG tube placement. No indication for surgery at this time.  Leukocytosis has improved with addition of zosyn.  Renal function continues to deteriorate despite IV fluid ressuscitation.  Nephrology has been consulted.  He does not appear septic or toxic at this time.  Shanera Meske

## 2013-01-09 NOTE — Care Management Note (Addendum)
    Page 1 of 1   01/17/2013     11:37:24 AM   CARE MANAGEMENT NOTE 01/17/2013  Patient:  DOMENIK, TRICE   Account Number:  [id number]  Date Initiated:  01/09/2013  Documentation initiated by:  Claretha Cooper  Subjective/Objective Assessment:   pt from home where he has [facility] RN and aide. Plans to return home at dc with same services     Action/Plan:   Anticipated DC Date:  01/17/2013   Anticipated DC Plan:  [place]  In-house referral  Clinical Social Worker      DC Planning Services  CM consult      Choice offered to / List presented to:             [place].   Status of service:  Completed, signed off Medicare Important Message given?   (If response is "NO", the following Medicare IM given date fields will be blank) Date Medicare IM given:   Date Additional Medicare IM given:    Discharge Disposition:    Per UR Regulation:    If discussed at Long Length of Stay Meetings, dates discussed:   01/16/2013    Comments:  01/09/13 Robbi [address] Filter BSN CM  01/16/13 Custer Pimenta RN BSN CM Improving but may need SNF for ST Rehab. CSW involved  01/17/13 Ramata Strothman Dellia Nims RN BSN CM LTAC option discussed with pt. Pt adamant about not letting LTAC even come discuss their facility with him. CSW and MD notified.

## 2013-01-10 ENCOUNTER — Inpatient Hospital Stay (HOSPITAL_COMMUNITY): Payer: Medicare Other

## 2013-01-10 LAB — CBC
HCT: 27.4 % — ABNORMAL LOW (ref 39.0–52.0)
Hemoglobin: 9.2 g/dL — ABNORMAL LOW (ref 13.0–17.0)
MCH: 31.2 pg (ref 26.0–34.0)
MCHC: 33.6 g/dL (ref 30.0–36.0)
MCV: 92.9 fL (ref 78.0–100.0)
Platelets: 58 10*3/uL — ABNORMAL LOW (ref 150–400)
RBC: 2.95 MIL/uL — ABNORMAL LOW (ref 4.22–5.81)
RDW: 15.4 % (ref 11.5–15.5)
WBC: 5.5 10*3/uL (ref 4.0–10.5)

## 2013-01-10 LAB — GLUCOSE, CAPILLARY
Glucose-Capillary: 137 mg/dL — ABNORMAL HIGH (ref 70–99)
Glucose-Capillary: 144 mg/dL — ABNORMAL HIGH (ref 70–99)
Glucose-Capillary: 154 mg/dL — ABNORMAL HIGH (ref 70–99)
Glucose-Capillary: 121 mg/dL — ABNORMAL HIGH (ref 70–99)
Glucose-Capillary: 99 mg/dL (ref 70–99)

## 2013-01-10 LAB — BASIC METABOLIC PANEL
BUN: 75 mg/dL — ABNORMAL HIGH (ref 6–23)
CALCIUM: 7.5 mg/dL — AB (ref 8.4–10.5)
CO2: 18 mEq/L — ABNORMAL LOW (ref 19–32)
Chloride: 111 mEq/L (ref 96–112)
Creatinine, Ser: 5.26 mg/dL — ABNORMAL HIGH (ref 0.50–1.35)
GFR calc Af Amer: 12 mL/min — ABNORMAL LOW (ref >90)
GFR, EST NON AFRICAN AMERICAN: 10 mL/min — AB (ref >90)
Glucose, Bld: 152 mg/dL — ABNORMAL HIGH (ref 70–99)
Potassium: 4.5 mEq/L (ref 3.7–5.3)
SODIUM: 146 meq/L (ref 137–147)

## 2013-01-10 LAB — IRON AND TIBC
Iron: 10 ug/dL — ABNORMAL LOW (ref 42–135)
Saturation Ratios: 6 % — ABNORMAL LOW (ref 20–55)
TIBC: 164 ug/dL — ABNORMAL LOW (ref 215–435)
UIBC: 154 ug/dL (ref 125–400)

## 2013-01-10 LAB — FERRITIN: FERRITIN: 139 ng/mL (ref 22–322)

## 2013-01-10 LAB — PTH, INTACT AND CALCIUM
Calcium, Total (PTH): 6.8 mg/dL — ABNORMAL LOW (ref 8.4–10.5)
PTH: 127.5 pg/mL — ABNORMAL HIGH (ref 14.0–72.0)

## 2013-01-10 LAB — PHOSPHORUS: PHOSPHORUS: 4.8 mg/dL — AB (ref 2.3–4.6)

## 2013-01-10 MED ORDER — IOHEXOL 300 MG/ML  SOLN: 50.0000 mL | Freq: Once | INTRAMUSCULAR | Status: DC | PRN

## 2013-01-10 MED ORDER — FUROSEMIDE 10 MG/ML IJ SOLN
80.0000 mg | Freq: Two times a day (BID) | INTRAMUSCULAR | Status: DC
  Administered 2013-01-10 – 2013-01-11 (×2): 80 mg via INTRAVENOUS
  Filled 2013-01-10 (×2): qty 8

## 2013-01-10 MED ORDER — FUROSEMIDE 10 MG/ML IJ SOLN
80.0000 mg | Freq: Once | INTRAMUSCULAR | Status: AC
  Administered 2013-01-10: 80 mg via INTRAVENOUS
  Filled 2013-01-10: qty 8

## 2013-01-10 NOTE — Progress Notes (Signed)
Patient seen and examined. Above note reviewed.  Patient reports having a bowel movement earlier today.  He is not passing flatus.  Repeat CT abdomen does not show any new findings, does show persistent pSBO.  Continue NG tube.  Surgery following.  Renal function not significantly improved.  No hydronephrosis on imaging. On lasix and fluids. Nephrology following. Following urine output.  Metabolic acidosis possibly related to renal failure.  Clinically he does not appear septic or toxic.  Myna Freimark

## 2013-01-10 NOTE — Progress Notes (Signed)
[name]  MRN: [id number]  DOB/AGE: [birth year] 68 y.o.  Primary Care Physician:MCGOUGH,WILLIAM M, MD  Admit date: 01/07/2013  Chief Complaint:  Chief Complaint  Patient presents with  . Abdominal Pain    S-Pt presented on  01/07/2013 with  Chief Complaint  Patient presents with  . Abdominal Pain  .    Pt offers no new complaints  Meds . furosemide  100 mg Intravenous BID  . insulin aspart  0-15 Units Subcutaneous Q4H  . pantoprazole (PROTONIX) IV  40 mg Intravenous Q12H  . piperacillin-tazobactam (ZOSYN)  IV  2.25 g Intravenous Q8H  . sodium chloride  3 mL Intravenous Q12H      Physical Exam: Vital signs in last 24 hours: Temp:  [97.7 F (36.5 C)-98 F (36.7 C)] 97.7 F (36.5 C) (01/07 0420) Pulse Rate:  [73-113] 113 (01/07 0420) Resp:  [18-20] 20 (01/07 0420) BP: (98-114)/(62-75) 114/75 mmHg (01/07 0420) SpO2:  [97 %-99 %] 98 % (01/07 0420) Weight:  [195 lb 15.8 oz (88.9 kg)] 195 lb 15.8 oz (88.9 kg) (01/07 0420) Weight change: 1 lb 12.2 oz (0.8 kg) Last BM Date: 01/06/13  Intake/Output from previous day: 01/06 0701 - 01/07 0700 In: -  Out: 1350 [Urine:1050; Emesis/NG output:300]     Physical Exam: General- pt is awake,alert. Resp- No acute REsp distress, CTA B/L NO Rhonchi CVS- S1S2 regular in rate and rhythm GIT- BS+, soft, tenderness+ EXT- NO LE Edema,NO Cyanosis   Lab Results: CBC  Recent Labs  01/09/13 0540 01/10/13 0544  WBC 10.4 5.5  HGB 8.8* 9.2*  HCT 26.6* 27.4*  PLT 58* 58*    BMET  Recent Labs  01/09/13 0540 01/10/13 0544  NA 143 146  K 4.6 4.5  CL 109 111  CO2 20 18*  GLUCOSE 171* 152*  BUN 64* 75*  CREATININE 5.33* 5.26*  CALCIUM 7.3* 7.5*   Trend Creat 2015   3.0==>3.99==>5.3==>5.26 2014   1.6--2.3 2013   1.3--1.5 2011   1.2--1.6 2010   1.1--1.46 2007    1.6  MICRO No results found for this or any previous visit (from the past 240 hour(s)).    Lab Results  Component Value Date   CALCIUM 7.5*  01/10/2013   PHOS 4.8* 01/10/2013        Impression: 1)Renal  AKI secondary to Prerenal/ATN/Post renal                AKI sec to Hypotension/ACE.               AKI on CKD               AKI now near plateau.               CKD stage 3 .               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                  Nephrolithiasis Hx Present   2)HTN Target Organ damage  CKD Medication- On RAS blockers- now held On Diuretics-Lasix  3)Anemia HGb at goal (9--11)   4)CKD Mineral-Bone Disorder PTH not avail . Secondary Hyperparathyroidism w/u pending  Phosphorus at goal.   5)GI- admitted wit abdo pain sec to obstruction Primary MD following  6)FEN  Normokalemic NOrmonatremic   7)Acid base Co2 at goal     Plan:  As Pt is having NG output Will  Increase IVf to 150 Will decrease lasix to 80mg   Will follow bmet    Irmo S 01/10/2013, 8:45 AM

## 2013-01-10 NOTE — Progress Notes (Signed)
[facility] PROGRESS NOTE  [name] OJJ:[id number] DOB: [birth year] DOA: 01/07/2013 PCP: Leonides Grills, MD  Assessment/Plan: Small bowel obstruction with right inguinal hernia status post hernia reduction in emergency room. NG with large amount dark green drainage. Will monitor conservatively for return of bowel function. Zosyn day #3. He remains afebrile with normal white count. Repeat CT persistent diffuse fluid filled and dilated small bowel up to about  4 cm in maximum diameter. There is contrast material in the low  colonic lumen today suggesting that some of the oral contrast  consumed 3 days ago has migrated through into the colon. As such,  imaging features today would remain compatible with at least a  high-grade partial obstruction. A definite transition zone is not  identified, but is probably in the mid to distal small bowel  today yields  Appreciate surgery consult.   Acute on chronic renal failure. Creatinine trending down slightly. appreciate nephrology consult. decreased urine output. [place] without hydronephrosis. IV fluids increased and lasix decreased per renal. Will monitor.   Atrial fibrillation. Likely exacerbated by dehydration. Continue to hold off on any rate control medication for now, since blood pressure remains borderline but slightly improved. This will be resumed as blood pressure tolerates. Not candidate for anticoagulation due to underlying liver disease and tendency to bleed. Monitor closely   Diabetes. CBG range 137-154. Sliding scale insulin for now. Resume basal insulin once oral intake is consistent.   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 Continues to trend down.   Leukocytosis. resolved. No fever. Likely related to underlying abdominal process. Continue to follow. May also be related to the hemoconcentration   Anemia, chronic, iron deficiency and GI blood loss. Hg slowly trending downward. No signs of bleeding. Baseline hemoglobin  appears to be now 9, therefore he is likely dehydrated.   Hypotension. Stable. Likely related to volume depletion. SBP range100-119 Continue IV fluids. Does not appear to be septic or toxic.   Hyperkalemia: resolved this am. Mild. likely related to #2.  Code Status: full Family Communication: none present Disposition Plan: home when ready   Consultants:  Surgery  nephrology  Procedures:  NG 01/08/13  Antibiotics:  Zosyn 01/08/13>>  HPI/Subjective: Reports some improvement today in abdominal pain  Objective: Filed Vitals:   01/10/13 0420  BP: 114/75  Pulse: 113  Temp: 97.7 F (36.5 C)  Resp: 20    Intake/Output Summary (Last 24 hours) at 01/10/13 1517 Last data filed at 01/10/13 1500  Gross per 24 hour  Intake 4782.17 ml  Output   2100 ml  Net 2682.17 ml   Filed Weights   01/07/13 1900 01/09/13 0534 01/10/13 0420  Weight: 86.4 kg (190 lb 7.6 oz) 88.1 kg (194 lb 3.6 oz) 88.9 kg (195 lb 15.8 oz)    Exam:   General:  Frail, slightly pale chronically ill appearing  Cardiovascular: irregular no MGR no LE edema  Respiratory: normal effort slightly shallow. Clear bilaterally  Abdomen: soft non-distended mild tenderness to palpation  Musculoskeletal: no clubbing or cyanosis   Data Reviewed: Basic Metabolic Panel:  Recent Labs Lab 01/07/13 1017 01/08/13 0530 01/09/13 0540 01/09/13 1754 01/10/13 0544  NA 141 144 143  --  146  K 4.5 5.4* 4.6  --  4.5  CL 103 107 109  --  111  CO2 22 21 20   --  18*  GLUCOSE 190* 181* 171*  --  152*  BUN 37* 46* 64*  --  75*  CREATININE 3.03* 3.99* 5.33*  --  5.26*  CALCIUM 9.2 8.2* 7.3* 6.8* 7.5*  PHOS  --   --   --   --  4.8*   Liver Function Tests:  Recent Labs Lab 01/07/13 1017 01/09/13 0540  AST 16 13  ALT 8 6  ALKPHOS 123* 86  BILITOT 2.0* 1.5*  PROT 8.1 6.5  ALBUMIN 3.1* 2.3*    Recent Labs Lab 01/07/13 1017  LIPASE 26    Recent Labs Lab 01/07/13 1017 01/09/13 1227  AMMONIA 43 33    CBC:  Recent Labs Lab 01/07/13 1017 01/08/13 0530 01/09/13 0540 01/10/13 0544  WBC 12.8* 18.9* 10.4 5.5  NEUTROABS 11.2*  --   --   --   HGB 11.1* 9.7* 8.8* 9.2*  HCT 33.0* 29.4* 26.6* 27.4*  MCV 90.7 92.7 92.7 92.9  PLT 127* 91* 58* 58*   Cardiac Enzymes: No results found for this basename: CKTOTAL, CKMB, CKMBINDEX, TROPONINI,  in the last 168 hours BNP (last 3 results) No results found for this basename: PROBNP,  in the last 8760 hours CBG:  Recent Labs Lab 01/09/13 1628 01/09/13 2227 01/10/13 0418 01/10/13 0747 01/10/13 1210  GLUCAP 127* 107* 137* 144* 154*    No results found for this or any previous visit (from the past 240 hour(s)).   Studies: Ct Abdomen Pelvis Wo Contrast  01/10/2013   CLINICAL DATA:  Esophageal varices. Status post TIPS. GI bleed. Small bowel obstruction.  EXAM: CT ABDOMEN AND PELVIS WITHOUT CONTRAST  TECHNIQUE: Multidetector CT imaging of the abdomen and pelvis was performed following the standard protocol without intravenous contrast.  COMPARISON:  01/07/2013  FINDINGS: Imaging through the lung bases shows persistent left base collapse/consolidation with loculated left pleural effusion.  Liver contour is nodular. The spleen is enlarged. NG tube tip is seen in the distal stomach. Duodenum is unremarkable. Pancreas has normal on infused features. Gallbladder is surgically absent. Portosystemic shunt in the central liver is again noted. No evidence for focal adrenal lesion. Left lower pole renal cyst again noted. No hydronephrosis in either kidney.  No abdominal aortic aneurysm.  No lymphadenopathy in the abdomen.  Diffuse distension of small bowel in the abdomen is again noted. The dilated small bowel loops are fluid filled. Dilated small bowel measures up to 4.0 cm in diameter. Small amount of free fluid is seen in the abdomen.  Imaging through the pelvis shows small volume intraperitoneal free fluid. Colon is nondistended. Contrast material in the  colon is probably from the previous CT scan. The distal ileum and terminal ileum are decompressed. The appendix is normal.  Umbilical hernia contains fat, but no small bowel. A discrete transition zone in the small bowel is not evident on the current exam small bowel is again seen protruding into a shallow right inguinal hernia in wall there was an apparent transition zone in this region on the previous study, there is no definite transition zone in this region today.  Right inguinal hernia contains fat and fluid, but no bowel. Bones are diffusely demineralized with evidence of spinal fusion in the thoracolumbar region.  IMPRESSION: Persistent diffuse fluid filled and dilated small bowel up to about 4 cm in maximum diameter. There is contrast material in the low colonic lumen today suggesting that some of the oral contrast consumed 3 days ago has migrated through into the colon. As such, imaging features today would remain compatible with at least a high-grade partial obstruction. A definite transition zone is not identified, but is probably in the mid to distal  small bowel.   Electronically Signed   By: Misty Stanley M.D.   On: 01/10/2013 12:16   US Renal  01/10/2013   CLINICAL DATA:  Worsening renal failure  EXAM: RENAL/URINARY TRACT ULTRASOUND COMPLETE  COMPARISON:  CT 01/07/2013  FINDINGS: Right Kidney:  Length: 10.7 cm. Echogenicity within normal limits. No mass or hydronephrosis visualized.  Left Kidney:  Length: 11.0 cm. Normal renal cortical thickness and echogenicity. No hydronephrosis. There is a 4.4 cm cyst off the inferior pole of the left kidney.  Bladder:  Decompressed with Foley catheter in place.  IMPRESSION: No hydronephrosis   Electronically Signed   By: Lovey Newcomer M.D.   On: 01/10/2013 08:24    Scheduled Meds: . furosemide  80 mg Intravenous BID  . insulin aspart  0-15 Units Subcutaneous Q4H  . pantoprazole (PROTONIX) IV  40 mg Intravenous Q12H  . piperacillin-tazobactam (ZOSYN)  IV   2.25 g Intravenous Q8H  . sodium chloride  3 mL Intravenous Q12H   Continuous Infusions: . sodium chloride 150 mL/hr at 01/10/13 0944    Principal Problem:   SBO (small bowel obstruction) Active Problems:   Cirrhosis of liver without mention of alcohol   Chronic atrial fibrillation   Chronic kidney disease   NASH (nonalcoholic steatohepatitis)   Anemia   Thrombocytopenia   Diabetes   Dehydration   Acute renal failure   Inguinal hernia   Small bowel obstruction   Malnutrition of moderate degree    Time spent: 35 minutes    [facility] Pager [phone]. If 7PM-7AM, please contact night-coverage at [email or web address], password [facility] 01/10/2013, 3:17 PM  LOS: 3 days

## 2013-01-10 NOTE — Progress Notes (Signed)
  Subjective: Patient denies any significant abdominal pain. Is hungry and wants to eat. He did have small bowel movement.  Objective: Vital signs in last 24 hours: Temp:  [97.7 F (36.5 C)-98 F (36.7 C)] 97.7 F (36.5 C) (01/07 0420) Pulse Rate:  [73-113] 113 (01/07 0420) Resp:  [18-20] 20 (01/07 0420) BP: (98-114)/(62-75) 114/75 mmHg (01/07 0420) SpO2:  [97 %-99 %] 98 % (01/07 0420) Weight:  [88.9 kg (195 lb 15.8 oz)] 88.9 kg (195 lb 15.8 oz) (01/07 0420) Last BM Date: 01/06/13  Intake/Output from previous day: 01/06 0701 - 01/07 0700 In: -  Out: 1350 [Urine:1050; Emesis/NG output:300] Intake/Output this shift:    GI: Soft, flat. No distention noted. No specific tenderness noted. No rigidity is noted. No right inguinal incarceration noted.  Lab Results:   Recent Labs  01/09/13 0540 01/10/13 0544  WBC 10.4 5.5  HGB 8.8* 9.2*  HCT 26.6* 27.4*  PLT 58* 58*   BMET  Recent Labs  01/09/13 0540 01/10/13 0544  NA 143 146  K 4.6 4.5  CL 109 111  CO2 20 18*  GLUCOSE 171* 152*  BUN 64* 75*  CREATININE 5.33* 5.26*  CALCIUM 7.3* 7.5*   PT/INR No results found for this basename: LABPROT, INR,  in the last 72 hours  Studies/Results: US Renal  01/10/2013   CLINICAL DATA:  Worsening renal failure  EXAM: RENAL/URINARY TRACT ULTRASOUND COMPLETE  COMPARISON:  CT 01/07/2013  FINDINGS: Right Kidney:  Length: 10.7 cm. Echogenicity within normal limits. No mass or hydronephrosis visualized.  Left Kidney:  Length: 11.0 cm. Normal renal cortical thickness and echogenicity. No hydronephrosis. There is a 4.4 cm cyst off the inferior pole of the left kidney.  Bladder:  Decompressed with Foley catheter in place.  IMPRESSION: No hydronephrosis   Electronically Signed   By: Lovey Newcomer M.D.   On: 01/10/2013 08:24    Anti-infectives: Anti-infectives   Start     Dose/Rate Route Frequency Ordered Stop   01/09/13 1400  piperacillin-tazobactam (ZOSYN) IVPB 2.25 g     2.25 g 100 mL/hr  over 30 Minutes Intravenous 3 times per day 01/09/13 0953     01/08/13 2200  piperacillin-tazobactam (ZOSYN) IVPB 3.375 g  Status:  Discontinued     3.375 g 12.5 mL/hr over 240 Minutes Intravenous 3 times per day 01/08/13 1943 01/09/13 3329      Assessment/Plan: Impression: Incarcerated right inguinal hernia, resolved. CT scan preliminary results reveal no pneumoperitoneum, free air, or evidence of bowel ischemia. There is no bowel in the right inguinal hernia. He does have a partial small bowel traction, though contrast from a previous CT scan done on January 4 is to the colon. No evidence of a surgical pathology for the patient's worsening creatinine other than the partial bowel obstruction and resultant third spacing of fluid. No need for acute surgical intervention at this time. Will continue to follow with you.  LOS: 3 days    Tiwan Schnitker A 01/10/2013

## 2013-01-11 DIAGNOSIS — E872 Acidosis, unspecified: Secondary | ICD-10-CM | POA: Diagnosis not present

## 2013-01-11 DIAGNOSIS — I959 Hypotension, unspecified: Secondary | ICD-10-CM

## 2013-01-11 DIAGNOSIS — K7460 Unspecified cirrhosis of liver: Secondary | ICD-10-CM

## 2013-01-11 DIAGNOSIS — N189 Chronic kidney disease, unspecified: Secondary | ICD-10-CM

## 2013-01-11 DIAGNOSIS — E119 Type 2 diabetes mellitus without complications: Secondary | ICD-10-CM

## 2013-01-11 LAB — CBC
HEMATOCRIT: 26.3 % — AB (ref 39.0–52.0)
Hemoglobin: 8.9 g/dL — ABNORMAL LOW (ref 13.0–17.0)
MCH: 31.3 pg (ref 26.0–34.0)
MCHC: 33.8 g/dL (ref 30.0–36.0)
MCV: 92.6 fL (ref 78.0–100.0)
PLATELETS: 73 10*3/uL — AB (ref 150–400)
RBC: 2.84 MIL/uL — ABNORMAL LOW (ref 4.22–5.81)
RDW: 15.4 % (ref 11.5–15.5)
WBC: 5.7 10*3/uL (ref 4.0–10.5)

## 2013-01-11 LAB — GLUCOSE, CAPILLARY
GLUCOSE-CAPILLARY: 137 mg/dL — AB (ref 70–99)
GLUCOSE-CAPILLARY: 156 mg/dL — AB (ref 70–99)
GLUCOSE-CAPILLARY: 163 mg/dL — AB (ref 70–99)
Glucose-Capillary: 113 mg/dL — ABNORMAL HIGH (ref 70–99)
Glucose-Capillary: 115 mg/dL — ABNORMAL HIGH (ref 70–99)
Glucose-Capillary: 117 mg/dL — ABNORMAL HIGH (ref 70–99)
Glucose-Capillary: 148 mg/dL — ABNORMAL HIGH (ref 70–99)

## 2013-01-11 LAB — BASIC METABOLIC PANEL
BUN: 79 mg/dL — ABNORMAL HIGH (ref 6–23)
CHLORIDE: 109 meq/L (ref 96–112)
CO2: 19 meq/L (ref 19–32)
CREATININE: 5.34 mg/dL — AB (ref 0.50–1.35)
Calcium: 7.7 mg/dL — ABNORMAL LOW (ref 8.4–10.5)
GFR calc Af Amer: 12 mL/min — ABNORMAL LOW (ref >90)
GFR calc non Af Amer: 10 mL/min — ABNORMAL LOW (ref >90)
Glucose, Bld: 129 mg/dL — ABNORMAL HIGH (ref 70–99)
Potassium: 4.1 mEq/L (ref 3.7–5.3)
SODIUM: 145 meq/L (ref 137–147)

## 2013-01-11 MED ORDER — DIGOXIN 0.25 MG/ML IJ SOLN
0.2500 mg | Freq: Four times a day (QID) | INTRAMUSCULAR | Status: AC
  Administered 2013-01-11 – 2013-01-12 (×2): 0.25 mg via INTRAVENOUS
  Filled 2013-01-11: qty 2

## 2013-01-11 MED ORDER — MILK AND MOLASSES ENEMA
1.0000 | Freq: Once | RECTAL | Status: AC
  Administered 2013-01-12: 250 mL via RECTAL

## 2013-01-11 MED ORDER — DIGOXIN 0.25 MG/ML IJ SOLN
INTRAMUSCULAR | Status: AC
  Filled 2013-01-11: qty 2

## 2013-01-11 MED ORDER — FUROSEMIDE 10 MG/ML IJ SOLN
100.0000 mg | Freq: Two times a day (BID) | INTRAVENOUS | Status: DC
  Administered 2013-01-11 – 2013-01-14 (×7): 100 mg via INTRAVENOUS
  Filled 2013-01-11 (×8): qty 10

## 2013-01-11 NOTE — Progress Notes (Signed)
ANTIBIOTIC CONSULT NOTE  Pharmacy Consult for Zosyn Indication: Abdominal Infection  Allergies  Allergen Reactions  . Aspirin Other (See Comments)    Causes pt. To bleed too freely  . Codeine Other (See Comments)    Unable to move limbs after taking Codeine Can't function - legs won't move,etc.  . Tricode Ar [Pseudoeph-Chlorphen-Codeine]   . Other Rash    Adhesive tape  . Tape Rash   Patient Measurements: Height: 6\' 1"  (185.4 cm) Weight: 195 lb 15.8 oz (88.9 kg) IBW/kg (Calculated) : 79.9  Vital Signs: Temp: 97.9 F (36.6 C) (01/08 0423) Temp src: Oral (01/08 0423) BP: 94/69 mmHg (01/08 0423) Pulse Rate: 112 (01/08 0423) Intake/Output from previous day: 01/07 0701 - 01/08 0700 In: 4782.2 [I.V.:4782.2] Out: 1550 [Urine:1200; Emesis/NG output:350] Intake/Output from this shift:    Labs:  Recent Labs  01/09/13 0540 01/10/13 0544 01/11/13 0549  WBC 10.4 5.5 5.7  HGB 8.8* 9.2* 8.9*  PLT 58* 58* 73*  CREATININE 5.33* 5.26* 5.34*   Estimated Creatinine Clearance: 15.2 ml/min (by C-G formula based on Cr of 5.34). No results found for this basename: VANCOTROUGH, VANCOPEAK, VANCORANDOM, GENTTROUGH, GENTPEAK, GENTRANDOM, TOBRATROUGH, TOBRAPEAK, TOBRARND, AMIKACINPEAK, AMIKACINTROU, AMIKACIN,  in the last 72 hours   Microbiology: No results found for this or any previous visit (from the past 720 hour(s)).  Medical History: Past Medical History  Diagnosis Date  . Hypertension     negative stress nuclear study in 02/2007; normal coronaries in 2006; Echo in 2006-aortic sclerosis, Mild biatrial enlargement; normal LV  . Pulmonary hypertension     Mild pulmonary hypertension with normal PVR by 2010 cath [facility])  . Hyperlipidemia   . Syncope 2008    orthostatic hypotension  . Diabetes mellitus     insulin; onset in 1999; retinopathy, neuropathy and nephropathy  . Chronic atrial fibrillation     onset in 1995; anticoagulation discontinued in 2006  . Crohn's disease    -lower GI bleed in 08/2002;  Pentasa stopped by Dr. Marty Heck at [facility] ; clinical remission.  . Obesity   . NASH (nonalcoholic steatohepatitis)     cirrhosis with esophageal varices; h/o GI bleeding. EGD-2010 grade 2 esophageal varices-banded with good hemostasis; portal gastropathy, friable gastric mucosa; hiatal hernia.  EGD- recurrent melena one week later showed 2 eschars at previously placed bands, decompressed esophageal varices grade 1-2,four columns, no bleeding stigmata, normal appearing small bowel, pt has had 1 Hep A/B vaccine.    . Esophageal varices     s/p band ligation multiple times, Portal Hypertensive gastropathy;  . S/P TIPS (transjugular intrahepatic portosystemic shunt)     06/2009-TIPS procedure at [facility]  . GI bleed     07/2009-admitted with recurrent upper GI bleed secondary to esophageal varices-banded x3  . Pleural effusion, left     s/p thoracentesis-multiple  . Depression   . Anemia     Iron def requiring parenteral iron or injections in past, followed by Dr. Everardo All; B12 def  . Duodenal ulcer 1980s  . Ascites   . Hiatal hernia 12/02/2009  . Esophageal erosions     consistent with mild erosive reflux esophagitis  . Portal hypertensive gastropathy   . 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 Gala Romney  . Cancer 10/2011  . Squamous cell carcinoma of face   . Pneumonia     long time ago  . Shortness of breath   . GERD (gastroesophageal reflux disease)   . Arthritis  knees & ankles   . S/P radiation therapy 01/07/12 thru 03/21/2012    Dr. Orlene Erm; Right cheek/parotid and neck nodes  . Chronic kidney disease     stage III; creatinine of 1.6 in 8/07, 1.35 and 05/2008  . Nephrolithiasis     cystoscopy with stone extraction in 1990,   . Morbid obesity 09/20/2012  . Coagulopathy 09/20/2012   Medications:  Scheduled:  . furosemide  80 mg Intravenous BID  . insulin aspart  0-15 Units Subcutaneous Q4H  . pantoprazole (PROTONIX) IV  40 mg Intravenous  Q12H  . piperacillin-tazobactam (ZOSYN)  IV  2.25 g Intravenous Q8H  . sodium chloride  3 mL Intravenous Q12H   Assessment: Okay for Protocol, ABX for abdominal infection.  SCr remains elevated.  Estimated Creatinine Clearance: 15.2 ml/min (by C-G formula based on Cr of 5.34).   Pt is afebrile.  Zosyn 1/5 >>  Goal of Therapy:  Eradicate infection.  Plan:  Continue Zosyn 2.25gm IV q8h Follow-up micro data, labs, vitals. Follow up culture results  Hart Robinsons A 01/11/2013,9:50 AM

## 2013-01-11 NOTE — Consult Note (Signed)
Primary cardiologist: Former patient Dr Lattie Haw Consulting cardiologist: Dr. Carlyle Dolly  Clinical Summary [name] is a 68 y.o.male history of HTN, hyperlipidemia, DM, afib on anticoagulation, Crohns disease, NASH with esophageal varices admitted with abdominal pain found to have small bowel obstruction being managed by the primary medicine team. The patient also developed acute on chronic renal failure that is being followed by nephrology. Cardiology is consulted for assistance with management of permanent atrial fibrillation. He has not bee on anticoagulation due to history of liver disease and esophageal varices. He has been on digoxin orally at home for sometime per notes. Since admission he has been NPO, started on clear liquid diet but was not able to keep it down this morning.   Allergies  Allergen Reactions  . Aspirin Other (See Comments)    Causes pt. To bleed too freely  . Codeine Other (See Comments)    Unable to move limbs after taking Codeine Can't function - legs won't move,etc.  . Tricode Ar [Pseudoeph-Chlorphen-Codeine]   . Other Rash    Adhesive tape  . Tape Rash    Medications Scheduled Medications: . furosemide  100 mg Intravenous BID  . insulin aspart  0-15 Units Subcutaneous Q4H  . pantoprazole (PROTONIX) IV  40 mg Intravenous Q12H  . piperacillin-tazobactam (ZOSYN)  IV  2.25 g Intravenous Q8H  . sodium chloride  3 mL Intravenous Q12H     Infusions: . sodium chloride 1,000 mL (01/11/13 0035)     PRN Medications:  acetaminophen, acetaminophen, morphine injection, ondansetron (ZOFRAN) IV, ondansetron, promethazine   Past Medical History  Diagnosis Date  . Hypertension     negative stress nuclear study in 02/2007; normal coronaries in 2006; Echo in 2006-aortic sclerosis, Mild biatrial enlargement; normal LV  . Pulmonary hypertension     Mild pulmonary hypertension with normal PVR by 2010 cath [facility])  . Hyperlipidemia   . Syncope 2008   orthostatic hypotension  . Diabetes mellitus     insulin; onset in 1999; retinopathy, neuropathy and nephropathy  . Chronic atrial fibrillation     onset in 1995; anticoagulation discontinued in 2006  . Crohn's disease     -lower GI bleed in 08/2002;  Pentasa stopped by Dr. Marty Heck at [facility] ; clinical remission.  . Obesity   . NASH (nonalcoholic steatohepatitis)     cirrhosis with esophageal varices; h/o GI bleeding. EGD-2010 grade 2 esophageal varices-banded with good hemostasis; portal gastropathy, friable gastric mucosa; hiatal hernia.  EGD- recurrent melena one week later showed 2 eschars at previously placed bands, decompressed esophageal varices grade 1-2,four columns, no bleeding stigmata, normal appearing small bowel, pt has had 1 Hep A/B vaccine.    . Esophageal varices     s/p band ligation multiple times, Portal Hypertensive gastropathy;  . S/P TIPS (transjugular intrahepatic portosystemic shunt)     06/2009-TIPS procedure at [facility]  . GI bleed     07/2009-admitted with recurrent upper GI bleed secondary to esophageal varices-banded x3  . Pleural effusion, left     s/p thoracentesis-multiple  . Depression   . Anemia     Iron def requiring parenteral iron or injections in past, followed by Dr. Everardo All; B12 def  . Duodenal ulcer 1980s  . Ascites   . Hiatal hernia 12/02/2009  . Esophageal erosions     consistent with mild erosive reflux esophagitis  . Portal hypertensive gastropathy   . 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 Gala Romney  .  Cancer 10/2011  . Squamous cell carcinoma of face   . Pneumonia     long time ago  . Shortness of breath   . GERD (gastroesophageal reflux disease)   . Arthritis     knees & ankles   . S/P radiation therapy 01/07/12 thru 03/21/2012    Dr. Orlene Erm; Right cheek/parotid and neck nodes  . Chronic kidney disease     stage III; creatinine of 1.6 in 8/07, 1.35 and 05/2008  . Nephrolithiasis     cystoscopy with stone extraction in  1990,   . Morbid obesity 09/20/2012  . Coagulopathy 09/20/2012    Past Surgical History  Procedure Laterality Date  . Tips procedure  09/16/09  . 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 Cholecystectomy    . Circumcision  -1992  . Colonoscopy  -2008    Reportedly normal, done in [place]  . Multiple thoracenteses    . Esophagogastroduodenoscopy  12/02/2009    distal esophageal erosions, small hiatal hernia  . Tips procedure  2011  . Colonoscopy  01/26/2011    RMR: adequate preparation.  normal rectum. Somewhat  diffusely friable colonic mucosa. A single 4 mm polyp in the ascending segment. couple of mucosal breaks in the distal 10 cm of terminal ileum with  a couple of erosions at the junction  the TI  and colon.. Tubular adenoma, chronic ileitis. Next TCS 01/2016.  Marland Kitchen Esophagogastroduodenoscopy  01/26/2011    RMR: Decompressed/abscent esophagel varices s/p TIPS. small hh. GAVE. Procedure: ESOPHAGOGASTRODUODENOSCOPY (EGD);  Surgeon: Daneil Dolin, MD;  [facility];  Service: Endoscopy;  Laterality: N/A;  . Esophagogastroduodenoscopy  04/22/2011    RMR: GAVE.-status post APC ablation as described above  . Esophagogastroduodenoscopy  05/20/2011    RMR: Portal gastropathy.  GAVE - status post APC ablation  . Hot hemostasis  05/20/2011    Procedure: HOT HEMOSTASIS (ARGON PLASMA COAGULATION/BICAP);  Surgeon: Daneil Dolin, MD;  Location: [facility];  Service: Endoscopy;  Laterality: N/A;  . Esophagogastroduodenoscopy  07/14/2005    Persisting two columns of grade 2 esophageal varices with one cherry red spot on one of these columns distally.  Otherwise normal  . Skin cancer excision  Oct 2013    large lesion rt side of face  . Cardiac catheterization       04/12/2008 [facility]): no significant CAD, mild pulmonary hypertension with normal PVR, normal pulmonary capillary wedge pressure, normal left ventricular  end-diastolic pressure.  . Multiple extractions with alveoloplasty  01/14/2012    Procedure: MULTIPLE EXTRACION WITH ALVEOLOPLASTY;  Surgeon: Lenn Cal, DDS;  Location: [place];  Service: Oral Surgery;  Laterality: N/A;  Extraction of tooth #'s [phone] with alveoloplasty and gross debridement of remaining teeth    Family History  Problem Relation Age of Onset  . Colon cancer Neg Hx   . Inflammatory bowel disease Neg Hx   . Liver disease Neg Hx   . Anesthesia problems Neg Hx   . Hypotension Neg Hx   . Malignant hyperthermia Neg Hx   . Pseudochol deficiency Neg Hx   . Heart attack Father   . COPD Mother   . Diabetes Mother   . Hypertension Mother   . Heart failure Mother   . Heart disease Mother   . Hypertension Sister   . Diabetes Sister     Social History Mr. Mannings reports that he has never smoked. He  has never used smokeless tobacco. Mr. Cheslock reports that he does not drink alcohol.  Review of Systems CONSTITUTIONAL: No weight loss, fever, chills, weakness or fatigue.  HEENT: Eyes: No visual loss, blurred vision, double vision or yellow sclerae. No hearing loss, sneezing, congestion, runny nose or sore throat.  SKIN: No rash or itching.  CARDIOVASCULAR: No chest pain, chest pressure or chest discomfort. No palpitations or edema.  RESPIRATORY: No shortness of breath, cough or sputum.  GASTROINTESTINAL: nausea, vomiting, abdominal pain GENITOURINARY: no polyuria, no dysuria NEUROLOGICAL: No headache, dizziness, syncope, paralysis, ataxia, numbness or tingling in the extremities. No change in bowel or bladder control.  MUSCULOSKELETAL: No muscle, back pain, joint pain or stiffness.  HEMATOLOGIC: No anemia, bleeding or bruising.  LYMPHATICS: No enlarged nodes. No history of splenectomy.  PSYCHIATRIC: No history of depression or anxiety.      Physical Examination Blood pressure 94/69, pulse 112, temperature 97.9 F (36.6 C), temperature source Oral, resp. rate  20, height 6\' 1"  (1.854 m), weight 195 lb 15.8 oz (88.9 kg), SpO2 99.00%.  Intake/Output Summary (Last 24 hours) at 01/11/13 1511 Last data filed at 01/11/13 1053  Gross per 24 hour  Intake    120 ml  Output    800 ml  Net   -680 ml    Cardiovascular: irreg, rate [id number], 2/6 systolic murmur at apex.   Respiratory: clear anteriorly  GI: soft, mildly tender  MSK: extremities are warm, no edema  Neuro: no focal deficits  Lab Results  Basic Metabolic Panel:  Recent Labs Lab 01/07/13 1017 01/08/13 0530 01/09/13 0540 01/09/13 1754 01/10/13 0544 01/11/13 0549  NA 141 144 143  --  146 145  K 4.5 5.4* 4.6  --  4.5 4.1  CL 103 107 109  --  111 109  CO2 22 21 20   --  18* 19  GLUCOSE 190* 181* 171*  --  152* 129*  BUN 37* 46* 64*  --  75* 79*  CREATININE 3.03* 3.99* 5.33*  --  5.26* 5.34*  CALCIUM 9.2 8.2* 7.3* 6.8* 7.5* 7.7*  PHOS  --   --   --   --  4.8*  --     Liver Function Tests:  Recent Labs Lab 01/07/13 1017 01/09/13 0540  AST 16 13  ALT 8 6  ALKPHOS 123* 86  BILITOT 2.0* 1.5*  PROT 8.1 6.5  ALBUMIN 3.1* 2.3*    CBC:  Recent Labs Lab 01/07/13 1017 01/08/13 0530 01/09/13 0540 01/10/13 0544 01/11/13 0549  WBC 12.8* 18.9* 10.4 5.5 5.7  NEUTROABS 11.2*  --   --   --   --   HGB 11.1* 9.7* 8.8* 9.2* 8.9*  HCT 33.0* 29.4* 26.6* 27.4* 26.3*  MCV 90.7 92.7 92.7 92.9 92.6  PLT 127* 91* 58* 58* 73*    Cardiac Enzymes: No results found for this basename: CKTOTAL, CKMB, CKMBINDEX, TROPONINI,  in the last 168 hours  BNP: No components found with this basename: POCBNP,    ECG Afib, LAD, LAFB, non-specific conduction delay   Imaging 10/2009 Echo LVEF 60-65%, no WMAs, bicuspid aortic valve with no stenosis. Mild MR, moderate LAE. RV with mildly reduced RV function, mild RA, trivial TR.   Impression/Recommendations  1. Afib - difficult management strategy in this patient with permanent afib who cannot take oral meds, has liver and kidney failure, and  low blood pressures. There are no therapies that will not have risk of either toxicities or side effects. He has had significantly  elevated rates to the 150s on telemetry - will give very conservative IV digoxin load based on GFR of 10. Start IV digoxin 0.25 mg IV x 1 now and then again in 6 hours. Will follow up EKG tomorrow after second dose and digoxin level.   2. Hypotension - will repeat echo, patient had some RV dysfunction from prior echo in 2011. Given his significant liver disease, PA pressures and RV dysfunction may have progressed. - consider holding lasix if feel hypotension related to hypovolemia or if AKI thought related to prerenal.    Carlyle Dolly, M.D., F.A.C.C.

## 2013-01-11 NOTE — Progress Notes (Signed)
Pt would tach up to 140's intermittently throughout night. Dr. Fay Records was informed and stated to watch him and if the tachycardia lasted one minute or greater then to notify him. Pt would only sustain the HR of 140s for a few seconds and would drop back to 110's. Pt safe and asymptomatic during time. Will continue to monitor.

## 2013-01-11 NOTE — Progress Notes (Signed)
Patient seen and examined.  Above note reviewed.   Patient's NG tube was removed today.  Since that time he has tried to take some clear liquids, but has been unable to tolerate.  He has vomited 2 times since NG came out.  If vomiting persists, then NG tube will need to be replaced. Discussed with Dr. Arnoldo Morale and unclear if patient still has bowel obstruction vs. Ileus.  Will give one enema to help stimulate bowel function.  His renal function has not improved yet.  He is continued on lasix and IV fluids.  Continue to monitor urine output.  Patient has also developed rapid atrial fibrillation. His blood pressure is marginal which makes rate control difficult.  Cardiology input appreciated.  He has been given low dose digoxin.  Will continue to monitor.  MEMON,JEHANZEB

## 2013-01-11 NOTE — Progress Notes (Signed)
Subjective: Interval History: has complaints abdominal pain. Patient denies any difficulty breathing. Still he has some nausea..  Objective: Vital signs in last 24 hours: Temp:  [97.7 F (36.5 C)-98.2 F (36.8 C)] 97.9 F (36.6 C) (01/08 0423) Pulse Rate:  [103-112] 112 (01/08 0423) Resp:  [20] 20 (01/08 0423) BP: (94-120)/(59-74) 94/69 mmHg (01/08 0423) SpO2:  [98 %-100 %] 99 % (01/08 0423) Weight:  [88.9 kg (195 lb 15.8 oz)] 88.9 kg (195 lb 15.8 oz) (01/08 0423) Weight change: 0 kg (0 lb)  Intake/Output from previous day: 01/07 0701 - 01/08 0700 In: 4782.2 [I.V.:4782.2] Out: 1550 [Urine:1200; Emesis/NG output:350] Intake/Output this shift:    General appearance: alert, cooperative and no distress Resp: clear to auscultation bilaterally Cardio: regular rate and rhythm, S1, S2 normal, no murmur, click, rub or gallop GI: He has some tenderness on palpation. Abdomen is hypoactive. Extremities: extremities normal, atraumatic, no cyanosis or edema  Lab Results:  Recent Labs  01/10/13 0544 01/11/13 0549  WBC 5.5 5.7  HGB 9.2* 8.9*  HCT 27.4* 26.3*  PLT 58* 73*   BMET:  Recent Labs  01/10/13 0544 01/11/13 0549  NA 146 145  K 4.5 4.1  CL 111 109  CO2 18* 19  GLUCOSE 152* 129*  BUN 75* 79*  CREATININE 5.26* 5.34*  CALCIUM 7.5* 7.7*    Recent Labs  01/09/13 1754  PTH 127.5*   Iron Studies:  Recent Labs  01/10/13 0544  IRON 10*  TIBC 164*  FERRITIN 139    Studies/Results: Ct Abdomen Pelvis Wo Contrast  01/10/2013   CLINICAL DATA:  Esophageal varices. Status post TIPS. GI bleed. Small bowel obstruction.  EXAM: CT ABDOMEN AND PELVIS WITHOUT CONTRAST  TECHNIQUE: Multidetector CT imaging of the abdomen and pelvis was performed following the standard protocol without intravenous contrast.  COMPARISON:  01/07/2013  FINDINGS: Imaging through the lung bases shows persistent left base collapse/consolidation with loculated left pleural effusion.  Liver contour is  nodular. The spleen is enlarged. NG tube tip is seen in the distal stomach. Duodenum is unremarkable. Pancreas has normal on infused features. Gallbladder is surgically absent. Portosystemic shunt in the central liver is again noted. No evidence for focal adrenal lesion. Left lower pole renal cyst again noted. No hydronephrosis in either kidney.  No abdominal aortic aneurysm.  No lymphadenopathy in the abdomen.  Diffuse distension of small bowel in the abdomen is again noted. The dilated small bowel loops are fluid filled. Dilated small bowel measures up to 4.0 cm in diameter. Small amount of free fluid is seen in the abdomen.  Imaging through the pelvis shows small volume intraperitoneal free fluid. Colon is nondistended. Contrast material in the colon is probably from the previous CT scan. The distal ileum and terminal ileum are decompressed. The appendix is normal.  Umbilical hernia contains fat, but no small bowel. A discrete transition zone in the small bowel is not evident on the current exam small bowel is again seen protruding into a shallow right inguinal hernia in wall there was an apparent transition zone in this region on the previous study, there is no definite transition zone in this region today.  Right inguinal hernia contains fat and fluid, but no bowel. Bones are diffusely demineralized with evidence of spinal fusion in the thoracolumbar region.  IMPRESSION: Persistent diffuse fluid filled and dilated small bowel up to about 4 cm in maximum diameter. There is contrast material in the low colonic lumen today suggesting that some of the oral contrast  consumed 3 days ago has migrated through into the colon. As such, imaging features today would remain compatible with at least a high-grade partial obstruction. A definite transition zone is not identified, but is probably in the mid to distal small bowel.   Electronically Signed   By: Misty Stanley M.D.   On: 01/10/2013 12:16   US Renal  01/10/2013    CLINICAL DATA:  Worsening renal failure  EXAM: RENAL/URINARY TRACT ULTRASOUND COMPLETE  COMPARISON:  CT 01/07/2013  FINDINGS: Right Kidney:  Length: 10.7 cm. Echogenicity within normal limits. No mass or hydronephrosis visualized.  Left Kidney:  Length: 11.0 cm. Normal renal cortical thickness and echogenicity. No hydronephrosis. There is a 4.4 cm cyst off the inferior pole of the left kidney.  Bladder:  Decompressed with Foley catheter in place.  IMPRESSION: No hydronephrosis   Electronically Signed   By: Lovey Newcomer M.D.   On: 01/10/2013 08:24    I have reviewed the patient's current medications.  Assessment/Plan: Problem #1 acute kidney injury superimposed on chronic. His BUN and creatinine stable. Presently patient is none oliguric but no significant change on his renal function. Problem #2 diabetes Problem #3 history of liver cirrhosis Problem #4 small bowel obstruction. Presently NG tube has been removed. Problem #5 hypotension: His blood pressure low normal. Problem #6 anemia: As this moment his iron saturation is low however his ferritin is still with in normal range. Hence it seems to be a combination of  anemia  of iron deficiency and  chronic disease. Problem #7 A trial fibrillation  Problem #8 metabolic acidosis. Plan: We'll continue with hydration We'll increase his Lasix to 100 mg IV twice a day. We'll check his basic metabolic panel in the morning.    LOS: 4 days   Jayln Madeira S 01/11/2013,10:24 AM

## 2013-01-11 NOTE — Progress Notes (Signed)
Subjective: It appears patient had NG tube replaced yesterday evening. He is gagging on the tube which is not an appropriate position. It has since been removed. States he has been passing flatus and had a bowel movement.  Objective: Vital signs in last 24 hours: Temp:  [97.7 F (36.5 C)-98.2 F (36.8 C)] 97.9 F (36.6 C) (01/08 0423) Pulse Rate:  [103-112] 112 (01/08 0423) Resp:  [20] 20 (01/08 0423) BP: (94-120)/(59-74) 94/69 mmHg (01/08 0423) SpO2:  [98 %-100 %] 99 % (01/08 0423) Weight:  [88.9 kg (195 lb 15.8 oz)] 88.9 kg (195 lb 15.8 oz) (01/08 0423) Last BM Date: 01/10/13  Intake/Output from previous day: 01/07 0701 - 01/08 0700 In: 4782.2 [I.V.:4782.2] Out: 1550 [Urine:1200; Emesis/NG output:350] Intake/Output this shift:    GI: Soft, no specific tenderness noted. Unchanged from yesterday. Occasional bowel sounds appreciated.  Lab Results:   Recent Labs  01/10/13 0544 01/11/13 0549  WBC 5.5 5.7  HGB 9.2* 8.9*  HCT 27.4* 26.3*  PLT 58* 73*   BMET  Recent Labs  01/10/13 0544 01/11/13 0549  NA 146 145  K 4.5 4.1  CL 111 109  CO2 18* 19  GLUCOSE 152* 129*  BUN 75* 79*  CREATININE 5.26* 5.34*  CALCIUM 7.5* 7.7*   PT/INR No results found for this basename: LABPROT, INR,  in the last 72 hours  Studies/Results: Ct Abdomen Pelvis Wo Contrast  01/10/2013   CLINICAL DATA:  Esophageal varices. Status post TIPS. GI bleed. Small bowel obstruction.  EXAM: CT ABDOMEN AND PELVIS WITHOUT CONTRAST  TECHNIQUE: Multidetector CT imaging of the abdomen and pelvis was performed following the standard protocol without intravenous contrast.  COMPARISON:  01/07/2013  FINDINGS: Imaging through the lung bases shows persistent left base collapse/consolidation with loculated left pleural effusion.  Liver contour is nodular. The spleen is enlarged. NG tube tip is seen in the distal stomach. Duodenum is unremarkable. Pancreas has normal on infused features. Gallbladder is surgically  absent. Portosystemic shunt in the central liver is again noted. No evidence for focal adrenal lesion. Left lower pole renal cyst again noted. No hydronephrosis in either kidney.  No abdominal aortic aneurysm.  No lymphadenopathy in the abdomen.  Diffuse distension of small bowel in the abdomen is again noted. The dilated small bowel loops are fluid filled. Dilated small bowel measures up to 4.0 cm in diameter. Small amount of free fluid is seen in the abdomen.  Imaging through the pelvis shows small volume intraperitoneal free fluid. Colon is nondistended. Contrast material in the colon is probably from the previous CT scan. The distal ileum and terminal ileum are decompressed. The appendix is normal.  Umbilical hernia contains fat, but no small bowel. A discrete transition zone in the small bowel is not evident on the current exam small bowel is again seen protruding into a shallow right inguinal hernia in wall there was an apparent transition zone in this region on the previous study, there is no definite transition zone in this region today.  Right inguinal hernia contains fat and fluid, but no bowel. Bones are diffusely demineralized with evidence of spinal fusion in the thoracolumbar region.  IMPRESSION: Persistent diffuse fluid filled and dilated small bowel up to about 4 cm in maximum diameter. There is contrast material in the low colonic lumen today suggesting that some of the oral contrast consumed 3 days ago has migrated through into the colon. As such, imaging features today would remain compatible with at least a high-grade partial obstruction. A  definite transition zone is not identified, but is probably in the mid to distal small bowel.   Electronically Signed   By: Misty Stanley M.D.   On: 01/10/2013 12:16   US Renal  01/10/2013   CLINICAL DATA:  Worsening renal failure  EXAM: RENAL/URINARY TRACT ULTRASOUND COMPLETE  COMPARISON:  CT 01/07/2013  FINDINGS: Right Kidney:  Length: 10.7 cm.  Echogenicity within normal limits. No mass or hydronephrosis visualized.  Left Kidney:  Length: 11.0 cm. Normal renal cortical thickness and echogenicity. No hydronephrosis. There is a 4.4 cm cyst off the inferior pole of the left kidney.  Bladder:  Decompressed with Foley catheter in place.  IMPRESSION: No hydronephrosis   Electronically Signed   By: Lovey Newcomer M.D.   On: 01/10/2013 08:24    Anti-infectives: Anti-infectives   Start     Dose/Rate Route Frequency Ordered Stop   01/09/13 1400  piperacillin-tazobactam (ZOSYN) IVPB 2.25 g     2.25 g 100 mL/hr over 30 Minutes Intravenous 3 times per day 01/09/13 0953     01/08/13 2200  piperacillin-tazobactam (ZOSYN) IVPB 3.375 g  Status:  Discontinued     3.375 g 12.5 mL/hr over 240 Minutes Intravenous 3 times per day 01/08/13 1943 01/09/13 5102      Assessment/Plan: Impression: Small bowel obstruction, partial, possibly improving. There will be an element of ileus given his azotemia and worsening renal insufficiency. He does not have a surgical abdomen at this time. Will leave the NG tube out and put him on sips of clears.  LOS: 4 days    Yazmina Pareja A 01/11/2013

## 2013-01-11 NOTE — Progress Notes (Signed)
[facility] PROGRESS NOTE  [name] [id number] DOB: 1945/07/21 DOA: 01/07/2013 PCP: Leonides Grills, MD  Assessment/Plan: Small bowel obstruction with right inguinal hernia status post hernia reduction in emergency room. NG discontinued per surgery. Clear liquids started. Pt continues with nausea and small amount emesis.Reports flatus and 1 BM yesterday.  Zosyn day #4. He remains afebrile with normal white count. Repeat CT on 01/10/13 yields persistent diffuse fluid filled and dilated small bowel up to about 4 cm in maximum diameter. There is contrast material in the low colonic lumen today suggesting that some of the oral contrast consumed 3 days ago has migrated through into the colon. As such, imaging features  would remain compatible with at least a high-grade partial obstruction. A definite transition zone is not identified, but is probably in the mid to distal small bowel today yields Appreciate surgery consult.   Acute on chronic renal failure.No improvement in renal function. Appreciate nephrology consult. Decreased urine output. [place] without hydronephrosis. IV fluids at 150/hr and lasix increased per renal. Will monitor.   Atrial fibrillation. HR range 112-130.  Likely exacerbated by dehydration. Continue to hold off on any rate control medication for now, since blood pressure remains soft. Will request cardiology assistance with rate management. Not candidate for anticoagulation due to underlying liver disease and tendency to bleed. Monitor closely   Diabetes. CBG range 115-137. Sliding scale insulin for now. Resume basal insulin once oral intake is consistent.   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 Continues to trend down.  Leukocytosis. resolved. No fever. Likely related to underlying abdominal process. Continue to follow. May also be related to the hemoconcentration   Anemia, chronic, iron deficiency and GI blood loss. Hg slowly trending downward. No signs of  bleeding. Baseline hemoglobin appears to be now 9, therefore he is likely dehydrated.   Hypotension. SBP range 94-109 Continue IV fluids per renal. Does not appear to be septic or toxic.   Hyperkalemia: resolved this am. Mild. likely related to #2.   Code Status: full Family Communication: daughter at bedside Disposition Plan: home when ready   Consultants:  [facility]  [facility] surgery  cardiology  Procedures:  NG 01/08/13>>01/11/13  Antibiotics:  Zosyn 01/08/13>>>  HPI/Subjective: Lying in bed watching TV. Report feeling "no good". Nausea and vomiting after drinking ginger ale. No change in abdominal pain level. Reports feeling like might have BM  Objective: Filed Vitals:   01/11/13 0423  BP: 94/69  Pulse: 112  Temp: 97.9 F (36.6 C)  Resp: 20    Intake/Output Summary (Last 24 hours) at 01/11/13 1347 Last data filed at 01/11/13 1053  Gross per 24 hour  Intake 4902.17 ml  Output   1200 ml  Net 3702.17 ml   Filed Weights   01/09/13 0534 01/10/13 0420 01/11/13 0423  Weight: 88.1 kg (194 lb 3.6 oz) 88.9 kg (195 lb 15.8 oz) 88.9 kg (195 lb 15.8 oz)    Exam:   General:  Thin frail and chronically ill appearing  Cardiovascular: tachycardic regular. No MGR No LE edema  Respiratory: normal effort BS clear to auscultation bilaterally no wheze  Abdomen: flat soft mild tenderness to RLQ. +BS but sluggish  Musculoskeletal: no clubbing or cyanosis   Data Reviewed: Basic Metabolic Panel:  Recent Labs Lab 01/07/13 1017 01/08/13 0530 01/09/13 0540 01/09/13 1754 01/10/13 0544 01/11/13 0549  NA 141 144 143  --  146 145  K 4.5 5.4* 4.6  --  4.5 4.1  CL 103 107 109  --  111 109  CO2 22 21 20   --  18* 19  GLUCOSE 190* 181* 171*  --  152* 129*  BUN 37* 46* 64*  --  75* 79*  CREATININE 3.03* 3.99* 5.33*  --  5.26* 5.34*  CALCIUM 9.2 8.2* 7.3* 6.8* 7.5* 7.7*  PHOS  --   --   --   --  4.8*  --    Liver Function Tests:  Recent Labs Lab 01/07/13 1017  01/09/13 0540  AST 16 13  ALT 8 6  ALKPHOS 123* 86  BILITOT 2.0* 1.5*  PROT 8.1 6.5  ALBUMIN 3.1* 2.3*    Recent Labs Lab 01/07/13 1017  LIPASE 26    Recent Labs Lab 01/07/13 1017 01/09/13 1227  AMMONIA 43 33   CBC:  Recent Labs Lab 01/07/13 1017 01/08/13 0530 01/09/13 0540 01/10/13 0544 01/11/13 0549  WBC 12.8* 18.9* 10.4 5.5 5.7  NEUTROABS 11.2*  --   --   --   --   HGB 11.1* 9.7* 8.8* 9.2* 8.9*  HCT 33.0* 29.4* 26.6* 27.4* 26.3*  MCV 90.7 92.7 92.7 92.9 92.6  PLT 127* 91* 58* 58* 73*   Cardiac Enzymes: No results found for this basename: CKTOTAL, CKMB, CKMBINDEX, TROPONINI,  in the last 168 hours BNP (last 3 results) No results found for this basename: PROBNP,  in the last 8760 hours CBG:  Recent Labs Lab 01/10/13 2021 01/11/13 0044 01/11/13 0421 01/11/13 0751 01/11/13 1228  GLUCAP 99 113* 117* 137* 115*    No results found for this or any previous visit (from the past 240 hour(s)).   Studies: Ct Abdomen Pelvis Wo Contrast  01/10/2013   CLINICAL DATA:  Esophageal varices. Status post TIPS. GI bleed. Small bowel obstruction.  EXAM: CT ABDOMEN AND PELVIS WITHOUT CONTRAST  TECHNIQUE: Multidetector CT imaging of the abdomen and pelvis was performed following the standard protocol without intravenous contrast.  COMPARISON:  01/07/2013  FINDINGS: Imaging through the lung bases shows persistent left base collapse/consolidation with loculated left pleural effusion.  Liver contour is nodular. The spleen is enlarged. NG tube tip is seen in the distal stomach. Duodenum is unremarkable. Pancreas has normal on infused features. Gallbladder is surgically absent. Portosystemic shunt in the central liver is again noted. No evidence for focal adrenal lesion. Left lower pole renal cyst again noted. No hydronephrosis in either kidney.  No abdominal aortic aneurysm.  No lymphadenopathy in the abdomen.  Diffuse distension of small bowel in the abdomen is again noted. The  dilated small bowel loops are fluid filled. Dilated small bowel measures up to 4.0 cm in diameter. Small amount of free fluid is seen in the abdomen.  Imaging through the pelvis shows small volume intraperitoneal free fluid. Colon is nondistended. Contrast material in the colon is probably from the previous CT scan. The distal ileum and terminal ileum are decompressed. The appendix is normal.  Umbilical hernia contains fat, but no small bowel. A discrete transition zone in the small bowel is not evident on the current exam small bowel is again seen protruding into a shallow right inguinal hernia in wall there was an apparent transition zone in this region on the previous study, there is no definite transition zone in this region today.  Right inguinal hernia contains fat and fluid, but no bowel. Bones are diffusely demineralized with evidence of spinal fusion in the thoracolumbar region.  IMPRESSION: Persistent diffuse fluid filled and dilated small bowel up to about 4 cm in maximum diameter. There is  contrast material in the low colonic lumen today suggesting that some of the oral contrast consumed 3 days ago has migrated through into the colon. As such, imaging features today would remain compatible with at least a high-grade partial obstruction. A definite transition zone is not identified, but is probably in the mid to distal small bowel.   Electronically Signed   By: Misty Stanley M.D.   On: 01/10/2013 12:16   US Renal  01/10/2013   CLINICAL DATA:  Worsening renal failure  EXAM: RENAL/URINARY TRACT ULTRASOUND COMPLETE  COMPARISON:  CT 01/07/2013  FINDINGS: Right Kidney:  Length: 10.7 cm. Echogenicity within normal limits. No mass or hydronephrosis visualized.  Left Kidney:  Length: 11.0 cm. Normal renal cortical thickness and echogenicity. No hydronephrosis. There is a 4.4 cm cyst off the inferior pole of the left kidney.  Bladder:  Decompressed with Foley catheter in place.  IMPRESSION: No hydronephrosis    Electronically Signed   By: Lovey Newcomer M.D.   On: 01/10/2013 08:24    Scheduled Meds: . furosemide  100 mg Intravenous BID  . insulin aspart  0-15 Units Subcutaneous Q4H  . pantoprazole (PROTONIX) IV  40 mg Intravenous Q12H  . piperacillin-tazobactam (ZOSYN)  IV  2.25 g Intravenous Q8H  . sodium chloride  3 mL Intravenous Q12H   Continuous Infusions: . sodium chloride 1,000 mL (01/11/13 0035)    Principal Problem:   SBO (small bowel obstruction) Active Problems:   Cirrhosis of liver without mention of alcohol   Chronic atrial fibrillation   Chronic kidney disease   NASH (nonalcoholic steatohepatitis)   Anemia   Thrombocytopenia   Diabetes   Dehydration   Acute renal failure   Inguinal hernia   Small bowel obstruction   Malnutrition of moderate degree   Metabolic acidosis    Time spent: 35 minutes    [facility] Pager [phone]. If 7PM-7AM, please contact night-coverage at [email or web address], password [facility] 01/11/2013, 1:47 PM  LOS: 4 days

## 2013-01-12 DIAGNOSIS — I369 Nonrheumatic tricuspid valve disorder, unspecified: Secondary | ICD-10-CM

## 2013-01-12 DIAGNOSIS — I4891 Unspecified atrial fibrillation: Secondary | ICD-10-CM | POA: Diagnosis not present

## 2013-01-12 DIAGNOSIS — E876 Hypokalemia: Secondary | ICD-10-CM | POA: Diagnosis not present

## 2013-01-12 LAB — BASIC METABOLIC PANEL
BUN: 77 mg/dL — AB (ref 6–23)
CO2: 18 meq/L — AB (ref 19–32)
Calcium: 7.6 mg/dL — ABNORMAL LOW (ref 8.4–10.5)
Chloride: 113 mEq/L — ABNORMAL HIGH (ref 96–112)
Creatinine, Ser: 5.05 mg/dL — ABNORMAL HIGH (ref 0.50–1.35)
GFR calc Af Amer: 12 mL/min — ABNORMAL LOW (ref >90)
GFR calc non Af Amer: 11 mL/min — ABNORMAL LOW (ref >90)
Glucose, Bld: 135 mg/dL — ABNORMAL HIGH (ref 70–99)
POTASSIUM: 3.5 meq/L — AB (ref 3.7–5.3)
SODIUM: 148 meq/L — AB (ref 137–147)

## 2013-01-12 LAB — GLUCOSE, CAPILLARY
GLUCOSE-CAPILLARY: 141 mg/dL — AB (ref 70–99)
GLUCOSE-CAPILLARY: 182 mg/dL — AB (ref 70–99)
Glucose-Capillary: 128 mg/dL — ABNORMAL HIGH (ref 70–99)
Glucose-Capillary: 145 mg/dL — ABNORMAL HIGH (ref 70–99)
Glucose-Capillary: 152 mg/dL — ABNORMAL HIGH (ref 70–99)

## 2013-01-12 LAB — CBC
HCT: 25.8 % — ABNORMAL LOW (ref 39.0–52.0)
Hemoglobin: 8.8 g/dL — ABNORMAL LOW (ref 13.0–17.0)
MCH: 31.3 pg (ref 26.0–34.0)
MCHC: 34.1 g/dL (ref 30.0–36.0)
MCV: 91.8 fL (ref 78.0–100.0)
Platelets: 71 10*3/uL — ABNORMAL LOW (ref 150–400)
RBC: 2.81 MIL/uL — AB (ref 4.22–5.81)
RDW: 15.4 % (ref 11.5–15.5)
WBC: 6.9 10*3/uL (ref 4.0–10.5)

## 2013-01-12 LAB — DIGOXIN LEVEL: Digoxin Level: 1.3 ng/mL (ref 0.8–2.0)

## 2013-01-12 MED ORDER — DIGOXIN 125 MCG PO TABS
0.1250 mg | ORAL_TABLET | ORAL | Status: DC
  Administered 2013-01-13 – 2013-01-21 (×5): 0.125 mg via ORAL
  Filled 2013-01-12 (×5): qty 1

## 2013-01-12 MED ORDER — POTASSIUM CHLORIDE CRYS ER 20 MEQ PO TBCR
40.0000 meq | EXTENDED_RELEASE_TABLET | ORAL | Status: AC
  Administered 2013-01-12 (×2): 40 meq via ORAL
  Filled 2013-01-12 (×2): qty 2

## 2013-01-12 MED ORDER — SODIUM CHLORIDE 0.45 % IV SOLN
INTRAVENOUS | Status: DC
  Administered 2013-01-12 – 2013-01-17 (×12): via INTRAVENOUS

## 2013-01-12 MED ORDER — BISACODYL 10 MG RE SUPP
10.0000 mg | Freq: Once | RECTAL | Status: AC
  Administered 2013-01-12: 10 mg via RECTAL
  Filled 2013-01-12: qty 1

## 2013-01-12 NOTE — Progress Notes (Signed)
Subjective: Interval History: has complaints abdominal pain. Patient denies any difficulty breathing. Still he has some nausea and but no is vomiting  Objective: Vital signs in last 24 hours: Temp:  [97.6 F (36.4 C)] 97.6 F (36.4 C) (01/09 0444) Pulse Rate:  [98-126] 98 (01/09 0444) Resp:  [20] 20 (01/09 0444) BP: (94-110)/(55-56) 110/55 mmHg (01/09 0444) SpO2:  [98 %-100 %] 100 % (01/09 0444) Weight change:   Intake/Output from previous day: 01/08 0701 - 01/09 0700 In: 120 [P.O.:120] Out: 1225 [Urine:1225] Intake/Output this shift:    General appearance: alert, cooperative and no distress Resp: clear to auscultation bilaterally Cardio: regular rate and rhythm, S1, S2 normal, no murmur, click, rub or gallop GI: He has some tenderness on palpation. Abdomen is hypoactive. Extremities: extremities normal, atraumatic, no cyanosis or edema  Lab Results:  Recent Labs  01/11/13 0549 01/12/13 0548  WBC 5.7 6.9  HGB 8.9* 8.8*  HCT 26.3* 25.8*  PLT 73* 71*   BMET:   Recent Labs  01/11/13 0549 01/12/13 0548  NA 145 148*  K 4.1 3.5*  CL 109 113*  CO2 19 18*  GLUCOSE 129* 135*  BUN 79* 77*  CREATININE 5.34* 5.05*  CALCIUM 7.7* 7.6*    Recent Labs  01/09/13 1754  PTH 127.5*   Iron Studies:   Recent Labs  01/10/13 0544  IRON 10*  TIBC 164*  FERRITIN 139    Studies/Results: Ct Abdomen Pelvis Wo Contrast  01/10/2013   CLINICAL DATA:  Esophageal varices. Status post TIPS. GI bleed. Small bowel obstruction.  EXAM: CT ABDOMEN AND PELVIS WITHOUT CONTRAST  TECHNIQUE: Multidetector CT imaging of the abdomen and pelvis was performed following the standard protocol without intravenous contrast.  COMPARISON:  01/07/2013  FINDINGS: Imaging through the lung bases shows persistent left base collapse/consolidation with loculated left pleural effusion.  Liver contour is nodular. The spleen is enlarged. NG tube tip is seen in the distal stomach. Duodenum is unremarkable.  Pancreas has normal on infused features. Gallbladder is surgically absent. Portosystemic shunt in the central liver is again noted. No evidence for focal adrenal lesion. Left lower pole renal cyst again noted. No hydronephrosis in either kidney.  No abdominal aortic aneurysm.  No lymphadenopathy in the abdomen.  Diffuse distension of small bowel in the abdomen is again noted. The dilated small bowel loops are fluid filled. Dilated small bowel measures up to 4.0 cm in diameter. Small amount of free fluid is seen in the abdomen.  Imaging through the pelvis shows small volume intraperitoneal free fluid. Colon is nondistended. Contrast material in the colon is probably from the previous CT scan. The distal ileum and terminal ileum are decompressed. The appendix is normal.  Umbilical hernia contains fat, but no small bowel. A discrete transition zone in the small bowel is not evident on the current exam small bowel is again seen protruding into a shallow right inguinal hernia in wall there was an apparent transition zone in this region on the previous study, there is no definite transition zone in this region today.  Right inguinal hernia contains fat and fluid, but no bowel. Bones are diffusely demineralized with evidence of spinal fusion in the thoracolumbar region.  IMPRESSION: Persistent diffuse fluid filled and dilated small bowel up to about 4 cm in maximum diameter. There is contrast material in the low colonic lumen today suggesting that some of the oral contrast consumed 3 days ago has migrated through into the colon. As such, imaging features today would remain  compatible with at least a high-grade partial obstruction. A definite transition zone is not identified, but is probably in the mid to distal small bowel.   Electronically Signed   By: Misty Stanley M.D.   On: 01/10/2013 12:16    I have reviewed the patient's current medications.  Assessment/Plan: Problem #1 acute kidney injury superimposed on  chronic. His BUN is 77 and creatinine is 5.05 improving.. Presently patient is none oliguric . Problem #2 diabetes Problem #3 history of liver cirrhosis Problem #4 small bowel obstruction. Presently NG tube has been removed. Problem #5 hypotension: His blood pressure low normal. Problem #6 anemia: As this moment his iron saturation is low however his ferritin is still with in normal range. Hence it seems to be a combination of  anemia  of iron deficiency and  chronic disease. Problem #7 A trial fibrillation  Problem #8 metabolic acidosis. Problem#9 hypernatremia Plan: We'll D/C normal saline We'll continue  his Lasix to 100 mg IV twice a day. Start on 1/2 ns at 135 cc/min We'll check his basic metabolic panel in the morning.    LOS: 5 days   Shaneque Merkle S 01/12/2013,8:00 AM

## 2013-01-12 NOTE — Progress Notes (Signed)
Pt tolerated enema well. The full amount was delivered. Pt transferred well to the [facility]. Pt had a moderate sized formed bowel movement. Will continue to monitor.

## 2013-01-12 NOTE — Progress Notes (Signed)
  Subjective: Patient denies abdominal pain. He states he has had a bowel movement with the enema. Still has occasional nausea.  Objective: Vital signs in last 24 hours: Temp:  [97.6 F (36.4 C)] 97.6 F (36.4 C) (01/09 0444) Pulse Rate:  [98-126] 98 (01/09 0915) Resp:  [20] 20 (01/09 0444) BP: (94-110)/(55-56) 110/55 mmHg (01/09 0444) SpO2:  [98 %-100 %] 98 % (01/09 0915) Weight:  [89.2 kg (196 lb 10.4 oz)] 89.2 kg (196 lb 10.4 oz) (01/09 0837) Last BM Date: 01/12/13  Intake/Output from previous day: 01/08 0701 - 01/09 0700 In: 120 [P.O.:120] Out: 1225 [Urine:1225] Intake/Output this shift: Total I/O In: 100 [P.O.:100] Out: -   GI: Soft, occasional bowel sounds. No tenderness noted. No incarcerated right inguinal hernia noted.  Lab Results:   Recent Labs  01/11/13 0549 01/12/13 0548  WBC 5.7 6.9  HGB 8.9* 8.8*  HCT 26.3* 25.8*  PLT 73* 71*   BMET  Recent Labs  01/11/13 0549 01/12/13 0548  NA 145 148*  K 4.1 3.5*  CL 109 113*  CO2 19 18*  GLUCOSE 129* 135*  BUN 79* 77*  CREATININE 5.34* 5.05*  CALCIUM 7.7* 7.6*   PT/INR No results found for this basename: LABPROT, INR,  in the last 72 hours  Studies/Results: No results found.  Anti-infectives: Anti-infectives   Start     Dose/Rate Route Frequency Ordered Stop   01/09/13 1400  piperacillin-tazobactam (ZOSYN) IVPB 2.25 g     2.25 g 100 mL/hr over 30 Minutes Intravenous 3 times per day 01/09/13 0953     01/08/13 2200  piperacillin-tazobactam (ZOSYN) IVPB 3.375 g  Status:  Discontinued     3.375 g 12.5 mL/hr over 240 Minutes Intravenous 3 times per day 01/08/13 1943 01/09/13 9381      Assessment/Plan: Impression: Abdominal examination unchanged. No acute surgical intervention needed. Awaiting echo results. Could patient have a low flow state to the intra-abdominal organs. Renal failure seems to be plateauing. Will continue to follow with you.  LOS: 5 days    Lesleyanne Politte A 01/12/2013

## 2013-01-12 NOTE — Progress Notes (Signed)
[facility] PROGRESS NOTE  [name] [id number] DOB: [birth year] DOA: 01/07/2013 PCP: Leonides Grills, MD  Assessment/Plan: Small bowel obstruction with right inguinal hernia status post hernia reduction in emergency room. NG discontinued per surgery 01/11/13. Clear liquids started. Pt continues with nausea and emesis with po intake. One moderate formed BM 01/11/13 after enema. Zosyn day #5. He remains afebrile with normal white count. Repeat CT on 01/10/13 yields persistent diffuse fluid filled and dilated small bowel up to about 4 cm in maximum diameter. There is contrast material in the low colonic lumen today suggesting that some of the oral contrast consumed 3 days ago has migrated through into the colon. As such, imaging features would remain compatible with at least a high-grade partial obstruction. A definite transition zone is not identified, but is probably in the mid to distal small bowel. Surgery following.    Acute on chronic renal failure. Slow decline in creatinine. Appreciate nephrology consult. Urine output 1225 yesterday. [place] without hydronephrosis. IV fluids changed to 1/2 NS at 135/hr and lasix remains at 100/hr. Will monitor.   Atrial fibrillation with RVR. Improved. HR range 109-112. Likely exacerbated by dehydration. Blood pressure remains soft. Appreciate cardiology assistance with rate management. Not candidate for anticoagulation due to underlying liver disease and tendency to bleed. Dig loading dose and repeat dose given yesterday. Await echo results.  Repeat EKG yields afib rvr LBBB.  Monitor closely   Diabetes. CBG range 128-163. Sliding scale insulin for now. Resume basal insulin once oral intake is consistent.   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 Continues to trend down.   Leukocytosis. resolved. No fever. Likely related to underlying abdominal process. Continue to follow. May also be related to the hemoconcentration   Anemia, chronic, iron  deficiency and GI blood loss. Hg slowly trending downward. No signs of bleeding. Baseline hemoglobin appears to be now 9, therefore he is likely dehydrated.   Hypotension. SBP range 94-109 Continue IV fluids per renal. Does not appear to be septic or toxic.   Hyperkalemia: Potassium 3.5 today. Will replace.  likely related to #2.   Metabolic acidosis: related to above. Fluids changed as above per renal. Will monitor   Code Status: full Family Communication: none present Disposition Plan: per clinical progressio   Consultants:  Cards  Renal  surgery  Procedures:   Antibiotics:  Zosyn 01/08/13>>  HPI/Subjective: Lying in bed eyes closed mouth open. Easily aroused. Reports feeling "no good". Denies pain, some nausea no vomitng  Objective: Filed Vitals:   01/12/13 0915  BP:   Pulse: 98  Temp:   Resp:     Intake/Output Summary (Last 24 hours) at 01/12/13 0924 Last data filed at 01/11/13 1858  Gross per 24 hour  Intake    120 ml  Output   1225 ml  Net  -1105 ml   Filed Weights   01/10/13 0420 01/11/13 0423 01/12/13 0837  Weight: 88.9 kg (195 lb 15.8 oz) 88.9 kg (195 lb 15.8 oz) 89.2 kg (196 lb 10.4 oz)    Exam:   General:  Slightly yellow, frail ill appearing  Cardiovascular: irregularly irregular, no MGR No LE edema  Respiratory: normal effort but shallow. No wheeze or rhonchi  Abdomen: non-distended but rounded. Soft +BS mild tenderness RLQ  Musculoskeletal: no clubbing or cyanosis   Data Reviewed: Basic Metabolic Panel:  Recent Labs Lab 01/08/13 0530 01/09/13 0540 01/09/13 1754 01/10/13 0544 01/11/13 0549 01/12/13 0548  NA 144 143  --  146 145 148*  K 5.4* 4.6  --  4.5 4.1 3.5*  CL 107 109  --  111 109 113*  CO2 21 20  --  18* 19 18*  GLUCOSE 181* 171*  --  152* 129* 135*  BUN 46* 64*  --  75* 79* 77*  CREATININE 3.99* 5.33*  --  5.26* 5.34* 5.05*  CALCIUM 8.2* 7.3* 6.8* 7.5* 7.7* 7.6*  PHOS  --   --   --  4.8*  --   --    Liver  Function Tests:  Recent Labs Lab 01/07/13 1017 01/09/13 0540  AST 16 13  ALT 8 6  ALKPHOS 123* 86  BILITOT 2.0* 1.5*  PROT 8.1 6.5  ALBUMIN 3.1* 2.3*    Recent Labs Lab 01/07/13 1017  LIPASE 26    Recent Labs Lab 01/07/13 1017 01/09/13 1227  AMMONIA 43 33   CBC:  Recent Labs Lab 01/07/13 1017 01/08/13 0530 01/09/13 0540 01/10/13 0544 01/11/13 0549 01/12/13 0548  WBC 12.8* 18.9* 10.4 5.5 5.7 6.9  NEUTROABS 11.2*  --   --   --   --   --   HGB 11.1* 9.7* 8.8* 9.2* 8.9* 8.8*  HCT 33.0* 29.4* 26.6* 27.4* 26.3* 25.8*  MCV 90.7 92.7 92.7 92.9 92.6 91.8  PLT 127* 91* 58* 58* 73* 71*   Cardiac Enzymes: No results found for this basename: CKTOTAL, CKMB, CKMBINDEX, TROPONINI,  in the last 168 hours BNP (last 3 results) No results found for this basename: PROBNP,  in the last 8760 hours CBG:  Recent Labs Lab 01/11/13 1715 01/11/13 2213 01/11/13 2353 01/12/13 0337 01/12/13 0730  GLUCAP 156* 148* 163* 128* 141*    No results found for this or any previous visit (from the past 240 hour(s)).   Studies: Ct Abdomen Pelvis Wo Contrast  01/10/2013   CLINICAL DATA:  Esophageal varices. Status post TIPS. GI bleed. Small bowel obstruction.  EXAM: CT ABDOMEN AND PELVIS WITHOUT CONTRAST  TECHNIQUE: Multidetector CT imaging of the abdomen and pelvis was performed following the standard protocol without intravenous contrast.  COMPARISON:  01/07/2013  FINDINGS: Imaging through the lung bases shows persistent left base collapse/consolidation with loculated left pleural effusion.  Liver contour is nodular. The spleen is enlarged. NG tube tip is seen in the distal stomach. Duodenum is unremarkable. Pancreas has normal on infused features. Gallbladder is surgically absent. Portosystemic shunt in the central liver is again noted. No evidence for focal adrenal lesion. Left lower pole renal cyst again noted. No hydronephrosis in either kidney.  No abdominal aortic aneurysm.  No  lymphadenopathy in the abdomen.  Diffuse distension of small bowel in the abdomen is again noted. The dilated small bowel loops are fluid filled. Dilated small bowel measures up to 4.0 cm in diameter. Small amount of free fluid is seen in the abdomen.  Imaging through the pelvis shows small volume intraperitoneal free fluid. Colon is nondistended. Contrast material in the colon is probably from the previous CT scan. The distal ileum and terminal ileum are decompressed. The appendix is normal.  Umbilical hernia contains fat, but no small bowel. A discrete transition zone in the small bowel is not evident on the current exam small bowel is again seen protruding into a shallow right inguinal hernia in wall there was an apparent transition zone in this region on the previous study, there is no definite transition zone in this region today.  Right inguinal hernia contains fat and fluid, but no bowel. Bones are diffusely demineralized with evidence  of spinal fusion in the thoracolumbar region.  IMPRESSION: Persistent diffuse fluid filled and dilated small bowel up to about 4 cm in maximum diameter. There is contrast material in the low colonic lumen today suggesting that some of the oral contrast consumed 3 days ago has migrated through into the colon. As such, imaging features today would remain compatible with at least a high-grade partial obstruction. A definite transition zone is not identified, but is probably in the mid to distal small bowel.   Electronically Signed   By: Misty Stanley M.D.   On: 01/10/2013 12:16    Scheduled Meds: . furosemide  100 mg Intravenous BID  . insulin aspart  0-15 Units Subcutaneous Q4H  . pantoprazole (PROTONIX) IV  40 mg Intravenous Q12H  . piperacillin-tazobactam (ZOSYN)  IV  2.25 g Intravenous Q8H  . sodium chloride  3 mL Intravenous Q12H   Continuous Infusions: . sodium chloride      Principal Problem:   SBO (small bowel obstruction) Active Problems:   Cirrhosis of  liver without mention of alcohol   Chronic atrial fibrillation   Chronic kidney disease   NASH (nonalcoholic steatohepatitis)   Anemia   Thrombocytopenia   Diabetes   Dehydration   Acute renal failure   Inguinal hernia   Small bowel obstruction   Malnutrition of moderate degree   Metabolic acidosis    Time spent: 35 minutes    [facility]  If 7PM-7AM, please contact night-coverage at [email or web address], password [facility] 01/12/2013, 9:24 AM  LOS: 5 days

## 2013-01-12 NOTE — Progress Notes (Signed)
*  PRELIMINARY RESULTS* Echocardiogram 2D Echocardiogram has been performed.  Follansbee, Hiko 01/12/2013, 11:11 AM

## 2013-01-12 NOTE — Progress Notes (Signed)
Subjective:   Abdominal pain is improving.    Objective:   Temp:  [97.6 F (36.4 C)] 97.6 F (36.4 C) (01/09 0444) Pulse Rate:  [98-126] 98 (01/09 0444) Resp:  [20] 20 (01/09 0444) BP: (94-110)/(55-56) 110/55 mmHg (01/09 0444) SpO2:  [98 %-100 %] 100 % (01/09 0444) Weight:  [196 lb 10.4 oz (89.2 kg)] 196 lb 10.4 oz (89.2 kg) (01/09 0837) Last BM Date: 01/10/13  Filed Weights   01/10/13 0420 01/11/13 0423 01/12/13 0837  Weight: 195 lb 15.8 oz (88.9 kg) 195 lb 15.8 oz (88.9 kg) 196 lb 10.4 oz (89.2 kg)    Intake/Output Summary (Last 24 hours) at 01/12/13 0903 Last data filed at 01/11/13 1858  Gross per 24 hour  Intake    120 ml  Output   1225 ml  Net  -1105 ml    Telemetry: afib, rates 90s to 110s  Exam:  General:NAD  Resp: clear anteriorally  Cardiac: irreg, rate 366, 2/6 systolic murmur at apex  YQ:IHKV tenderness to deep palpation  QQV:ZDGLOVFIEPP are warm, no edema  Neuro: no focal deficits   Lab Results:  Basic Metabolic Panel:  Recent Labs Lab 01/10/13 0544 01/11/13 0549 01/12/13 0548  NA 146 145 148*  K 4.5 4.1 3.5*  CL 111 109 113*  CO2 18* 19 18*  GLUCOSE 152* 129* 135*  BUN 75* 79* 77*  CREATININE 5.26* 5.34* 5.05*  CALCIUM 7.5* 7.7* 7.6*    Liver Function Tests:  Recent Labs Lab 01/07/13 1017 01/09/13 0540  AST 16 13  ALT 8 6  ALKPHOS 123* 86  BILITOT 2.0* 1.5*  PROT 8.1 6.5  ALBUMIN 3.1* 2.3*    CBC:  Recent Labs Lab 01/10/13 0544 01/11/13 0549 01/12/13 0548  WBC 5.5 5.7 6.9  HGB 9.2* 8.9* 8.8*  HCT 27.4* 26.3* 25.8*  MCV 92.9 92.6 91.8  PLT 58* 73* 71*    Cardiac Enzymes: No results found for this basename: CKTOTAL, CKMB, CKMBINDEX, TROPONINI,  in the last 168 hours  BNP: No results found for this basename: PROBNP,  in the last 8760 hours  Coagulation:  Recent Labs Lab 01/07/13 1017  INR 1.34    ECG:   Medications:   Scheduled Medications: . furosemide  100 mg Intravenous BID  . insulin  aspart  0-15 Units Subcutaneous Q4H  . pantoprazole (PROTONIX) IV  40 mg Intravenous Q12H  . piperacillin-tazobactam (ZOSYN)  IV  2.25 g Intravenous Q8H  . sodium chloride  3 mL Intravenous Q12H     Infusions: . sodium chloride       PRN Medications:  acetaminophen, acetaminophen, morphine injection, ondansetron (ZOFRAN) IV, ondansetron, promethazine     Assessment/Plan   1. Afib  - difficult management strategy in this patient with permanent afib who has not been able to  take oral meds, has liver and kidney failure, and low blood pressures. There are no therapies that will not have risk of either toxicities or side effects.  - started on conservative IV digoxin load yesterday, EKG and dig level do not show evidence of toxicity. Rates improved.  - patient is starting to take PO, will start digoxin 0.125mg  every other day as the recommended dose based on his GFR. If hemodyamics improve would consider changing over to low dose metoprolol  - he is not on anticoag due to history of esophageal varices and advanced liver disease.   2. Hypotension  - will repeat echo, patient had some RV dysfunction from prior echo in  2011. Given his significant liver disease, PA pressures and RV dysfunction may have progressed. Follow up echo results - consider holding lasix if feel hypotension related to hypovolemia or if AKI thought related to prerenal.         Carlyle Dolly, M.D., F.A.C.C.

## 2013-01-12 NOTE — Progress Notes (Signed)
Patient seen and examined.  Agree with note as above per Dyanne Carrel, NP  This is a complex patient with multiple medical problems. He has a history of NASH cirrhosis status post TIPS procedure, chronic thrombocytopenia, atrial fibrillation not a candidate for Coumadin due to history of GI bleeding, chronic kidney disease stage III. The patient presents to the hospital with his small bowel obstruction. He was seen by general surgery and it was not felt that surgical intervention is required at this time. NG tube was initially placed but has since been removed. He was started on a clear liquid diet but continues to have nausea and occasional vomiting. His abdominal exam appears benign without any significant tenderness or distention. He has been given enemas as well as suppository to help stimulate bowel function. Appreciate surgery assistance on this case.  Acute on chronic renal failure. Patient has received IV fluids since admission due to concerns of dehydration and hypotension. Unfortunately, his renal function continued to worsen. Nephrology was consulted and has assisted with management. He is currently on IV fluids as well as IV Lasix and renal function has appeared to have plateaued. We'll continue to follow urine output.  Atrial fibrillation with rapid ventricular response. Patient with atrial fibrillation and was also noted to have low blood pressures. This admission is tachycardia difficult. He was seen by cardiology and received a loading dose of digoxin. Echocardiogram is ordered with results pending. His heart rate is now improved. He is on a candidate for anticoagulation due to history of GI bleeding.  NASH cirrhosis. Patient does appear more drowsy/lethargic today. His ammonia level to date has been normal. We will recheck in a.m.   Prognosis is guarded.  Alexzandra Bilton

## 2013-01-13 DIAGNOSIS — E860 Dehydration: Secondary | ICD-10-CM

## 2013-01-13 LAB — CBC
HEMATOCRIT: 25.9 % — AB (ref 39.0–52.0)
Hemoglobin: 8.6 g/dL — ABNORMAL LOW (ref 13.0–17.0)
MCH: 30.3 pg (ref 26.0–34.0)
MCHC: 33.2 g/dL (ref 30.0–36.0)
MCV: 91.2 fL (ref 78.0–100.0)
Platelets: 68 10*3/uL — ABNORMAL LOW (ref 150–400)
RBC: 2.84 MIL/uL — ABNORMAL LOW (ref 4.22–5.81)
RDW: 15.2 % (ref 11.5–15.5)
WBC: 6.4 10*3/uL (ref 4.0–10.5)

## 2013-01-13 LAB — BASIC METABOLIC PANEL
BUN: 72 mg/dL — AB (ref 6–23)
CO2: 21 mEq/L (ref 19–32)
Calcium: 7.6 mg/dL — ABNORMAL LOW (ref 8.4–10.5)
Chloride: 108 mEq/L (ref 96–112)
Creatinine, Ser: 4.8 mg/dL — ABNORMAL HIGH (ref 0.50–1.35)
GFR calc Af Amer: 13 mL/min — ABNORMAL LOW (ref >90)
GFR, EST NON AFRICAN AMERICAN: 11 mL/min — AB (ref >90)
Glucose, Bld: 122 mg/dL — ABNORMAL HIGH (ref 70–99)
POTASSIUM: 4.1 meq/L (ref 3.7–5.3)
Sodium: 142 mEq/L (ref 137–147)

## 2013-01-13 LAB — GLUCOSE, CAPILLARY
GLUCOSE-CAPILLARY: 113 mg/dL — AB (ref 70–99)
GLUCOSE-CAPILLARY: 144 mg/dL — AB (ref 70–99)
GLUCOSE-CAPILLARY: 147 mg/dL — AB (ref 70–99)
GLUCOSE-CAPILLARY: 163 mg/dL — AB (ref 70–99)
GLUCOSE-CAPILLARY: 165 mg/dL — AB (ref 70–99)
Glucose-Capillary: 145 mg/dL — ABNORMAL HIGH (ref 70–99)

## 2013-01-13 LAB — AMMONIA: AMMONIA: 20 umol/L (ref 11–60)

## 2013-01-13 MED ORDER — PANTOPRAZOLE SODIUM 40 MG PO TBEC
40.0000 mg | DELAYED_RELEASE_TABLET | Freq: Two times a day (BID) | ORAL | Status: DC
  Administered 2013-01-13 – 2013-01-14 (×3): 40 mg via ORAL
  Filled 2013-01-13 (×3): qty 1

## 2013-01-13 NOTE — Progress Notes (Signed)
  Subjective: Mild abdominal pain. States he had 2 bowel movements over the past 24 hours.  Objective: Vital signs in last 24 hours: Temp:  [97.4 F (36.3 C)-98 F (36.7 C)] 98 F (36.7 C) (01/10 0415) Pulse Rate:  [96-98] 97 (01/10 0415) Resp:  [12-20] 20 (01/10 0415) BP: (95-110)/(50-66) 95/50 mmHg (01/10 0415) SpO2:  [98 %-100 %] 99 % (01/10 0415) Weight:  [93.4 kg (205 lb 14.6 oz)] 93.4 kg (205 lb 14.6 oz) (01/10 0415) Last BM Date: 01/12/13  Intake/Output from previous day: 01/09 0701 - 01/10 0700 In: 3683 [P.O.:460; I.V.:2493; IV Piggyback:730] Out: 2750 [Urine:2250; Emesis/NG output:500] Intake/Output this shift:    General appearance: alert and cooperative GI: Soft, no rigidity noted. No significant distention noted. Reducible right inguinal hernia noted.  Lab Results:   Recent Labs  01/12/13 0548 01/13/13 0645  WBC 6.9 6.4  HGB 8.8* 8.6*  HCT 25.8* 25.9*  PLT 71* 68*   BMET  Recent Labs  01/12/13 0548 01/13/13 0645  NA 148* 142  K 3.5* 4.1  CL 113* 108  CO2 18* 21  GLUCOSE 135* 122*  BUN 77* 72*  CREATININE 5.05* 4.80*  CALCIUM 7.6* 7.6*   PT/INR No results found for this basename: LABPROT, INR,  in the last 72 hours  Studies/Results: No results found.  Anti-infectives: Anti-infectives   Start     Dose/Rate Route Frequency Ordered Stop   01/09/13 1400  piperacillin-tazobactam (ZOSYN) IVPB 2.25 g     2.25 g 100 mL/hr over 30 Minutes Intravenous 3 times per day 01/09/13 0953     01/08/13 2200  piperacillin-tazobactam (ZOSYN) IVPB 3.375 g  Status:  Discontinued     3.375 g 12.5 mL/hr over 240 Minutes Intravenous 3 times per day 01/08/13 1943 01/09/13 7902      Assessment/Plan: Impression: Partial small bowel obstruction, stable. Plan: Again, patient is a high risk surgical candidate. Will advance to full liquid diet.  LOS: 6 days    Pearla Mckinny A 01/13/2013

## 2013-01-13 NOTE — Progress Notes (Signed)
[facility] PROGRESS NOTE  [name] [id number] DOB: May 17, 1945 DOA: 01/07/2013 PCP: Leonides Grills, MD  Assessment/Plan: Small bowel obstruction with right inguinal hernia status post hernia reduction in emergency room. NG discontinued per surgery 01/11/13. Diet as per surgery to full liquids. He had 2 bowel movements this morning.  One moderate formed BM 01/11/13 after enema. He has intermittent nausea, but no vomiting. He appears to be improving clinically. Dr. Arnoldo Morale favors conservative management as the patient would be very high risk for surgery. Zosyn day #6. He remains afebrile with normal white count. Repeat CT on 01/10/13 yields persistent diffuse fluid filled and dilated small bowel up to about 4 cm in maximum diameter. There is contrast material in the low colonic lumen today suggesting that some of the oral contrast consumed 3 days ago has migrated through into the colon. As such, imaging features would remain compatible with at least a high-grade partial obstruction. A definite transition zone is not identified, but is probably in the mid to distal small bowel.    Acute on chronic renal failure. Slow decline in creatinine. Appreciate nephrology consult. Adjustments in IV fluids and Lasix per nephrology. [place] without hydronephrosis. Will monitor.   Atrial fibrillation with RVR. Improved. HR range 78-93. Likely exacerbated by dehydration. Blood pressure remains soft. Appreciate cardiology assistance with rate management. Not candidate for anticoagulation due to underlying liver disease and tendency to bleed. Status post digoxin loading. Echocardiogram reveals preserved left ventricular systolic function with an ejection fraction of 60%, but with severe biatrial dilatation.  Followup EKG yielded afib rvr LBBB.  Monitor closely. Continue current management.   Diabetes. CBG range 128-163. Sliding scale insulin for now. Resume basal insulin once oral intake is consistent.    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 Continues to trend down.   Leukocytosis. resolved. No fever. Likely related to underlying abdominal process. Continue to follow. May also be related to the hemoconcentration   Anemia, chronic, iron deficiency and GI blood loss. Hg slowly trending downward. No signs of bleeding. Baseline hemoglobin appears to be now 9, therefore he is likely dehydrated.   Hypotension. SBP range 94-109 Continue IV fluids per renal. Does not appear to be septic or toxic.   Hyperkalemia: Potassium 4.1 today, status post supplementation.   Metabolic acidosis: related to above. Resolved for now. Fluids changed as above per renal. Will monitor   Code Status: full Family Communication: none present Disposition Plan: per clinical progressio   Consultants:  Cards  Renal  surgery  Procedures:  2-D echo:Study Conclusions  - Study data: Technically adequate study. - Left ventricle: The cavity size was normal. There was focal basal hypertrophy without dynamic obstruction. Systolic function was normal. The estimated ejection fraction was in the range of 60% to 65%. Wall motion was normal; there were no regional wall motion abnormalities. - Aortic valve: Moderately calcified annulus. Moderately thickened leaflets. - Mitral valve: Moderately calcified annulus. Mildly thickened leaflets . - Left atrium: The atrium was severely dilated. - Right ventricle: The cavity size was mildly dilated. - Right atrium: The atrium was severely dilated. - Tricuspid valve: Moderate regurgitation. - Pulmonary arteries: PA systolic pressure is borderline elevated. PA peak pressure: 53mm Hg (S). - Pericardium, extracardiac: There is fluid collection around the liver consistent with ascites. Transthoracic echocardiography. M-mode, complete 2D, spectral Doppler, and color Doppler. Height: Height: 185.4cm. Height: 73in. Weight: Weight: 88.9kg. Weight: 195.6lb. Body mass  index: BMI: 25.9kg/m^2. Body surface area: BSA: 2.39m^2. Blood pressure: 110/55. Patient status: Inpatient.  Location: Bedside.    Antibiotics:  Zosyn 01/08/13>>  HPI/Subjective: Patient has less abdominal pain and less nausea, but overall, he feels ill. He ate a small amount of pudding for lunch.  Objective: Filed Vitals:   01/13/13 1506  BP: 93/46  Pulse: 93  Temp: 98.4 F (36.9 C)  Resp: 20    Intake/Output Summary (Last 24 hours) at 01/13/13 1805 Last data filed at 01/13/13 1500  Gross per 24 hour  Intake 2970.5 ml  Output   2100 ml  Net  870.5 ml   Filed Weights   01/11/13 0423 01/12/13 0837 01/13/13 0415  Weight: 88.9 kg (195 lb 15.8 oz) 89.2 kg (196 lb 10.4 oz) 93.4 kg (205 lb 14.6 oz)    Exam:   General:  Slightly yellow, frail ill appearing  Cardiovascular: irregularly irregular, no MGR No LE edema  Respiratory: normal effort but shallow. No wheeze or rhonchi  Abdomen: non-distended but rounded. Soft +BS mild tenderness RLQ  Musculoskeletal: no clubbing or cyanosis   Data Reviewed: Basic Metabolic Panel:  Recent Labs Lab 01/09/13 0540 01/09/13 1754 01/10/13 0544 01/11/13 0549 01/12/13 0548 01/13/13 0645  NA 143  --  146 145 148* 142  K 4.6  --  4.5 4.1 3.5* 4.1  CL 109  --  111 109 113* 108  CO2 20  --  18* 19 18* 21  GLUCOSE 171*  --  152* 129* 135* 122*  BUN 64*  --  75* 79* 77* 72*  CREATININE 5.33*  --  5.26* 5.34* 5.05* 4.80*  CALCIUM 7.3* 6.8* 7.5* 7.7* 7.6* 7.6*  PHOS  --   --  4.8*  --   --   --    Liver Function Tests:  Recent Labs Lab 01/07/13 1017 01/09/13 0540  AST 16 13  ALT 8 6  ALKPHOS 123* 86  BILITOT 2.0* 1.5*  PROT 8.1 6.5  ALBUMIN 3.1* 2.3*    Recent Labs Lab 01/07/13 1017  LIPASE 26    Recent Labs Lab 01/07/13 1017 01/09/13 1227 01/13/13 0646  AMMONIA 43 33 20   CBC:  Recent Labs Lab 01/07/13 1017  01/09/13 0540 01/10/13 0544 01/11/13 0549 01/12/13 0548 01/13/13 0645  WBC 12.8*  < >  10.4 5.5 5.7 6.9 6.4  NEUTROABS 11.2*  --   --   --   --   --   --   HGB 11.1*  < > 8.8* 9.2* 8.9* 8.8* 8.6*  HCT 33.0*  < > 26.6* 27.4* 26.3* 25.8* 25.9*  MCV 90.7  < > 92.7 92.9 92.6 91.8 91.2  PLT 127*  < > 58* 58* 73* 71* 68*  < > = values in this interval not displayed. Cardiac Enzymes: No results found for this basename: CKTOTAL, CKMB, CKMBINDEX, TROPONINI,  in the last 168 hours BNP (last 3 results) No results found for this basename: PROBNP,  in the last 8760 hours CBG:  Recent Labs Lab 01/13/13 0045 01/13/13 0417 01/13/13 0724 01/13/13 1124 01/13/13 1628  GLUCAP 147* 145* 113* 144* 163*    No results found for this or any previous visit (from the past 240 hour(s)).   Studies: No results found.  Scheduled Meds: . digoxin  0.125 mg Oral Q48H  . furosemide  100 mg Intravenous BID  . insulin aspart  0-15 Units Subcutaneous Q4H  . pantoprazole  40 mg Oral BID  . piperacillin-tazobactam (ZOSYN)  IV  2.25 g Intravenous Q8H  . sodium chloride  3 mL Intravenous Q12H  Continuous Infusions: . sodium chloride 135 mL/hr at 01/13/13 0448    Principal Problem:   SBO (small bowel obstruction) Active Problems:   Cirrhosis of liver without mention of alcohol   Chronic kidney disease   NASH (nonalcoholic steatohepatitis)   Anemia   Thrombocytopenia   Diabetes   Dehydration   Acute renal failure   Inguinal hernia   Small bowel obstruction   Malnutrition of moderate degree   Metabolic acidosis   Hypokalemia   Atrial fibrillation with rapid ventricular response    Time spent: 35 minutes    [facility]  If 7PM-7AM, please contact night-coverage at [email or web address], password [email or web address] 01/13/2013, 6:05 PM  LOS: 6 days

## 2013-01-13 NOTE — Progress Notes (Signed)
Subjective: Interval History: Patient states that he is feeling better . He has some nausea but no vomiting and he has moved his bowl last night  Objective: Vital signs in last 24 hours: Temp:  [97.4 F (36.3 C)-98 F (36.7 C)] 98 F (36.7 C) (01/10 0415) Pulse Rate:  [96-98] 97 (01/10 0415) Resp:  [12-20] 20 (01/10 0415) BP: (95-110)/(50-66) 95/50 mmHg (01/10 0415) SpO2:  [98 %-100 %] 99 % (01/10 0415) Weight:  [89.2 kg (196 lb 10.4 oz)-93.4 kg (205 lb 14.6 oz)] 93.4 kg (205 lb 14.6 oz) (01/10 0415) Weight change:   Intake/Output from previous day: 01/09 0701 - 01/10 0700 In: 3683 [P.O.:460; I.V.:2493; IV Piggyback:730] Out: 2750 [Urine:2250; Emesis/NG output:500] Intake/Output this shift:    General appearance: alert, cooperative and no distress Resp: clear to auscultation bilaterally Cardio: regular rate and rhythm, S1, S2 normal, no murmur, click, rub or gallop GI: He has some tenderness on palpation. Abdomen is hypoactive. Extremities: extremities normal, atraumatic, no cyanosis or edema  Lab Results:  Recent Labs  01/12/13 0548 01/13/13 0645  WBC 6.9 6.4  HGB 8.8* 8.6*  HCT 25.8* 25.9*  PLT 71* 68*   BMET:   Recent Labs  01/12/13 0548 01/13/13 0645  NA 148* 142  K 3.5* 4.1  CL 113* 108  CO2 18* 21  GLUCOSE 135* 122*  BUN 77* 72*  CREATININE 5.05* 4.80*  CALCIUM 7.6* 7.6*   No results found for this basename: PTH,  in the last 72 hours Iron Studies:  No results found for this basename: IRON, TIBC, TRANSFERRIN, FERRITIN,  in the last 72 hours  Studies/Results: No results found.  I have reviewed the patient's current medications.  Assessment/Plan: Problem #1 acute kidney injury superimposed on chronic. His BUN is 72 and creatinine is 4.8 improving.. Presently patient is none oliguric . Problem #2 diabetes Problem #3 history of liver cirrhosis Problem #4 small bowel obstruction. Seems improving Problem #5 hypotension: His blood pressure low  normal. Problem #6 anemia: As this moment his iron saturation is low however his ferritin is still with in normal range. Hence it seems to be a combination of  anemia  of iron deficiency and  chronic disease. Problem #7 A trial fibrillation  Problem #8 metabolic acidosis. Problem#9 hypernatremia has corrected [id number]#00 Metabolic acidosis co2 is 21 improving Plan:  We'll continue  his Lasix to 100 mg IV twice a day. Start on 1/2 ns at 135 cc/min We'll check his basic metabolic panel in the morning.    LOS: 6 days   Sophi Calligan S 01/13/2013,8:24 AM

## 2013-01-13 NOTE — Progress Notes (Signed)
ANTIBIOTIC CONSULT NOTE  Pharmacy Consult for Zosyn Indication: Abdominal Infection / SBO  Allergies  Allergen Reactions  . Aspirin Other (See Comments)    Causes pt. To bleed too freely  . Codeine Other (See Comments)    Unable to move limbs after taking Codeine Can't function - legs won't move,etc.  . Tricode Ar [Pseudoeph-Chlorphen-Codeine]   . Other Rash    Adhesive tape  . Tape Rash   Patient Measurements: Height: 6\' 1"  (185.4 cm) Weight: 205 lb 14.6 oz (93.4 kg) IBW/kg (Calculated) : 79.9  Vital Signs: Temp: 98 F (36.7 C) (01/10 0415) Temp src: Oral (01/10 0415) BP: 95/50 mmHg (01/10 0415) Pulse Rate: 97 (01/10 0415) Intake/Output from previous day: 01/09 0701 - 01/10 0700 In: 3683 [P.O.:460; I.V.:2493; IV Piggyback:730] Out: 2750 [Urine:2250; Emesis/NG output:500] Intake/Output from this shift:    Labs:  Recent Labs  01/11/13 0549 01/12/13 0548 01/13/13 0645  WBC 5.7 6.9 6.4  HGB 8.9* 8.8* 8.6*  PLT 73* 71* 68*  CREATININE 5.34* 5.05* 4.80*   Estimated Creatinine Clearance: 16.9 ml/min (by C-G formula based on Cr of 4.8). No results found for this basename: VANCOTROUGH, VANCOPEAK, VANCORANDOM, GENTTROUGH, GENTPEAK, GENTRANDOM, TOBRATROUGH, TOBRAPEAK, TOBRARND, AMIKACINPEAK, AMIKACINTROU, AMIKACIN,  in the last 72 hours   Microbiology: No results found for this or any previous visit (from the past 720 hour(s)).  Medical History: Past Medical History  Diagnosis Date  . Hypertension     negative stress nuclear study in 02/2007; normal coronaries in 2006; Echo in 2006-aortic sclerosis, Mild biatrial enlargement; normal LV  . Pulmonary hypertension     Mild pulmonary hypertension with normal PVR by 2010 cath [facility])  . Hyperlipidemia   . Syncope 2008    orthostatic hypotension  . Diabetes mellitus     insulin; onset in 1999; retinopathy, neuropathy and nephropathy  . Chronic atrial fibrillation     onset in 1995; anticoagulation discontinued in 2006   . Crohn's disease     -lower GI bleed in 08/2002;  Pentasa stopped by Dr. Marty Heck at [facility] ; clinical remission.  . Obesity   . NASH (nonalcoholic steatohepatitis)     cirrhosis with esophageal varices; h/o GI bleeding. EGD-2010 grade 2 esophageal varices-banded with good hemostasis; portal gastropathy, friable gastric mucosa; hiatal hernia.  EGD- recurrent melena one week later showed 2 eschars at previously placed bands, decompressed esophageal varices grade 1-2,four columns, no bleeding stigmata, normal appearing small bowel, pt has had 1 Hep A/B vaccine.    . Esophageal varices     s/p band ligation multiple times, Portal Hypertensive gastropathy;  . S/P TIPS (transjugular intrahepatic portosystemic shunt)     06/2009-[facility]IPS procedure at [facility]  . GI bleed     07/2009-admitted with recurrent upper GI bleed secondary to esophageal varices-banded x3  . Pleural effusion, left     s/p thoracentesis-multiple  . Depression   . Anemia     Iron def requiring parenteral iron or injections in past, followed by Dr. Everardo All; B12 def  . Duodenal ulcer 1980s  . Ascites   . Hiatal hernia 12/02/2009  . Esophageal erosions     consistent with mild erosive reflux esophagitis  . Portal hypertensive gastropathy   . 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 Gala Romney  . Cancer 10/2011  . Squamous cell carcinoma of face   . Pneumonia     long time ago  . Shortness of breath   . GERD (gastroesophageal reflux disease)   .  Arthritis     knees & ankles   . S/P radiation therapy 01/07/12 thru 03/21/2012    Dr. Orlene Erm; Right cheek/parotid and neck nodes  . Chronic kidney disease     stage III; creatinine of 1.6 in 8/07, 1.35 and 05/2008  . Nephrolithiasis     cystoscopy with stone extraction in 1990,   . Morbid obesity 09/20/2012  . Coagulopathy 09/20/2012   Medications:  Scheduled:  . digoxin  0.125 mg Oral Q48H  . furosemide  100 mg Intravenous BID  . insulin aspart  0-15 Units  Subcutaneous Q4H  . pantoprazole  40 mg Oral BID  . piperacillin-tazobactam (ZOSYN)  IV  2.25 g Intravenous Q8H  . sodium chloride  3 mL Intravenous Q12H   Assessment: Okay for Protocol, ABX for abdominal infection.  SCr remains elevated.  Estimated Creatinine Clearance: 16.9 ml/min (by C-G formula based on Cr of 4.8).   Pt is afebrile.  Zosyn 1/5 >>  Goal of Therapy:  Eradicate infection.  Plan:  Continue Zosyn 2.25gm IV q8h Follow-up labs, vitals.  Biagio Quint R 01/13/2013,9:48 AM

## 2013-01-14 LAB — BASIC METABOLIC PANEL
BUN: 68 mg/dL — AB (ref 6–23)
CO2: 20 mEq/L (ref 19–32)
CREATININE: 4.65 mg/dL — AB (ref 0.50–1.35)
Calcium: 7.6 mg/dL — ABNORMAL LOW (ref 8.4–10.5)
Chloride: 105 mEq/L (ref 96–112)
GFR, EST AFRICAN AMERICAN: 14 mL/min — AB (ref >90)
GFR, EST NON AFRICAN AMERICAN: 12 mL/min — AB (ref >90)
Glucose, Bld: 131 mg/dL — ABNORMAL HIGH (ref 70–99)
Potassium: 4 mEq/L (ref 3.7–5.3)
Sodium: 139 mEq/L (ref 137–147)

## 2013-01-14 LAB — GLUCOSE, CAPILLARY
GLUCOSE-CAPILLARY: 133 mg/dL — AB (ref 70–99)
Glucose-Capillary: 123 mg/dL — ABNORMAL HIGH (ref 70–99)
Glucose-Capillary: 124 mg/dL — ABNORMAL HIGH (ref 70–99)
Glucose-Capillary: 128 mg/dL — ABNORMAL HIGH (ref 70–99)
Glucose-Capillary: 141 mg/dL — ABNORMAL HIGH (ref 70–99)
Glucose-Capillary: 150 mg/dL — ABNORMAL HIGH (ref 70–99)

## 2013-01-14 LAB — DIGOXIN LEVEL: Digoxin Level: 1.4 ng/mL (ref 0.8–2.0)

## 2013-01-14 MED ORDER — PANTOPRAZOLE SODIUM 40 MG IV SOLR
40.0000 mg | Freq: Two times a day (BID) | INTRAVENOUS | Status: DC
  Administered 2013-01-14 – 2013-01-18 (×9): 40 mg via INTRAVENOUS
  Filled 2013-01-14 (×10): qty 40

## 2013-01-14 MED ORDER — ONDANSETRON HCL 4 MG/2ML IJ SOLN
4.0000 mg | Freq: Four times a day (QID) | INTRAMUSCULAR | Status: DC
  Administered 2013-01-14 – 2013-01-16 (×7): 4 mg via INTRAVENOUS
  Filled 2013-01-14 (×7): qty 2

## 2013-01-14 MED ORDER — ERYTHROMYCIN BASE 250 MG PO TABS
250.0000 mg | ORAL_TABLET | Freq: Three times a day (TID) | ORAL | Status: DC
Start: 2013-01-14 — End: 2013-01-14
  Administered 2013-01-14: 250 mg via ORAL
  Filled 2013-01-14: qty 1

## 2013-01-14 MED ORDER — ONDANSETRON HCL 4 MG/2ML IJ SOLN
4.0000 mg | INTRAMUSCULAR | Status: DC
  Administered 2013-01-14 (×2): 4 mg via INTRAVENOUS
  Filled 2013-01-14 (×2): qty 2

## 2013-01-14 MED ORDER — METOCLOPRAMIDE HCL 5 MG/ML IJ SOLN
5.0000 mg | Freq: Two times a day (BID) | INTRAMUSCULAR | Status: DC
  Administered 2013-01-14 – 2013-01-17 (×7): 5 mg via INTRAVENOUS
  Filled 2013-01-14 (×7): qty 2

## 2013-01-14 NOTE — Progress Notes (Signed)
[facility] PROGRESS NOTE  [name] [id number] DOB: Mar 05, 1945 DOA: 01/07/2013 PCP: Leonides Grills, MD  Assessment/Plan:  Small bowel obstruction with right inguinal hernia status post hernia reduction in emergency room. NG discontinued per surgery 01/11/13. Diet as per surgery to full liquids. He had 2 bowel movements this morning. One moderate formed BM 01/11/13 after enema. Continues with nausea and vomited small amount bilious emesis after drinking water this am. Dr. Arnoldo Morale favors conservative management as the patient would be very high risk for surgery. Zosyn day #7. He remains afebrile with normal white count. Repeat CT on 01/10/13 yields persistent diffuse fluid filled and dilated small bowel up to about 4 cm in maximum diameter. There is contrast material in the low colonic lumen today suggesting that some of the oral contrast consumed 3 days ago has migrated through into the colon. As such, imaging features would remain compatible with at least a high-grade partial obstruction. A definite transition zone is not identified, but is probably in the mid to distal small bowel. Discussed with Dr. Arnoldo Morale. Will change zofran to scheduled every 4 hours and add po erythromycin to hopefully manage nausea and vomiting better and to stimulate GI.  Continue full liquids. Monitor closely  Acute on chronic renal failure. Creatinine continues to slowly trend downward. Appreciate nephrology consult. Adjustments in IV fluids and Lasix per nephrology. [place] without hydronephrosis. Will monitor.   Atrial fibrillation with RVR. HR 78-92 range. Likely exacerbated by dehydration. Blood pressure remains soft. Appreciate cardiology assistance with rate management. Not candidate for anticoagulation due to underlying liver disease and tendency to bleed. Status post digoxin loading. Echocardiogram reveals preserved left ventricular systolic function with an ejection fraction of 60%, but with severe biatrial  dilatation. Followup EKG yielded afib rvr LBBB. Monitor closely. Continue current management.   Diabetes. CBG range 123-160. Contine sliding scale insulin for now. Resume basal insulin once oral intake is consistent.   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 Continues to trend down.   Leukocytosis. resolved. No fever. Likely related to underlying abdominal process. Continue to follow. May also be related to the hemoconcentration   Anemia, chronic, iron deficiency and GI blood loss. Hg slowly trending downward. No signs of bleeding. Baseline hemoglobin appears to be now 9, therefore he is likely dehydrated. Follow closely  Hypotension. SBP range 93-110 Continue IV fluids per renal. Does not appear to be septic or toxic.   Hyperkalemia: Potassium 4.0 today, status post supplementation. Monitor  Metabolic acidosis: related to above. Resolved for now. Fluids changed as above per renal. Will monitor    Code Status: full Family Communication: none present Disposition Plan: as condition warrants   Consultants:  Renal  Cardiology  General surgery  Procedures: 2-D echo:Study Conclusions  - Study data: Technically adequate study. - Left ventricle: The cavity size was normal. There was focal basal hypertrophy without dynamic obstruction. Systolic function was normal. The estimated ejection fraction was in the range of 60% to 65%. Wall motion was normal; there were no regional wall motion abnormalities. - Aortic valve: Moderately calcified annulus. Moderately thickened leaflets. - Mitral valve: Moderately calcified annulus. Mildly thickened leaflets . - Left atrium: The atrium was severely dilated. - Right ventricle: The cavity size was mildly dilated. - Right atrium: The atrium was severely dilated. - Tricuspid valve: Moderate regurgitation. - Pulmonary arteries: PA systolic pressure is borderline elevated. PA peak pressure: 70mm Hg (S). - Pericardium, extracardiac:  There is fluid collection around the liver consistent with ascites. Transthoracic echocardiography.  M-mode, complete 2D, spectral Doppler, and color Doppler. Height: Height: 185.4cm. Height: 73in. Weight: Weight: 88.9kg. Weight: 195.6lb. Body mass index: BMI: 25.9kg/m^2. Body surface area: BSA: 2.69m^2. Blood pressure: 110/55. Patient status: Inpatient. Location: Bedside.  Antibiotics:  Zosyn 01/08/13>>  HPI/Subjective: Sitting up in bed awake alert. Reports feeling "no better but no worse either". Episode vomiting after drinking water this am. No BM since last evening  Objective: Filed Vitals:   01/14/13 0655  BP: 110/68  Pulse: 91  Temp: 97.6 F (36.4 C)  Resp: 20    Intake/Output Summary (Last 24 hours) at 01/14/13 0924 Last data filed at 01/14/13 0900  Gross per 24 hour  Intake    960 ml  Output    200 ml  Net    760 ml   Filed Weights   01/12/13 0837 01/13/13 0415 01/14/13 0500  Weight: 89.2 kg (196 lb 10.4 oz) 93.4 kg (205 lb 14.6 oz) 93.7 kg (206 lb 9.1 oz)    Exam:   General:  Skin slightly yellow, chronically ill appearing, no acute distress  Cardiovascular: irregularly irregular. i hear no murmur gallup or rub. No LE edema  Respiratory: normal effort. Breath sounds clear to ascultation bilaterally. i hear no wheeze or rhonchi  Abdomen: non-distended. Bowel sounds are sluggish. Mild tenderness to palpation right lower quadrant.   Musculoskeletal: joints without erythema or swelling.    Data Reviewed: Basic Metabolic Panel:  Recent Labs Lab 01/10/13 0544 01/11/13 0549 01/12/13 0548 01/13/13 0645 01/14/13 0543  NA 146 145 148* 142 139  K 4.5 4.1 3.5* 4.1 4.0  CL 111 109 113* 108 105  CO2 18* 19 18* 21 20  GLUCOSE 152* 129* 135* 122* 131*  BUN 75* 79* 77* 72* 68*  CREATININE 5.26* 5.34* 5.05* 4.80* 4.65*  CALCIUM 7.5* 7.7* 7.6* 7.6* 7.6*  PHOS 4.8*  --   --   --   --    Liver Function Tests:  Recent Labs Lab 01/07/13 1017 01/09/13 0540   AST 16 13  ALT 8 6  ALKPHOS 123* 86  BILITOT 2.0* 1.5*  PROT 8.1 6.5  ALBUMIN 3.1* 2.3*    Recent Labs Lab 01/07/13 1017  LIPASE 26    Recent Labs Lab 01/07/13 1017 01/09/13 1227 01/13/13 0646  AMMONIA 43 33 20   CBC:  Recent Labs Lab 01/07/13 1017  01/09/13 0540 01/10/13 0544 01/11/13 0549 01/12/13 0548 01/13/13 0645  WBC 12.8*  < > 10.4 5.5 5.7 6.9 6.4  NEUTROABS 11.2*  --   --   --   --   --   --   HGB 11.1*  < > 8.8* 9.2* 8.9* 8.8* 8.6*  HCT 33.0*  < > 26.6* 27.4* 26.3* 25.8* 25.9*  MCV 90.7  < > 92.7 92.9 92.6 91.8 91.2  PLT 127*  < > 58* 58* 73* 71* 68*  < > = values in this interval not displayed. Cardiac Enzymes: No results found for this basename: CKTOTAL, CKMB, CKMBINDEX, TROPONINI,  in the last 168 hours BNP (last 3 results) No results found for this basename: PROBNP,  in the last 8760 hours CBG:  Recent Labs Lab 01/13/13 1628 01/13/13 1933 01/14/13 0009 01/14/13 0402 01/14/13 0727  GLUCAP 163* 165* 150* 128* 123*    No results found for this or any previous visit (from the past 240 hour(s)).   Studies: No results found.  Scheduled Meds: . digoxin  0.125 mg Oral Q48H  . furosemide  100 mg Intravenous BID  .  insulin aspart  0-15 Units Subcutaneous Q4H  . ondansetron (ZOFRAN) IV  4 mg Intravenous Q4H  . pantoprazole  40 mg Oral BID  . piperacillin-tazobactam (ZOSYN)  IV  2.25 g Intravenous Q8H  . sodium chloride  3 mL Intravenous Q12H   Continuous Infusions: . sodium chloride 135 mL/hr at 01/13/13 2029    Principal Problem:   SBO (small bowel obstruction) Active Problems:   Cirrhosis of liver without mention of alcohol   Chronic kidney disease   NASH (nonalcoholic steatohepatitis)   Anemia   Thrombocytopenia   Diabetes   Dehydration   Acute renal failure   Inguinal hernia   Small bowel obstruction   Malnutrition of moderate degree   Metabolic acidosis   Hypokalemia   Atrial fibrillation with rapid ventricular  response    Time spent: 30 minutes    [facility] Pager [phone]. If 7PM-7AM, please contact night-coverage at [email or web address], password [facility] 01/14/2013, 9:24 AM  LOS: 7 days

## 2013-01-14 NOTE — Progress Notes (Signed)
The patient was seen and examined. He was discussed with nurse practitioner, Ms. Renard Hamper. Agree with her assessment and plan. However, when I walked into the patient's room, he had just finished vomiting into a basin. There is large volume emesis that is bilious in color in the basin. He acknowledges ongoing nausea, but this was his first episode of vomiting this morning. He states that his abdomen feels better following the vomiting, but is grossly "sore". He has been unable to tolerate much of his full liquids.  Given his recent large volume emesis, I recommended reinserting the NG tube. The patient was not in agreement with this. For now, will try to hold most of his oral medications and downgrade his diet to clear liquids. Will start IV Reglan at 5 mg every 12 hours for renal dosing. Will decrease Zofran to every 6 hours and discontinue when necessary Phenergan given that he will be receiving both Reglan and Zofran scheduled. Will hold on oral erythromycin as it may precipitate vomiting. We'll change Protonix to IV. Will order a digoxin level to rule out toxicity. Consider narrowing antibiotic therapy tomorrow. He is currently afebrile and his white blood cell count is within normal limits.

## 2013-01-14 NOTE — Progress Notes (Signed)
Subjective: Interval History: Patient complains of abdominal pain and nausea whenever he tries to drink. He has one bowl movement last night.  Objective: Vital signs in last 24 hours: Temp:  [97.6 F (36.4 C)-98.4 F (36.9 C)] 97.6 F (36.4 C) (01/11 0655) Pulse Rate:  [78-93] 91 (01/11 0655) Resp:  [20] 20 (01/11 0655) BP: (93-110)/(46-68) 110/68 mmHg (01/11 0655) SpO2:  [96 %-100 %] 96 % (01/11 0655) Weight:  [93.7 kg (206 lb 9.1 oz)] 93.7 kg (206 lb 9.1 oz) (01/11 0500) Weight change: 4.5 kg (9 lb 14.7 oz)  Intake/Output from previous day: 01/10 0701 - 01/11 0700 In: 1320 [P.O.:1320] Out: 200 [Emesis/NG output:200] Intake/Output this shift:    General appearance: alert, cooperative and no distress Resp: clear to auscultation bilaterally Cardio: regular rate and rhythm, S1, S2 normal, no murmur, click, rub or gallop GI: He has some tenderness on palpation. Abdomen is hypoactive. Extremities: extremities normal, atraumatic, no cyanosis or edema  Lab Results:  Recent Labs  01/12/13 0548 01/13/13 0645  WBC 6.9 6.4  HGB 8.8* 8.6*  HCT 25.8* 25.9*  PLT 71* 68*   BMET:   Recent Labs  01/13/13 0645 01/14/13 0543  NA 142 139  K 4.1 4.0  CL 108 105  CO2 21 20  GLUCOSE 122* 131*  BUN 72* 68*  CREATININE 4.80* 4.65*  CALCIUM 7.6* 7.6*   No results found for this basename: PTH,  in the last 72 hours Iron Studies:  No results found for this basename: IRON, TIBC, TRANSFERRIN, FERRITIN,  in the last 72 hours  Studies/Results: No results found.  I have reviewed the patient's current medications.  Assessment/Plan: Problem #1 acute kidney injury superimposed on chronic. His BUN is 68 and creatinine is 4.65 improving.. Presently patient is none oliguric . Problem #2 diabetes Problem #3 history of liver cirrhosis Problem #4 small bowel obstruction. S/P reduction still complains of nausea today. Problem #5 hypotension: His blood pressure low normal. Problem #6  anemia: As this moment his iron saturation is low however his ferritin is still with in normal range. Hence it seems to be a combination of  anemia  of iron deficiency and  chronic disease. Problem #7 A trial fibrillation  Problem #8 metabolic acidosis. Problem#9 hypernatremia has corrected [id number]#03 Metabolic acidosis co2 is 20 improving Plan:  We'll continue  With hydration. We'll check his basic metabolic panel in the morning.    LOS: 7 days   Ryatt Corsino S 01/14/2013,8:23 AM

## 2013-01-14 NOTE — Progress Notes (Signed)
Pt. Vomited after drinking a cup water. Phenergan given and pt not currently vomiting. Emesis was brown and thin. Will continue to monitor pt.

## 2013-01-14 NOTE — Progress Notes (Signed)
  Subjective: Still with nausea and occasional emesis. No significant bowel movement over past 24 hours.  Objective: Vital signs in last 24 hours: Temp:  [97.6 F (36.4 C)-98.4 F (36.9 C)] 97.6 F (36.4 C) (01/11 0655) Pulse Rate:  [78-93] 91 (01/11 0655) Resp:  [20] 20 (01/11 0655) BP: (93-110)/(46-68) 110/68 mmHg (01/11 0655) SpO2:  [96 %-100 %] 96 % (01/11 0655) Weight:  [93.7 kg (206 lb 9.1 oz)] 93.7 kg (206 lb 9.1 oz) (01/11 0500) Last BM Date: 01/12/13  Intake/Output from previous day: 01/10 0701 - 01/11 0700 In: 1320 [P.O.:1320] Out: 200 [Emesis/NG output:200] Intake/Output this shift:    GI: Soft, occasional bowel sounds appreciated. Not particularly distended. No specific tenderness or rigidity noted. No incarcerated right inguinal hernia.  Lab Results:   Recent Labs  01/12/13 0548 01/13/13 0645  WBC 6.9 6.4  HGB 8.8* 8.6*  HCT 25.8* 25.9*  PLT 71* 68*   BMET  Recent Labs  01/13/13 0645 01/14/13 0543  NA 142 139  K 4.1 4.0  CL 108 105  CO2 21 20  GLUCOSE 122* 131*  BUN 72* 68*  CREATININE 4.80* 4.65*  CALCIUM 7.6* 7.6*   PT/INR No results found for this basename: LABPROT, INR,  in the last 72 hours  Studies/Results: No results found.  Anti-infectives: Anti-infectives   Start     Dose/Rate Route Frequency Ordered Stop   01/14/13 1200  erythromycin (E-MYCIN) tablet 250 mg     250 mg Oral 3 times daily with meals & bedtime 01/14/13 1002     01/09/13 1400  piperacillin-tazobactam (ZOSYN) IVPB 2.25 g     2.25 g 100 mL/hr over 30 Minutes Intravenous 3 times per day 01/09/13 0953     01/08/13 2200  piperacillin-tazobactam (ZOSYN) IVPB 3.375 g  Status:  Discontinued     3.375 g 12.5 mL/hr over 240 Minutes Intravenous 3 times per day 01/08/13 1943 01/09/13 3662      Assessment/Plan: Impression: Partial small bowel obstruction versus ileus secondary to azotemia. His renal function has improved. I also wonder if there is a component of  gastroparesis with this patient with multiple medical problems. Erythromycin has been started in order to possibly stimulate bowel function. He is still a poor surgical candidate. Will continue to follow with you.  LOS: 7 days    [name] A 01/14/2013

## 2013-01-15 LAB — GLUCOSE, CAPILLARY
GLUCOSE-CAPILLARY: 103 mg/dL — AB (ref 70–99)
GLUCOSE-CAPILLARY: 114 mg/dL — AB (ref 70–99)
Glucose-Capillary: 105 mg/dL — ABNORMAL HIGH (ref 70–99)
Glucose-Capillary: 132 mg/dL — ABNORMAL HIGH (ref 70–99)
Glucose-Capillary: 119 mg/dL — ABNORMAL HIGH (ref 70–99)
Glucose-Capillary: 126 mg/dL — ABNORMAL HIGH (ref 70–99)

## 2013-01-15 LAB — BASIC METABOLIC PANEL
BUN: 72 mg/dL — ABNORMAL HIGH (ref 6–23)
CHLORIDE: 104 meq/L (ref 96–112)
CO2: 18 meq/L — AB (ref 19–32)
Calcium: 7.5 mg/dL — ABNORMAL LOW (ref 8.4–10.5)
Creatinine, Ser: 4.77 mg/dL — ABNORMAL HIGH (ref 0.50–1.35)
GFR calc Af Amer: 13 mL/min — ABNORMAL LOW (ref >90)
GFR calc non Af Amer: 11 mL/min — ABNORMAL LOW (ref >90)
Glucose, Bld: 106 mg/dL — ABNORMAL HIGH (ref 70–99)
Potassium: 3.9 mEq/L (ref 3.7–5.3)
SODIUM: 139 meq/L (ref 137–147)

## 2013-01-15 LAB — CBC
HEMATOCRIT: 24.2 % — AB (ref 39.0–52.0)
Hemoglobin: 8.7 g/dL — ABNORMAL LOW (ref 13.0–17.0)
MCH: 32.6 pg (ref 26.0–34.0)
MCHC: 36 g/dL (ref 30.0–36.0)
MCV: 90.6 fL (ref 78.0–100.0)
Platelets: 72 10*3/uL — ABNORMAL LOW (ref 150–400)
RBC: 2.67 MIL/uL — AB (ref 4.22–5.81)
RDW: 14.9 % (ref 11.5–15.5)
WBC: 10.3 10*3/uL (ref 4.0–10.5)

## 2013-01-15 MED ORDER — ERYTHROMYCIN BASE 250 MG PO TBEC
250.0000 mg | DELAYED_RELEASE_TABLET | Freq: Every day | ORAL | Status: DC
  Administered 2013-01-16 – 2013-01-21 (×6): 250 mg via ORAL
  Filled 2013-01-15 (×12): qty 1

## 2013-01-15 MED ORDER — BOOST / RESOURCE BREEZE PO LIQD
1.0000 | Freq: Three times a day (TID) | ORAL | Status: DC
  Administered 2013-01-15 – 2013-01-19 (×5): 1 via ORAL

## 2013-01-15 NOTE — Progress Notes (Signed)
NUTRITION FOLLOW UP  Intervention:   Resource Breeze po TID, each supplement provides 250 kcal and 9 grams of protein.  Consider nutrition support measure (EN or PN) if poor po intake continues  Nutrition Dx:   Inadequate oral intake related to altered GI function as evidenced by SBO, vomiting, wt loss hx; ongoing  Goal:   Pt to meet >/= 90% of their estimated nutrition needs; goal not met  Monitor:   Diet advancement, tolerance, weight changes and meal intake  Assessment:   NG tube was d/c on 01/11/13. Per MD notes, pt is a poor surgical candidate. Nausea, vomiting, and poor po intake continue. Abdominal pain improving. Poor tolerance of diet and unable to take many PO meds.  Noted Resource Breeze was d/c. Will order again for improved nutritional intake.  Noted an 8.4% wt gain x 1 week, likely due to fluid retention.   Height: Ht Readings from Last 1 Encounters:  01/07/13 $RemoveB'6\' 1"'zazOxAAO$  (1.854 m)    Weight Status:   Wt Readings from Last 1 Encounters:  01/15/13 206 lb 9.1 oz (93.7 kg)    Re-estimated needs:  Kcal: 1700-1800 daily Protein: 94-117 grams daily Fluid: 1.7-1.8 L daily  Skin: WDL  Diet Order: Full Liquid   Intake/Output Summary (Last 24 hours) at 01/15/13 1143 Last data filed at 01/15/13 0500  Gross per 24 hour  Intake    200 ml  Output   2475 ml  Net  -2275 ml    Last BM: 01/12/13   Labs:   Recent Labs Lab 01/10/13 0544  01/13/13 0645 01/14/13 0543 01/15/13 0530  NA 146  < > 142 139 139  K 4.5  < > 4.1 4.0 3.9  CL 111  < > 108 105 104  CO2 18*  < > 21 20 18*  BUN 75*  < > 72* 68* 72*  CREATININE 5.26*  < > 4.80* 4.65* 4.77*  CALCIUM 7.5*  < > 7.6* 7.6* 7.5*  PHOS 4.8*  --   --   --   --   GLUCOSE 152*  < > 122* 131* 106*  < > = values in this interval not displayed.  CBG (last 3)   Recent Labs  01/14/13 2347 01/15/13 0351 01/15/13 0743  GLUCAP 114* 103* 105*    Scheduled Meds: . digoxin  0.125 mg Oral Q48H  . insulin aspart  0-15  Units Subcutaneous Q4H  . metoCLOPramide (REGLAN) injection  5 mg Intravenous Q12H  . ondansetron (ZOFRAN) IV  4 mg Intravenous Q6H  . pantoprazole (PROTONIX) IV  40 mg Intravenous Q12H  . piperacillin-tazobactam (ZOSYN)  IV  2.25 g Intravenous Q8H  . sodium chloride  3 mL Intravenous Q12H    Continuous Infusions: . sodium chloride 135 mL/hr at 01/15/13 8315    Haylea Schlichting A. Jimmye Norman, RD, LDN Pager: [phone]

## 2013-01-15 NOTE — Progress Notes (Signed)
The patient states he has not had any further emesis since yesterday morning. He denies any significant abdominal pain. Nausea is slightly decreased. Apparently he did not tolerate multiple oral medications yesterday, though I would still like to try him on erythromycin to stimulate gastric emptying. Will continue to follow with you peripherally.

## 2013-01-15 NOTE — Progress Notes (Signed)
Patient c/o nausea at this time,. Zofran given as schedules,. Eating poorly this shift,.

## 2013-01-15 NOTE — Progress Notes (Signed)
[facility] PROGRESS NOTE  [name] NKN:[id number] DOB: [birth year] DOA: 01/07/2013 PCP: Leonides Grills, MD  Assessment/Plan:  Small bowel obstruction with right inguinal hernia status post hernia reduction in emergency room. NG discontinued per surgery 01/11/13. Diet was downgraded to clear liquids on 01/14/2013 after he had a large volume bilious emesis. Erythromycin and some of his other oral medications were withheld temporarily. Reglan was started at 5 mg every 12 hours. Zofran frequency was decreased to every 6 hours and Phenergan was discontinued to avoid CNS side effects. His digoxin level was ordered to rule out toxicity and it was within normal limits. He has had no bowel movements so far today. One moderate formed BM 01/11/13 after enema.  Dr. Arnoldo Morale favors conservative management as the patient would be very high risk for surgery. Zosyn day #8. We'll discontinue Zosyn tomorrow. He remains afebrile with normal white count. Will restart oral erythromycin if he can tolerate it. We'll advance his diet to a full liquid diet again. Will eventually need to transition Zofran to ODT in preparation to try to get the patient discharged some time this week. We'll order physical therapy to try to get the patient mobilized.   Repeat CT on 01/10/13 yields persistent diffuse fluid filled and dilated small bowel up to about 4 cm in maximum diameter. There is contrast material in the low colonic lumen today suggesting that some of the oral contrast consumed 3 days ago has migrated through into the colon. As such, imaging features would remain compatible with at least a high-grade partial obstruction. A definite transition zone is not identified, but is probably in the mid to distal small bowel.  Acute on chronic renal failure. Creatinine is stable, more or less. Appreciate nephrology consult. Adjustments in IV fluids and Lasix per nephrology. [place] without hydronephrosis. Will monitor.   Atrial  fibrillation with RVR, likely exacerbated by dehydration. Rapid rate now resolved. Heart rate range 86-89. Blood pressure remains soft. Appreciate cardiology assistance with rate management. Not candidate for anticoagulation due to underlying liver disease and tendency to bleed. Status post digoxin loading. Echocardiogram reveals preserved left ventricular systolic function with an ejection fraction of 60%, but with severe biatrial dilatation. Followup EKG yielded afib rvr LBBB. Monitor closely. Continue current management.   Diabetes. CBG range 123-160. Contine sliding scale insulin for now. Resume basal insulin once oral intake is consistent.   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 Continues to trend down.   Leukocytosis. resolved. No fever. Likely related to underlying abdominal process. Continue to follow. May also be related to the hemoconcentration   Anemia, chronic, iron deficiency and GI blood loss. Hg slowly trending downward, but stable over the past 24 hours.. No signs of bleeding. Baseline hemoglobin appears to be now 9, therefore he is likely dehydrated. Follow closely  Hypotension. SBP range 93-110 Continue IV fluids per renal. Does not appear to be septic or toxic.   Hyperkalemia: Resolved. Continue to monitor  Metabolic acidosis: Resolved previously, but now slightly low. We'll defer treatment to nephrology.    Code Status: full Family Communication: none present Disposition Plan: as condition warrants   Consultants:  Renal  Cardiology  General surgery  Procedures: 2-D echo:Study Conclusions  - Study data: Technically adequate study. - Left ventricle: The cavity size was normal. There was focal basal hypertrophy without dynamic obstruction. Systolic function was normal. The estimated ejection fraction was in the range of 60% to 65%. Wall motion was normal; there were no regional wall motion abnormalities. -  Aortic valve: Moderately calcified annulus.  Moderately thickened leaflets. - Mitral valve: Moderately calcified annulus. Mildly thickened leaflets . - Left atrium: The atrium was severely dilated. - Right ventricle: The cavity size was mildly dilated. - Right atrium: The atrium was severely dilated. - Tricuspid valve: Moderate regurgitation. - Pulmonary arteries: PA systolic pressure is borderline elevated. PA peak pressure: 56mm Hg (S). - Pericardium, extracardiac: There is fluid collection around the liver consistent with ascites. Transthoracic echocardiography. M-mode, complete 2D, spectral Doppler, and color Doppler. Height: Height: 185.4cm. Height: 73in. Weight: Weight: 88.9kg. Weight: 195.6lb. Body mass index: BMI: 25.9kg/m^2. Body surface area: BSA: 2.65m^2. Blood pressure: 110/55. Patient status: Inpatient. Location: Bedside.  Antibiotics:  Zosyn 01/08/13>>  HPI/Subjective: Sitting up in bed awake alert. He continues to have abdominal pain and nausea, but no vomiting. He denies bowel movement overnight. His close friend says that it appears that he is afraid to heat because he may vomit.  Objective: Filed Vitals:   01/15/13 1456  BP: 101/52  Pulse: 89  Temp: 98.1 F (36.7 C)  Resp: 18    Intake/Output Summary (Last 24 hours) at 01/15/13 1845 Last data filed at 01/15/13 1800  Gross per 24 hour  Intake   2270 ml  Output    800 ml  Net   1470 ml   Filed Weights   01/13/13 0415 01/14/13 0500 01/15/13 0500  Weight: 93.4 kg (205 lb 14.6 oz) 93.7 kg (206 lb 9.1 oz) 93.7 kg (206 lb 9.1 oz)    Exam:   General:  Skin slightly yellow, chronically ill appearing, no acute distress  Cardiovascular: irregularly irregular.  No LE edema  Respiratory: normal effort. Breath sounds clear to ascultation bilaterally.  Abdomen: non-distended. Bowel sounds are sluggish. Mild tenderness to palpation right lower quadrant. Palpable periumbilical hernia, nontender.  Musculoskeletal: joints without erythema or swelling.     Data Reviewed: Basic Metabolic Panel:  Recent Labs Lab 01/10/13 0544 01/11/13 0549 01/12/13 0548 01/13/13 0645 01/14/13 0543 01/15/13 0530  NA 146 145 148* 142 139 139  K 4.5 4.1 3.5* 4.1 4.0 3.9  CL 111 109 113* 108 105 104  CO2 18* 19 18* 21 20 18*  GLUCOSE 152* 129* 135* 122* 131* 106*  BUN 75* 79* 77* 72* 68* 72*  CREATININE 5.26* 5.34* 5.05* 4.80* 4.65* 4.77*  CALCIUM 7.5* 7.7* 7.6* 7.6* 7.6* 7.5*  PHOS 4.8*  --   --   --   --   --    Liver Function Tests:  Recent Labs Lab 01/09/13 0540  AST 13  ALT 6  ALKPHOS 86  BILITOT 1.5*  PROT 6.5  ALBUMIN 2.3*   No results found for this basename: LIPASE, AMYLASE,  in the last 168 hours  Recent Labs Lab 01/09/13 1227 01/13/13 0646  AMMONIA 33 20   CBC:  Recent Labs Lab 01/10/13 0544 01/11/13 0549 01/12/13 0548 01/13/13 0645 01/15/13 0530  WBC 5.5 5.7 6.9 6.4 10.3  HGB 9.2* 8.9* 8.8* 8.6* 8.7*  HCT 27.4* 26.3* 25.8* 25.9* 24.2*  MCV 92.9 92.6 91.8 91.2 90.6  PLT 58* 73* 71* 68* 72*   Cardiac Enzymes: No results found for this basename: CKTOTAL, CKMB, CKMBINDEX, TROPONINI,  in the last 168 hours BNP (last 3 results) No results found for this basename: PROBNP,  in the last 8760 hours CBG:  Recent Labs Lab 01/14/13 2347 01/15/13 0351 01/15/13 0743 01/15/13 1128 01/15/13 1708  GLUCAP 114* 103* 105* 132* 119*    No results found for this  or any previous visit (from the past 240 hour(s)).   Studies: No results found.  Scheduled Meds: . digoxin  0.125 mg Oral Q48H  . feeding supplement (RESOURCE BREEZE)  1 Container Oral TID BM  . insulin aspart  0-15 Units Subcutaneous Q4H  . metoCLOPramide (REGLAN) injection  5 mg Intravenous Q12H  . ondansetron (ZOFRAN) IV  4 mg Intravenous Q6H  . pantoprazole (PROTONIX) IV  40 mg Intravenous Q12H  . piperacillin-tazobactam (ZOSYN)  IV  2.25 g Intravenous Q8H  . sodium chloride  3 mL Intravenous Q12H   Continuous Infusions: . sodium chloride 135 mL/hr  at 01/15/13 1749    Principal Problem:   SBO (small bowel obstruction) Active Problems:   Cirrhosis of liver without mention of alcohol   Chronic kidney disease   NASH (nonalcoholic steatohepatitis)   Anemia   Thrombocytopenia   Diabetes   Dehydration   Acute renal failure   Inguinal hernia   Small bowel obstruction   Malnutrition of moderate degree   Metabolic acidosis   Hypokalemia   Atrial fibrillation with rapid ventricular response    Time spent: 30 minutes    [facility] Pager [phone]. If 7PM-7AM, please contact night-coverage at [email or web address], password [facility] 01/15/2013, 6:45 PM  LOS: 8 days

## 2013-01-15 NOTE — Progress Notes (Signed)
Subjective: Interval History: Patient abdominal pain is better no vomiting. He is tolerating clear liquid  Objective: Vital signs in last 24 hours: Temp:  [97.9 F (36.6 C)-98.7 F (37.1 C)] 97.9 F (36.6 C) (01/12 0355) Pulse Rate:  [87-98] 98 (01/12 0355) Resp:  [17-20] 18 (01/12 0355) BP: (90-105)/(45-59) 95/45 mmHg (01/12 0355) SpO2:  [96 %-99 %] 99 % (01/12 0355) Weight:  [93.7 kg (206 lb 9.1 oz)] 93.7 kg (206 lb 9.1 oz) (01/12 0500) Weight change: 0 kg (0 lb)  Intake/Output from previous day: 01/11 0701 - 01/12 0700 In: 200 [P.O.:200] Out: 2475 [Urine:2475] Intake/Output this shift:    General appearance: alert, cooperative and no distress Resp: clear to auscultation bilaterally Cardio: regular rate and rhythm, S1, S2 normal, no murmur, click, rub or gallop GI: He has some tenderness on palpation. Abdomen is hypoactive. Extremities: extremities normal, atraumatic, no cyanosis or edema  Lab Results:  Recent Labs  01/13/13 0645 01/15/13 0530  WBC 6.4 10.3  HGB 8.6* 8.7*  HCT 25.9* 24.2*  PLT 68* 72*   BMET:   Recent Labs  01/14/13 0543 01/15/13 0530  NA 139 139  K 4.0 3.9  CL 105 104  CO2 20 18*  GLUCOSE 131* 106*  BUN 68* 72*  CREATININE 4.65* 4.77*  CALCIUM 7.6* 7.5*   No results found for this basename: PTH,  in the last 72 hours Iron Studies:  No results found for this basename: IRON, TIBC, TRANSFERRIN, FERRITIN,  in the last 72 hours  Studies/Results: No results found.  I have reviewed the patient's current medications.  Assessment/Plan: Problem #1 acute kidney injury superimposed on chronic. His BUN is 72 and creatinine is 4.77 slight increase . Presently patient is none oliguric . Problem #2 diabetes Problem #3 history of liver cirrhosis Problem #4 small bowel obstruction. S/P reduction today seems to feel better Problem #5 hypotension: His blood pressure low normal. Problem #6 anemia: As this moment his iron saturation is low however  his ferritin is still with in normal range. Hence it seems to be a combination of  anemia  of iron deficiency and  chronic disease.Marland Kitchen His H/H is stable Problem #7 A trial fibrillation  Problem #8 metabolic acidosis.CO2 declining Problem#9 hypernatremia has corrected Plan: D/C lasix We'll continue  With hydration. We'll check his basic metabolic panel in the morning.    LOS: 8 days   Eymi Lipuma S 01/15/2013,7:56 AM

## 2013-01-16 LAB — GLUCOSE, CAPILLARY
GLUCOSE-CAPILLARY: 102 mg/dL — AB (ref 70–99)
GLUCOSE-CAPILLARY: 122 mg/dL — AB (ref 70–99)
Glucose-Capillary: 99 mg/dL (ref 70–99)
Glucose-Capillary: 100 mg/dL — ABNORMAL HIGH (ref 70–99)
Glucose-Capillary: 126 mg/dL — ABNORMAL HIGH (ref 70–99)
Glucose-Capillary: 170 mg/dL — ABNORMAL HIGH (ref 70–99)

## 2013-01-16 LAB — BASIC METABOLIC PANEL
BUN: 71 mg/dL — ABNORMAL HIGH (ref 6–23)
CHLORIDE: 102 meq/L (ref 96–112)
CO2: 18 meq/L — AB (ref 19–32)
Calcium: 7.4 mg/dL — ABNORMAL LOW (ref 8.4–10.5)
Creatinine, Ser: 4.64 mg/dL — ABNORMAL HIGH (ref 0.50–1.35)
GFR calc Af Amer: 14 mL/min — ABNORMAL LOW (ref >90)
GFR, EST NON AFRICAN AMERICAN: 12 mL/min — AB (ref >90)
GLUCOSE: 98 mg/dL (ref 70–99)
Potassium: 3.8 mEq/L (ref 3.7–5.3)
SODIUM: 138 meq/L (ref 137–147)

## 2013-01-16 MED ORDER — ONDANSETRON 4 MG PO TBDP
4.0000 mg | ORAL_TABLET | Freq: Three times a day (TID) | ORAL | Status: DC
  Administered 2013-01-16 – 2013-01-21 (×11): 4 mg via ORAL
  Filled 2013-01-16 (×12): qty 1

## 2013-01-16 NOTE — Progress Notes (Signed)
[name]  MRN: [id number]  DOB/AGE: [birth year] 68 y.o.  Primary Care Physician:Willie M, MD  Admit date: 01/07/2013  Chief Complaint:  Chief Complaint  Patient presents with  . Abdominal Pain    S-Pt presented on  01/07/2013 with  Chief Complaint  Patient presents with  . Abdominal Pain  .    Pt offers no new complaints  Meds . digoxin  0.125 mg Oral Q48H  . erythromycin  250 mg Oral Q breakfast  . feeding supplement (RESOURCE BREEZE)  1 Container Oral TID BM  . insulin aspart  0-15 Units Subcutaneous Q4H  . metoCLOPramide (REGLAN) injection  5 mg Intravenous Q12H  . ondansetron  4 mg Oral TID WC  . pantoprazole (PROTONIX) IV  40 mg Intravenous Q12H  . sodium chloride  3 mL Intravenous Q12H      Physical Exam: Vital signs in last 24 hours: Temp:  [97.5 F (36.4 C)-98 F (36.7 C)] 98 F (36.7 C) (01/13 0556) Pulse Rate:  [88] 88 (01/13 0556) Resp:  [20] 20 (01/13 0556) BP: (99-116)/(48-53) 99/48 mmHg (01/13 0556) SpO2:  [99 %-100 %] 99 % (01/13 0556) Weight:  [205 lb 14.6 oz (93.4 kg)] 205 lb 14.6 oz (93.4 kg) (01/13 0556) Weight change: -10.6 oz (-0.3 kg) Last BM Date: 01/16/13  Intake/Output from previous day: 01/12 0701 - 01/13 0700 In: 4023.3 [P.O.:720; I.V.:3203.3; IV Piggyback:100] Out: 800 [Urine:800] Total I/O In: 170 [P.O.:120; IV Piggyback:50] Out: 500 [Urine:500]   Physical Exam: General- pt is awake,alert, follows commands. Resp- No acute REsp distress, CTA B/L NO Rhonchi CVS- S1S2 regular in rate and rhythm GIT- BS+, soft, tenderness+minimal EXT- NO LE Edema,NO Cyanosis   Lab Results: CBC  Recent Labs  01/15/13 0530  WBC 10.3  HGB 8.7*  HCT 24.2*  PLT 72*    BMET  Recent Labs  01/15/13 0530 01/16/13 0558  NA 139 138  K 3.9 3.8  CL 104 102  CO2 18* 18*  GLUCOSE 106* 98  BUN 72* 71*  CREATININE 4.77* 4.64*  CALCIUM 7.5* 7.4*   Trend Creat 2015   3.0==>5.26==>4.77==>4.64 2014   1.6--2.3 2013    1.3--1.5 2011   1.2--1.6 2010   1.1--1.46 2007    1.6    Lab Results  Component Value Date   PTH 127.5* 01/09/2013   CALCIUM 7.4* 01/16/2013   PHOS 4.8* 01/10/2013        Impression: 1)Renal  AKI secondary to Prerenal/ATN/Post renal                AKI sec to Hypotension/ACE.               AKI on CKD               AKI improving slowly               CKD stage 3 .               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                  Nephrolithiasis Hx Present   2)HTN  BP at goal Not on nay meds at this time  3)Anemia HGb stable  4)CKD Mineral-Bone Disorder PTH High Secondary Hyperparathyroidism Present Phosphorus high   5)GI- admitted wit abdo pain sec to Partial SB obstruction Primary MD and Surgery following  6)FEN  Normokalemic NOrmonatremic   7)Acid base Co2 Not at goal Stable    Plan:  Will continue current care.   [name] S 01/16/2013, 3:08 PM

## 2013-01-16 NOTE — Clinical Social Work Placement (Signed)
Clinical Social Work Department CLINICAL SOCIAL WORK PLACEMENT NOTE 01/16/2013  Patient:  [name], [name]  Account Number:  [id number] Admit date:  01/07/2013  Clinical Social Worker:  Benay Pike, LCSW  Date/time:  01/16/2013 03:00 PM  Clinical Social Work is seeking post-discharge placement for this patient at the following level of care:   [place]   (*CSW will update this form in Epic as items are completed)   01/16/2013  Patient/family provided with [place] Department of Clinical Social Work's list of facilities offering this level of care within the geographic area requested by the patient (or if unable, by the patient's family).  01/16/2013  Patient/family informed of their freedom to choose among providers that offer the needed level of care, that participate in Medicare, Medicaid or managed care program needed by the patient, have an available bed and are willing to accept the patient.  01/16/2013  Patient/family informed of [facility]' ownership interest in [facility], as well as of the fact that they are under no obligation to receive care at this facility.  PASARR submitted to [employer] on  PASARR number received from [place] on   FL2 transmitted to all facilities in geographic area requested by pt/family on  01/16/2013 FL2 transmitted to all facilities within larger geographic area on   Patient informed that his/her managed care company has contracts with or will negotiate with  certain facilities, including the following:     Patient/family informed of bed offers received:   Patient chooses bed at  Physician recommends and patient chooses bed at    Patient to be transferred to  on   Patient to be transferred to facility by   The following physician request were entered in Epic:   Additional Comments: Pt has existing pasarr number.  Benay Pike, [place]

## 2013-01-16 NOTE — Progress Notes (Signed)
Pt's foley catheter removed at approximately 0830 this morning. Pt unable to void after multiple times being asked if needed to urinate. Pt bladder scanned and revealed approximately 240 ml's. Dr. Caryn Section paged and made aware.  New order for in and out cath now.

## 2013-01-16 NOTE — Clinical Social Work Psychosocial (Signed)
Clinical Social Work Department BRIEF PSYCHOSOCIAL ASSESSMENT 01/16/2013  Patient:  Panas,Jamicah L     Account Number:  [id number]     Admit date:  01/07/2013  Clinical Social Worker:  ,, LCSW  Date/Time:  01/16/2013 02:50 PM  Referred by:  Physician  Date Referred:  01/16/2013 Referred for  SNF Placement   Other Referral:   Interview type:  Patient Other interview type:    PSYCHOSOCIAL DATA Living Status:  [place] Admitted from facility:   Level of care:   Primary support name:  Diane Primary support relationship to patient:  SIBLING Degree of support available:   supportive    CURRENT CONCERNS Current Concerns  Post-Acute Placement   Other Concerns:    SOCIAL WORK ASSESSMENT / PLAN CSW met with pt at bedside. Pt alert and oriented and reports he lives with his sister and brother. Pt also has hired someone to assist around the house about 20 hours a week. Admitted with small bowel obstruction on 1/4. At baseline, he is fairly independent. He reports that his siblings provide transportation, but other than that he is on his own. Evaluated by PT today and recommendation is for SNF. Pt admits that he is feeling weak and is agreeable to consider rehab. He has an understanding of process as he reports he was at [facility] last year for about 6 weeks for therapy. Pt indicates this was a fairly positive experience for him. Aware of Medicare coverage/criteria. SNF list provided. Pt open to [facility] or [facility].   Assessment/plan status:  Psychosocial Support/Ongoing Assessment of Needs Other assessment/ plan:   Information/referral to community resources:   SNF list    PATIENT'S/FAMILY'S RESPONSE TO PLAN OF CARE: Pt plans to consider SNF for ST therapy prior to return home. CSW will initiate bed search and follow up with bed offers when available.        , LCSW [phone] 

## 2013-01-16 NOTE — Progress Notes (Signed)
Patient was seen and examined. He was discussed with nurse practitioner, Ms. Renard Hamper. Agree with her assessment and plan. This is the best I've seen the patient during this hospitalization. We'll continue current management. If he continues to improve, will change Reglan to by mouth twice a day and transition Protonix to by mouth tomorrow morning.  Physical therapy has evaluated the patient and recommended skilled nursing facility placement. The patient is in agreement with this. He may be ready for discharge tomorrow or Thursday.

## 2013-01-16 NOTE — Progress Notes (Signed)
[facility] PROGRESS NOTE  [name] YSA:[id number] DOB: 23-Nov-1945 DOA: 01/07/2013 PCP: Leonides Grills, MD  Assessment/Plan: Small bowel obstruction with right inguinal hernia status post hernia reduction in emergency room. NG discontinued per surgery 01/11/13. Diet was downgraded to clear liquids on 01/14/2013 after he had a large volume bilious emesis. Erythromycin and some of his other oral medications were withheld temporarily. Reglan was started at 5 mg every 12 hours. Zofran frequency was decreased to every 6 hours and Phenergan was discontinued to avoid CNS side effects. His digoxin level was ordered to rule out toxicity and it was within normal limits. Erythromycin resumed for breakfast time only for now.  He reports "several" BM's last night. Dr. Arnoldo Morale continues to favor conservative management as the patient would be very high risk for surgery. Will discontinue Zosyn. He remains afebrile with normal white count. Tolerating  full liquid diet this am. Will eventually need to transition Zofran to ODT in preparation to try to get the patient discharged some time this week. Up to chair this am. Await physical therapy recommendations.   Repeat CT on 01/10/13 yields persistent diffuse fluid filled and dilated small bowel up to about 4 cm in maximum diameter. There is contrast material in the low colonic lumen today suggesting that some of the oral contrast consumed 3 days prior has migrated through into the colon. As such, imaging features would remain compatible with at least a high-grade partial obstruction. A definite transition zone is not identified, but is probably in the mid to distal small bowel.   Acute on chronic renal failure. Creatinine remains stable, more or less. Appreciate nephrology consult. Adjustments in IV fluids and Lasix per nephrology. [place] without hydronephrosis. Will monitor.   Atrial fibrillation with RVR, likely exacerbated by dehydration. Rapid rate now resolved.  Heart rate range 86-89. Blood pressure remains soft. Appreciate cardiology assistance with rate management. Not candidate for anticoagulation due to underlying liver disease and tendency to bleed. Status post digoxin loading. Echocardiogram reveals preserved left ventricular systolic function with an ejection fraction of 60%, but with severe biatrial dilatation. Followup EKG yielded afib rvr LBBB.  Continue current management.   Diabetes. CBG range 99-126. Contine sliding scale insulin for now. Resume basal insulin once oral intake is consistent.   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 Continues to trend down.   Leukocytosis. resolved. No fever. Likely related to underlying abdominal process. Continue to follow. May also be related to the hemoconcentration   Anemia, chronic, iron deficiency and GI blood loss. No signs of bleeding. Baseline hemoglobin appears to be now 9, therefore he is likely dehydrated. Follow closely   Hypotension. SBP range 93-110 Continue IV fluids per renal. Does not appear to be septic or toxic.   Hyperkalemia: Resolved. Continue to monitor   Metabolic acidosis: Resolved previously, but now slightly low. We'll defer treatment to nephrology.    Code Status: full Family Communication: none present Disposition Plan: home when ready   Consultants: Renal  Cardiology  General surgery  Procedures: -D echo:Study Conclusions  - Study data: Technically adequate study. - Left ventricle: The cavity size was normal. There was focal basal hypertrophy without dynamic obstruction. Systolic function was normal. The estimated ejection fraction was in the range of 60% to 65%. Wall motion was normal; there were no regional wall motion abnormalities. - Aortic valve: Moderately calcified annulus. Moderately thickened leaflets. - Mitral valve: Moderately calcified annulus. Mildly thickened leaflets . - Left atrium: The atrium was severely dilated. - Right  ventricle: The cavity size was mildly dilated. - Right atrium: The atrium was severely dilated. - Tricuspid valve: Moderate regurgitation. - Pulmonary arteries: PA systolic pressure is borderline elevated. PA peak pressure: 51mm Hg (S). - Pericardium, extracardiac: There is fluid collection around the liver consistent with ascites. Transthoracic echocardiography. M-mode, complete 2D, spectral Doppler, and color Doppler. Height: Height: 185.4cm. Height: 73in. Weight: Weight: 88.9kg. Weight: 195.6lb. Body mass index: BMI: 25.9kg/m^2. Body surface area: BSA: 2.39m^2. Blood pressure: 110/55. Patient status: Inpatient. Location: Bedside.   Antibiotics:  01/08/13>>01/16/13  HPI/Subjective: Sitting in chair. Reports feeling "ok i guess". Reports several BM's last night and tolerating full liquid breakfast this am.   Objective: Filed Vitals:   01/16/13 0556  BP: 99/48  Pulse: 88  Temp: 98 F (36.7 C)  Resp: 20    Intake/Output Summary (Last 24 hours) at 01/16/13 1048 Last data filed at 01/16/13 1005  Gross per 24 hour  Intake 3903.25 ml  Output    800 ml  Net 3103.25 ml   Filed Weights   01/14/13 0500 01/15/13 0500 01/16/13 0556  Weight: 93.7 kg (206 lb 9.1 oz) 93.7 kg (206 lb 9.1 oz) 93.4 kg (205 lb 14.6 oz)    Exam:   General:  He is thin, frail chronically ill appearing   Cardiovascular: irregularly irregular, no LE edema  Respiratory: normal effort. BS are clear bilaterally to ausculation but slightly distant. i hear no wheeze or rhonchi  Abdomen: non-distend. i hear BS in all 4 quadrants but somewhat sluggish. Mild tenderness right lower quadrant. No guarding. Palpable periumbilical hernia that is non-tender  Musculoskeletal: no clubbing or cyanosis   Data Reviewed: Basic Metabolic Panel:  Recent Labs Lab 01/10/13 0544  01/12/13 0548 01/13/13 0645 01/14/13 0543 01/15/13 0530 01/16/13 0558  NA 146  < > 148* 142 139 139 138  K 4.5  < > 3.5* 4.1 4.0 3.9 3.8   CL 111  < > 113* 108 105 104 102  CO2 18*  < > 18* 21 20 18* 18*  GLUCOSE 152*  < > 135* 122* 131* 106* 98  BUN 75*  < > 77* 72* 68* 72* 71*  CREATININE 5.26*  < > 5.05* 4.80* 4.65* 4.77* 4.64*  CALCIUM 7.5*  < > 7.6* 7.6* 7.6* 7.5* 7.4*  PHOS 4.8*  --   --   --   --   --   --   < > = values in this interval not displayed. Liver Function Tests: No results found for this basename: AST, ALT, ALKPHOS, BILITOT, PROT, ALBUMIN,  in the last 168 hours No results found for this basename: LIPASE, AMYLASE,  in the last 168 hours  Recent Labs Lab 01/09/13 1227 01/13/13 0646  AMMONIA 33 20   CBC:  Recent Labs Lab 01/10/13 0544 01/11/13 0549 01/12/13 0548 01/13/13 0645 01/15/13 0530  WBC 5.5 5.7 6.9 6.4 10.3  HGB 9.2* 8.9* 8.8* 8.6* 8.7*  HCT 27.4* 26.3* 25.8* 25.9* 24.2*  MCV 92.9 92.6 91.8 91.2 90.6  PLT 58* 73* 71* 68* 72*   Cardiac Enzymes: No results found for this basename: CKTOTAL, CKMB, CKMBINDEX, TROPONINI,  in the last 168 hours BNP (last 3 results) No results found for this basename: PROBNP,  in the last 8760 hours CBG:  Recent Labs Lab 01/15/13 1708 01/15/13 2009 01/15/13 2310 01/16/13 0329 01/16/13 0733  GLUCAP 119* 126* 99 102* 100*    No results found for this or any previous visit (from the past 240 hour(s)).  Studies: No results found.  Scheduled Meds: . digoxin  0.125 mg Oral Q48H  . erythromycin  250 mg Oral Q breakfast  . feeding supplement (RESOURCE BREEZE)  1 Container Oral TID BM  . insulin aspart  0-15 Units Subcutaneous Q4H  . metoCLOPramide (REGLAN) injection  5 mg Intravenous Q12H  . ondansetron (ZOFRAN) IV  4 mg Intravenous Q6H  . pantoprazole (PROTONIX) IV  40 mg Intravenous Q12H  . piperacillin-tazobactam (ZOSYN)  IV  2.25 g Intravenous Q8H  . sodium chloride  3 mL Intravenous Q12H   Continuous Infusions: . sodium chloride 135 mL/hr at 01/16/13 1015    Principal Problem:   SBO (small bowel obstruction) Active Problems:    Cirrhosis of liver without mention of alcohol   Chronic kidney disease   NASH (nonalcoholic steatohepatitis)   Anemia   Thrombocytopenia   Diabetes   Dehydration   Acute renal failure   Inguinal hernia   Small bowel obstruction   Malnutrition of moderate degree   Metabolic acidosis   Hypokalemia   Atrial fibrillation with rapid ventricular response    Time spent: 35 minutes    [facility] Pager [phone]. If 7PM-7AM, please contact night-coverage at [email or web address], password [facility] 01/16/2013, 10:48 AM  LOS: 9 days

## 2013-01-16 NOTE — Evaluation (Signed)
Physical Therapy Evaluation Patient Details Name: [name] MRN: [id number] DOB: [birth year] Today's Date: 01/16/2013 Time: 2683-4196 PT Time Calculation (min): 41 min  PT Assessment / Plan / Recommendation History of Present Illness  Pt is admitted with a small bowel obstruction.  He has not  been appropriate for surgical intervention so has been treated medically.  He was admitted on 01-07-13 and has just today been able to eat very small amounts of food.  He has only been up to a chair recently.  Clinical Impression   Pt was seen for eval/tx.  He is profoundly deconditioned and is no longer able to ambulate.  He needed min to mod assist for transfers.  He is very cooperative and motivated to return to prior functional level. (He ambulated with walker or cane PTA).  He will need SNF at d/c and he is agreeable.    PT Assessment  Patient needs continued PT services    Follow Up Recommendations  SNF    Does the patient have the potential to tolerate intense rehabilitation      Barriers to Discharge        Equipment Recommendations  None recommended by PT    Recommendations for Other Services     Frequency Min 3X/week    Precautions / Restrictions Precautions Precautions: Fall Restrictions Weight Bearing Restrictions: No   Pertinent Vitals/Pain       Mobility  Bed Mobility Overal bed mobility: Needs Assistance Bed Mobility: Sit to Supine Sit to supine: Min assist Transfers Overall transfer level: Needs assistance Equipment used: None Transfers: Stand Pivot Transfers Stand pivot transfers: Mod assist General transfer comment: pt is unable to fully extend knees in stance during transfer...he is unable to try to ambualate due to weakness    Exercises General Exercises - Lower Extremity Ankle Circles/Pumps: AROM;Both;10 reps;Supine Short Arc Quad: AROM;Both;10 reps;Supine Heel Slides: AAROM;Both;10 reps;Supine Hip ABduction/ADduction: AAROM;Both;10 reps;Supine    PT Diagnosis: Difficulty walking;Generalized weakness  PT Problem List: Decreased strength;Decreased activity tolerance;Decreased mobility PT Treatment Interventions: Gait training;Functional mobility training;Therapeutic exercise     PT Goals(Current goals can be found in the care plan section) Acute Rehab PT Goals Patient Stated Goal: none stated PT Goal Formulation: With patient Time For Goal Achievement: 01/30/13 Potential to Achieve Goals: Good  Visit Information  Last PT Received On: 01/16/13 History of Present Illness: Pt is admitted with a small bowel obstruction.  He has not  been appropriate for surgical intervention so has been treated medically.  He was admitted on 01-07-13 and has just today been able to eat very small amounts of food.  He has only been up to a chair recently.       Prior [place] expects to be discharged to:: Skilled nursing facility Prior Function Level of Independence: Independent with assistive device(s) Communication Communication: No difficulties    Cognition  Cognition Arousal/Alertness: Awake/alert Behavior During Therapy: WFL for tasks assessed/performed Overall Cognitive Status: Within Functional Limits for tasks assessed    Extremity/Trunk Assessment Lower Extremity Assessment Lower Extremity Assessment: Generalized weakness   Balance Balance Overall balance assessment: No apparent balance deficits (not formally assessed)  End of Session PT - End of Session Equipment Utilized During Treatment: Gait belt Activity Tolerance: Patient limited by fatigue Patient left: in bed;with bed alarm set;with call bell/phone within reach  GP     Demetrios Isaacs L 01/16/2013, 2:18 PM

## 2013-01-17 LAB — GLUCOSE, CAPILLARY
GLUCOSE-CAPILLARY: 103 mg/dL — AB (ref 70–99)
GLUCOSE-CAPILLARY: 114 mg/dL — AB (ref 70–99)
GLUCOSE-CAPILLARY: 134 mg/dL — AB (ref 70–99)
GLUCOSE-CAPILLARY: 195 mg/dL — AB (ref 70–99)
Glucose-Capillary: 165 mg/dL — ABNORMAL HIGH (ref 70–99)
Glucose-Capillary: 150 mg/dL — ABNORMAL HIGH (ref 70–99)

## 2013-01-17 LAB — CBC
HCT: 22.4 % — ABNORMAL LOW (ref 39.0–52.0)
Hemoglobin: 7.9 g/dL — ABNORMAL LOW (ref 13.0–17.0)
MCH: 31.5 pg (ref 26.0–34.0)
MCHC: 35.3 g/dL (ref 30.0–36.0)
MCV: 89.2 fL (ref 78.0–100.0)
Platelets: 71 10*3/uL — ABNORMAL LOW (ref 150–400)
RBC: 2.51 MIL/uL — ABNORMAL LOW (ref 4.22–5.81)
RDW: 14.7 % (ref 11.5–15.5)
WBC: 7 10*3/uL (ref 4.0–10.5)

## 2013-01-17 LAB — BASIC METABOLIC PANEL
BUN: 64 mg/dL — ABNORMAL HIGH (ref 6–23)
CALCIUM: 7 mg/dL — AB (ref 8.4–10.5)
CO2: 17 mEq/L — ABNORMAL LOW (ref 19–32)
Chloride: 104 mEq/L (ref 96–112)
Creatinine, Ser: 4.27 mg/dL — ABNORMAL HIGH (ref 0.50–1.35)
GFR calc Af Amer: 15 mL/min — ABNORMAL LOW (ref >90)
GFR, EST NON AFRICAN AMERICAN: 13 mL/min — AB (ref >90)
GLUCOSE: 104 mg/dL — AB (ref 70–99)
Potassium: 3.6 mEq/L — ABNORMAL LOW (ref 3.7–5.3)
SODIUM: 136 meq/L — AB (ref 137–147)

## 2013-01-17 MED ORDER — POTASSIUM CHLORIDE CRYS ER 20 MEQ PO TBCR
40.0000 meq | EXTENDED_RELEASE_TABLET | Freq: Once | ORAL | Status: AC
  Administered 2013-01-17: 40 meq via ORAL
  Filled 2013-01-17: qty 2

## 2013-01-17 MED ORDER — METOCLOPRAMIDE HCL 10 MG PO TABS: 5.0000 mg | ORAL_TABLET | Freq: Three times a day (TID) | ORAL | Status: DC

## 2013-01-17 MED ORDER — HYDROCODONE-ACETAMINOPHEN 5-325 MG PO TABS
1.0000 | ORAL_TABLET | ORAL | Status: DC | PRN
  Administered 2013-01-17 – 2013-01-18 (×3): 1 via ORAL
  Filled 2013-01-17 (×5): qty 1

## 2013-01-17 MED ORDER — METOCLOPRAMIDE HCL 10 MG PO TABS
5.0000 mg | ORAL_TABLET | Freq: Two times a day (BID) | ORAL | Status: DC
  Administered 2013-01-17 – 2013-01-19 (×4): 5 mg via ORAL
  Filled 2013-01-17 (×4): qty 1

## 2013-01-17 MED ORDER — INSULIN ASPART 100 UNIT/ML ~~LOC~~ SOLN
0.0000 [IU] | Freq: Three times a day (TID) | SUBCUTANEOUS | Status: DC
  Administered 2013-01-18: 2 [IU] via SUBCUTANEOUS
  Administered 2013-01-18: 5 [IU] via SUBCUTANEOUS
  Administered 2013-01-18 – 2013-01-21 (×6): 2 [IU] via SUBCUTANEOUS

## 2013-01-17 MED ORDER — SODIUM BICARBONATE 8.4 % IV SOLN
INTRAVENOUS | Status: DC
  Administered 2013-01-17 – 2013-01-21 (×7): via INTRAVENOUS
  Filled 2013-01-17 (×10): qty 1000

## 2013-01-17 NOTE — Progress Notes (Addendum)
[name]  MRN: [id number]  DOB/AGE: 1945-10-15 68 y.o.  Primary Care Physician:Willie M, MD  Admit date: 01/07/2013  Chief Complaint:  Chief Complaint  Patient presents with  . Abdominal Pain    S-Pt presented on  01/07/2013 with  Chief Complaint  Patient presents with  . Abdominal Pain  .    Pt offers no new complaints.    Pt says he is feeling better.  Meds . digoxin  0.125 mg Oral Q48H  . erythromycin  250 mg Oral Q breakfast  . feeding supplement (RESOURCE BREEZE)  1 Container Oral TID BM  . insulin aspart  0-15 Units Subcutaneous Q4H  . metoCLOPramide  5 mg Oral BID AC  . ondansetron  4 mg Oral TID WC  . pantoprazole (PROTONIX) IV  40 mg Intravenous Q12H  . sodium chloride  3 mL Intravenous Q12H      Physical Exam: Vital signs in last 24 hours: Temp:  [97.2 F (36.2 C)-98.2 F (36.8 C)] 97.7 F (36.5 C) (01/14 0358) Pulse Rate:  [69-85] 80 (01/14 0358) Resp:  [18-20] 20 (01/14 0358) BP: (107-110)/(47-59) 109/47 mmHg (01/14 0358) SpO2:  [97 %-100 %] 97 % (01/14 0358) Weight:  [211 lb 3.2 oz (95.8 kg)] 211 lb 3.2 oz (95.8 kg) (01/14 0358) Weight change: 5 lb 4.7 oz (2.4 kg) Last BM Date: 01/17/13  Intake/Output from previous day: 01/13 0701 - 01/14 0700 In: 3612.5 [P.O.:300; I.V.:3262.5; IV Piggyback:50] Out: 702 [Urine:701; Stool:1] Total I/O In: 560 [P.O.:560] Out: -    Physical Exam: General- pt is awake,alert, follows commands. Resp- No acute REsp distress, CTA B/L NO Rhonchi CVS- S1S2 Irregular in rate and rhythm GIT- BS+, soft, tenderness( much better than before) EXT- NO LE Edema,NO Cyanosis   Lab Results: CBC  Recent Labs  01/15/13 0530 01/17/13 0557  WBC 10.3 7.0  HGB 8.7* 7.9*  HCT 24.2* 22.4*  PLT 72* 71*    BMET  Recent Labs  01/16/13 0558 01/17/13 0557  NA 138 136*  K 3.8 3.6*  CL 102 104  CO2 18* 17*  GLUCOSE 98 104*  BUN 71* 64*  CREATININE 4.64* 4.27*  CALCIUM 7.4* 7.0*   Trend Creat 2015    3.0==>5.26==>4.64==>4.27 2014   1.6--2.3 2013   1.3--1.5 2011   1.2--1.6 2010   1.1--1.46 2007    1.6    Lab Results  Component Value Date   PTH 127.5* 01/09/2013   CALCIUM 7.0* 01/17/2013   PHOS 4.8* 01/10/2013        Impression: 1)Renal  AKI secondary to Prerenal/ATN/Post renal                AKI sec to Hypotension/ACE.               AKI on CKD               AKI improving slowly               CKD stage 3 .               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                  Nephrolithiasis Hx Present   2)HTN  BP at goal Not on nay meds at this time  3)Anemia HGb stable  4)CKD Mineral-Bone Disorder PTH High Secondary Hyperparathyroidism Present Phosphorus high   5)GI- admitted wit abdo pain sec to Partial SB obstruction Primary MD and Surgery following  6)FEN  Normokalemic NOrmonatremic   7)Acid base Co2 Not at goal     Plan:  Will suggest to add Bicarb in IV -50 meq/liter Reduce the rate of IVf to 140ml.hr Follow BMet If plans to d/c then will suggest to follow Bmet as outpt every 48-72 hrs and Nephrology visit in 1-2 weeks   [name] S 01/17/2013, 3:15 PM

## 2013-01-17 NOTE — Progress Notes (Addendum)
Physical Therapy Treatment Patient Details Name: [name] MRN: [id number] DOB: 25-Oct-1945 Today's Date: 01/17/2013 Time: 3545-6256 PT Time Calculation (min): 20 min  PT Assessment / Plan / Recommendation  History of Present Illness     PT Comments   Pt cooperative and eager to complete exercises and transfer back to bed.  Pt c/o stomach pain with sitting position.  Therex focus on quad strengthening and educated pt on importance of knee extension with gait.  Pt able to ambulate 3 feet with RW and min assistance from chair to bed, min cueing for hand placement for safety.  Pt left in bed with call bell within reach.  Follow Up Recommendations        Does the patient have the potential to tolerate intense rehabilitation     Barriers to Discharge        Equipment Recommendations       Recommendations for Other Services    Frequency     Progress towards PT Goals Progress towards PT goals: Progressing toward goals  Plan      Precautions / Restrictions Precautions Precautions: Fall Restrictions Weight Bearing Restrictions: No    Mobility  Transfers Overall transfer level: Needs assistance Ambulation/Gait Ambulation/Gait assistance: Mod assist Ambulation Distance (Feet): 3 Feet Assistive device: Rolling walker (2 wheeled) Gait Pattern/deviations: Decreased stride length;Trunk flexed Gait velocity: slow and labored General Gait Details: Cueing for full extenson with gait.      Exercises General Exercises - Lower Extremity Ankle Circles/Pumps: AROM;Both;10 reps;Supine Quad Sets: AROM;Both;10 reps;Supine Short Arc Quad: AROM;Both;10 reps;Supine Heel Slides: AAROM;Both;10 reps;Supine Straight Leg Raises: AROM;Both;10 reps   PT Diagnosis:    PT Problem List:   PT Treatment Interventions:     PT Goals (current goals can now be found in the care plan section)    Visit Information  Last PT Received On: 01/17/13    Subjective Data      Cognition   Cognition Arousal/Alertness: Awake/alert Behavior During Therapy: [facility] for tasks assessed/performed Overall Cognitive Status: Within Functional Limits for tasks assessed    Balance     End of Session PT - End of Session Equipment Utilized During Treatment: Gait belt Activity Tolerance: Patient limited by fatigue Patient left: in bed;with bed alarm set;with call bell/phone within reach   GP     Aldona Lento 01/17/2013, 11:23 AM

## 2013-01-17 NOTE — Clinical Social Work Note (Signed)
CSW presented bed offer at [facility]. Pt was hoping to stay local to be closer to family, but was only interested in [facility] and no beds available currently. He accepts [employer]. Facility notified. Awaiting stability for d/c.   Benay Pike, [place]

## 2013-01-17 NOTE — Progress Notes (Signed)
  Subjective: Patient tolerating full liquid diet. States his nausea is much improved. Denies any abdominal pain. Continue to have bowel movements.  Objective: Vital signs in last 24 hours: Temp:  [97.2 F (36.2 C)-98.2 F (36.8 C)] 97.7 F (36.5 C) (01/14 0358) Pulse Rate:  [69-85] 80 (01/14 0358) Resp:  [18-20] 20 (01/14 0358) BP: (107-110)/(47-59) 109/47 mmHg (01/14 0358) SpO2:  [97 %-100 %] 97 % (01/14 0358) Weight:  [95.8 kg (211 lb 3.2 oz)] 95.8 kg (211 lb 3.2 oz) (01/14 0358) Last BM Date: 01/17/13  Intake/Output from previous day: 01/13 0701 - 01/14 0700 In: 3612.5 [P.O.:300; I.V.:3262.5; IV Piggyback:50] Out: 702 [Urine:701; Stool:1] Intake/Output this shift: Total I/O In: 360 [P.O.:360] Out: -   General appearance: alert, cooperative and no distress GI: Soft, nontender, nondistended. Active bowel sounds appreciated.  Lab Results:   Recent Labs  01/15/13 0530 01/17/13 0557  WBC 10.3 7.0  HGB 8.7* 7.9*  HCT 24.2* 22.4*  PLT 72* 71*   BMET  Recent Labs  01/16/13 0558 01/17/13 0557  NA 138 136*  K 3.8 3.6*  CL 102 104  CO2 18* 17*  GLUCOSE 98 104*  BUN 71* 64*  CREATININE 4.64* 4.27*  CALCIUM 7.4* 7.0*   PT/INR No results found for this basename: LABPROT, INR,  in the last 72 hours  Studies/Results: No results found.  Anti-infectives: Anti-infectives   Start     Dose/Rate Route Frequency Ordered Stop   01/16/13 0800  erythromycin (ERY-TAB) EC tablet 250 mg     250 mg Oral Daily with breakfast 01/15/13 1859     01/14/13 1200  erythromycin (E-MYCIN) tablet 250 mg  Status:  Discontinued     250 mg Oral 3 times daily with meals & bedtime 01/14/13 1002 01/14/13 1539   01/09/13 1400  piperacillin-tazobactam (ZOSYN) IVPB 2.25 g  Status:  Discontinued     2.25 g 100 mL/hr over 30 Minutes Intravenous 3 times per day 01/09/13 0953 01/16/13 1107   01/08/13 2200  piperacillin-tazobactam (ZOSYN) IVPB 3.375 g  Status:  Discontinued     3.375 g 12.5  mL/hr over 240 Minutes Intravenous 3 times per day 01/08/13 1943 01/09/13 2951      Assessment/Plan: Impression: Small bowel obstruction versus ileus, resolving. Clinically, the patient appears better. Agree with advancing to bland diet. Will continue to follow peripherally with you.  LOS: 10 days    Glennie Rodda A 01/17/2013

## 2013-01-17 NOTE — Progress Notes (Signed)
[facility] PROGRESS NOTE  [name] ZOX:[id number] DOB: March 05, 1945 DOA: 01/07/2013 PCP: Leonides Grills, MD  Assessment/Plan: Small bowel obstruction with right inguinal hernia status post hernia reduction in emergency room. NG discontinued per surgery 01/11/13. Diet was downgraded to clear liquids on 01/14/2013 after he had a large volume bilious emesis. Erythromycin and some of his other oral medications were withheld temporarily. Reglan was started at 5 mg every 12 hours. Zofran frequency was decreased to every 6 hours and Phenergan was discontinued to avoid CNS side effects. His digoxin level was ordered to rule out toxicity and it was within normal limits. Erythromycin resumed for breakfast time only for now. He reports "several" BM's last night. Tolerating full liquid diet without problem. Zofran transitioned to ODT 01/16/13. Will advance diet to bland. Will transition reglan to po. Will transition pain medicine to po.  Dr. Arnoldo Morale continues to favor conservative management as the patient would be very high risk for surgery. He remains afebrile with normal white count.    Repeat CT on 01/10/13 yields persistent diffuse fluid filled and dilated small bowel up to about 4 cm in maximum diameter. There is contrast material in the low colonic lumen today suggesting that some of the oral contrast consumed 3 days prior has migrated through into the colon. As such, imaging features would remain compatible with at least a high-grade partial obstruction. A definite transition zone is not identified, but is probably in the mid to distal small bowel.   Acute on chronic renal failure. Creatinine remains stable, more or less at 4.6. Appreciate nephrology consult. Adjustments in IV fluids and Lasix per nephrology. [place] without hydronephrosis. Will monitor.   Atrial fibrillation with RVR, likely exacerbated by dehydration. Rapid rate now resolved. Heart rate range 86-89. Blood pressure remains soft. Appreciate  cardiology assistance with rate management. Not candidate for anticoagulation due to underlying liver disease and tendency to bleed. Status post digoxin loading. Echocardiogram reveals preserved left ventricular systolic function with an ejection fraction of 60%, but with severe biatrial dilatation. Followup EKG yielded afib rvr LBBB. Continue current management.   Diabetes. CBG range 103-134. Contine sliding scale insulin for now. Resume basal insulin once oral intake is consistent.   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 Continues to trend down.   Leukocytosis. resolved. No fever. Likely related to underlying abdominal process. Continue to follow. May also be related to the hemoconcentration   Anemia, chronic, iron deficiency and GI blood loss. No signs of bleeding. Current hg 7.9. Baseline hemoglobin appears to be now 9, therefore he is likely dehydrated. Follow closely. Check FOBT  Hypotension. SBP range 107-110 Continue IV fluids per renal. Does not appear to be septic or toxic.   Hyperkalemia:  Resolved. Continue to monitor   Hypokalemia: mild. Will replete and recheck.   Metabolic acidosis: Resolved previously, but now continues. We'll defer treatment to nephrology.    Code Status: full Family Communication: none present Disposition Plan: home when ready   Consultants:  Surgery  Renal  General surger  Procedures:  none  Antibiotics:  01/08/13 - 01/16/13  HPI/Subjective: Reports feeling "better". Continues with mild RLQ pain.   Objective: Filed Vitals:   01/17/13 0358  BP: 109/47  Pulse: 80  Temp: 97.7 F (36.5 C)  Resp: 20    Intake/Output Summary (Last 24 hours) at 01/17/13 1130 Last data filed at 01/17/13 0834  Gross per 24 hour  Intake 3682.5 ml  Output    202 ml  Net 3480.5 ml  Filed Weights   01/15/13 0500 01/16/13 0556 01/17/13 0358  Weight: 93.7 kg (206 lb 9.1 oz) 93.4 kg (205 lb 14.6 oz) 95.8 kg (211 lb 3.2 oz)     Exam:   General:  Frail, pale chronically ill appearing  Cardiovascular: irregularly irregular no LE edema  Respiratory: normal effort. BS clear to auscultation bilaterally. No wheeze or rhonchi  Abdomen: non-distended, soft +BS . Periumbilical hernia non-tender  Musculoskeletal: no clubbing or cyanosis   Data Reviewed: Basic Metabolic Panel:  Recent Labs Lab 01/13/13 0645 01/14/13 0543 01/15/13 0530 01/16/13 0558 01/17/13 0557  NA 142 139 139 138 136*  K 4.1 4.0 3.9 3.8 3.6*  CL 108 105 104 102 104  CO2 21 20 18* 18* 17*  GLUCOSE 122* 131* 106* 98 104*  BUN 72* 68* 72* 71* 64*  CREATININE 4.80* 4.65* 4.77* 4.64* 4.27*  CALCIUM 7.6* 7.6* 7.5* 7.4* 7.0*   Liver Function Tests: No results found for this basename: AST, ALT, ALKPHOS, BILITOT, PROT, ALBUMIN,  in the last 168 hours No results found for this basename: LIPASE, AMYLASE,  in the last 168 hours  Recent Labs Lab 01/13/13 0646  AMMONIA 20   CBC:  Recent Labs Lab 01/11/13 0549 01/12/13 0548 01/13/13 0645 01/15/13 0530 01/17/13 0557  WBC 5.7 6.9 6.4 10.3 7.0  HGB 8.9* 8.8* 8.6* 8.7* 7.9*  HCT 26.3* 25.8* 25.9* 24.2* 22.4*  MCV 92.6 91.8 91.2 90.6 89.2  PLT 73* 71* 68* 72* 71*   Cardiac Enzymes: No results found for this basename: CKTOTAL, CKMB, CKMBINDEX, TROPONINI,  in the last 168 hours BNP (last 3 results) No results found for this basename: PROBNP,  in the last 8760 hours CBG:  Recent Labs Lab 01/16/13 1654 01/16/13 1948 01/16/13 2349 01/17/13 0358 01/17/13 0733  GLUCAP 122* 170* 134* 114* 103*    No results found for this or any previous visit (from the past 240 hour(s)).   Studies: No results found.  Scheduled Meds: . digoxin  0.125 mg Oral Q48H  . erythromycin  250 mg Oral Q breakfast  . feeding supplement (RESOURCE BREEZE)  1 Container Oral TID BM  . insulin aspart  0-15 Units Subcutaneous Q4H  . metoCLOPramide  5 mg Oral Q12H  . ondansetron  4 mg Oral TID WC  .  pantoprazole (PROTONIX) IV  40 mg Intravenous Q12H  . sodium chloride  3 mL Intravenous Q12H   Continuous Infusions: . sodium chloride 135 mL/hr at 01/17/13 0400    Principal Problem:   SBO (small bowel obstruction) Active Problems:   Cirrhosis of liver without mention of alcohol   Chronic kidney disease   NASH (nonalcoholic steatohepatitis)   Anemia   Thrombocytopenia   Diabetes   Dehydration   Acute renal failure   Inguinal hernia   Small bowel obstruction   Malnutrition of moderate degree   Metabolic acidosis   Hypokalemia   Atrial fibrillation with rapid ventricular response    Time spent: 35 minutes    [facility] Pager [phone]. If 7PM-7AM, please contact night-coverage at [email or web address], password [facility] 01/17/2013, 11:30 AM  LOS: 10 days

## 2013-01-17 NOTE — Progress Notes (Signed)
Pt voided incontinently multiple times during the night. Bladder scan performed and 20-30cc noted.

## 2013-01-17 NOTE — Progress Notes (Signed)
Patient seen and examined.  Agree with note as above per Dyanne Carrel,  Patient appears to be clinically improving and overall looks better than when I last saw him last week.  He is moving his bowels and passing flatus.  He is tolerating full liquids and has been advanced to soft diet.  His renal function appears to be slowly improving with IV fluids. Discussed with nephrology and since bicarbonate is low, likely from renal failure, recommendations were to change IV fluids to bicarbonate infusion.  Will recheck labs in am.  Anticipate discharge to SNF in the next 24 hours if continues to improve.  MEMON,JEHANZEB

## 2013-01-17 NOTE — Clinical Social Work Placement (Signed)
Clinical Social Work Department CLINICAL SOCIAL WORK PLACEMENT NOTE 01/17/2013  Patient:  [name], [name]  Account Number:  [id number] Admit date:  01/07/2013  Clinical Social Worker:  Benay Pike, LCSW  Date/time:  01/16/2013 03:00 PM  Clinical Social Work is seeking post-discharge placement for this patient at the following level of care:   [place]   (*CSW will update this form in Epic as items are completed)   01/16/2013  Patient/family provided with [place] Department of Clinical Social Work's list of facilities offering this level of care within the geographic area requested by the patient (or if unable, by the patient's family).  01/16/2013  Patient/family informed of their freedom to choose among providers that offer the needed level of care, that participate in Medicare, Medicaid or managed care program needed by the patient, have an available bed and are willing to accept the patient.  01/16/2013  Patient/family informed of [facility]' ownership interest in [facility], as well as of the fact that they are under no obligation to receive care at this facility.  PASARR submitted to [employer] on  PASARR number received from [place] on   FL2 transmitted to all facilities in geographic area requested by pt/family on  01/16/2013 FL2 transmitted to all facilities within larger geographic area on   Patient informed that his/her managed care company has contracts with or will negotiate with  certain facilities, including the following:     Patient/family informed of bed offers received:  01/17/2013 Patient chooses bed at [facility] Physician recommends and patient chooses bed at    Patient to be transferred to  on   Patient to be transferred to facility by   The following physician request were entered in Epic:   Additional Comments: Pt has existing pasarr number.  Benay Pike, [place]

## 2013-01-18 LAB — CBC
HCT: 20.7 % — ABNORMAL LOW (ref 39.0–52.0)
HEMOGLOBIN: 7.5 g/dL — AB (ref 13.0–17.0)
MCH: 32.1 pg (ref 26.0–34.0)
MCHC: 36.2 g/dL — ABNORMAL HIGH (ref 30.0–36.0)
MCV: 88.5 fL (ref 78.0–100.0)
Platelets: 74 10*3/uL — ABNORMAL LOW (ref 150–400)
RBC: 2.34 MIL/uL — AB (ref 4.22–5.81)
RDW: 14.9 % (ref 11.5–15.5)
WBC: 6.4 10*3/uL (ref 4.0–10.5)

## 2013-01-18 LAB — OCCULT BLOOD X 1 CARD TO LAB, STOOL: Fecal Occult Bld: NEGATIVE

## 2013-01-18 LAB — BASIC METABOLIC PANEL
BUN: 62 mg/dL — ABNORMAL HIGH (ref 6–23)
CO2: 17 meq/L — AB (ref 19–32)
Calcium: 7.1 mg/dL — ABNORMAL LOW (ref 8.4–10.5)
Chloride: 104 mEq/L (ref 96–112)
Creatinine, Ser: 3.82 mg/dL — ABNORMAL HIGH (ref 0.50–1.35)
GFR calc Af Amer: 17 mL/min — ABNORMAL LOW (ref >90)
GFR, EST NON AFRICAN AMERICAN: 15 mL/min — AB (ref >90)
GLUCOSE: 138 mg/dL — AB (ref 70–99)
POTASSIUM: 3.7 meq/L (ref 3.7–5.3)
SODIUM: 135 meq/L — AB (ref 137–147)

## 2013-01-18 LAB — GLUCOSE, CAPILLARY
GLUCOSE-CAPILLARY: 206 mg/dL — AB (ref 70–99)
GLUCOSE-CAPILLARY: 148 mg/dL — AB (ref 70–99)
Glucose-Capillary: 124 mg/dL — ABNORMAL HIGH (ref 70–99)
Glucose-Capillary: 206 mg/dL — ABNORMAL HIGH (ref 70–99)

## 2013-01-18 LAB — PREPARE RBC (CROSSMATCH)

## 2013-01-18 MED ORDER — DIPHENHYDRAMINE HCL 25 MG PO CAPS
25.0000 mg | ORAL_CAPSULE | ORAL | Status: DC | PRN
  Administered 2013-01-18: 25 mg via ORAL
  Filled 2013-01-18: qty 1

## 2013-01-18 MED ORDER — CAMPHOR-MENTHOL 0.5-0.5 % EX LOTN
TOPICAL_LOTION | CUTANEOUS | Status: DC | PRN
  Administered 2013-01-18: 21:00:00 via TOPICAL
  Filled 2013-01-18: qty 222

## 2013-01-18 MED ORDER — CAMPHOR-MENTHOL 0.5-0.5 % EX LOTN
TOPICAL_LOTION | CUTANEOUS | Status: AC
  Filled 2013-01-18: qty 222

## 2013-01-18 MED ORDER — SODIUM BICARBONATE 650 MG PO TABS
650.0000 mg | ORAL_TABLET | Freq: Two times a day (BID) | ORAL | Status: DC
  Administered 2013-01-18 – 2013-01-21 (×7): 650 mg via ORAL
  Filled 2013-01-18 (×8): qty 1

## 2013-01-18 NOTE — Progress Notes (Signed)
Physical Therapy Treatment Patient Details Name: [name] MRN: [id number] DOB: 1945-09-29 Today's Date: 01/18/2013 Time: 6222-9798 PT Time Calculation (min): 16 min  PT Assessment / Plan / Recommendation  PT Comments   Pt refuses gait training or transfers but agrees to complete bed exercises. Therapist facilitated activities to improve LE strength and reduce chances of muscle atrophy. Pt completes therex well with multimodal cueing for technique. Pt tolerates tx well.   Plan Current plan remains appropriate    Pertinent Vitals/Pain 0/10    Exercises General Exercises - Lower Extremity Ankle Circles/Pumps: AROM;Both;10 reps;Supine Heel Slides: Both;10 reps;Supine;AROM Hip ABduction/ADduction: Both;10 reps;Supine;AROM Straight Leg Raises: AROM;Both;10 reps;Supine    Visit Information  Last PT Received On: 01/18/13    Subjective Data  Pt c/o back itching. (Nursing aware)  End of Session PT - End of Session Activity Tolerance: Patient limited by fatigue Patient left: in bed;with bed alarm set;with call bell/phone within reach   Rachelle Hora, PTA  01/18/2013, 9:41 AM

## 2013-01-18 NOTE — Progress Notes (Signed)
Subjective: Interval History: Patient abdominal pain is better no vomiting. He fills better. Denies any difficulty in breathing  Objective: Vital signs in last 24 hours: Temp:  [97.8 F (36.6 C)-98.1 F (36.7 C)] 97.8 F (36.6 C) (01/15 0519) Pulse Rate:  [80-89] 89 (01/15 0519) Resp:  [20] 20 (01/15 0519) BP: (113-171)/(64-75) 171/75 mmHg (01/15 0519) SpO2:  [98 %-99 %] 99 % (01/15 0519) Weight change:   Intake/Output from previous day: 01/14 0701 - 01/15 0700 In: 1128.3 [P.O.:1030; I.V.:98.3] Out: 150 [Urine:150] Intake/Output this shift:    Patient is somelemt but arouisable Chest is clear Heart RRR  Abdomen soft none tender and posetive bowl sound Extremities trace edema  Lab Results:  Recent Labs  01/17/13 0557 01/18/13 0538  WBC 7.0 6.4  HGB 7.9* 7.5*  HCT 22.4* 20.7*  PLT 71* 74*   BMET:   Recent Labs  01/17/13 0557 01/18/13 0538  NA 136* 135*  K 3.6* 3.7  CL 104 104  CO2 17* 17*  GLUCOSE 104* 138*  BUN 64* 62*  CREATININE 4.27* 3.82*  CALCIUM 7.0* 7.1*   No results found for this basename: PTH,  in the last 72 hours Iron Studies:  No results found for this basename: IRON, TIBC, TRANSFERRIN, FERRITIN,  in the last 72 hours  Studies/Results: No results found.  I have reviewed the patient's current medications.  Assessment/Plan: Problem #1 acute kidney injury superimposed on chronic. His BUN is 62 and creatinine is 3.88 . His renal function is progressively improving  . Presently patient is none oliguric . Problem #2 diabetes Problem #3 history of liver cirrhosis Problem #4 small bowel obstruction. S/P reduction today seems to feel better Problem #5 hypotension: His blood pressure low normal. Problem #6 anemia: His H/H is low Problem #7 A trial fibrillation  Problem #8 metabolic acidosis.CO2 low Problem#9 hypernatremia has corrected Plan: sodium bicarbonate 600 mg po bid We'll check his basic metabolic panel in the morning.    LOS: 11  days   Kiasia Chou S 01/18/2013,7:51 AM

## 2013-01-18 NOTE — Progress Notes (Signed)
[facility] PROGRESS NOTE  [name] LYY:[id number] DOB: 1945-08-10 DOA: 01/07/2013 PCP: Leonides Grills, MD  Assessment/Plan: Small bowel obstruction with right inguinal hernia status post hernia reduction in emergency room. NG discontinued per surgery 01/11/13. Diet was downgraded to clear liquids on 01/14/2013 after he had a large volume bilious emesis. Erythromycin and some of his other oral medications were withheld temporarily. Reglan was started at 5 mg every 12 hours. Zofran frequency was decreased to every 6 hours and Phenergan was discontinued to avoid CNS side effects. His digoxin level was ordered to rule out toxicity and it was within normal limits. Erythromycin resumed for breakfast time only for now. Daily BM's for last 3 days. Tolerating bland diet.  Zofran transitioned to ODT 01/16/13. Dr. Arnoldo Morale continues to favor conservative management as the patient would be very high risk for surgery. He remains afebrile with normal white count. Repeat CT on 01/10/13 yields persistent diffuse fluid filled and dilated small bowel up to about 4 cm in maximum diameter. There is contrast material in the low colonic lumen today suggesting that some of the oral contrast consumed 3 days prior has migrated through into the colon. As such, imaging features would remain compatible with at least a high-grade partial obstruction. A definite transition zone is not identified, but is probably in the mid to distal small bowel.   Acute on chronic renal failure. Creatinine remains continues to trend downward.  Appreciate nephrology consult. Adjustments in IV fluids. [place] without hydronephrosis. Will monitor.   Atrial fibrillation with RVR, likely exacerbated by dehydration. Rapid rate now resolved. Heart rate range 86-89. Blood pressure trending up somewhat. Appreciate cardiology assistance with rate management. Not candidate for anticoagulation due to underlying liver disease and tendency to bleed. Status post  digoxin loading. Echocardiogram reveals preserved left ventricular systolic function with an ejection fraction of 60%, but with severe biatrial dilatation. Followup EKG yielded afib rvr LBBB. Continue current management.   Diabetes. CBG range 124-195. Contine sliding scale insulin for now. Resume basal insulin once oral intake is consistent.   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 Continues to trend down.   Leukocytosis. resolved. No fever. Likely related to underlying abdominal process. Continue to follow. May also be related to the hemoconcentration   Anemia, chronic, iron deficiency and GI blood loss. Trending downward. Await FOBT. Will transfuse 1 unit PRBC's.  No signs of bleeding. Current hg 7.5. Baseline hemoglobin appears to be now 9 Follow closely.   Hypotension. SBP range 117-170 Continue IV fluids per renal. Does not appear to be septic or toxic.   Hyperkalemia: Resolved. Continue to monitor  Hypokalemia: mild. Resolved.    Metabolic acidosis: No improvement inspite of IV repletion. PO bicarb started per renal. Monitor   Code Status: full Family Communication: none present Disposition Plan: snf hopefully tomorrow   Consultants: Surgery  Renal  General surgery  Procedures:  none  Antibiotics:  Zosyn 01/08/13-01/16/13  HPI/Subjective: Sitting up in bed alert. Denies discomfort/nausea.   Objective: Filed Vitals:   01/18/13 0519  BP: 171/75  Pulse: 89  Temp: 97.8 F (36.6 C)  Resp: 20    Intake/Output Summary (Last 24 hours) at 01/18/13 1004 Last data filed at 01/18/13 0831  Gross per 24 hour  Intake 768.34 ml  Output    650 ml  Net 118.34 ml   Filed Weights   01/15/13 0500 01/16/13 0556 01/17/13 0358  Weight: 93.7 kg (206 lb 9.1 oz) 93.4 kg (205 lb 14.6 oz) 95.8 kg (211  lb 3.2 oz)    Exam:   General:  Thin frail  Cardiovascular: irregularly irregular  Respiratory: normal effort somewhat shallow. BS clear bilaterally no  wheeze  Abdomen: non-distended, +BS soft mild tenderness RLQ. Periumbilical hernia non-tender  Musculoskeletal: poor muscle tone   Data Reviewed: Basic Metabolic Panel:  Recent Labs Lab 01/14/13 0543 01/15/13 0530 01/16/13 0558 01/17/13 0557 01/18/13 0538  NA 139 139 138 136* 135*  K 4.0 3.9 3.8 3.6* 3.7  CL 105 104 102 104 104  CO2 20 18* 18* 17* 17*  GLUCOSE 131* 106* 98 104* 138*  BUN 68* 72* 71* 64* 62*  CREATININE 4.65* 4.77* 4.64* 4.27* 3.82*  CALCIUM 7.6* 7.5* 7.4* 7.0* 7.1*   Liver Function Tests: No results found for this basename: AST, ALT, ALKPHOS, BILITOT, PROT, ALBUMIN,  in the last 168 hours No results found for this basename: LIPASE, AMYLASE,  in the last 168 hours  Recent Labs Lab 01/13/13 0646  AMMONIA 20   CBC:  Recent Labs Lab 01/12/13 0548 01/13/13 0645 01/15/13 0530 01/17/13 0557 01/18/13 0538  WBC 6.9 6.4 10.3 7.0 6.4  HGB 8.8* 8.6* 8.7* 7.9* 7.5*  HCT 25.8* 25.9* 24.2* 22.4* 20.7*  MCV 91.8 91.2 90.6 89.2 88.5  PLT 71* 68* 72* 71* 74*   Cardiac Enzymes: No results found for this basename: CKTOTAL, CKMB, CKMBINDEX, TROPONINI,  in the last 168 hours BNP (last 3 results) No results found for this basename: PROBNP,  in the last 8760 hours CBG:  Recent Labs Lab 01/17/13 0733 01/17/13 1107 01/17/13 1618 01/17/13 1943 01/18/13 0709  GLUCAP 103* 165* 150* 195* 124*    No results found for this or any previous visit (from the past 240 hour(s)).   Studies: No results found.  Scheduled Meds: . digoxin  0.125 mg Oral Q48H  . erythromycin  250 mg Oral Q breakfast  . feeding supplement (RESOURCE BREEZE)  1 Container Oral TID BM  . insulin aspart  0-15 Units Subcutaneous TID WC  . metoCLOPramide  5 mg Oral BID AC  . ondansetron  4 mg Oral TID WC  . pantoprazole (PROTONIX) IV  40 mg Intravenous Q12H  . sodium bicarbonate  650 mg Oral BID  . sodium chloride  3 mL Intravenous Q12H   Continuous Infusions: . sodium chloride 0.45 %  1,000 mL with sodium bicarbonate 50 mEq infusion 100 mL/hr at 01/18/13 0510    Principal Problem:   SBO (small bowel obstruction) Active Problems:   Cirrhosis of liver without mention of alcohol   Chronic kidney disease   NASH (nonalcoholic steatohepatitis)   Anemia   Thrombocytopenia   Diabetes   Dehydration   Acute renal failure   Inguinal hernia   Small bowel obstruction   Malnutrition of moderate degree   Metabolic acidosis   Hypokalemia   Atrial fibrillation with rapid ventricular response    Time spent: 35 minutes    [facility] Pager [phone]. If 7PM-7AM, please contact night-coverage at [email or web address], password [facility] 01/18/2013, 10:04 AM  LOS: 11 days

## 2013-01-18 NOTE — Progress Notes (Signed)
Patient seen and examined. Above note reviewed.  Regarding his small bowel obstruction, clinically he appears to be improving. The patient is able to tolerate a solid diet without significant nausea or vomiting. Last bowel movement was yesterday.  Atrial fibrillation with rapid ventricular response. Appears to be improved with medications. He is not a candidate for anticoagulation due to history of bleeding.  Acute on chronic renal failure. Creatinine continues to slowly improve. The chest was made and a fluids. Continue to monitor.  Metabolic acidosis. Likely related to renal failure. Patient is on a bicarbonate infusion.  Anemia. His hemoglobin is 7.5 today. We'll transfuse 1 unit of PRBCs. He describes dark-colored stools. These will be sent for occult blood.  Mckay Brandt

## 2013-01-19 ENCOUNTER — Inpatient Hospital Stay (HOSPITAL_COMMUNITY): Payer: Medicare Other

## 2013-01-19 LAB — BASIC METABOLIC PANEL
BUN: 54 mg/dL — ABNORMAL HIGH (ref 6–23)
CO2: 19 mEq/L (ref 19–32)
CREATININE: 3.21 mg/dL — AB (ref 0.50–1.35)
Calcium: 7.7 mg/dL — ABNORMAL LOW (ref 8.4–10.5)
Chloride: 102 mEq/L (ref 96–112)
GFR, EST AFRICAN AMERICAN: 21 mL/min — AB (ref >90)
GFR, EST NON AFRICAN AMERICAN: 19 mL/min — AB (ref >90)
Glucose, Bld: 150 mg/dL — ABNORMAL HIGH (ref 70–99)
Potassium: 3.8 mEq/L (ref 3.7–5.3)
SODIUM: 136 meq/L — AB (ref 137–147)

## 2013-01-19 LAB — TYPE AND SCREEN
ABO/RH(D): O POS
Antibody Screen: NEGATIVE
Unit division: 0

## 2013-01-19 LAB — GLUCOSE, CAPILLARY
Glucose-Capillary: 143 mg/dL — ABNORMAL HIGH (ref 70–99)
Glucose-Capillary: 141 mg/dL — ABNORMAL HIGH (ref 70–99)
Glucose-Capillary: 135 mg/dL — ABNORMAL HIGH (ref 70–99)
Glucose-Capillary: 122 mg/dL — ABNORMAL HIGH (ref 70–99)

## 2013-01-19 LAB — CBC
HCT: 23 % — ABNORMAL LOW (ref 39.0–52.0)
Hemoglobin: 8.5 g/dL — ABNORMAL LOW (ref 13.0–17.0)
MCH: 32.2 pg (ref 26.0–34.0)
MCHC: 37 g/dL — ABNORMAL HIGH (ref 30.0–36.0)
MCV: 87.1 fL (ref 78.0–100.0)
PLATELETS: 93 10*3/uL — AB (ref 150–400)
RBC: 2.64 MIL/uL — AB (ref 4.22–5.81)
RDW: 15.3 % (ref 11.5–15.5)
WBC: 8 10*3/uL (ref 4.0–10.5)

## 2013-01-19 MED ORDER — ONDANSETRON HCL 4 MG/2ML IJ SOLN
4.0000 mg | Freq: Once | INTRAMUSCULAR | Status: AC
  Administered 2013-01-19: 4 mg via INTRAVENOUS
  Filled 2013-01-19: qty 2

## 2013-01-19 MED ORDER — ONDANSETRON HCL 4 MG/2ML IJ SOLN
4.0000 mg | Freq: Four times a day (QID) | INTRAMUSCULAR | Status: DC | PRN
  Administered 2013-01-19 – 2013-01-20 (×3): 4 mg via INTRAVENOUS
  Filled 2013-01-19 (×3): qty 2

## 2013-01-19 MED ORDER — METOCLOPRAMIDE HCL 5 MG/ML IJ SOLN
10.0000 mg | Freq: Once | INTRAMUSCULAR | Status: AC
  Administered 2013-01-19: 10 mg via INTRAVENOUS
  Filled 2013-01-19: qty 2

## 2013-01-19 MED ORDER — PANTOPRAZOLE SODIUM 40 MG PO TBEC
40.0000 mg | DELAYED_RELEASE_TABLET | Freq: Two times a day (BID) | ORAL | Status: DC
  Administered 2013-01-19 – 2013-01-21 (×4): 40 mg via ORAL
  Filled 2013-01-19 (×4): qty 1

## 2013-01-19 MED ORDER — METOCLOPRAMIDE HCL 5 MG/ML IJ SOLN
5.0000 mg | Freq: Two times a day (BID) | INTRAMUSCULAR | Status: DC
  Administered 2013-01-19 – 2013-01-21 (×5): 5 mg via INTRAVENOUS
  Filled 2013-01-19 (×5): qty 2

## 2013-01-19 MED ORDER — ONDANSETRON HCL 4 MG/5ML PO SOLN: 4.0000 mg | Freq: Once | ORAL | Status: DC

## 2013-01-19 NOTE — Progress Notes (Signed)
PT Cancellation Note  Patient Details Name: [name] MRN: [id number] DOB: November 29, 1945   Cancelled Treatment:    Reason Eval/Treat Not Completed: Fatigue/lethargy limiting ability to participate Pt is annoyed by my visit.  He states that he is trying to sleep and no one will let him.  He states that he did not sleep at all last night.  Demetrios Isaacs L 01/19/2013, 11:29 AM

## 2013-01-19 NOTE — Progress Notes (Signed)
The patient is receiving Protonix by the intravenous route.  Based on criteria approved by the Pharmacy and [place], the medication is being converted to the equivalent oral dose form.  These criteria include: -No Active GI bleeding -Able to tolerate diet of full liquids (or better) or tube feeding OR able to tolerate other medications by the oral or enteral route  If you have any questions about this conversion, please contact the Pharmacy Department (ext [phone]).  Thank you.  [name], [facility] 01/19/2013 11:55 AM

## 2013-01-19 NOTE — Progress Notes (Signed)
[facility] PROGRESS NOTE  [name] NFA:[id number] DOB: [birth year] DOA: 01/07/2013 PCP: Leonides Grills, MD  Assessment/Plan: Small bowel obstruction with right inguinal hernia status post hernia reduction in emergency room. NG discontinued per surgery 01/11/13. Diet was downgraded to clear liquids on 01/14/2013 after he had a large volume bilious emesis. Erythromycin and some of his other oral medications were withheld temporarily. Reglan was started at 5 mg every 12 hours. Zofran frequency was decreased to every 6 hours and Phenergan was discontinued to avoid CNS side effects. His digoxin level was ordered to rule out toxicity and it was within normal limits. Erythromycin resumed for breakfast time only for now. Daily BM's for last 4 days. Recurrent nausea/vomiting after breakfast this am. denies abdominal pain. Abdominal exam unchanged. Will change  Zofran and reglan to IV as pt refusing po meds. Dr Arnoldo Morale continues to favor conservative management as the patient would be very high risk for surgery. He remains afebrile with normal white count. Repeat CT on 01/10/13 yields persistent diffuse fluid filled and dilated small bowel up to about 4 cm in maximum diameter. There is contrast material in the low colonic lumen today suggesting that some of the oral contrast consumed 3 days prior has migrated through into the colon. As such, imaging features would remain compatible with at least a high-grade partial obstruction. A definite transition zone is not identified, but is probably in the mid to distal small bowel.   Acute on chronic renal failure. Creatinine remains continues to trend downward. Appreciate nephrology consult. Adjustments in IV fluids today per renal. [place] without hydronephrosis. Will monitor.   Atrial fibrillation with RVR, likely exacerbated by dehydration. Rapid rate now resolved. Heart rate range 76-89. Blood pressure trending up somewhat. Appreciate cardiology assistance with rate  management. Not candidate for anticoagulation due to underlying liver disease and tendency to bleed. Status post digoxin loading. Echocardiogram reveals preserved left ventricular systolic function with an ejection fraction of 60%, but with severe biatrial dilatation. Followup EKG yielded afib rvr LBBB. Continue current management.   Diabetes. CBG range 140. Contine sliding scale insulin for now. Resume basal insulin once oral intake is consistent.   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 Stable   Leukocytosis. resolved. No fever. Likely related to underlying abdominal process. Continue to follow. May also be related to the hemoconcentration   Anemia, chronic, iron deficiency and GI blood loss. No signs of active bleeding. Was transfused 1 unit of PRBC on 1/15. Posttransfusion CBC is 8.5. Stool FOBT was negative.   Hypotension. Improved. SBP range 117-170 Continue IV fluids per renal. Does not appear to be septic or toxic.  Hyperkalemia: Resolved. Continue to monitor  Hypokalemia: mild. Resolved.  Metabolic acidosis: Improved. PO bicarb started per renal. Monitor   Code Status: full Family Communication: none present Disposition Plan: facility once stable   Consultants:  Surgery  Renal     Procedures:  none  Antibiotics:  Zosyn 01/08/13>>01/16/13  HPI/Subjective: Lying in bed. Reports worsening nausea and recurrent vomiting this am. Denies abdominal pain/distention  Objective: Filed Vitals:   01/19/13 0539  BP: 119/66  Pulse: 88  Temp: 98 F (36.7 C)  Resp: 16    Intake/Output Summary (Last 24 hours) at 01/19/13 1249 Last data filed at 01/19/13 0900  Gross per 24 hour  Intake    490 ml  Output      2 ml  Net    488 ml   Filed Weights   01/16/13 0556 01/17/13 0358 01/19/13  [id number]  Weight: 93.4 kg (205 lb 14.6 oz) 95.8 kg (211 lb 3.2 oz) 102.8 kg (226 lb 10.1 oz)    Exam:   General:  Chronically ill appearing. frail  Cardiovascular:    Irregularly  irregular no m/g/r  Respiratory: normal effort BS clear bilaterally  Abdomen: non-distended but soft +BS x4. Non-tender to palpation  Musculoskeletal: no clubbing or cyanosis  Data Reviewed: Basic Metabolic Panel:  Recent Labs Lab 01/15/13 0530 01/16/13 0558 01/17/13 0557 01/18/13 0538 01/19/13 0631  NA 139 138 136* 135* 136*  K 3.9 3.8 3.6* 3.7 3.8  CL 104 102 104 104 102  CO2 18* 18* 17* 17* 19  GLUCOSE 106* 98 104* 138* 150*  BUN 72* 71* 64* 62* 54*  CREATININE 4.77* 4.64* 4.27* 3.82* 3.21*  CALCIUM 7.5* 7.4* 7.0* 7.1* 7.7*   Liver Function Tests: No results found for this basename: AST, ALT, ALKPHOS, BILITOT, PROT, ALBUMIN,  in the last 168 hours No results found for this basename: LIPASE, AMYLASE,  in the last 168 hours  Recent Labs Lab 01/13/13 0646  AMMONIA 20   CBC:  Recent Labs Lab 01/13/13 0645 01/15/13 0530 01/17/13 0557 01/18/13 0538 01/19/13 0631  WBC 6.4 10.3 7.0 6.4 8.0  HGB 8.6* 8.7* 7.9* 7.5* 8.5*  HCT 25.9* 24.2* 22.4* 20.7* 23.0*  MCV 91.2 90.6 89.2 88.5 87.1  PLT 68* 72* 71* 74* 93*   Cardiac Enzymes: No results found for this basename: CKTOTAL, CKMB, CKMBINDEX, TROPONINI,  in the last 168 hours BNP (last 3 results) No results found for this basename: PROBNP,  in the last 8760 hours CBG:  Recent Labs Lab 01/18/13 1120 01/18/13 1636 01/18/13 2015 01/19/13 0757 01/19/13 1125  GLUCAP 206* 148* 206* 143* 141*    No results found for this or any previous visit (from the past 240 hour(s)).   Studies: No results found.  Scheduled Meds: . digoxin  0.125 mg Oral Q48H  . erythromycin  250 mg Oral Q breakfast  . feeding supplement (RESOURCE BREEZE)  1 Container Oral TID BM  . insulin aspart  0-15 Units Subcutaneous TID WC  . metoCLOPramide  5 mg Oral BID AC  . ondansetron  4 mg Oral TID WC  . pantoprazole  40 mg Oral BID AC  . sodium bicarbonate  650 mg Oral BID  . sodium chloride  3 mL Intravenous Q12H   Continuous  Infusions: . sodium chloride 0.45 % 1,000 mL with sodium bicarbonate 50 mEq infusion 75 mL/hr at 01/19/13 0806    Principal Problem:   SBO (small bowel obstruction) Active Problems:   Cirrhosis of liver without mention of alcohol   Chronic kidney disease   NASH (nonalcoholic steatohepatitis)   Anemia   Thrombocytopenia   Diabetes   Dehydration   Acute renal failure   Inguinal hernia   Small bowel obstruction   Malnutrition of moderate degree   Metabolic acidosis   Hypokalemia   Atrial fibrillation with rapid ventricular response    Time spent: 35 minutes    [facility] Pager [phone]. If 7PM-7AM, please contact night-coverage at [email or web address], password [facility] 01/19/2013, 12:49 PM  LOS: 12 days   Attending note:  Patient seen and examined. The above note reviewed.  During my visit today, patient was repeatedly vomiting into. He reports that he tried to eat breakfast this morning but ended up vomiting. He reports continued bowel movements, he is not passing flatus. Does not have any significant abdominal distention.  Small bowel  instruction. Clinically it appeared to resolve. He has now developed recurrent vomiting. Will check KUB. Change diet to liquids, give Reglan and Zofran. If there is no significant obstruction, may need to consult gastroenterology.  Renal failure appears to be gradually improving with IV fluids. Nephrology following.  Atrial fibrillation is controlled on current regimen. He is not a candidate for anticoagulation due to history of bleeding and the high risk of bleeding.  [name]

## 2013-01-19 NOTE — Clinical Social Work Note (Signed)
CSW updated [facility] on pt. Facility willing to accept pt at any point over weekend if stable. CSW will continue to follow.  Benay Pike, [place]

## 2013-01-19 NOTE — Progress Notes (Signed)
Patient has episode of emesis.  Patient only has po Zofran and Reglan.  Dr. Roderic Palau notified.

## 2013-01-19 NOTE — Progress Notes (Signed)
Subjective: Interval History: Patient with intermitent nausea and vomiting . Over all feels better fills better. Denies any difficulty in breathing  Objective: Vital signs in last 24 hours: Temp:  [97.8 F (36.6 C)-98.5 F (36.9 C)] 98 F (36.7 C) (01/16 0539) Pulse Rate:  [76-88] 88 (01/16 0539) Resp:  [16-20] 16 (01/16 0539) BP: (112-128)/(54-71) 119/66 mmHg (01/16 0539) SpO2:  [98 %-100 %] 98 % (01/16 0539) Weight:  [102.8 kg (226 lb 10.1 oz)] 102.8 kg (226 lb 10.1 oz) (01/16 0433) Weight change:   Intake/Output from previous day: 01/15 0701 - 01/16 0700 In: 370 [P.O.:120; I.V.:250] Out: 500 [Urine:500] Intake/Output this shift:    Patient is somelemt but arouisable Chest is clear Heart RRR  Abdomen soft none tender and posetive bowl sound Extremities trace edema  Lab Results:  Recent Labs  01/18/13 0538 01/19/13 0631  WBC 6.4 8.0  HGB 7.5* 8.5*  HCT 20.7* 23.0*  PLT 74* 93*   BMET:   Recent Labs  01/18/13 0538 01/19/13 0631  NA 135* 136*  K 3.7 3.8  CL 104 102  CO2 17* 19  GLUCOSE 138* 150*  BUN 62* 54*  CREATININE 3.82* 3.21*  CALCIUM 7.1* 7.7*   No results found for this basename: PTH,  in the last 72 hours Iron Studies:  No results found for this basename: IRON, TIBC, TRANSFERRIN, FERRITIN,  in the last 72 hours  Studies/Results: No results found.  I have reviewed the patient's current medications.  Assessment/Plan: Problem #1 acute kidney injury superimposed on chronic. His BUN is 54 and creatinine is 3.21 . His renal function is progressively improving  . Marland Kitchen Problem #2 diabetes Problem #3 history of liver cirrhosis Problem #4 small bowel obstruction. S/P reduction today seems to feel better Problem #5 hypotension: His blood pressure has normalized Problem #6 anemia: His H/H is low Problem #7 A trial fibrillation  Problem #8 metabolic acidosis.CO2 low but improving. Patient presently on sodium bicarbonate  Plan: decrease iv to 75  cc/hr We'll check his basic metabolic panel in the morning.    LOS: 12 days   [place] Churchill S 01/19/2013,7:56 AM

## 2013-01-20 LAB — GLUCOSE, CAPILLARY
GLUCOSE-CAPILLARY: 108 mg/dL — AB (ref 70–99)
Glucose-Capillary: 122 mg/dL — ABNORMAL HIGH (ref 70–99)
Glucose-Capillary: 82 mg/dL (ref 70–99)
Glucose-Capillary: 105 mg/dL — ABNORMAL HIGH (ref 70–99)

## 2013-01-20 LAB — BASIC METABOLIC PANEL
BUN: 49 mg/dL — ABNORMAL HIGH (ref 6–23)
CHLORIDE: 105 meq/L (ref 96–112)
CO2: 22 mEq/L (ref 19–32)
Calcium: 7.8 mg/dL — ABNORMAL LOW (ref 8.4–10.5)
Creatinine, Ser: 2.94 mg/dL — ABNORMAL HIGH (ref 0.50–1.35)
GFR calc non Af Amer: 21 mL/min — ABNORMAL LOW (ref >90)
GFR, EST AFRICAN AMERICAN: 24 mL/min — AB (ref >90)
Glucose, Bld: 112 mg/dL — ABNORMAL HIGH (ref 70–99)
Potassium: 3.9 mEq/L (ref 3.7–5.3)
SODIUM: 139 meq/L (ref 137–147)

## 2013-01-20 LAB — CBC
HEMATOCRIT: 23.7 % — AB (ref 39.0–52.0)
Hemoglobin: 8.5 g/dL — ABNORMAL LOW (ref 13.0–17.0)
MCH: 31.5 pg (ref 26.0–34.0)
MCHC: 35.9 g/dL (ref 30.0–36.0)
MCV: 87.8 fL (ref 78.0–100.0)
PLATELETS: 109 10*3/uL — AB (ref 150–400)
RBC: 2.7 MIL/uL — ABNORMAL LOW (ref 4.22–5.81)
RDW: 15.8 % — ABNORMAL HIGH (ref 11.5–15.5)
WBC: 7.7 10*3/uL (ref 4.0–10.5)

## 2013-01-20 LAB — PREPARE RBC (CROSSMATCH)

## 2013-01-20 MED ORDER — FUROSEMIDE 10 MG/ML IJ SOLN
20.0000 mg | Freq: Once | INTRAMUSCULAR | Status: AC
  Administered 2013-01-21: 20 mg via INTRAVENOUS
  Filled 2013-01-20: qty 2

## 2013-01-20 NOTE — Progress Notes (Signed)
  Subjective: Patient has mild nausea. Emesis yesterday evening. Did have a bowel movement.  Objective: Vital signs in last 24 hours: Temp:  [97.7 F (36.5 C)-98.1 F (36.7 C)] 97.7 F (36.5 C) (01/17 0507) Pulse Rate:  [86-91] 87 (01/17 0507) Resp:  [18] 18 (01/17 0507) BP: (112-116)/(64-65) 114/64 mmHg (01/17 0507) SpO2:  [96 %-98 %] 96 % (01/17 0507) Last BM Date: 01/19/13  Intake/Output from previous day: 01/16 0701 - 01/17 0700 In: 240 [P.O.:240] Out: 352 [Urine:351; Stool:1] Intake/Output this shift: Total I/O In: -  Out: 200 [Urine:200]  GI: Soft, mildly distended, no rigidity noted. No incarcerated right inguinal hernia noted  Lab Results:   Recent Labs  01/19/13 0631 01/20/13 0541  WBC 8.0 7.7  HGB 8.5* 8.5*  HCT 23.0* 23.7*  PLT 93* 109*   BMET  Recent Labs  01/19/13 0631 01/20/13 0541  NA 136* 139  K 3.8 3.9  CL 102 105  CO2 19 22  GLUCOSE 150* 112*  BUN 54* 49*  CREATININE 3.21* 2.94*  CALCIUM 7.7* 7.8*   PT/INR No results found for this basename: LABPROT, INR,  in the last 72 hours  Studies/Results: Dg Abd Portable 1v  01/19/2013   CLINICAL DATA:  Nausea and vomiting and right lower abdominal discomfort  EXAM: PORTABLE ABDOMEN - 1 VIEW  COMPARISON:  CT scan of the abdomen and pelvis dated July 1st 2015  FINDINGS: Are multiple loops of mildly distended gas-filled small bowel stacked throughout the abdomen. There is gaseous distention of the stomach. There is no more than a tiny amount of gas within the rectum. No significant colonic gas is visible.  IMPRESSION: The findings are consistent with a mid to distal small bowel obstruction. Somewhat more bowel gas is visible today than on the previous CT scan.   Electronically Signed   By: David  Martinique   On: 01/19/2013 20:35    Anti-infectives: Anti-infectives   Start     Dose/Rate Route Frequency Ordered Stop   01/16/13 0800  erythromycin (ERY-TAB) EC tablet 250 mg     250 mg Oral Daily with  breakfast 01/15/13 1859     01/14/13 1200  erythromycin (E-MYCIN) tablet 250 mg  Status:  Discontinued     250 mg Oral 3 times daily with meals & bedtime 01/14/13 1002 01/14/13 1539   01/09/13 1400  piperacillin-tazobactam (ZOSYN) IVPB 2.25 g  Status:  Discontinued     2.25 g 100 mL/hr over 30 Minutes Intravenous 3 times per day 01/09/13 0953 01/16/13 1107   01/08/13 2200  piperacillin-tazobactam (ZOSYN) IVPB 3.375 g  Status:  Discontinued     3.375 g 12.5 mL/hr over 240 Minutes Intravenous 3 times per day 01/08/13 1943 01/09/13 1660      Assessment/Plan: Impression: Partial small bowel obstruction, not resolving. Renal insufficiency, resolving. Anemia secondary to chronic disease thrombocytopenia secondary to splenomegaly. Plan: As patient continues to improve from a renal standpoint, I think we now have to consider an exploratory laparotomy do to his ongoing issues with with his partial small bowel obstruction. This may be secondary to a stricture or intermittent obstruction from internal hernia or right inguinal hernia. Would transfuse 3 units packed red blood cells today. We'll reevaluate in a.m. I've explained to the patient that he is a high-risk surgical candidate. He will think about his decision as to whether to proceed with surgery.  LOS: 13 days    [name] A 01/20/2013

## 2013-01-20 NOTE — Progress Notes (Signed)
Subjective: Interval History: No new complaint. Patient still has som nausea but not comiting  Objective: Vital signs in last 24 hours: Temp:  [97.7 F (36.5 C)-98.1 F (36.7 C)] 97.7 F (36.5 C) (01/17 0507) Pulse Rate:  [86-91] 87 (01/17 0507) Resp:  [18] 18 (01/17 0507) BP: (112-116)/(64-65) 114/64 mmHg (01/17 0507) SpO2:  [96 %-98 %] 96 % (01/17 0507) Weight change:   Intake/Output from previous day: 01/16 0701 - 01/17 0700 In: 240 [P.O.:240] Out: 352 [Urine:351; Stool:1] Intake/Output this shift: Total I/O In: -  Out: 200 [Urine:200]  Patient is somelemt but arouisable Chest is clear Heart RRR  Abdomen soft none tender and posetive bowl sound Extremities trace edema  Lab Results:  Recent Labs  01/19/13 0631 01/20/13 0541  WBC 8.0 7.7  HGB 8.5* 8.5*  HCT 23.0* 23.7*  PLT 93* 109*   BMET:   Recent Labs  01/19/13 0631 01/20/13 0541  NA 136* 139  K 3.8 3.9  CL 102 105  CO2 19 22  GLUCOSE 150* 112*  BUN 54* 49*  CREATININE 3.21* 2.94*  CALCIUM 7.7* 7.8*   No results found for this basename: PTH,  in the last 72 hours Iron Studies:  No results found for this basename: IRON, TIBC, TRANSFERRIN, FERRITIN,  in the last 72 hours  Studies/Results: Dg Abd Portable 1v  01/19/2013   CLINICAL DATA:  Nausea and vomiting and right lower abdominal discomfort  EXAM: PORTABLE ABDOMEN - 1 VIEW  COMPARISON:  CT scan of the abdomen and pelvis dated July 1st 2015  FINDINGS: Are multiple loops of mildly distended gas-filled small bowel stacked throughout the abdomen. There is gaseous distention of the stomach. There is no more than a tiny amount of gas within the rectum. No significant colonic gas is visible.  IMPRESSION: The findings are consistent with a mid to distal small bowel obstruction. Somewhat more bowel gas is visible today than on the previous CT scan.   Electronically Signed   By: David  Martinique   On: 01/19/2013 20:35    I have reviewed the patient's current  medications.  Assessment/Plan: Problem #1 acute kidney injury superimposed on chronic. His BUN is 49 and creatinine is 2.94 . His renal function isimproving  .  Marland Kitchen Problem #2 diabetes Problem #3 history of liver cirrhosis Problem #4 small bowel obstruction. S/P reduction today seems to feel better Problem #5 hypertension his blood pressure is reasonable ocntrolled Problem #6 anemia: His H/H is stable Problem #7 A trial fibrillation  Problem #8 metabolic acidosis has improved his co2 is 22 . Patient on sodium bicarbonate Problem#9 hypernatremia has corrected Plan: continue with present treatment We'll check his basic metabolic panel in the morning.    LOS: 13 days   [name] S 01/20/2013,9:15 AM

## 2013-01-20 NOTE — Progress Notes (Signed)
[facility] PROGRESS NOTE  [name] WJX:[id number] DOB: April 21, 1945 DOA: 01/07/2013 PCP: Leonides Grills, MD  Assessment/Plan: Small bowel obstruction with right inguinal hernia status post hernia reduction in emergency room. Patient initially required NG tube decompression after admission. He has been followed by general surgery. Conservative management, he initially improved. NG tube was discontinued and he was started on clear liquids. This was advanced to solid food. Unfortunately after diet was advanced to solid food, patient began vomiting again. Repeat abdominal film showed continued bowel obstruction. Discussed the case with Dr. Arnoldo Morale, and it is felt that the patient may require surgical intervention. We'll transfuse the patient with PRBCs in preparation for surgery. If the patient is agreeable, surgery will tentatively be planned for 1/19   Acute on chronic renal failure. Creatinine remains continues to trend downward. Appreciate nephrology consult. Adjustments in IV fluids today per renal. [place] without hydronephrosis. Will monitor.   Atrial fibrillation with RVR, likely exacerbated by dehydration. Rapid rate now resolved. Heart rate range 76-89. Blood pressure trending up somewhat. Appreciate cardiology assistance with rate management. Not candidate for anticoagulation due to underlying liver disease and tendency to bleed. Status post digoxin loading. Echocardiogram reveals preserved left ventricular systolic function with an ejection fraction of 60%, but with severe biatrial dilatation. Followup EKG yielded afib rvr LBBB. Continue current management.   Diabetes. CBG range 140. Continue sliding scale insulin for now. Resume basal insulin once oral intake is consistent.   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 Improving   Leukocytosis. resolved. No fever. Likely related to underlying abdominal process. Continue to follow. May also be related to the hemoconcentration    Anemia, chronic, iron deficiency and GI blood loss. No signs of active bleeding. Was transfused 1 unit of PRBC on 1/15. Per request of surgery, we'll transfuse 2 more units of PRBCs in preparation for possible surgery. Stool FOBT was negative.   Hypotension. Improved. SBP range 117-170 Continue IV fluids per renal. Does not appear to be septic or toxic.  Hyperkalemia: Resolved. Continue to monitor  Hypokalemia: mild. Resolved.  Metabolic acidosis: Improved. PO bicarb started per renal. Monitor   Code Status: full Family Communication: none present Disposition Plan: facility once stable   Consultants:  Surgery  Renal     Procedures:  none  Antibiotics:  Zosyn 01/08/13>>01/16/13  HPI/Subjective: Lying in bed. Has not had any vomiting today. He does have nausea. He had a bowel movement yesterday.  Objective: Filed Vitals:   01/20/13 1501  BP: 118/52  Pulse: 88  Temp: 98.1 F (36.7 C)  Resp: 18    Intake/Output Summary (Last 24 hours) at 01/20/13 2008 Last data filed at 01/20/13 1200  Gross per 24 hour  Intake    100 ml  Output    350 ml  Net   -250 ml   Filed Weights   01/17/13 0358 01/19/13 0433 01/20/13 1300  Weight: 95.8 kg (211 lb 3.2 oz) 102.8 kg (226 lb 10.1 oz) 100 kg (220 lb 7.4 oz)    Exam:   General:  Chronically ill appearing. frail  Cardiovascular:   S1, S2, regular rate and rhythm no m/g/r  Respiratory: normal effort BS clear bilaterally  Abdomen: non-distended but soft +BS x4. Non-tender to palpation  Musculoskeletal: no clubbing or cyanosis  Data Reviewed: Basic Metabolic Panel:  Recent Labs Lab 01/16/13 0558 01/17/13 0557 01/18/13 0538 01/19/13 0631 01/20/13 0541  NA 138 136* 135* 136* 139  K 3.8 3.6* 3.7 3.8 3.9  CL 102 104  104 102 105  CO2 18* 17* 17* 19 22  GLUCOSE 98 104* 138* 150* 112*  BUN 71* 64* 62* 54* 49*  CREATININE 4.64* 4.27* 3.82* 3.21* 2.94*  CALCIUM 7.4* 7.0* 7.1* 7.7* 7.8*   Liver Function Tests: No  results found for this basename: AST, ALT, ALKPHOS, BILITOT, PROT, ALBUMIN,  in the last 168 hours No results found for this basename: LIPASE, AMYLASE,  in the last 168 hours No results found for this basename: AMMONIA,  in the last 168 hours CBC:  Recent Labs Lab 01/15/13 0530 01/17/13 0557 01/18/13 0538 01/19/13 0631 01/20/13 0541  WBC 10.3 7.0 6.4 8.0 7.7  HGB 8.7* 7.9* 7.5* 8.5* 8.5*  HCT 24.2* 22.4* 20.7* 23.0* 23.7*  MCV 90.6 89.2 88.5 87.1 87.8  PLT 72* 71* 74* 93* 109*   Cardiac Enzymes: No results found for this basename: CKTOTAL, CKMB, CKMBINDEX, TROPONINI,  in the last 168 hours BNP (last 3 results) No results found for this basename: PROBNP,  in the last 8760 hours CBG:  Recent Labs Lab 01/19/13 1648 01/19/13 2108 01/20/13 0746 01/20/13 1148 01/20/13 1627  GLUCAP 135* 122* 122* 82 105*    No results found for this or any previous visit (from the past 240 hour(s)).   Studies: Dg Abd Portable 1v  01/19/2013   CLINICAL DATA:  Nausea and vomiting and right lower abdominal discomfort  EXAM: PORTABLE ABDOMEN - 1 VIEW  COMPARISON:  CT scan of the abdomen and pelvis dated July 1st 2015  FINDINGS: Are multiple loops of mildly distended gas-filled small bowel stacked throughout the abdomen. There is gaseous distention of the stomach. There is no more than a tiny amount of gas within the rectum. No significant colonic gas is visible.  IMPRESSION: The findings are consistent with a mid to distal small bowel obstruction. Somewhat more bowel gas is visible today than on the previous CT scan.   Electronically Signed   By: David  Martinique   On: 01/19/2013 20:35    Scheduled Meds: . digoxin  0.125 mg Oral Q48H  . erythromycin  250 mg Oral Q breakfast  . feeding supplement (RESOURCE BREEZE)  1 Container Oral TID BM  . furosemide  20 mg Intravenous Once  . insulin aspart  0-15 Units Subcutaneous TID WC  . metoCLOPramide (REGLAN) injection  5 mg Intravenous Q12H  . ondansetron   4 mg Oral TID WC  . pantoprazole  40 mg Oral BID AC  . sodium bicarbonate  650 mg Oral BID  . sodium chloride  3 mL Intravenous Q12H   Continuous Infusions: . sodium chloride 0.45 % 1,000 mL with sodium bicarbonate 50 mEq infusion 75 mL/hr at 01/20/13 1200    Principal Problem:   SBO (small bowel obstruction) Active Problems:   Cirrhosis of liver without mention of alcohol   Chronic kidney disease   NASH (nonalcoholic steatohepatitis)   Anemia   Thrombocytopenia   Diabetes   Dehydration   Acute renal failure   Inguinal hernia   Small bowel obstruction   Malnutrition of moderate degree   Metabolic acidosis   Hypokalemia   Atrial fibrillation with rapid ventricular response    Time spent: 35 minutes    [facility] Pager [phone]. If 7PM-7AM, please contact night-coverage at [email or web address], password [facility] 01/20/2013, 8:08 PM  LOS: 13 days

## 2013-01-20 NOTE — Progress Notes (Signed)
[facility] (Transplant List) [phone] and [place] [phone]) MUST CONSULT before any procedure. Patient requesting to be transferred to [facility] for any type of procedure or surgery.

## 2013-01-21 LAB — BASIC METABOLIC PANEL
BUN: 46 mg/dL — AB (ref 6–23)
CO2: 21 mEq/L (ref 19–32)
CREATININE: 2.74 mg/dL — AB (ref 0.50–1.35)
Calcium: 7.8 mg/dL — ABNORMAL LOW (ref 8.4–10.5)
Chloride: 101 mEq/L (ref 96–112)
GFR calc Af Amer: 26 mL/min — ABNORMAL LOW (ref >90)
GFR calc non Af Amer: 22 mL/min — ABNORMAL LOW (ref >90)
Glucose, Bld: 144 mg/dL — ABNORMAL HIGH (ref 70–99)
Potassium: 3.9 mEq/L (ref 3.7–5.3)
Sodium: 136 mEq/L — ABNORMAL LOW (ref 137–147)

## 2013-01-21 LAB — CBC
HCT: 30.2 % — ABNORMAL LOW (ref 39.0–52.0)
Hemoglobin: 10.9 g/dL — ABNORMAL LOW (ref 13.0–17.0)
MCH: 31.1 pg (ref 26.0–34.0)
MCHC: 36.1 g/dL — ABNORMAL HIGH (ref 30.0–36.0)
MCV: 86.3 fL (ref 78.0–100.0)
Platelets: 106 10*3/uL — ABNORMAL LOW (ref 150–400)
RBC: 3.5 MIL/uL — AB (ref 4.22–5.81)
RDW: 16.3 % — ABNORMAL HIGH (ref 11.5–15.5)
WBC: 6.1 10*3/uL (ref 4.0–10.5)

## 2013-01-21 LAB — CLOSTRIDIUM DIFFICILE BY PCR: CDIFFPCR: NEGATIVE

## 2013-01-21 LAB — GLUCOSE, CAPILLARY
GLUCOSE-CAPILLARY: 95 mg/dL (ref 70–99)
Glucose-Capillary: 127 mg/dL — ABNORMAL HIGH (ref 70–99)

## 2013-01-21 MED ORDER — ONDANSETRON HCL 4 MG/2ML IJ SOLN: 4.0000 mg | Freq: Four times a day (QID) | INTRAMUSCULAR | Status: DC | PRN

## 2013-01-21 MED ORDER — DIGOXIN 125 MCG PO TABS: 0.1250 mg | ORAL_TABLET | ORAL | Status: DC

## 2013-01-21 MED ORDER — SODIUM BICARBONATE 650 MG PO TABS: 650.0000 mg | ORAL_TABLET | Freq: Two times a day (BID) | ORAL | Status: DC

## 2013-01-21 MED ORDER — PANTOPRAZOLE SODIUM 40 MG PO TBEC: 40.0000 mg | DELAYED_RELEASE_TABLET | Freq: Two times a day (BID) | ORAL | Status: DC

## 2013-01-21 NOTE — Progress Notes (Signed)
Report called to Jeani Hawking, nurse at [facility].

## 2013-01-21 NOTE — Progress Notes (Signed)
Subjective: Interval History: No new complaint. Patient denies any nausea or vomiting  Objective: Vital signs in last 24 hours: Temp:  [97.7 F (36.5 C)-98.4 F (36.9 C)] 98 F (36.7 C) (01/18 0840) Pulse Rate:  [77-114] 85 (01/18 0840) Resp:  [18] 18 (01/18 0840) BP: (104-123)/(52-69) 123/68 mmHg (01/18 0840) SpO2:  [93 %-99 %] 96 % (01/18 0840) Weight:  [100 kg (220 lb 7.4 oz)] 100 kg (220 lb 7.4 oz) (01/17 1300) Weight change:   Intake/Output from previous day: 01/17 0701 - 01/18 0700 In: 8917.5 [P.O.:460; I.V.:7482.5; Blood:975] Out: 1250 [Urine:1000; Stool:250] Intake/Output this shift: Total I/O In: 562 [P.O.:562] Out: -   Patient is alert in no apparent distress Chest is clear. No rales or rhonchi Heart RRR  Abdomen soft none tender and posetive bowl sound Extremities trace edema  Lab Results:  Recent Labs  01/19/13 0631 01/20/13 0541  WBC 8.0 7.7  HGB 8.5* 8.5*  HCT 23.0* 23.7*  PLT 93* 109*   BMET:   Recent Labs  01/19/13 0631 01/20/13 0541  NA 136* 139  K 3.8 3.9  CL 102 105  CO2 19 22  GLUCOSE 150* 112*  BUN 54* 49*  CREATININE 3.21* 2.94*  CALCIUM 7.7* 7.8*   No results found for this basename: PTH,  in the last 72 hours Iron Studies:  No results found for this basename: IRON, TIBC, TRANSFERRIN, FERRITIN,  in the last 72 hours  Studies/Results: Dg Abd Portable 1v  01/19/2013   CLINICAL DATA:  Nausea and vomiting and right lower abdominal discomfort  EXAM: PORTABLE ABDOMEN - 1 VIEW  COMPARISON:  CT scan of the abdomen and pelvis dated July 1st 2015  FINDINGS: Are multiple loops of mildly distended gas-filled small bowel stacked throughout the abdomen. There is gaseous distention of the stomach. There is no more than a tiny amount of gas within the rectum. No significant colonic gas is visible.  IMPRESSION: The findings are consistent with a mid to distal small bowel obstruction. Somewhat more bowel gas is visible today than on the previous CT  scan.   Electronically Signed   By: David  Martinique   On: 01/19/2013 20:35    I have reviewed the patient's current medications.  Assessment/Plan: Problem #1 acute kidney injury superimposed on chronic. His BUN is 49 and creatinine is 2.94 . His renal function isimproving  . No new blood work today. Problem #2 diabetes Problem #3 history of liver cirrhosis Problem #4 small bowel obstruction. S/P reduction today seems to feel better Problem #5 hypertension his blood pressure is reasonable ocntrolled Problem #6 anemia: His  Hemoglobin is low presently patient is receiving blood transfussion Problem #7 A trial fibrillation  Problem #8 metabolic acidosis has improved his co2 is 22 . Patient on sodium bicarbonate Problem#9 hypernatremia has corrected Plan: continue with present treatment We'll check his basic metabolic panel in the morning.    LOS: 14 days   Kalev Temme S 01/21/2013,9:40 AM

## 2013-01-21 NOTE — Progress Notes (Signed)
RN spoke to [facility] transported.  [facility] transport not available at this time.  Stated that [facility] will be transporting patient and will call us with pick-up time.

## 2013-01-21 NOTE — Discharge Summary (Signed)
Physician Discharge Summary  [name] [id number] DOB: 12-Jan-1945 DOA: 01/07/2013  PCP: Leonides Grills, MD  Admit date: 01/07/2013 Discharge date: 01/21/2013  Time spent: 60 minutes  Recommendations for Outpatient Follow-up:  1. Patient will be transferred to [facility] for further surgery evaluation for partial small bowel obstruction  Discharge Diagnoses:  Principal Problem:   Partial SBO (small bowel obstruction) Active Problems:   Cirrhosis of liver without mention of alcohol   Chronic kidney disease   NASH (nonalcoholic steatohepatitis)   Anemia   Thrombocytopenia   Diabetes   Dehydration   Acute renal failure   Right Inguinal hernia, reduced in ED   Malnutrition of moderate degree   Metabolic acidosis   Hypokalemia   Atrial fibrillation with rapid ventricular response, resolved, now in sinus   Discharge Condition: stable  Diet recommendation: clear liquids at the time of discharge  Filed Weights   01/17/13 0358 01/19/13 0433 01/20/13 1300  Weight: 95.8 kg (211 lb 3.2 oz) 102.8 kg (226 lb 10.1 oz) 100 kg (220 lb 7.4 oz)    History of present illness:  [name] is a 68 y.o. male with a history of multiple medical problems, presents the emergency room with onset of abdominal pain. Patient reports that his right lower abdominal pain began yesterday evening at around 8 PM. This was associated with persistent vomiting. He's had approximately 5-6 episodes of vomiting since then. He's not been able to keep any food down. His vomitus is nonbloody. He reports one bowel movement before yesterday which was noted to be hard. He was able to pass flatus yesterday but has not moved any since then. He denies any fever, shortness of breath, cough, chest pain. He does not have any history of bowel obstructions in the past. He was noted to have a right inguinal hernia in the emergency room. Patient unaware of how long it has been there. Case was discussed with surgery and  the hernia was reduced in the emergency room. CT scan of the abdomen and pelvis indicated a small bowel obstruction. Patient will be admitted for further treatment.   Hospital Course:  This is a medically complex patient with a history of NASH cirrhosis currently on transplant list at [facility], status post TIPS procedure, chronic kidney disease with a baseline creatinine of 1.9 as well as atrial fibrillation. Patient had presented to the emergency room at [facility] on 01/07/13 with complaints of abdominal pain, nausea and vomiting. He was found to have a right inguinal hernia and CT evidence of small bowel obstruction. Right inguinal hernia was reduced by ER physician. Patient was admitted to a regular medical floor and was monitored on telemetry. He was seen by general surgery who opted to treat the patient conservatively due to his multiple medical problems and high risk for surgery. The patient had continued vomiting and NG tube was placed for decompression. After several days of observation, the patient did not significantly improve. He began to have a significant elevation in his WBC count to 18,000. He was empirically started on Zosyn and CT scan of the abdomen and pelvis was repeated which showed features consistent with at least a high-grade partial bowel obstruction. The patient was treated conservatively and initially began to improve. NG tube was eventually discontinued when he started having bowel movements and the patient was started on clear liquids. This was slowly advanced to a mechanical soft diet. Within one to 2 days, patient began having persistent, large  volume vomiting again. Repeat KUB indicated a mid to distal small bowel obstruction. Although the patient continues to have bowel movements, he appears to be failing conservative therapy since he has continued vomiting after eating solid food. This was discussed with general surgery at [facility] and it was felt that he may  need exploratory laparotomy. It was unclear whether patient had a bowel stricture or an internal hernia leading to his recurrent vomiting. He had failed conservative therapy over the past 14 days. When this was discussed with patient, he indicated that he was on transplant list at [facility] and that any necessary surgery would need to be done at [facility]. We were also contacted by the transplant center at [facility] confirming that he was on the transplant list, patient's sister also confirmed this fact. I discussed the case with Dr. Juanda Crumble on-call for general surgery at [facility] who has accepted patient in transfer.  The patient does have chronic kidney disease with a baseline creatinine of 1.9. On admission, it was noted that his creatinine was elevated at 3.0. This was felt to be related to dehydration and hypotension. He was seen by nephrology who started the patient on IV fluids. Renal ultrasound did not indicate any obstructive process. The patient had a peak serum creatinine of 5.3. Since that time, his renal function has started to improve and creatinine is currently 2.7 with daily improvement.  Patient did develop an episode of atrial fibrillation with rapid ventricular response. Management was challenging since the patient did have a borderline blood pressure. He was seen by cardiology and started on low-dose digoxin. Since that time his heart rate has improved. He is not a candidate for anticoagulation due to his history of esophageal varices and GI bleeding.  His anemia appears to be chronic in nature, likely due to chronic disease. He does not have any signs of ongoing bleeding and stools are negative for occult blood. Yesterday he was transfused a total of 3 units of PRBCs for hemoglobin of 8.5. This was in anticipation for surgery. Currently his hemoglobin is 10.9. He also has chronic thrombocytopenia from his liver disease. Current platelet count is 106.    Procedures: 2D  echo 01/12/13: - Study data: Technically adequate study. - Left ventricle: The cavity size was normal. There was focal basal hypertrophy without dynamic obstruction. Systolic function was normal. The estimated ejection fraction was in the range of 60% to 65%. Wall motion was normal; there were no regional wall motion abnormalities. - Aortic valve: Moderately calcified annulus. Moderately thickened leaflets. - Mitral valve: Moderately calcified annulus. Mildly thickened leaflets . - Left atrium: The atrium was severely dilated. - Right ventricle: The cavity size was mildly dilated. - Right atrium: The atrium was severely dilated. - Tricuspid valve: Moderate regurgitation. - Pulmonary arteries: PA systolic pressure is borderline elevated. PA peak pressure: 17mm Hg (S). - Pericardium, extracardiac: There is fluid collection around the liver consistent with ascites.    Consultations: General Surgery, Dr. Aviva Signs Nephrology, Dr. Lowanda Foster Cardiology, Dr. Harl Bowie  Discharge Exam: Filed Vitals:   01/21/13 1459  BP: 132/73  Pulse: 99  Temp: 98.4 F (36.9 C)  Resp: 18    General: NAD Cardiovascular: S1, S2 RRR Respiratory: CTA B Abdomen: soft, tenderness in the right lower quadrant, sluggish bowel sounds Ext: no cyanosis or clubbing  Discharge Instructions      Discharge Orders   Future Appointments Provider Department Dept Phone   03/07/2013 10:00 AM [facility]  [place] [phone]   03/07/2013 11:00 AM [facility] Covering Provider [place] [phone]   Future Orders Complete By Expires   Increase activity slowly  As directed        Medication List    STOP taking these medications       insulin glargine 100 UNIT/ML injection  Commonly known as:  LANTUS     lactulose 10 GM/15ML solution  Commonly known as:  CHRONULAC     lisinopril 5 MG tablet  Commonly known as:  PRINIVIL,ZESTRIL     mirtazapine 15 MG tablet  Commonly known  as:  REMERON     traZODone 50 MG tablet  Commonly known as:  DESYREL      TAKE these medications       ALPRAZolam 0.5 MG tablet  Commonly known as:  XANAX  Take 0.5 mg by mouth daily as needed.     digoxin 0.125 MG tablet  Commonly known as:  LANOXIN  Take 1 tablet (0.125 mg total) by mouth every other day.     gabapentin 300 MG capsule  Commonly known as:  NEURONTIN  Take 300 mg by mouth at bedtime.     neomycin-polymyxin-hydrocortisone otic solution  Commonly known as:  CORTISPORIN  Place 2 drops into the right ear 3 (three) times daily.     NOVOLOG 100 UNIT/ML injection  Generic drug:  insulin aspart  Inject 0-12 Units into the skin 2 (two) times daily. Patient is on Sliding Scale     ondansetron 4 MG/2ML Soln injection  Commonly known as:  ZOFRAN  Inject 2 mLs (4 mg total) into the vein every 6 (six) hours as needed for nausea, vomiting or refractory nausea / vomiting.     pantoprazole 40 MG tablet  Commonly known as:  PROTONIX  Take 1 tablet (40 mg total) by mouth 2 (two) times daily before a meal.     simvastatin 40 MG tablet  Commonly known as:  ZOCOR  40 mg. Take 40 mg by mouth nightly.     sodium bicarbonate 650 MG tablet  Take 1 tablet (650 mg total) by mouth 2 (two) times daily.       Allergies  Allergen Reactions  . Aspirin Other (See Comments)    Causes pt. To bleed too freely  . Codeine Other (See Comments)    Unable to move limbs after taking Codeine Can't function - legs won't move,etc.  . Tricode Ar [Pseudoeph-Chlorphen-Codeine]   . Other Rash    Adhesive tape  . Tape Rash      The results of significant diagnostics from this hospitalization (including imaging, microbiology, ancillary and laboratory) are listed below for reference.    Significant Diagnostic Studies: Ct Abdomen Pelvis Wo Contrast  01/10/2013   CLINICAL DATA:  Esophageal varices. Status post TIPS. GI bleed. Small bowel obstruction.  EXAM: CT ABDOMEN AND PELVIS WITHOUT  CONTRAST  TECHNIQUE: Multidetector CT imaging of the abdomen and pelvis was performed following the standard protocol without intravenous contrast.  COMPARISON:  01/07/2013  FINDINGS: Imaging through the lung bases shows persistent left base collapse/consolidation with loculated left pleural effusion.  Liver contour is nodular. The spleen is enlarged. NG tube tip is seen in the distal stomach. Duodenum is unremarkable. Pancreas has normal on infused features. Gallbladder is surgically absent. Portosystemic shunt in the central liver is again noted. No evidence for focal adrenal lesion. Left lower pole renal cyst again noted. No hydronephrosis in either kidney.  No abdominal aortic aneurysm.  No lymphadenopathy in the abdomen.  Diffuse distension of small bowel in the abdomen is again noted. The dilated small bowel loops are fluid filled. Dilated small bowel measures up to 4.0 cm in diameter. Small amount of free fluid is seen in the abdomen.  Imaging through the pelvis shows small volume intraperitoneal free fluid. Colon is nondistended. Contrast material in the colon is probably from the previous CT scan. The distal ileum and terminal ileum are decompressed. The appendix is normal.  Umbilical hernia contains fat, but no small bowel. A discrete transition zone in the small bowel is not evident on the current exam small bowel is again seen protruding into a shallow right inguinal hernia in wall there was an apparent transition zone in this region on the previous study, there is no definite transition zone in this region today.  Right inguinal hernia contains fat and fluid, but no bowel. Bones are diffusely demineralized with evidence of spinal fusion in the thoracolumbar region.  IMPRESSION: Persistent diffuse fluid filled and dilated small bowel up to about 4 cm in maximum diameter. There is contrast material in the low colonic lumen today suggesting that some of the oral contrast consumed 3 days ago has migrated  through into the colon. As such, imaging features today would remain compatible with at least a high-grade partial obstruction. A definite transition zone is not identified, but is probably in the mid to distal small bowel.   Electronically Signed   By: Misty Stanley M.D.   On: 01/10/2013 12:16   Ct Abdomen Pelvis Wo Contrast  01/07/2013   CLINICAL DATA:  SBO, R inguinal hernia (reduced on ER) SBO, R inguinal hernia (reduced on ER), ER patient with a hx of HTN, diabetes, A-FIB, Crohn's disease, NASH, esophageal varices, hiatal hernia, TIPS procedure, gallbladder surgery, heart cath, thoracic spine surgery.  EXAM: CT ABDOMEN AND PELVIS WITHOUT CONTRAST  TECHNIQUE: Multidetector CT imaging of the abdomen and pelvis was performed following the standard protocol without intravenous contrast.  COMPARISON:  DG ABD ACUTE W/CHEST dated 01/07/2013; US ABDOMEN COMPLETE dated 11/09/2012; CT ENTERO ABD W/CM dated 03/04/2008; CT ENTERO PELVIS W/CM dated 03/04/2008  FINDINGS: There is a small loculated left pleural effusion. There is a left basilar airspace disease which may represent atelectasis versus pneumonia.  The liver is diminutive in size with a nodular contour consistent with cirrhosis. There is no intrahepatic or extrahepatic biliary ductal dilatation. The gallbladder is surgically absent. The spleen is mildly enlarged without focal abnormality. There is a hypodense, fluid attenuating left renal mass most consistent with a cyst. The right kidney, adrenal glands and pancreas are normal. The bladder is unremarkable.  There is small bowel dilatation with multiple small bowel air-fluid levels with an apparent transition zone at the right inguinal hernia.  There is no pneumoperitoneum, pneumatosis, or portal venous gas. There is no abdominal or pelvic free fluid. There is no lymphadenopathy.  The abdominal aorta is normal in caliber with atherosclerosis. There is a TIPS stent present. There are no lytic or sclerotic osseous  lesions. There is lumbar spine spondylosis. There is thoracic spine posterior spinal fusion incompletely visualized.  IMPRESSION: 1. Small bowel obstruction with an apparent transition zone and a partially reduced right inguinal hernia. The distal small bowel and colon are decompressed. 2. Cirrhosis with a TIPS stent present. 3. Loculated left pleural effusion with left basilar airspace disease which may represent atelectasis versus pneumonia.   Electronically Signed   By: Elbert Ewings  Patel   On: 01/07/2013 16:41   US Renal  01/10/2013   CLINICAL DATA:  Worsening renal failure  EXAM: RENAL/URINARY TRACT ULTRASOUND COMPLETE  COMPARISON:  CT 01/07/2013  FINDINGS: Right Kidney:  Length: 10.7 cm. Echogenicity within normal limits. No mass or hydronephrosis visualized.  Left Kidney:  Length: 11.0 cm. Normal renal cortical thickness and echogenicity. No hydronephrosis. There is a 4.4 cm cyst off the inferior pole of the left kidney.  Bladder:  Decompressed with Foley catheter in place.  IMPRESSION: No hydronephrosis   Electronically Signed   By: Lovey Newcomer M.D.   On: 01/10/2013 08:24   Dg Abd Acute W/chest  01/07/2013   CLINICAL DATA:  Patient unable to stand or move.  EXAM: ACUTE ABDOMEN SERIES (ABDOMEN 2 VIEW & CHEST 1 VIEW)  COMPARISON:  04/21/2011.  FINDINGS: Chronic opacity in the mid to lower left hemithorax appears similar to prior studies favored to represent chronic scarring and moderate chronic left pleural effusion. Right lung appears clear. Probable skin fold projecting over the lower right hemithorax. No evidence of pulmonary edema. Heart size is normal. Mediastinal contours are partially obscured on the left, but otherwise within normal limits. Atherosclerosis in the thoracic aorta. Orthopedic fixation hardware in the lower thoracic and upper lumbar spine.  Multiple views of the abdomen are slightly limited by underpenetration. However, there are several dilated loops of gas-filled small bowel in the central  abdomen, measuring up to 4.6 cm in diameter. There is a paucity of colonic gas. No frank pneumoperitoneum. Surgical clips and a stent project over the region of the liver, suggesting prior TIPS.  IMPRESSION: 1. Findings, as above, concerning for small bowel obstruction. 2. No pneumoperitoneum at this time. 3. The appearance the chest is similar to prior studies, with chronic moderate left pleural effusion and probable pleuroparenchymal scarring in the base of the left hemithorax. 4. Additional incidental findings, as above.   Electronically Signed   By: Vinnie Langton M.D.   On: 01/07/2013 12:01   Dg Abd Portable 1v  01/19/2013   CLINICAL DATA:  Nausea and vomiting and right lower abdominal discomfort  EXAM: PORTABLE ABDOMEN - 1 VIEW  COMPARISON:  CT scan of the abdomen and pelvis dated July 1st 2015  FINDINGS: Are multiple loops of mildly distended gas-filled small bowel stacked throughout the abdomen. There is gaseous distention of the stomach. There is no more than a tiny amount of gas within the rectum. No significant colonic gas is visible.  IMPRESSION: The findings are consistent with a mid to distal small bowel obstruction. Somewhat more bowel gas is visible today than on the previous CT scan.   Electronically Signed   By: David  Martinique   On: 01/19/2013 20:35    Microbiology: Recent Results (from the past 240 hour(s))  CLOSTRIDIUM DIFFICILE BY PCR     Status: None   Collection Time    01/21/13 12:31 AM      Result Value Range Status   C difficile by pcr NEGATIVE  NEGATIVE Final     Labs: Basic Metabolic Panel:  Recent Labs Lab 01/17/13 0557 01/18/13 0538 01/19/13 0631 01/20/13 0541 01/21/13 1158  NA 136* 135* 136* 139 136*  K 3.6* 3.7 3.8 3.9 3.9  CL 104 104 102 105 101  CO2 17* 17* 19 22 21   GLUCOSE 104* 138* 150* 112* 144*  BUN 64* 62* 54* 49* 46*  CREATININE 4.27* 3.82* 3.21* 2.94* 2.74*  CALCIUM 7.0* 7.1* 7.7* 7.8* 7.8*   Liver  Function Tests: No results found for this  basename: AST, ALT, ALKPHOS, BILITOT, PROT, ALBUMIN,  in the last 168 hours No results found for this basename: LIPASE, AMYLASE,  in the last 168 hours No results found for this basename: AMMONIA,  in the last 168 hours CBC:  Recent Labs Lab 01/17/13 0557 01/18/13 0538 01/19/13 0631 01/20/13 0541 01/21/13 1158  WBC 7.0 6.4 8.0 7.7 6.1  HGB 7.9* 7.5* 8.5* 8.5* 10.9*  HCT 22.4* 20.7* 23.0* 23.7* 30.2*  MCV 89.2 88.5 87.1 87.8 86.3  PLT 71* 74* 93* 109* 106*   Cardiac Enzymes: No results found for this basename: CKTOTAL, CKMB, CKMBINDEX, TROPONINI,  in the last 168 hours BNP: BNP (last 3 results) No results found for this basename: PROBNP,  in the last 8760 hours CBG:  Recent Labs Lab 01/20/13 1148 01/20/13 1627 01/20/13 2146 01/21/13 0723 01/21/13 1203  GLUCAP 82 105* 108* 95 127*       Signed:  Arland Usery  [facility] 01/21/2013, 3:58 PM

## 2013-01-21 NOTE — Progress Notes (Signed)
Patient has stated that if any surgical procedure needs to be done, he would like it done at [facility]. Though he does not have an acute surgical problem, he has continued to have evidence of partial small bowel obstruction. He does also have a known right inguinal hernia which is reducible. He has failed conservative treatment for this. This has been discussed with Dr. Roderic Palau.  He will make the appropriate referral.

## 2013-01-22 LAB — TYPE AND SCREEN
ABO/RH(D): O POS
ANTIBODY SCREEN: NEGATIVE
UNIT DIVISION: 0
Unit division: 0
Unit division: 0

## 2013-01-22 MED FILL — Ondansetron HCl Inj 4 MG/2ML (2 MG/ML): INTRAMUSCULAR | Qty: 2 | Status: AC

## 2013-03-07 ENCOUNTER — Other Ambulatory Visit (HOSPITAL_COMMUNITY): Payer: Self-pay

## 2013-03-07 ENCOUNTER — Ambulatory Visit (HOSPITAL_COMMUNITY): Payer: Self-pay

## 2013-03-08 NOTE — Progress Notes (Signed)
This encounter was created in error - please disregard.

## 2013-03-28 ENCOUNTER — Encounter (HOSPITAL_COMMUNITY): Payer: Medicare Other | Attending: Hematology and Oncology

## 2013-03-28 ENCOUNTER — Encounter (HOSPITAL_BASED_OUTPATIENT_CLINIC_OR_DEPARTMENT_OTHER): Payer: Medicare Other

## 2013-03-28 ENCOUNTER — Other Ambulatory Visit (HOSPITAL_COMMUNITY): Payer: Self-pay

## 2013-03-28 ENCOUNTER — Encounter (HOSPITAL_COMMUNITY): Payer: Self-pay

## 2013-03-28 VITALS — BP 88/50 | HR 68 | Temp 97.3°F | Resp 18 | Wt 185.2 lb

## 2013-03-28 DIAGNOSIS — D696 Thrombocytopenia, unspecified: Secondary | ICD-10-CM

## 2013-03-28 DIAGNOSIS — E611 Iron deficiency: Secondary | ICD-10-CM

## 2013-03-28 DIAGNOSIS — D731 Hypersplenism: Secondary | ICD-10-CM

## 2013-03-28 DIAGNOSIS — D649 Anemia, unspecified: Secondary | ICD-10-CM

## 2013-03-28 DIAGNOSIS — K219 Gastro-esophageal reflux disease without esophagitis: Secondary | ICD-10-CM | POA: Insufficient documentation

## 2013-03-28 DIAGNOSIS — I2789 Other specified pulmonary heart diseases: Secondary | ICD-10-CM | POA: Insufficient documentation

## 2013-03-28 DIAGNOSIS — N183 Chronic kidney disease, stage 3 unspecified: Secondary | ICD-10-CM | POA: Insufficient documentation

## 2013-03-28 DIAGNOSIS — Z09 Encounter for follow-up examination after completed treatment for conditions other than malignant neoplasm: Secondary | ICD-10-CM | POA: Insufficient documentation

## 2013-03-28 DIAGNOSIS — Z8589 Personal history of malignant neoplasm of other organs and systems: Secondary | ICD-10-CM | POA: Insufficient documentation

## 2013-03-28 DIAGNOSIS — D519 Vitamin B12 deficiency anemia, unspecified: Secondary | ICD-10-CM

## 2013-03-28 DIAGNOSIS — K7460 Unspecified cirrhosis of liver: Secondary | ICD-10-CM

## 2013-03-28 DIAGNOSIS — Z923 Personal history of irradiation: Secondary | ICD-10-CM | POA: Insufficient documentation

## 2013-03-28 DIAGNOSIS — D509 Iron deficiency anemia, unspecified: Secondary | ICD-10-CM

## 2013-03-28 DIAGNOSIS — K7689 Other specified diseases of liver: Secondary | ICD-10-CM | POA: Insufficient documentation

## 2013-03-28 DIAGNOSIS — Z794 Long term (current) use of insulin: Secondary | ICD-10-CM | POA: Insufficient documentation

## 2013-03-28 DIAGNOSIS — I4891 Unspecified atrial fibrillation: Secondary | ICD-10-CM | POA: Insufficient documentation

## 2013-03-28 DIAGNOSIS — I129 Hypertensive chronic kidney disease with stage 1 through stage 4 chronic kidney disease, or unspecified chronic kidney disease: Secondary | ICD-10-CM | POA: Insufficient documentation

## 2013-03-28 DIAGNOSIS — E119 Type 2 diabetes mellitus without complications: Secondary | ICD-10-CM | POA: Insufficient documentation

## 2013-03-28 LAB — CBC WITH DIFFERENTIAL/PLATELET
BASOS PCT: 1 % (ref 0–1)
Basophils Absolute: 0 10*3/uL (ref 0.0–0.1)
EOS PCT: 3 % (ref 0–5)
Eosinophils Absolute: 0.2 10*3/uL (ref 0.0–0.7)
HEMATOCRIT: 29.3 % — AB (ref 39.0–52.0)
Hemoglobin: 9.9 g/dL — ABNORMAL LOW (ref 13.0–17.0)
Lymphocytes Relative: 15 % (ref 12–46)
Lymphs Abs: 0.9 10*3/uL (ref 0.7–4.0)
MCH: 31.6 pg (ref 26.0–34.0)
MCHC: 33.8 g/dL (ref 30.0–36.0)
MCV: 93.6 fL (ref 78.0–100.0)
MONO ABS: 0.6 10*3/uL (ref 0.1–1.0)
Monocytes Relative: 10 % (ref 3–12)
Neutro Abs: 4.3 10*3/uL (ref 1.7–7.7)
Neutrophils Relative %: 71 % (ref 43–77)
PLATELETS: 97 10*3/uL — AB (ref 150–400)
RBC: 3.13 MIL/uL — ABNORMAL LOW (ref 4.22–5.81)
RDW: 16.8 % — AB (ref 11.5–15.5)
Smear Review: DECREASED
WBC: 6 10*3/uL (ref 4.0–10.5)

## 2013-03-28 LAB — FERRITIN: Ferritin: 244 ng/mL (ref 22–322)

## 2013-03-28 NOTE — Progress Notes (Signed)
[place]  OFFICE PROGRESS NOTE  Leonides Grills, MD [address] [place] [address]  DIAGNOSIS: Cirrhosis of liver without mention of alcohol - Plan: CBC with Differential, Basic metabolic panel, Transferrin Receptor, Soluable, Ferritin  Hypersplenism - Plan: CBC with Differential, Basic metabolic panel, Transferrin Receptor, Soluable, Ferritin  Iron deficiency - Plan: CBC with Differential, Basic metabolic panel, Transferrin Receptor, Soluable, Ferritin, Transferrin Receptor, Soluable, Transferrin Receptor, Soluable  Chief Complaint  Patient presents with  . Cirrhosis with hypersplenism and iron deficiency    CURRENT THERAPY: Intermittent iron infusions for iron deficiency in the setting of NASH-related cirrhosis with hypersplenism.  INTERVAL HISTORY: [name] 68 y.o. 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 He was hospitalized at [facility]. on 01/07/2013 with a small bowel obstruction secondary to an incarcerated right inguinal hernia. He was transferred to [facility] in [place] where the hernia was manually repositioned without surgical intervention. It would not recommend that he wear any type of hernia belt afterwards. He denies any melena but did have one episode of bright red rectal bleeding recently. He denies any PND, orthopnea, palpitations and is no longer taking anticoagulants. He denies any fever, night sweats, cough, wheezing, lower extremity swelling or redness, hematochezia, cough, wheezing, or abdominal distention.  MEDICAL HISTORY: Past Medical History  Diagnosis Date  . Hypertension     negative stress nuclear study in 02/2007; normal coronaries in 2006; Echo in 2006-aortic sclerosis, Mild biatrial enlargement; normal LV  . Pulmonary hypertension     Mild pulmonary hypertension  with normal PVR by 2010 [facility])  . Hyperlipidemia   . Syncope 2008    orthostatic hypotension  . Diabetes mellitus     insulin; onset in 1999; retinopathy, neuropathy and nephropathy  . Chronic atrial fibrillation     onset in 1995; anticoagulation discontinued in 2006  . Crohn's disease     -lower GI bleed in 08/2002;  Pentasa stopped by Dr. Marty Heck at [facility] ; clinical remission.  . Obesity   . NASH (nonalcoholic steatohepatitis)     cirrhosis with esophageal varices; h/o GI bleeding. EGD-2010 grade 2 esophageal varices-banded with good hemostasis; portal gastropathy, friable gastric mucosa; hiatal hernia.  EGD- recurrent melena one week later showed 2 eschars at previously placed bands, decompressed esophageal varices grade 1-2,four columns, no bleeding stigmata, normal appearing small bowel, pt has had 1 Hep A/B vaccine.    . Esophageal varices     s/p band ligation multiple times, Portal Hypertensive gastropathy;  . S/P TIPS (transjugular intrahepatic portosystemic shunt)     06/2009-TIPS procedure at [facility]  . GI bleed     07/2009-admitted with recurrent upper GI bleed secondary to esophageal varices-banded x3  . Pleural effusion, left     s/p thoracentesis-multiple  . Depression   . Anemia     Iron def requiring parenteral iron or injections in past, followed by Dr. Everardo All; B12 def  . Duodenal ulcer 1980s  . Ascites   . Hiatal hernia 12/02/2009  . Esophageal erosions     consistent with mild erosive reflux esophagitis  . Portal hypertensive gastropathy   . 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 Gala Romney  . Cancer 10/2011  . Squamous cell carcinoma of face   . Pneumonia     long time  ago  . Shortness of breath   . GERD (gastroesophageal reflux disease)   . Arthritis     knees & ankles   . S/P radiation therapy 01/07/12 thru 03/21/2012    Dr. Orlene Erm; Right cheek/parotid and neck nodes  . Chronic kidney disease     stage III; creatinine of 1.6 in 8/07,  1.35 and 05/2008  . Nephrolithiasis     cystoscopy with stone extraction in 1990,   . Morbid obesity 09/20/2012  . Coagulopathy 09/20/2012    INTERIM HISTORY: has HYPERLIPIDEMIA; UMBILICAL HERNIA; Cirrhosis of liver without mention of alcohol; Hypertension; Pulmonary hypertension; Diabetes mellitus; Chronic atrial fibrillation; Chronic kidney disease; NASH (nonalcoholic steatohepatitis); Esophageal varices; S/P TIPS (transjugular intrahepatic portosystemic shunt); Anemia; Epigastric pain; Heme positive stool; Edema of left lower extremity; Iron deficiency anemia; B12 deficiency anemia; Upper GI bleed; Melena; Blood loss anemia; Thrombocytopenia; Chronic diarrhea; Abdominal pain; Chronic periodontitis; Carotid bruit; Diabetes; Dehydration; Orthostatic hypotension; Hypotension, unspecified; Acute renal failure; Hypovolemia; Generalized weakness; Morbid obesity; Coagulopathy; Urinary hesitancy; SBO (small bowel obstruction); Inguinal hernia; Small bowel obstruction; Malnutrition of moderate degree; Metabolic acidosis; Hypokalemia; and Atrial fibrillation with rapid ventricular response on his problem list.    ALLERGIES:  is allergic to aspirin; codeine; tricode ar; other; and tape.  MEDICATIONS: has a current medication list which includes the following prescription(s): digoxin, gabapentin, insulin aspart, neomycin-polymyxin-hydrocortisone, pantoprazole, simvastatin, sodium bicarbonate, alprazolam, and ondansetron.  SURGICAL HISTORY:  Past Surgical History  Procedure Laterality Date  . Tips procedure  09/16/09  . 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 Cholecystectomy    . Circumcision  -1992  . Colonoscopy  -2008    Reportedly normal, done in [place]  . Multiple thoracenteses    . Esophagogastroduodenoscopy  12/02/2009    distal esophageal erosions, small hiatal hernia  . Tips procedure  2011  . Colonoscopy  01/26/2011    RMR:  adequate preparation.  normal rectum. Somewhat  diffusely friable colonic mucosa. A single 4 mm polyp in the ascending segment. couple of mucosal breaks in the distal 10 cm of terminal ileum with  a couple of erosions at the junction  the TI  and colon.. Tubular adenoma, chronic ileitis. Next TCS 01/2016.  Marland Kitchen Esophagogastroduodenoscopy  01/26/2011    RMR: Decompressed/abscent esophagel varices s/p TIPS. small hh. GAVE. Procedure: ESOPHAGOGASTRODUODENOSCOPY (EGD);  Surgeon: Daneil Dolin, MD;  [facility];  Service: Endoscopy;  Laterality: N/A;  . Esophagogastroduodenoscopy  04/22/2011    RMR: GAVE.-status post APC ablation as described above  . Esophagogastroduodenoscopy  05/20/2011    RMR: Portal gastropathy.  GAVE - status post APC ablation  . Hot hemostasis  05/20/2011    Procedure: HOT HEMOSTASIS (ARGON PLASMA COAGULATION/BICAP);  Surgeon: Daneil Dolin, MD;  Location: [facility];  Service: Endoscopy;  Laterality: N/A;  . Esophagogastroduodenoscopy  07/14/2005    Persisting two columns of grade 2 esophageal varices with one cherry red spot on one of these columns distally.  Otherwise normal  . Skin cancer excision  Oct 2013    large lesion rt side of face  . Cardiac catheterization       04/12/2008 [facility]): no significant CAD, mild pulmonary hypertension with normal PVR, normal pulmonary capillary wedge pressure, normal left ventricular end-diastolic pressure.  . Multiple extractions with alveoloplasty  01/14/2012    Procedure: MULTIPLE EXTRACION WITH ALVEOLOPLASTY;  Surgeon: Lenn Cal, DDS;  Location: [facility];  Service:  Oral Surgery;  Laterality: N/A;  Extraction of tooth #'s [id number] with alveoloplasty and gross debridement of remaining teeth    FAMILY HISTORY: family history includes COPD in his mother; Diabetes in his mother and sister; Heart attack in his father; Heart disease in his mother; Heart failure in his mother; Hypertension in his mother and sister. There is no  history of Colon cancer, Inflammatory bowel disease, Liver disease, Anesthesia problems, Hypotension, Malignant hyperthermia, or Pseudochol deficiency.  SOCIAL HISTORY:  reports that he has never smoked. He has never used smokeless tobacco. He reports that he does not drink alcohol or use illicit drugs.  REVIEW OF SYSTEMS:  Other than that discussed above is noncontributory.  PHYSICAL EXAMINATION: ECOG PERFORMANCE STATUS: 1 - Symptomatic but completely ambulatory  Blood pressure 88/50, pulse 68, temperature 97.3 F (36.3 C), temperature source Oral, resp. rate 18, weight 185 lb 3.2 oz (84.006 kg), SpO2 99.00%.  GENERAL:alert, no distress and comfortable SKIN: skin color, texture, turgor are normal, no rashes or significant lesions EYES: PERLA; Conjunctiva are pink and non-injected, sclera clear SINUSES: No redness or tenderness over maxillary or ethmoid sinuses OROPHARYNX:no exudate, no erythema on lips, buccal mucosa, or tongue. NECK: supple, thyroid normal size, non-tender, without nodularity. No masses CHEST: Increased AP diameter with no evidence of spider angiomata. Bilateral gynecomastia is noted. LYMPH:  no palpable lymphadenopathy in the cervical, axillary or inguinal LUNGS: clear to auscultation and percussion with normal breathing effort HEART: regular rate & rhythm and no murmurs. ABDOMEN:abdomen soft, non-tender and normal bowel sounds. Umbilical hernia is present disease a reducible. Right inguinal hernia is present and is reducible today. There is no free fluid wave or shifting dullness. Liver and spleen not palpable. MUSCULOSKELETAL:no cyanosis of digits and no clubbing. Range of motion normal.  NEURO: alert & oriented x 3 with fluent speech, no focal motor/sensory deficits. No evidence of asterixis.   LABORATORY DATA: Infusion on 03/28/2013  Component Date Value Ref Range Status  . WBC 03/28/2013 6.0  4.0 - 10.5 K/uL Final  . RBC 03/28/2013 3.13* 4.22 - 5.81 MIL/uL  Final  . Hemoglobin 03/28/2013 9.9* 13.0 - 17.0 g/dL Final  . HCT 20/23/3435 29.3* 39.0 - 52.0 % Final  . MCV 03/28/2013 93.6  78.0 - 100.0 fL Final  . MCH 03/28/2013 31.6  26.0 - 34.0 pg Final  . MCHC 03/28/2013 33.8  30.0 - 36.0 g/dL Final  . RDW 68/61/6837 16.8* 11.5 - 15.5 % Final  . Platelets 03/28/2013 97* 150 - 400 K/uL Final   Comment: SPECIMEN CHECKED FOR CLOTS                          PLATELET COUNT CONFIRMED BY SMEAR  . Neutrophils Relative % 03/28/2013 71  43 - 77 % Final  . Neutro Abs 03/28/2013 4.3  1.7 - 7.7 K/uL Final  . Lymphocytes Relative 03/28/2013 15  12 - 46 % Final  . Lymphs Abs 03/28/2013 0.9  0.7 - 4.0 K/uL Final  . Monocytes Relative 03/28/2013 10  3 - 12 % Final  . Monocytes Absolute 03/28/2013 0.6  0.1 - 1.0 K/uL Final  . Eosinophils Relative 03/28/2013 3  0 - 5 % Final  . Eosinophils Absolute 03/28/2013 0.2  0.0 - 0.7 K/uL Final  . Basophils Relative 03/28/2013 1  0 - 1 % Final  . Basophils Absolute 03/28/2013 0.0  0.0 - 0.1 K/uL Final  . Smear Review 03/28/2013 PLATELETS APPEAR DECREASED  Final   LARGE PLATELETS PRESENT    PATHOLOGY: No new pathology.  Urinalysis    Component Value Date/Time   COLORURINE BROWN* 01/08/2013 1120   APPEARANCEUR HAZY* 01/08/2013 1120   LABSPEC >1.030* 01/08/2013 1120   PHURINE 5.0 01/08/2013 1120   GLUCOSEU NEGATIVE 01/08/2013 1120   HGBUR TRACE* 01/08/2013 1120   BILIRUBINUR SMALL* 01/08/2013 1120   KETONESUR NEGATIVE 01/08/2013 1120   PROTEINUR 100* 01/08/2013 1120   UROBILINOGEN 0.2 01/08/2013 1120   NITRITE NEGATIVE 01/08/2013 1120   LEUKOCYTESUR NEGATIVE 01/08/2013 1120    RADIOGRAPHIC STUDIES: CT Abdomen Pelvis Wo Contrast Status: Final result         PACS Images    Show images for CT Abdomen Pelvis Wo Contrast         Study Result    CLINICAL DATA: SBO, R inguinal hernia (reduced on ER) SBO, R  inguinal hernia (reduced on ER), ER patient with a hx of HTN,  diabetes, A-FIB, Crohn's disease, NASH, esophageal  varices, hiatal  hernia, TIPS procedure, gallbladder surgery, heart cath, thoracic  spine surgery.  EXAM:  CT ABDOMEN AND PELVIS WITHOUT CONTRAST  TECHNIQUE:  Multidetector CT imaging of the abdomen and pelvis was performed  following the standard protocol without intravenous contrast.  COMPARISON: DG ABD ACUTE W/CHEST dated 01/07/2013; US ABDOMEN  COMPLETE dated 11/09/2012; CT ENTERO ABD W/CM dated 03/04/2008; CT  ENTERO PELVIS W/CM dated 03/04/2008  FINDINGS:  There is a small loculated left pleural effusion. There is a left  basilar airspace disease which may represent atelectasis versus  pneumonia.  The liver is diminutive in size with a nodular contour consistent  with cirrhosis. There is no intrahepatic or extrahepatic biliary  ductal dilatation. The gallbladder is surgically absent. The spleen  is mildly enlarged without focal abnormality. There is a hypodense,  fluid attenuating left renal mass most consistent with a cyst. The  right kidney, adrenal glands and pancreas are normal. The bladder is  unremarkable.  There is small bowel dilatation with multiple small bowel air-fluid  levels with an apparent transition zone at the right inguinal  hernia.  There is no pneumoperitoneum, pneumatosis, or portal venous gas.  There is no abdominal or pelvic free fluid. There is no  lymphadenopathy.  The abdominal aorta is normal in caliber with atherosclerosis. There  is a TIPS stent present. There are no lytic or sclerotic osseous  lesions. There is lumbar spine spondylosis. There is thoracic spine  posterior spinal fusion incompletely visualized.  IMPRESSION:  1. Small bowel obstruction with an apparent transition zone and a  partially reduced right inguinal hernia. The distal small bowel and  colon are decompressed.  2. Cirrhosis with a TIPS stent present.  3. Loculated left pleural effusion with left basilar airspace  disease which may represent atelectasis versus pneumonia.    Electronically Signed  By: Kathreen Devoid  On: 01/07/2013    CT Abdomen Pelvis Wo Contrast Status: Final result         PACS Images    Show images for CT Abdomen Pelvis Wo Contrast         Study Result    CLINICAL DATA: Esophageal varices. Status post TIPS. GI bleed.  Small bowel obstruction.  EXAM:  CT ABDOMEN AND PELVIS WITHOUT CONTRAST  TECHNIQUE:  Multidetector CT imaging of the abdomen and pelvis was performed  following the standard protocol without intravenous contrast.  COMPARISON: 01/07/2013  FINDINGS:  Imaging through the lung bases shows persistent left base  collapse/consolidation with  loculated left pleural effusion.  Liver contour is nodular. The spleen is enlarged. NG tube tip is  seen in the distal stomach. Duodenum is unremarkable. Pancreas has  normal on infused features. Gallbladder is surgically absent.  Portosystemic shunt in the central liver is again noted. No evidence  for focal adrenal lesion. Left lower pole renal cyst again noted. No  hydronephrosis in either kidney.  No abdominal aortic aneurysm. No lymphadenopathy in the abdomen.  Diffuse distension of small bowel in the abdomen is again noted. The  dilated small bowel loops are fluid filled. Dilated small bowel  measures up to 4.0 cm in diameter. Small amount of free fluid is  seen in the abdomen.  Imaging through the pelvis shows small volume intraperitoneal free  fluid. Colon is nondistended. Contrast material in the colon is  probably from the previous CT scan. The distal ileum and terminal  ileum are decompressed. The appendix is normal.  Umbilical hernia contains fat, but no small bowel. A discrete  transition zone in the small bowel is not evident on the current  exam small bowel is again seen protruding into a shallow right  inguinal hernia in wall there was an apparent transition zone in  this region on the previous study, there is no definite transition  zone in this region  today.  Right inguinal hernia contains fat and fluid, but no bowel. Bones  are diffusely demineralized with evidence of spinal fusion in the  thoracolumbar region.  IMPRESSION:  Persistent diffuse fluid filled and dilated small bowel up to about  4 cm in maximum diameter. There is contrast material in the low  colonic lumen today suggesting that some of the oral contrast  consumed 3 days ago has migrated through into the colon. As such,  imaging features today would remain compatible with at least a  high-grade partial obstruction. A definite transition zone is not  identified, but is probably in the mid to distal small bowel.  Electronically Signed  By: Misty Stanley M.D.  On: 01/10/2013      ASSESSMENT:  #1. 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 Episode of right inguinal incarcerated hernia with small bowel obstruction, relieved conservatively with replacement.    PLAN:  #1. If ferritin and was cycled transferrin receptor are consistent with iron deficiency, patient will be given intravenous iron. #2. He was told to continue on his current medications. #3. Followup in 3 months with CBC, ferritin, and soluble transferrin receptor. #4. Consider wearing a hernia belt.   All questions were answered. The patient knows to call the clinic with any problems, questions or concerns. We can certainly see the patient much sooner if necessary.   I spent 25 minutes counseling the patient face to face. The total time spent in the appointment was 30 minutes.    Doroteo Bradford, MD 03/28/2013 10:17 AM

## 2013-03-28 NOTE — Patient Instructions (Signed)
[place] Discharge Instructions  RECOMMENDATIONS MADE BY THE CONSULTANT AND ANY TEST RESULTS WILL BE SENT TO YOUR REFERRING PHYSICIAN. We will see you in 3 months for lab work and a doctor visit. Please get the hernia belt. Thank you for choosing [facility] to provide your oncology and hematology care.  To afford each patient quality time with our providers, please arrive at least 15 minutes before your scheduled appointment time.  With your help, our goal is to use those 15 minutes to complete the necessary work-up to ensure our physicians have the information they need to help with your evaluation and healthcare recommendations.    Effective January 1st, 2014, we ask that you re-schedule your appointment with our physicians should you arrive 10 or more minutes late for your appointment.  We strive to give you quality time with our providers, and arriving late affects you and other patients whose appointments are after yours.    Again, thank you for choosing [facility].  Our hope is that these requests will decrease the amount of time that you wait before being seen by our physicians.       _____________________________________________________________  Should you have questions after your visit to [facility], please contact our office at [phone] between the hours of 8:30 a.m. and 5:00 p.m.  Voicemails left after 4:30 p.m. will not be returned until the following business day.  For prescription refill requests, have your pharmacy contact our office with your prescription refill request.

## 2013-03-28 NOTE — Addendum Note (Signed)
Addended byLouis Meckel on: 03/28/2013 10:23 AM   Modules accepted: Orders

## 2013-03-28 NOTE — Progress Notes (Signed)
Labs drawn today for cbc/diff,ferr 

## 2013-03-31 LAB — SOLUBLE TRANSFERRIN RECEPTOR: Transferrin Receptor, Soluble: 1.19 mg/L (ref 0.76–1.76)

## 2013-04-04 ENCOUNTER — Encounter: Payer: Self-pay | Admitting: Gastroenterology

## 2013-04-04 ENCOUNTER — Ambulatory Visit (INDEPENDENT_AMBULATORY_CARE_PROVIDER_SITE_OTHER): Payer: Medicare Other | Admitting: Gastroenterology

## 2013-04-04 VITALS — BP 94/55 | HR 76 | Temp 98.4°F | Ht 72.0 in | Wt 188.2 lb

## 2013-04-04 DIAGNOSIS — K4090 Unilateral inguinal hernia, without obstruction or gangrene, not specified as recurrent: Secondary | ICD-10-CM

## 2013-04-04 DIAGNOSIS — K7460 Unspecified cirrhosis of liver: Secondary | ICD-10-CM

## 2013-04-04 MED ORDER — RESTORA PO CAPS: 1.0000 | ORAL_CAPSULE | Freq: Every day | ORAL | Status: DC

## 2013-04-04 MED ORDER — PANTOPRAZOLE SODIUM 40 MG PO TBEC: 40.0000 mg | DELAYED_RELEASE_TABLET | Freq: Two times a day (BID) | ORAL | Status: AC

## 2013-04-04 NOTE — Assessment & Plan Note (Signed)
Stable. Hepatoma surveillance up to date given CT 01/2013. Has f/u with [facility] every six months. Continue lactulose and Xifaxan (samples provided).

## 2013-04-04 NOTE — Patient Instructions (Signed)
1. Start Restora one daily for four weeks. This hopefully will help with your gas symptoms. 2. Office visit in four weeks. If not better, then consider antibiotic for bacterial overgrowth. I discussed this approach with Dr. Gala Romney today. 3. New RX for pantoprazole capsules sent to your pharmacy. 4. Samples of Xifaxan provided.

## 2013-04-04 NOTE — Progress Notes (Signed)
Primary Care Physician:  Leonides Grills, MD  Primary Gastroenterologist:  Garfield Cornea, MD   Chief Complaint  Patient presents with  . Follow-up    HPI:  [name] is a 68 y.o. male here for f/u. Having problems with gas pains. 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 Crohn's disease has been in remission. Next TCS due in 01/2016. Weight down from 206 when last seen in 11/2012. Hospitalized for two weeks at [facility] in 01/2013 for partial SBO related to right inguinal hernia. Transfer to [facility]. Patient reports "I was near death." Hernia was reduced manually at [facility]. Patient went to rehab upon discharge. Seen in f/u at liver clinic at [facility] 02/2013. Goes every six months.   Patient now home. Complains of intermittent gas pain and abdominal cramping. No N/V. Bilateral inguinal hernia. Having trouble with left side. BM 3 times per day. No melena, brbpr. No heartburn. No peripheral edema. No episodes of HE.  Needs Xifaxan samples.   Current Outpatient Prescriptions  Medication Sig Dispense Refill  . atorvastatin (LIPITOR) 20 MG tablet Take 20 mg by mouth daily.      . digoxin (LANOXIN) 0.125 MG tablet Take 1 tablet (0.125 mg total) by mouth every other day.      . ferrous sulfate 325 (65 FE) MG tablet Take 325 mg by mouth daily with breakfast.      . furosemide (LASIX) 40 MG tablet Take 20 mg by mouth daily.      . insulin glargine (LANTUS) 100 UNIT/ML injection Inject 20 Units into the skin at bedtime.      . insulin lispro (HUMALOG) 100 UNIT/ML injection Inject into the skin 3 (three) times daily before meals.      . lactulose (CHRONULAC) 10 GM/15ML solution Take 30 g by mouth 2 (two) times daily.      . pantoprazole (PROTONIX) 40 MG tablet Take 1 tablet (40 mg total) by mouth 2 (two) times daily before a meal.      . propranolol (INDERAL) 20 MG tablet Take 20 mg by mouth 2 (two) times daily.      . rifaximin (XIFAXAN) 550 MG TABS tablet Take 550 mg by mouth.       . ALPRAZolam (XANAX) 0.5 MG tablet Take 0.5 mg by mouth daily as needed.       . gabapentin (NEURONTIN) 300 MG capsule Take 300 mg by mouth at bedtime.       . insulin aspart (NOVOLOG) 100 UNIT/ML injection Inject 0-12 Units into the skin 2 (two) times daily. Patient is on Sliding Scale      . simvastatin (ZOCOR) 40 MG tablet 40 mg. Take 40 mg by mouth nightly.      . sodium bicarbonate 650 MG tablet Take 1 tablet (650 mg total) by mouth 2 (two) times daily.       No current facility-administered medications for this visit.    Allergies as of 04/04/2013 - Review Complete 04/04/2013  Allergen Reaction Noted  . Aspirin Other (See Comments) 12/07/2012  . Codeine Other (See Comments)   . Tricode ar [pseudoeph-chlorphen-codeine]  05/27/2012  . Other Rash 05/05/2010  . Tape Rash 12/07/2012    Past Medical History  Diagnosis Date  . Hypertension     negative stress nuclear study in 02/2007; normal coronaries in 2006; Echo in 2006-aortic sclerosis, Mild biatrial enlargement; normal LV  . Pulmonary hypertension     Mild pulmonary hypertension with normal PVR by 2010 cath (  [facility])  . Hyperlipidemia   . Syncope 2008    orthostatic hypotension  . Diabetes mellitus     insulin; onset in 1999; retinopathy, neuropathy and nephropathy  . Chronic atrial fibrillation     onset in 1995; anticoagulation discontinued in 2006  . Crohn's disease     -lower GI bleed in 08/2002;  Pentasa stopped by Dr. Marty Heck at [facility] ; clinical remission.  . Obesity   . NASH (nonalcoholic steatohepatitis)     cirrhosis with esophageal varices; h/o GI bleeding. EGD-2010 grade 2 esophageal varices-banded with good hemostasis; portal gastropathy, friable gastric mucosa; hiatal hernia.  EGD- recurrent melena one week later showed 2 eschars at previously placed bands, decompressed esophageal varices grade 1-2,four columns, no bleeding stigmata, normal appearing small bowel, pt has had 1 Hep A/B vaccine.    . Esophageal  varices     s/p band ligation multiple times, Portal Hypertensive gastropathy;  . S/P TIPS (transjugular intrahepatic portosystemic shunt)     06/2009-TIPS procedure at [facility]  . GI bleed     07/2009-admitted with recurrent upper GI bleed secondary to esophageal varices-banded x3  . Pleural effusion, left     s/p thoracentesis-multiple  . Depression   . Anemia     Iron def requiring parenteral iron or injections in past, followed by Dr. Everardo All; B12 def  . Duodenal ulcer 1980s  . Ascites   . Hiatal hernia 12/02/2009  . Esophageal erosions     consistent with mild erosive reflux esophagitis  . Portal hypertensive gastropathy   . 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 Gala Romney  . Cancer 10/2011  . Squamous cell carcinoma of face   . Pneumonia     long time ago  . Shortness of breath   . GERD (gastroesophageal reflux disease)   . Arthritis     knees & ankles   . S/P radiation therapy 01/07/12 thru 03/21/2012    Dr. Orlene Erm; Right cheek/parotid and neck nodes  . Chronic kidney disease     stage III; creatinine of 1.6 in 8/07, 1.35 and 05/2008  . Nephrolithiasis     cystoscopy with stone extraction in 1990,   . Morbid obesity 09/20/2012  . Coagulopathy 09/20/2012    Past Surgical History  Procedure Laterality Date  . Tips procedure  09/16/09  . 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 Cholecystectomy    . Circumcision  -1992  . Colonoscopy  -2008    Reportedly normal, done in [place]  . Multiple thoracenteses    . Esophagogastroduodenoscopy  12/02/2009    distal esophageal erosions, small hiatal hernia  . Tips procedure  2011  . Colonoscopy  01/26/2011    RMR: adequate preparation.  normal rectum. Somewhat  diffusely friable colonic mucosa. A single 4 mm polyp in the ascending segment. couple of mucosal breaks in the distal 10 cm of terminal ileum with  a couple of erosions at the junction  the TI   and colon.. Tubular adenoma, chronic ileitis. Next TCS 01/2016.  Marland Kitchen Esophagogastroduodenoscopy  01/26/2011    RMR: Decompressed/abscent esophagel varices s/p TIPS. small hh. GAVE. Procedure: ESOPHAGOGASTRODUODENOSCOPY (EGD);  Surgeon: Daneil Dolin, MD;  Location: [facility];  Service: Endoscopy;  Laterality: N/A;  . Esophagogastroduodenoscopy  04/22/2011    RMR: GAVE.-status post APC ablation as described above  . Esophagogastroduodenoscopy  05/20/2011    RMR: Portal gastropathy.  GAVE - status post APC ablation  . Hot hemostasis  05/20/2011    Procedure: HOT HEMOSTASIS (ARGON PLASMA COAGULATION/BICAP);  Surgeon: Daneil Dolin, MD;  Location: [facility];  Service: Endoscopy;  Laterality: N/A;  . Esophagogastroduodenoscopy  07/14/2005    Persisting two columns of grade 2 esophageal varices with one cherry red spot on one of these columns distally.  Otherwise normal  . Skin cancer excision  Oct 2013    large lesion rt side of face  . Cardiac catheterization       04/12/2008 [facility]): no significant CAD, mild pulmonary hypertension with normal PVR, normal pulmonary capillary wedge pressure, normal left ventricular end-diastolic pressure.  . Multiple extractions with alveoloplasty  01/14/2012    Procedure: MULTIPLE EXTRACION WITH ALVEOLOPLASTY;  Surgeon: Lenn Cal, DDS;  Location: [place];  Service: Oral Surgery;  Laterality: N/A;  Extraction of tooth #'s [phone] with alveoloplasty and gross debridement of remaining teeth    Family History  Problem Relation Age of Onset  . Colon cancer Neg Hx   . Inflammatory bowel disease Neg Hx   . Liver disease Neg Hx   . Anesthesia problems Neg Hx   . Hypotension Neg Hx   . Malignant hyperthermia Neg Hx   . Pseudochol deficiency Neg Hx   . Heart attack Father   . COPD Mother   . Diabetes Mother   . Hypertension Mother   . Heart failure Mother   . Heart disease Mother   . Hypertension Sister   . Diabetes Sister     History   Social  History  . Marital Status: Divorced    Spouse Name: N/A    Number of Children: 1  . Years of Education: N/A   Occupational History  . retired     disabled   Social History Main Topics  . Smoking status: Never Smoker   . Smokeless tobacco: Never Used  . Alcohol Use: No  . Drug Use: No  . Sexual Activity: Yes    Birth Control/ Protection: None   Other Topics Concern  . Not on file   Social History Narrative   The patient is divorced. The patient had one son.   Patient denies ever having smoked or used smokeless tobacco products. Patient denies current use of alcohol.      Mother and father are both deceased.      ROS:  General: Negative for anorexia, weight stable at this point (actually gaining per patient), fever, chills. Chronic fatigue, weakness. Eyes: Negative for vision changes.  ENT: Negative for hoarseness, difficulty swallowing , nasal congestion. CV: Negative for chest pain, angina, palpitations, dyspnea on exertion, peripheral edema.  Respiratory: Negative for dyspnea at rest, dyspnea on exertion, cough, sputum, wheezing.  GI: See history of present illness. GU:  Negative for dysuria, hematuria, urinary incontinence, urinary frequency, nocturnal urination.  MS: Negative for joint pain, low back pain.  Derm: Negative for rash or itching.  Neuro: Negative for weakness, abnormal sensation, seizure, frequent headaches, memory loss, confusion.  Psych: Negative for anxiety, depression, suicidal ideation, hallucinations.  Endo: Negative for unusual weight change.  Heme: Negative for bruising or bleeding. Allergy: Negative for rash or hives.    Physical Examination:  BP 94/55  Pulse 76  Temp(Src) 98.4 F (36.9 C) (Oral)  Ht 6' (1.829 m)  Wt 188 lb 3.2 oz (85.367 kg)  BMI 25.52 kg/m2   General: Well-nourished, well-developed in no acute distress.  Head: Normocephalic, atraumatic.   Eyes:  Conjunctiva pale, no icterus. Mouth: Oropharyngeal mucosa moist and  pink , no lesions erythema or exudate. Neck: Supple without thyromegaly, masses, or lymphadenopathy.  Lungs: Clear to auscultation bilaterally.  Heart: Regular rate and rhythm, no murmurs rubs or gallops.  Abdomen: Bowel sounds are normal, nondistended, no hepatosplenomegaly or masses, no abdominal bruits, no rebound or guarding.  Left inguinal hernia, lemon sized amount of protrusion, reduced. Right inguinal and umbilical hernia reducible and nontender. Rectal: not done. Extremities: No lower extremity edema. No clubbing or deformities.  Neuro: Alert and oriented x 4 , grossly normal neurologically.  Skin: Warm and dry, no rash or jaundice.   Psych: Alert and cooperative, normal mood and affect.  Labs: Lab Results  Component Value Date   WBC 6.0 03/28/2013   HGB 9.9* 03/28/2013   HCT 29.3* 03/28/2013   MCV 93.6 03/28/2013   PLT 97* 03/28/2013   Lab Results  Component Value Date   FERRITIN 244 03/28/2013     Imaging Studies: No results found.

## 2013-04-04 NOTE — Assessment & Plan Note (Addendum)
Bilateral inguinal hernia with recent partial SBO as outlined above. Recommend abdominal/hernia binder. Patient should avoid surgery due to high risk of mortality related to cirrhosis and other comorbidities.   He has "gas pains" without change in bowel function. ?small bowel bacterial overgrowth due to stasis from intermittent partial obstructions and diminshed peristalsis. Discussed with Dr. Gala Romney. Trial of probiotics. If no significant improvement then may consider trial of antibiotics.

## 2013-04-05 NOTE — Progress Notes (Signed)
cc'd to pcp 

## 2013-04-10 ENCOUNTER — Telehealth: Payer: Self-pay | Admitting: Internal Medicine

## 2013-04-10 ENCOUNTER — Encounter: Payer: Self-pay | Admitting: Gastroenterology

## 2013-04-10 NOTE — Telephone Encounter (Signed)
U/S in 6 months

## 2013-04-10 NOTE — Telephone Encounter (Signed)
Letter mailed to the patient

## 2013-05-03 ENCOUNTER — Encounter: Payer: Self-pay | Admitting: Gastroenterology

## 2013-05-03 ENCOUNTER — Ambulatory Visit (INDEPENDENT_AMBULATORY_CARE_PROVIDER_SITE_OTHER): Payer: Medicare Other | Admitting: Gastroenterology

## 2013-05-03 ENCOUNTER — Encounter (INDEPENDENT_AMBULATORY_CARE_PROVIDER_SITE_OTHER): Payer: Self-pay

## 2013-05-03 VITALS — BP 116/55 | HR 61 | Temp 98.4°F | Ht 70.0 in | Wt 193.8 lb

## 2013-05-03 DIAGNOSIS — K7460 Unspecified cirrhosis of liver: Secondary | ICD-10-CM

## 2013-05-03 DIAGNOSIS — R109 Unspecified abdominal pain: Secondary | ICD-10-CM

## 2013-05-03 NOTE — Assessment & Plan Note (Signed)
He is up to date on hepatoma surveillance and labs. Continue lactulose and Xifaxan. Samples provided. He estimates next appt at [facility] around 08/2013. OV here in 07/2013 with Dr. Gala Romney per patient request. He will be due for hepatoma surveillance at that time, can have here locally or at [facility].

## 2013-05-03 NOTE — Assessment & Plan Note (Signed)
Doing much better. Pain has resolved. If recurrent, then restart probiotics at that time.

## 2013-05-03 NOTE — Progress Notes (Signed)
Primary Care Physician: Willie Grills, MD  Primary Gastroenterologist:  Willie Cornea, MD   Chief Complaint  Patient presents with  . Follow-up    HPI: [name] is a 68 y.o. male here for followup. His last seen on 04/04/2013. History of [name] cirrhosis, status post TIPS in 2011 for refractory ascites. Crohn's disease has been in remission. Next colonoscopy due January 2018. January 2015 he had a partial small bowel obstruction related to right inguinal hernia. Transferred [facility], treated conservatively. When I last saw him his complain of intermittent gas pain and abdominal cramping. Having increased problems with his left inguinal hernia rather than the right side as before. Bowel movements 3 per day. No blood in the stool or melena. No heartburn. No peripheral edema. No episodes of HE.  States his gas pains resolved on probiotics. Increased drainage to right ear for past three weeks. Has been present chronically since tubes placed by Dr. Wilburn [name]. Drains at night. Blood clots came out with use of cotton tip. Tried to get seen today by PCP but unable to get appointment. Appetite good. BM 2-3 per day. No melena, brbpr.    Current Outpatient Prescriptions  Medication Sig Dispense Refill  . ALPRAZolam (XANAX) 0.5 MG tablet Take 0.5 mg by mouth daily as needed.       . ferrous sulfate 325 (65 FE) MG tablet Take 325 mg by mouth daily with breakfast.      . furosemide (LASIX) 40 MG tablet Take 20 mg by mouth daily.      . insulin aspart (NOVOLOG) 100 UNIT/ML injection Inject 0-12 Units into the skin 2 (two) times daily. Patient is on Sliding Scale      . insulin glargine (LANTUS) 100 UNIT/ML injection Inject 20 Units into the skin at bedtime.      Marland Kitchen lactulose (CHRONULAC) 10 GM/15ML solution Take 30 g by mouth 2 (two) times daily.      . pantoprazole (PROTONIX) 40 MG tablet Take 1 tablet (40 mg total) by mouth 2 (two) times daily before a meal.  60 tablet  11  .  Probiotic Product (RESTORA) CAPS Take 1 capsule by mouth daily.  30 capsule  0  . propranolol (INDERAL) 20 MG tablet Take 20 mg by mouth 2 (two) times daily.      . rifaximin (XIFAXAN) 550 MG TABS tablet Take 550 mg by mouth.      . simvastatin (ZOCOR) 40 MG tablet 40 mg. Take 40 mg by mouth nightly.       No current facility-administered medications for this visit.    Allergies as of 05/03/2013 - Review Complete 05/03/2013  Allergen Reaction Noted  . Aspirin Other (See Comments) 12/07/2012  . Codeine Other (See Comments)   . Tricode ar [pseudoeph-chlorphen-codeine]  05/27/2012  . Other Rash 05/05/2010  . Tape Rash 12/07/2012    ROS:  General: Negative for anorexia, weight loss, fever, chills, fatigue, weakness. ENT: Negative for hoarseness, difficulty swallowing , nasal congestion.see hpi CV: Negative for chest pain, angina, palpitations, dyspnea on exertion, peripheral edema.  Respiratory: Negative for dyspnea at rest, dyspnea on exertion, cough, sputum, wheezing.  GI: See history of present illness. GU:  Negative for dysuria, hematuria, urinary incontinence, urinary frequency, nocturnal urination.  Endo: Negative for unusual weight change.    Physical Examination:   BP 116/55  Pulse 61  Temp(Src) 98.4 F (36.9 C) (Oral)  Ht 5\' 10"  (1.778 m)  Wt 193 lb 12.8 oz (  87.907 kg)  BMI 27.81 kg/m2  General: chronically ill-appearing in no acute distress.  Eyes: No icterus. Mouth: Oropharyngeal mucosa moist and pink , no lesions erythema or exudate. Lungs: Clear to auscultation bilaterally.  Heart: Regular rate and rhythm, no murmurs rubs or gallops.  Abdomen: Bowel sounds are normal, nontender, nondistended, no hepatosplenomegaly or masses, no abdominal bruits or hernia , no rebound or guarding.   Extremities: No lower extremity edema. No clubbing or deformities. Neuro: Alert and oriented x 4   Skin: Warm and dry, no jaundice.   Psych: Alert and cooperative, normal mood and  affect.  Labs:  Lab Results  Component Value Date   WBC 6.0 03/28/2013   HGB 9.9* 03/28/2013   HCT 29.3* 03/28/2013   MCV 93.6 03/28/2013   PLT 97* 03/28/2013   Lab Results  Component Value Date   FERRITIN 244 03/28/2013    Imaging Studies: No results found.

## 2013-05-03 NOTE — Patient Instructions (Addendum)
1. Call if you have recurrent abdominal pain. 2. Office visit in July with Dr. Gala Romney. 3. Patient was encouraged to call Dr. Wilburn Cornelia about ear concerns.

## 2013-05-08 NOTE — Progress Notes (Signed)
cc'd to pcp 

## 2013-05-24 ENCOUNTER — Telehealth: Payer: Self-pay

## 2013-05-24 NOTE — Telephone Encounter (Signed)
Pt called for samples of Xifaxin. # 12 boxes of 2 ( Xifaxin 550 mg) left at front for pick up.

## 2013-05-29 NOTE — Telephone Encounter (Signed)
noted 

## 2013-06-27 ENCOUNTER — Telehealth: Payer: Self-pay

## 2013-06-27 NOTE — Telephone Encounter (Signed)
Samples of Xifaxin 550 mg # 24 tablets given per pt request, to take one tablet bid.

## 2013-06-27 NOTE — Telephone Encounter (Signed)
Noted  

## 2013-06-28 ENCOUNTER — Encounter (HOSPITAL_BASED_OUTPATIENT_CLINIC_OR_DEPARTMENT_OTHER): Payer: Medicare Other

## 2013-06-28 ENCOUNTER — Encounter (HOSPITAL_COMMUNITY): Payer: Medicare Other | Attending: Hematology and Oncology

## 2013-06-28 ENCOUNTER — Encounter (HOSPITAL_COMMUNITY): Payer: Self-pay

## 2013-06-28 VITALS — BP 112/63 | HR 62 | Temp 98.2°F | Resp 16 | Wt 195.7 lb

## 2013-06-28 DIAGNOSIS — D731 Hypersplenism: Secondary | ICD-10-CM

## 2013-06-28 DIAGNOSIS — Z9889 Other specified postprocedural states: Secondary | ICD-10-CM | POA: Insufficient documentation

## 2013-06-28 DIAGNOSIS — K7460 Unspecified cirrhosis of liver: Secondary | ICD-10-CM

## 2013-06-28 DIAGNOSIS — D649 Anemia, unspecified: Secondary | ICD-10-CM

## 2013-06-28 DIAGNOSIS — K4030 Unilateral inguinal hernia, with obstruction, without gangrene, not specified as recurrent: Secondary | ICD-10-CM | POA: Insufficient documentation

## 2013-06-28 DIAGNOSIS — D509 Iron deficiency anemia, unspecified: Secondary | ICD-10-CM | POA: Insufficient documentation

## 2013-06-28 DIAGNOSIS — E611 Iron deficiency: Secondary | ICD-10-CM

## 2013-06-28 LAB — BASIC METABOLIC PANEL
BUN: 36 mg/dL — AB (ref 6–23)
CHLORIDE: 107 meq/L (ref 96–112)
CO2: 21 meq/L (ref 19–32)
CREATININE: 2.26 mg/dL — AB (ref 0.50–1.35)
Calcium: 8.7 mg/dL (ref 8.4–10.5)
GFR calc non Af Amer: 28 mL/min — ABNORMAL LOW (ref >90)
GFR, EST AFRICAN AMERICAN: 33 mL/min — AB (ref >90)
GLUCOSE: 182 mg/dL — AB (ref 70–99)
POTASSIUM: 5.8 meq/L — AB (ref 3.7–5.3)
Sodium: 139 mEq/L (ref 137–147)

## 2013-06-28 LAB — CBC WITH DIFFERENTIAL/PLATELET
Basophils Absolute: 0 10*3/uL (ref 0.0–0.1)
Basophils Relative: 1 % (ref 0–1)
Eosinophils Absolute: 0.1 10*3/uL (ref 0.0–0.7)
Eosinophils Relative: 3 % (ref 0–5)
HEMATOCRIT: 28.5 % — AB (ref 39.0–52.0)
HEMOGLOBIN: 9.6 g/dL — AB (ref 13.0–17.0)
LYMPHS PCT: 13 % (ref 12–46)
Lymphs Abs: 0.6 10*3/uL — ABNORMAL LOW (ref 0.7–4.0)
MCH: 31.2 pg (ref 26.0–34.0)
MCHC: 33.7 g/dL (ref 30.0–36.0)
MCV: 92.5 fL (ref 78.0–100.0)
MONO ABS: 0.5 10*3/uL (ref 0.1–1.0)
MONOS PCT: 11 % (ref 3–12)
NEUTROS PCT: 72 % (ref 43–77)
Neutro Abs: 3.1 10*3/uL (ref 1.7–7.7)
Platelets: 92 10*3/uL — ABNORMAL LOW (ref 150–400)
RBC: 3.08 MIL/uL — ABNORMAL LOW (ref 4.22–5.81)
RDW: 14.6 % (ref 11.5–15.5)
WBC: 4.3 10*3/uL (ref 4.0–10.5)

## 2013-06-28 LAB — FERRITIN: FERRITIN: 106 ng/mL (ref 22–322)

## 2013-06-28 NOTE — Addendum Note (Signed)
Addended by: Mellissa Kohut on: 06/28/2013 10:27 AM   Modules accepted: Orders

## 2013-06-28 NOTE — Progress Notes (Signed)
LABS DRAWN FOR CBCD, BMP, FERR, EPO.

## 2013-06-28 NOTE — Progress Notes (Signed)
[facility]  OFFICE PROGRESS NOTE  Leonides Grills, MD [place] [id number]  DIAGNOSIS: Anemia - Plan: CBC with Differential, Erythropoietin, digoxin (LANOXIN) 0.125 MG tablet, lisinopril (PRINIVIL,ZESTRIL) 5 MG tablet, mirtazapine (REMERON) 15 MG tablet, ondansetron (ZOFRAN) 4 MG tablet, Zinc Oxide (TRIPLE PASTE) 12.8 % ointment, CBC with Differential, Ferritin, Soluble transferrin receptor, Comprehensive metabolic panel  Cirrhosis of liver without mention of alcohol - Plan: CBC with Differential, Erythropoietin, digoxin (LANOXIN) 0.125 MG tablet, lisinopril (PRINIVIL,ZESTRIL) 5 MG tablet, mirtazapine (REMERON) 15 MG tablet, ondansetron (ZOFRAN) 4 MG tablet, Zinc Oxide (TRIPLE PASTE) 12.8 % ointment, CBC with Differential, Ferritin, Soluble transferrin receptor, Comprehensive metabolic panel  No chief complaint on file.   CURRENT THERAPY: Intermittent iron infusions and/or Procrit for iron deficiency in the setting of NASH-related cirrhosis with hypersplenism.  INTERVAL HISTORY: [name] 68 y.o. male returns for followup of multifactorial anemia with contributions from iron deficiency and hypersplenism do to NASH-related cirrhosis of the liver.  He does experience itchiness of the skin with nasal drip and occasional epistaxis. When he strains at stool, he does see rectal bleeding. Appetite and good with no nausea, vomiting, or excessive lethargy. He does have loose bowel movements as a result of lactulose ingestion. He denies any significant ankle or bowel swelling. He denies any night sweats.  MEDICAL HISTORY: Past Medical History  Diagnosis Date  . Hypertension     negative stress nuclear study in 02/2007; normal coronaries in 2006; Echo in 2006-aortic sclerosis, Mild biatrial enlargement; normal LV  . Pulmonary hypertension     Mild pulmonary hypertension with normal PVR by 2010 cath [facility])  . Hyperlipidemia   .  Syncope 2008    orthostatic hypotension  . Diabetes mellitus     insulin; onset in 1999; retinopathy, neuropathy and nephropathy  . Chronic atrial fibrillation     onset in 1995; anticoagulation discontinued in 2006  . Crohn's disease     -lower GI bleed in 08/2002;  Pentasa stopped by Dr. Marty Heck at [facility] ; clinical remission.  . Obesity   . NASH (nonalcoholic steatohepatitis)     cirrhosis with esophageal varices; h/o GI bleeding. EGD-2010 grade 2 esophageal varices-banded with good hemostasis; portal gastropathy, friable gastric mucosa; hiatal hernia.  EGD- recurrent melena one week later showed 2 eschars at previously placed bands, decompressed esophageal varices grade 1-2,four columns, no bleeding stigmata, normal appearing small bowel, pt has had 1 Hep A/B vaccine.    . Esophageal varices     s/p band ligation multiple times, Portal Hypertensive gastropathy;  . S/P TIPS (transjugular intrahepatic portosystemic shunt)     06/2009-TIPS procedure at [facility]  . GI bleed     07/2009-admitted with recurrent upper GI bleed secondary to esophageal varices-banded x3  . Pleural effusion, left     s/p thoracentesis-multiple  . Depression   . Anemia     Iron def requiring parenteral iron or injections in past, followed by Dr. Everardo All; B12 def  . Duodenal ulcer 1980s  . Ascites   . Hiatal hernia 12/02/2009  . Esophageal erosions     consistent with mild erosive reflux esophagitis  . Portal hypertensive gastropathy   . 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 Gala Romney  . Cancer 10/2011  . Squamous cell carcinoma of face   . Pneumonia     long time ago  . Shortness of breath   . GERD (  gastroesophageal reflux disease)   . Arthritis     knees & ankles   . S/P radiation therapy 01/07/12 thru 03/21/2012    Dr. Thersa Salt; Right cheek/parotid and neck nodes  . Chronic kidney disease     stage III; creatinine of 1.6 in 8/07, 1.35 and 05/2008  . Nephrolithiasis     cystoscopy with stone  extraction in 1990,   . Morbid obesity 09/20/2012  . Coagulopathy 09/20/2012    INTERIM HISTORY: has HYPERLIPIDEMIA; UMBILICAL HERNIA; Cirrhosis of liver without mention of alcohol; Hypertension; Pulmonary hypertension; Diabetes mellitus; Chronic atrial fibrillation; Chronic kidney disease; NASH (nonalcoholic steatohepatitis); Esophageal varices; S/P TIPS (transjugular intrahepatic portosystemic shunt); Anemia; Epigastric pain; Heme positive stool; Edema of left lower extremity; Iron deficiency anemia; B12 deficiency anemia; Upper GI bleed; Melena; Blood loss anemia; Thrombocytopenia; Chronic diarrhea; Abdominal pain; Chronic periodontitis; Carotid bruit; Diabetes; Dehydration; Orthostatic hypotension; Hypotension, unspecified; Acute renal failure; Hypovolemia; Generalized weakness; Morbid obesity; Coagulopathy; Urinary hesitancy; SBO (small bowel obstruction); Inguinal hernia; Small bowel obstruction; Malnutrition of moderate degree; Metabolic acidosis; Hypokalemia; and Atrial fibrillation with rapid ventricular response on his problem list.    ALLERGIES:  is allergic to aspirin; codeine; tricode ar; other; and tape.  MEDICATIONS: has a current medication list which includes the following prescription(s): alprazolam, digoxin, furosemide, insulin aspart, insulin glargine, lactulose, lisinopril, mirtazapine, ondansetron, pantoprazole, restora, propranolol, rifaximin, simvastatin, zinc oxide, and ferrous sulfate.  SURGICAL HISTORY:  Past Surgical History  Procedure Laterality Date  . Tips procedure  09/16/09  . 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 Cholecystectomy    . Circumcision  -1992  . Colonoscopy  -2008    Reportedly normal, done in [place]  . Multiple thoracenteses    . Esophagogastroduodenoscopy  12/02/2009    distal esophageal erosions, small hiatal hernia  . Tips procedure  2011  . Colonoscopy  01/26/2011    RMR:  adequate preparation.  normal rectum. Somewhat  diffusely friable colonic mucosa. A single 4 mm polyp in the ascending segment. couple of mucosal breaks in the distal 10 cm of terminal ileum with  a couple of erosions at the junction  the TI  and colon.. Tubular adenoma, chronic ileitis. Next TCS 01/2016.  Marland Kitchen Esophagogastroduodenoscopy  01/26/2011    RMR: Decompressed/abscent esophagel varices s/p TIPS. small hh. GAVE. Procedure: ESOPHAGOGASTRODUODENOSCOPY (EGD);  Surgeon: Corbin Ade, MD;  Location: [facility];  Service: Endoscopy;  Laterality: N/A;  . Esophagogastroduodenoscopy  04/22/2011    RMR: GAVE.-status post APC ablation as described above  . Esophagogastroduodenoscopy  05/20/2011    RMR: Portal gastropathy.  GAVE - status post APC ablation  . Hot hemostasis  05/20/2011    Procedure: HOT HEMOSTASIS (ARGON PLASMA COAGULATION/BICAP);  Surgeon: Corbin Ade, MD;  Location: [facility];  Service: Endoscopy;  Laterality: N/A;  . Esophagogastroduodenoscopy  07/14/2005    Persisting two columns of grade 2 esophageal varices with one cherry red spot on one of these columns distally.  Otherwise normal  . Skin cancer excision  Oct 2013    large lesion rt side of face  . Cardiac catheterization       04/12/2008 [facility]): no significant CAD, mild pulmonary hypertension with normal PVR, normal pulmonary capillary wedge pressure, normal left ventricular end-diastolic pressure.  . Multiple extractions with alveoloplasty  01/14/2012    Procedure: MULTIPLE EXTRACION WITH ALVEOLOPLASTY;  Surgeon: Charlynne Pander, DDS;  Location: [facility] OR;  Service: Oral  Surgery;  Laterality: N/A;  Extraction of tooth #'s [phone] with alveoloplasty and gross debridement of remaining teeth    FAMILY HISTORY: family history includes COPD in his mother; Diabetes in his mother and sister; Heart attack in his father; Heart disease in his mother; Heart failure in his mother; Hypertension in his mother and sister. There is no  history of Colon cancer, Inflammatory bowel disease, Liver disease, Anesthesia problems, Hypotension, Malignant hyperthermia, or Pseudochol deficiency.  SOCIAL HISTORY:  reports that he has never smoked. He has never used smokeless tobacco. He reports that he does not drink alcohol or use illicit drugs.  REVIEW OF SYSTEMS:  Other than that discussed above is noncontributory.  PHYSICAL EXAMINATION: ECOG PERFORMANCE STATUS: 1 - Symptomatic but completely ambulatory  Blood pressure 112/63, pulse 62, temperature 98.2 F (36.8 C), temperature source Oral, resp. rate 16, weight 195 lb 11.2 oz (88.769 kg).  GENERAL:alert, no distress and comfortable SKIN: skin color, texture, turgor are normal, no rashes or significant lesions EYES: PERLA; Conjunctiva are pink and non-injected, sclera clear SINUSES: No redness or tenderness over maxillary or ethmoid sinuses OROPHARYNX:no exudate, no erythema on lips, buccal mucosa, or tongue. NECK: supple, thyroid normal size, non-tender, without nodularity. No masses CHEST: Bilateral gynecomastia with spider angiomata on the chest wall. LYMPH:  no palpable lymphadenopathy in the cervical, axillary or inguinal LUNGS: clear to auscultation and percussion with normal breathing effort HEART: regular rate & rhythm and no murmurs. ABDOMEN:abdomen soft, non-tender and normal bowel sounds. Spleen tip palpable with no free fluid wave or shifting dullness. MUSCULOSKELETAL:no cyanosis of digits and no clubbing. Range of motion normal.  NEURO: alert & oriented x 3 with fluent speech, no focal motor/sensory deficits. Asterixis is present.   LABORATORY DATA: Lab on 06/28/2013  Component Date Value Ref Range Status  . WBC 06/28/2013 4.3  4.0 - 10.5 K/uL Final  . RBC 06/28/2013 3.08* 4.22 - 5.81 MIL/uL Final  . Hemoglobin 06/28/2013 9.6* 13.0 - 17.0 g/dL Final  . HCT 06/28/2013 28.5* 39.0 - 52.0 % Final  . MCV 06/28/2013 92.5  78.0 - 100.0 fL Final  . MCH 06/28/2013  31.2  26.0 - 34.0 pg Final  . MCHC 06/28/2013 33.7  30.0 - 36.0 g/dL Final  . RDW 06/28/2013 14.6  11.5 - 15.5 % Final  . Platelets 06/28/2013 92* 150 - 400 K/uL Final   Comment: SPECIMEN CHECKED FOR CLOTS                          CONSISTENT WITH PREVIOUS RESULT  . Neutrophils Relative % 06/28/2013 72  43 - 77 % Final  . Neutro Abs 06/28/2013 3.1  1.7 - 7.7 K/uL Final  . Lymphocytes Relative 06/28/2013 13  12 - 46 % Final  . Lymphs Abs 06/28/2013 0.6* 0.7 - 4.0 K/uL Final  . Monocytes Relative 06/28/2013 11  3 - 12 % Final  . Monocytes Absolute 06/28/2013 0.5  0.1 - 1.0 K/uL Final  . Eosinophils Relative 06/28/2013 3  0 - 5 % Final  . Eosinophils Absolute 06/28/2013 0.1  0.0 - 0.7 K/uL Final  . Basophils Relative 06/28/2013 1  0 - 1 % Final  . Basophils Absolute 06/28/2013 0.0  0.0 - 0.1 K/uL Final  . Sodium 06/28/2013 139  137 - 147 mEq/L Final  . Potassium 06/28/2013 5.8* 3.7 - 5.3 mEq/L Final  . Chloride 06/28/2013 107  96 - 112 mEq/L Final  . CO2 06/28/2013 21  19 - 32 mEq/L Final  . Glucose, Bld 06/28/2013 182* 70 - 99 mg/dL Final  . BUN 06/28/2013 36* 6 - 23 mg/dL Final  . Creatinine, Ser 06/28/2013 2.26* 0.50 - 1.35 mg/dL Final  . Calcium 06/28/2013 8.7  8.4 - 10.5 mg/dL Final  . GFR calc non Af Amer 06/28/2013 28* >90 mL/min Final  . GFR calc Af Amer 06/28/2013 33* >90 mL/min Final   Comment: (NOTE)                          The eGFR has been calculated using the CKD EPI equation.                          This calculation has not been validated in all clinical situations.                          eGFR's persistently <90 mL/min signify possible Chronic Kidney                          Disease.    PATHOLOGY: No new pathology.  Urinalysis    Component Value Date/Time   COLORURINE BROWN* 01/08/2013 1120   APPEARANCEUR HAZY* 01/08/2013 1120   LABSPEC >1.030* 01/08/2013 1120   PHURINE 5.0 01/08/2013 1120   GLUCOSEU NEGATIVE 01/08/2013 1120   HGBUR TRACE* 01/08/2013 1120   BILIRUBINUR  SMALL* 01/08/2013 1120   KETONESUR NEGATIVE 01/08/2013 1120   PROTEINUR 100* 01/08/2013 1120   UROBILINOGEN 0.2 01/08/2013 1120   NITRITE NEGATIVE 01/08/2013 1120   LEUKOCYTESUR NEGATIVE 01/08/2013 1120    RADIOGRAPHIC STUDIES: No results found.  ASSESSMENT:  #1. 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 Episode of right inguinal incarcerated hernia with small bowel obstruction, relieved conservatively with replacement    PLAN:  #1. Will replenish  iron stores with IV iron if necessary and if anemic with normal iron stores, Procrit will be administered. #2. Followup in 3 months with CBC, chem profile, ferritin, and Seidel transferrin receptor.   All questions were answered. The patient knows to call the clinic with any problems, questions or concerns. We can certainly see the patient much sooner if necessary.   I spent 25 minutes counseling the patient face to face. The total time spent in the appointment was 30 minutes.    Doroteo Bradford, MD 06/28/2013 10:21 AM  DISCLAIMER:  This note was dictated with voice recognition software.  Similar sounding words can inadvertently be transcribed inaccurately and may not be corrected upon review.

## 2013-06-28 NOTE — Patient Instructions (Signed)
[facility] Discharge Instructions  RECOMMENDATIONS MADE BY THE CONSULTANT AND ANY TEST RESULTS WILL BE SENT TO YOUR REFERRING PHYSICIAN.  EXAM FINDINGS BY THE PHYSICIAN TODAY AND SIGNS OR SYMPTOMS TO REPORT TO CLINIC OR PRIMARY PHYSICIAN: Exam and findings as discussed by Dr. Barnet Glasgow.  Hemoglobin is stable.  Will need to see what your ferritin is and if it's low we will get you scheduled for an iron infusion.  MEDICATIONS PRESCRIBED:  none  INSTRUCTIONS/FOLLOW-UP: Labs and office visit in 3 months.  Thank you for choosing [facility] to provide your oncology and hematology care.  To afford each patient quality time with our providers, please arrive at least 15 minutes before your scheduled appointment time.  With your help, our goal is to use those 15 minutes to complete the necessary work-up to ensure our physicians have the information they need to help with your evaluation and healthcare recommendations.    Effective January 1st, 2014, we ask that you re-schedule your appointment with our physicians should you arrive 10 or more minutes late for your appointment.  We strive to give you quality time with our providers, and arriving late affects you and other patients whose appointments are after yours.    Again, thank you for choosing [facility].  Our hope is that these requests will decrease the amount of time that you wait before being seen by our physicians.       _____________________________________________________________  Should you have questions after your visit to [facility], please contact our office at [phone] between the hours of 8:30 a.m. and 4:30 p.m.  Voicemails left after 4:30 p.m. will not be returned until the following business day.  For prescription refill requests, have your pharmacy contact our office with your prescription refill request.     _______________________________________________________________  We hope that we have given you very good care.  You may receive a patient satisfaction survey in the mail, please complete it and return it as soon as possible.  We value your feedback!

## 2013-07-02 LAB — ERYTHROPOIETIN: Erythropoietin: 18.9 m[IU]/mL — ABNORMAL HIGH (ref 2.6–18.5)

## 2013-08-02 ENCOUNTER — Encounter: Payer: Self-pay | Admitting: Internal Medicine

## 2013-08-08 ENCOUNTER — Other Ambulatory Visit: Payer: Self-pay

## 2013-08-08 ENCOUNTER — Telehealth: Payer: Self-pay

## 2013-08-08 NOTE — Telephone Encounter (Signed)
Pt is calling because the pharmacy stated that the dose was changed from QID to BID. He would also like some samples of the Xifaxin

## 2013-08-08 NOTE — Telephone Encounter (Signed)
I called [facility], Dr.McGough is who refilled the lactulose for once a day. I spoke with the pt. Pt is currently taking it anywhere from 2-4 times a day. He titrates the dose to how many bm's he has daily. Pt only has enough to last him one day.   He needs a new rx for lactulose sent in. Routing to refill box  Gave him #3 boxes of xifaxan samples.

## 2013-08-09 MED ORDER — LACTULOSE 10 GM/15ML PO SOLN: 30.0000 g | Freq: Four times a day (QID) | ORAL | Status: DC

## 2013-08-09 NOTE — Telephone Encounter (Signed)
Completed.

## 2013-08-28 ENCOUNTER — Ambulatory Visit (INDEPENDENT_AMBULATORY_CARE_PROVIDER_SITE_OTHER): Payer: Medicare Other | Admitting: Internal Medicine

## 2013-08-28 ENCOUNTER — Encounter (INDEPENDENT_AMBULATORY_CARE_PROVIDER_SITE_OTHER): Payer: Self-pay

## 2013-08-28 ENCOUNTER — Encounter: Payer: Self-pay | Admitting: Internal Medicine

## 2013-08-28 VITALS — BP 134/65 | HR 50 | Temp 97.5°F | Ht 70.0 in | Wt 197.6 lb

## 2013-08-28 DIAGNOSIS — K7682 Hepatic encephalopathy: Secondary | ICD-10-CM

## 2013-08-28 DIAGNOSIS — K7290 Hepatic failure, unspecified without coma: Secondary | ICD-10-CM

## 2013-08-28 DIAGNOSIS — K7460 Unspecified cirrhosis of liver: Secondary | ICD-10-CM

## 2013-08-28 MED ORDER — LACTULOSE 10 GM/15ML PO SOLN: ORAL | Status: DC

## 2013-08-28 NOTE — Patient Instructions (Addendum)
Continue present medical regimen  We will try and get you some free Xifaxin  Continue lactulose daily to facilitate 3 soft BM's daily  CBC, Bmet today  Will retrieve D/C summary from [place] visit in 3 months

## 2013-08-28 NOTE — Progress Notes (Signed)
Primary Care Physician:  Leonides Grills, MD Primary Gastroenterologist:  Dr. Gala Romney  Pre-Procedure History & Physical: HPI:  [name] is a 68 y.o. male here for a followup your complicated history of NASH/cirrhosis with a recurrent GI bleeding requiring a TIPS now with recurrent encephalopathy, chronic kidney disease and recent hyperkalemia. He was last seen at [facility] back in June of this year. That time he had a potassium of 6.1 he was admitted to [facility] earlier this month after a fall;  he was found have acute hyperkalemia with a potassium of 6.9, creatinine 2.49, Peaked T waves on his EKG. He was given glucose, insulin calcium and Kayexalate. According to the discharge summary, potassium came down to 4.2.   His lisinopril was stopped during the hospitalization. He was hospitalized for about 2 days up in [place]. He has had trouble acquiring Xifaxan; he is taking lactulose -  titrated to 3 soft bowel movements daily. Family members who accompany him today state encephalopathy is being fairly well managed. [facility] was not successful in getting him any help with Xifaxan. We don't have any samples today. He tells me out of pocket expense  runs about $5-$600 a month. He cannot afford it. Head and neck cancer doing well-followed by physicians in [facility].    Past Medical History  Diagnosis Date  . Hypertension     negative stress nuclear study in 02/2007; normal coronaries in 2006; Echo in 2006-aortic sclerosis, Mild biatrial enlargement; normal LV  . Pulmonary hypertension     Mild pulmonary hypertension with normal PVR by 2010 cath [facility])  . Hyperlipidemia   . Syncope 2008    orthostatic hypotension  . Diabetes mellitus     insulin; onset in 1999; retinopathy, neuropathy and nephropathy  . Chronic atrial fibrillation     onset in 1995; anticoagulation discontinued in 2006  . Crohn's disease     -lower GI bleed in 08/2002;  Pentasa stopped by Dr. Marty Heck  at [facility] ; clinical remission.  . Obesity   . NASH (nonalcoholic steatohepatitis)     cirrhosis with esophageal varices; h/o GI bleeding. EGD-2010 grade 2 esophageal varices-banded with good hemostasis; portal gastropathy, friable gastric mucosa; hiatal hernia.  EGD- recurrent melena one week later showed 2 eschars at previously placed bands, decompressed esophageal varices grade 1-2,four columns, no bleeding stigmata, normal appearing small bowel, pt has had 1 Hep A/B vaccine.    . Esophageal varices     s/p band ligation multiple times, Portal Hypertensive gastropathy;  . S/P TIPS (transjugular intrahepatic portosystemic shunt)     06/2009-TIPS procedure at [facility]  . GI bleed     07/2009-admitted with recurrent upper GI bleed secondary to esophageal varices-banded x3  . Pleural effusion, left     s/p thoracentesis-multiple  . Depression   . Anemia     Iron def requiring parenteral iron or injections in past, followed by Dr. Everardo All; B12 def  . Duodenal ulcer 1980s  . Ascites   . Hiatal hernia 12/02/2009  . Esophageal erosions     consistent with mild erosive reflux esophagitis  . Portal hypertensive gastropathy   . 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 Gala Romney  . Cancer 10/2011  . Squamous cell carcinoma of face   . Pneumonia     long time ago  . Shortness of breath   . GERD (gastroesophageal reflux disease)   . Arthritis     knees &  ankles   . S/P radiation therapy 01/07/12 thru 03/21/2012    Dr. Orlene Erm; Right cheek/parotid and neck nodes  . Chronic kidney disease     stage III; creatinine of 1.6 in 8/07, 1.35 and 05/2008  . Nephrolithiasis     cystoscopy with stone extraction in 1990,   . Morbid obesity 09/20/2012  . Coagulopathy 09/20/2012  . SBO (small bowel obstruction)     Past Surgical History  Procedure Laterality Date  . Tips procedure  09/16/09  . 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  Cholecystectomy    . Circumcision  -1992  . Colonoscopy  -2008    Reportedly normal, done in [place]  . Multiple thoracenteses    . Esophagogastroduodenoscopy  12/02/2009    distal esophageal erosions, small hiatal hernia  . Tips procedure  2011  . Colonoscopy  01/26/2011    RMR: adequate preparation.  normal rectum. Somewhat  diffusely friable colonic mucosa. A single 4 mm polyp in the ascending segment. couple of mucosal breaks in the distal 10 cm of terminal ileum with  a couple of erosions at the junction  the TI  and colon.. Tubular adenoma, chronic ileitis. Next TCS 01/2016.  Marland Kitchen Esophagogastroduodenoscopy  01/26/2011    RMR: Decompressed/abscent esophagel varices s/p TIPS. small hh. GAVE. Procedure: ESOPHAGOGASTRODUODENOSCOPY (EGD);  Surgeon: Daneil Dolin, MD;  [facility];  Service: Endoscopy;  Laterality: N/A;  . Esophagogastroduodenoscopy  04/22/2011    RMR: GAVE.-status post APC ablation as described above  . Esophagogastroduodenoscopy  05/20/2011    RMR: Portal gastropathy.  GAVE - status post APC ablation  . Hot hemostasis  05/20/2011    Procedure: HOT HEMOSTASIS (ARGON PLASMA COAGULATION/BICAP);  Surgeon: Daneil Dolin, MD;  Location: [facility];  Service: Endoscopy;  Laterality: N/A;  . Esophagogastroduodenoscopy  07/14/2005    Persisting two columns of grade 2 esophageal varices with one cherry red spot on one of these columns distally.  Otherwise normal  . Skin cancer excision  Oct 2013    large lesion rt side of face  . Cardiac catheterization       04/12/2008 [facility]): no significant CAD, mild pulmonary hypertension with normal PVR, normal pulmonary capillary wedge pressure, normal left ventricular end-diastolic pressure.  . Multiple extractions with alveoloplasty  01/14/2012    Procedure: MULTIPLE EXTRACION WITH ALVEOLOPLASTY;  Surgeon: Lenn Cal, DDS;  Location: [facility];  Service: Oral Surgery;  Laterality: N/A;  Extraction of tooth #'s [phone] with  alveoloplasty and gross debridement of remaining teeth    Prior to Admission medications   Medication Sig Start Date End Date Taking? Authorizing Provider  ALPRAZolam Duanne Moron) 0.5 MG tablet Take 0.5 mg by mouth daily as needed.  02/08/11  Yes Historical Provider, MD  ciprofloxacin-dexamethasone (CIPRODEX) otic suspension Place 4 drops into the left ear at bedtime.   Yes Historical Provider, MD  digoxin (LANOXIN) 0.125 MG tablet Take 125 mcg by mouth. Takes on Mondays, Wednesdays and Fridays if heart rate above 60 12/09/10  Yes Historical Provider, MD  furosemide (LASIX) 40 MG tablet Take 20 mg by mouth daily.   Yes Historical Provider, MD  insulin aspart (NOVOLOG) 100 UNIT/ML injection Inject 0-12 Units into the skin 2 (two) times daily. Patient is on Sliding Scale   Yes Historical Provider, MD  insulin glargine (LANTUS) 100 UNIT/ML injection Inject 20 Units into the skin at bedtime.   Yes Historical  Provider, MD  lactulose (CHRONULAC) 10 GM/15ML solution Take 45 mLs (30 g total) by mouth 4 (four) times daily.   Yes Orvil Feil, NP  lisinopril (PRINIVIL,ZESTRIL) 5 MG tablet Take 5 mg by mouth daily.    Yes Historical Provider, MD  ondansetron (ZOFRAN) 4 MG tablet Take 4 mg by mouth every 8 (eight) hours as needed.    Yes Historical Provider, MD  pantoprazole (PROTONIX) 40 MG tablet Take 1 tablet (40 mg total) by mouth 2 (two) times daily before a meal. 04/04/13  Yes Mahala Menghini, PA-C  propranolol (INDERAL) 20 MG tablet Take 20 mg by mouth 2 (two) times daily.   Yes Historical Provider, MD  rifaximin (XIFAXAN) 550 MG TABS tablet Take 550 mg by mouth 2 (two) times daily.    Yes Historical Provider, MD  simvastatin (ZOCOR) 40 MG tablet 40 mg. Take 40 mg by mouth nightly. 12/09/10  Yes Historical Provider, MD  Zinc Oxide (TRIPLE PASTE) 12.8 % ointment Apply 1 application topically Three (3) times a day. 01/30/13  Yes Historical Provider, MD  lactulose [facility]) 10 GM/15ML solution Take as directed  08/28/13   Daneil Dolin, MD    Allergies as of 08/28/2013 - Review Complete 08/28/2013  Allergen Reaction Noted  . Aspirin Other (See Comments) 12/07/2012  . Codeine Other (See Comments)   . Tricode ar [pseudoeph-chlorphen-codeine]  05/27/2012  . Other Rash 05/05/2010  . Tape Rash 12/07/2012    Family History  Problem Relation Age of Onset  . Colon cancer Neg Hx   . Inflammatory bowel disease Neg Hx   . Liver disease Neg Hx   . Anesthesia problems Neg Hx   . Hypotension Neg Hx   . Malignant hyperthermia Neg Hx   . Pseudochol deficiency Neg Hx   . Heart attack Father   . COPD Mother   . Diabetes Mother   . Hypertension Mother   . Heart failure Mother   . Heart disease Mother   . Hypertension Sister   . Diabetes Sister     History   Social History  . Marital Status: Divorced    Spouse Name: N/A    Number of Children: 1  . Years of Education: N/A   Occupational History  . retired     disabled   Social History Main Topics  . Smoking status: Never Smoker   . Smokeless tobacco: Never Used  . Alcohol Use: No  . Drug Use: No  . Sexual Activity: Yes    Birth Control/ Protection: None   Other Topics Concern  . Not on file   Social History Narrative   The patient is divorced. The patient had one son.   Patient denies ever having smoked or used smokeless tobacco products. Patient denies current use of alcohol.      Mother and father are both deceased.    Review of Systems: See HPI, otherwise negative ROS  Physical Exam: BP 134/65  Pulse 50  Temp(Src) 97.5 F (36.4 C) (Oral)  Ht 5\' 10"  (1.778 m)  Wt 197 lb 9.6 oz (89.631 kg)  BMI 28.35 kg/m2 General:   Frail, gaunt-appearing , chronically ill-appearing gentleman in no acute distress. He is pleasant and cooperative. He is accompanied by family members. Skin:  no jaundice. Eyes:  Sclera clear, no icterus.   Conjunctiva pink. Ears:  Normal auditory acuity. Nose:  No deformity, discharge,  or  lesions. Mouth:  No deformity or lesions. Neck:  Supple; no masses or thyromegaly.  No significant cervical adenopathy. Lungs:  Clear throughout to auscultation.   No wheezes, crackles, or rhonchi. No acute distress. Heart:  Regular rate and rhythm; no murmurs, clicks, rubs,  or gallops. Abdomen: Umbilical hernia partially reducible. Positive bowel sounds. No obvious ascites. I do not appreciate a mass or any hepatosplenomegaly today Pulses:  Normal pulses noted. Extremities:  Without clubbing or edema.  Impression:  Complicated gentleman with Nash/cirrhosis, status post TIPS, chronic kidney disease, recurrent encephalopathy, recent hyperkalemia felt to be secondary to ACE inhibition therapy possibly rhabdomyolysis.  Relatively stable at this time. He has significant hyperkalemia just 2 weeks ago. Labs have not been rechecked. He cannot afford Xifaxan. Recent Doppler / ultrasound showed no evidence of hepatoma and a patent TIPS at [facility].  Head and neck cancer in remission.  Inguinal and umbilical hernias-not a surgical candidate   Recommendations: Continue present medical regimen  We will try and get you some free Xifaxin  Continue lactulose daily to facilitate 3 soft BM's daily  CBC, BMET today  Will retrieve D/C summary from [facility]  Office visit in 3 months      Notice: This dictation was prepared with Dragon dictation along with smaller phrase technology. Any transcriptional errors that result from this process are unintentional and may not be corrected upon review.

## 2013-08-29 LAB — BASIC METABOLIC PANEL
BUN: 34 mg/dL — ABNORMAL HIGH (ref 6–23)
CO2: 23 meq/L (ref 19–32)
Calcium: 8.5 mg/dL (ref 8.4–10.5)
Chloride: 108 mEq/L (ref 96–112)
Creat: 2.21 mg/dL — ABNORMAL HIGH (ref 0.50–1.35)
Glucose, Bld: 151 mg/dL — ABNORMAL HIGH (ref 70–99)
POTASSIUM: 5.4 meq/L — AB (ref 3.5–5.3)
SODIUM: 138 meq/L (ref 135–145)

## 2013-08-29 LAB — CBC WITH DIFFERENTIAL/PLATELET
Basophils Absolute: 0.1 10*3/uL (ref 0.0–0.1)
Basophils Relative: 1 % (ref 0–1)
Eosinophils Absolute: 0.2 10*3/uL (ref 0.0–0.7)
Eosinophils Relative: 3 % (ref 0–5)
HCT: 27.3 % — ABNORMAL LOW (ref 39.0–52.0)
Hemoglobin: 9.3 g/dL — ABNORMAL LOW (ref 13.0–17.0)
LYMPHS ABS: 0.6 10*3/uL — AB (ref 0.7–4.0)
LYMPHS PCT: 11 % — AB (ref 12–46)
MCH: 31 pg (ref 26.0–34.0)
MCHC: 34.1 g/dL (ref 30.0–36.0)
MCV: 91 fL (ref 78.0–100.0)
Monocytes Absolute: 0.7 10*3/uL (ref 0.1–1.0)
Monocytes Relative: 13 % — ABNORMAL HIGH (ref 3–12)
NEUTROS ABS: 3.7 10*3/uL (ref 1.7–7.7)
Neutrophils Relative %: 72 % (ref 43–77)
PLATELETS: 98 10*3/uL — AB (ref 150–400)
RBC: 3 MIL/uL — AB (ref 4.22–5.81)
RDW: 15.4 % (ref 11.5–15.5)
WBC: 5.2 10*3/uL (ref 4.0–10.5)

## 2013-09-03 ENCOUNTER — Telehealth: Payer: Self-pay

## 2013-09-03 NOTE — Telephone Encounter (Signed)
RMR called and wanted me to call pt. His potassium is slightly elevated and he needs to follow up with his urologist. Pt stated he talked to Dr. Orson Ape about this on Friday and he told him to avoid high potassium foods. He used to see a urologist in [place] but its been so long he doesn't remember his name and he doesn't want to see Dr.Befekadu in [place]. I asked him if it was ok to send him to see [facility] when they come to [facility]. Pt stated that it was fine to go ahead with the referral to [facility]. Pt also said he is now taking lasix 40mg  a day.

## 2013-09-03 NOTE — Telephone Encounter (Signed)
Please not he needs a NEPHROLOGIST, not a UROLOGIST

## 2013-09-03 NOTE — Telephone Encounter (Signed)
[name], please refer to nephrologist.

## 2013-09-04 ENCOUNTER — Other Ambulatory Visit: Payer: Self-pay | Admitting: Internal Medicine

## 2013-09-05 NOTE — Telephone Encounter (Signed)
I have faxed over referral to [facility], they will look over [name] records and then call him for an appointment

## 2013-09-20 NOTE — Telephone Encounter (Signed)
Pt has an appointment on November 11 @ 9:30 with Dr.Deun-Ciocca. Pt is aware of appointment

## 2013-09-28 ENCOUNTER — Encounter (HOSPITAL_COMMUNITY): Payer: Medicare Other | Attending: Hematology and Oncology

## 2013-09-28 ENCOUNTER — Ambulatory Visit (HOSPITAL_COMMUNITY): Payer: Self-pay | Admitting: Oncology

## 2013-09-28 ENCOUNTER — Encounter (HOSPITAL_COMMUNITY): Payer: Self-pay

## 2013-09-28 ENCOUNTER — Encounter (HOSPITAL_BASED_OUTPATIENT_CLINIC_OR_DEPARTMENT_OTHER): Payer: Medicare Other

## 2013-09-28 VITALS — BP 110/48 | HR 62 | Temp 98.3°F | Resp 18 | Wt 199.1 lb

## 2013-09-28 DIAGNOSIS — N189 Chronic kidney disease, unspecified: Secondary | ICD-10-CM

## 2013-09-28 DIAGNOSIS — D509 Iron deficiency anemia, unspecified: Secondary | ICD-10-CM | POA: Diagnosis not present

## 2013-09-28 DIAGNOSIS — E611 Iron deficiency: Secondary | ICD-10-CM

## 2013-09-28 DIAGNOSIS — E875 Hyperkalemia: Secondary | ICD-10-CM

## 2013-09-28 DIAGNOSIS — D731 Hypersplenism: Secondary | ICD-10-CM | POA: Insufficient documentation

## 2013-09-28 DIAGNOSIS — K7460 Unspecified cirrhosis of liver: Secondary | ICD-10-CM

## 2013-09-28 DIAGNOSIS — D649 Anemia, unspecified: Secondary | ICD-10-CM

## 2013-09-28 DIAGNOSIS — I10 Essential (primary) hypertension: Secondary | ICD-10-CM | POA: Insufficient documentation

## 2013-09-28 DIAGNOSIS — Z95828 Presence of other vascular implants and grafts: Secondary | ICD-10-CM

## 2013-09-28 DIAGNOSIS — E785 Hyperlipidemia, unspecified: Secondary | ICD-10-CM | POA: Diagnosis not present

## 2013-09-28 LAB — COMPREHENSIVE METABOLIC PANEL
ALT: 8 U/L (ref 0–53)
AST: 15 U/L (ref 0–37)
Albumin: 2.6 g/dL — ABNORMAL LOW (ref 3.5–5.2)
Alkaline Phosphatase: 122 U/L — ABNORMAL HIGH (ref 39–117)
Anion gap: 9 (ref 5–15)
BILIRUBIN TOTAL: 1.4 mg/dL — AB (ref 0.3–1.2)
BUN: 30 mg/dL — ABNORMAL HIGH (ref 6–23)
CALCIUM: 8.5 mg/dL (ref 8.4–10.5)
CO2: 24 meq/L (ref 19–32)
CREATININE: 2.1 mg/dL — AB (ref 0.50–1.35)
Chloride: 107 mEq/L (ref 96–112)
GFR calc Af Amer: 36 mL/min — ABNORMAL LOW (ref >90)
GFR, EST NON AFRICAN AMERICAN: 31 mL/min — AB (ref >90)
GLUCOSE: 124 mg/dL — AB (ref 70–99)
Potassium: 4.6 mEq/L (ref 3.7–5.3)
SODIUM: 140 meq/L (ref 137–147)
Total Protein: 7.2 g/dL (ref 6.0–8.3)

## 2013-09-28 LAB — CBC WITH DIFFERENTIAL/PLATELET
BASOS PCT: 0 % (ref 0–1)
Basophils Absolute: 0 10*3/uL (ref 0.0–0.1)
EOS ABS: 0.2 10*3/uL (ref 0.0–0.7)
Eosinophils Relative: 3 % (ref 0–5)
HEMATOCRIT: 27.7 % — AB (ref 39.0–52.0)
HEMOGLOBIN: 9.4 g/dL — AB (ref 13.0–17.0)
LYMPHS ABS: 0.7 10*3/uL (ref 0.7–4.0)
Lymphocytes Relative: 12 % (ref 12–46)
MCH: 31.4 pg (ref 26.0–34.0)
MCHC: 33.9 g/dL (ref 30.0–36.0)
MCV: 92.6 fL (ref 78.0–100.0)
MONO ABS: 0.7 10*3/uL (ref 0.1–1.0)
Monocytes Relative: 13 % — ABNORMAL HIGH (ref 3–12)
Neutro Abs: 4.3 10*3/uL (ref 1.7–7.7)
Neutrophils Relative %: 73 % (ref 43–77)
Platelets: 90 10*3/uL — ABNORMAL LOW (ref 150–400)
RBC: 2.99 MIL/uL — ABNORMAL LOW (ref 4.22–5.81)
RDW: 14.6 % (ref 11.5–15.5)
WBC: 5.8 10*3/uL (ref 4.0–10.5)

## 2013-09-28 LAB — FERRITIN: Ferritin: 93 ng/mL (ref 22–322)

## 2013-09-28 NOTE — Progress Notes (Signed)
LABS FOR SOTRAN,FERR,CBCD,CMP

## 2013-09-28 NOTE — Addendum Note (Signed)
Addended by: Kurtis Bushman A on: 09/28/2013 01:02 PM   Modules accepted: Orders

## 2013-09-28 NOTE — Progress Notes (Signed)
[facility]  OFFICE PROGRESS NOTE  Leonides Grills, MD [place] [address]  DIAGNOSIS: Cirrhosis of liver without mention of alcohol  Hypersplenism  Iron deficiency  Chief Complaint  Patient presents with  . Liver cirrhosis  . Hypersplenism  . Iron deficiency    CURRENT THERAPY: Intermittent iron infusion and or Procrit for iron deficiency in the setting of NASH-related cirrhosis with hypersplenism, last iron infusion given on 07/05/2011.  INTERVAL HISTORY: [name] 68 y.o. male returns for multifactorial anemia with contributions from iron deficiency and hypersplenism do to NASH-related cirrhosis of the liver.  He continues to do well with no episodes of epistaxis, melena, hematochezia, or hematuria. He does have 4-6 bowel movements per day due to lactulose. He is sleeping well. Denies any lower extremity swelling that is persistent or abdominal distention. He denies any cough, wheezing, PND, orthopnea, palpitations, with rare episodes of epistaxis.    MEDICAL HISTORY: Past Medical History  Diagnosis Date  . Hypertension     negative stress nuclear study in 02/2007; normal coronaries in 2006; Echo in 2006-aortic sclerosis, Mild biatrial enlargement; normal LV  . Pulmonary hypertension     Mild pulmonary hypertension with normal PVR by 2010 cath [facility])  . Hyperlipidemia   . Syncope 2008    orthostatic hypotension  . Diabetes mellitus     insulin; onset in 1999; retinopathy, neuropathy and nephropathy  . Chronic atrial fibrillation     onset in 1995; anticoagulation discontinued in 2006  . Crohn's disease     -lower GI bleed in 08/2002;  Pentasa stopped by Dr. Marty Heck at [facility] ; clinical remission.  . Obesity   . NASH (nonalcoholic steatohepatitis)     cirrhosis with esophageal varices; h/o GI bleeding. EGD-2010 grade 2 esophageal varices-banded with good hemostasis; portal gastropathy, friable  gastric mucosa; hiatal hernia.  EGD- recurrent melena one week later showed 2 eschars at previously placed bands, decompressed esophageal varices grade 1-2,four columns, no bleeding stigmata, normal appearing small bowel, pt has had 1 Hep A/B vaccine.    . Esophageal varices     s/p band ligation multiple times, Portal Hypertensive gastropathy;  . S/P TIPS (transjugular intrahepatic portosystemic shunt)     06/2009-TIPS procedure at [facility]  . GI bleed     07/2009-admitted with recurrent upper GI bleed secondary to esophageal varices-banded x3  . Pleural effusion, left     s/p thoracentesis-multiple  . Depression   . Anemia     Iron def requiring parenteral iron or injections in past, followed by Dr. Everardo All; B12 def  . Duodenal ulcer 1980s  . Ascites   . Hiatal hernia 12/02/2009  . Esophageal erosions     consistent with mild erosive reflux esophagitis  . Portal hypertensive gastropathy   . 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 Gala Romney  . Cancer 10/2011  . Squamous cell carcinoma of face   . Pneumonia     long time ago  . Shortness of breath   . GERD (gastroesophageal reflux disease)   . Arthritis     knees & ankles   . S/P radiation therapy 01/07/12 thru 03/21/2012    Dr. Orlene Erm; Right cheek/parotid and neck nodes  . Chronic kidney disease     stage III; creatinine of 1.6 in 8/07, 1.35 and 05/2008  . Nephrolithiasis     cystoscopy with stone extraction in 1990,   .  Morbid obesity 09/20/2012  . Coagulopathy 09/20/2012  . SBO (small bowel obstruction)     INTERIM HISTORY: has HYPERLIPIDEMIA; UMBILICAL HERNIA; Cirrhosis of liver without mention of alcohol; Hypertension; Pulmonary hypertension; Diabetes mellitus; Chronic atrial fibrillation; Chronic kidney disease; NASH (nonalcoholic steatohepatitis); Esophageal varices; S/P TIPS (transjugular intrahepatic portosystemic shunt); Anemia; Epigastric pain; Heme positive stool; Edema of left lower extremity; Iron deficiency  anemia; B12 deficiency anemia; Upper GI bleed; Melena; Blood loss anemia; Thrombocytopenia; Chronic diarrhea; Abdominal pain; Chronic periodontitis; Carotid bruit; Diabetes; Dehydration; Orthostatic hypotension; Hypotension, unspecified; Acute renal failure; Hypovolemia; Generalized weakness; Morbid obesity; Coagulopathy; Urinary hesitancy; SBO (small bowel obstruction); Inguinal hernia; Small bowel obstruction; Malnutrition of moderate degree; Metabolic acidosis; Hypokalemia; and Atrial fibrillation with rapid ventricular response on his problem list.    ALLERGIES:  is allergic to aspirin; codeine; tricode ar; other; and tape.  MEDICATIONS: has a current medication list which includes the following prescription(s): alprazolam, ciprofloxacin-dexamethasone, digoxin, furosemide, insulin aspart, insulin glargine, lactulose, lactulose, lisinopril, ondansetron, pantoprazole, propranolol, rifaximin, simvastatin, and zinc oxide.  SURGICAL HISTORY:  Past Surgical History  Procedure Laterality Date  . Tips procedure  09/16/09  . 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 Cholecystectomy    . Circumcision  -1992  . Colonoscopy  -2008    Reportedly normal, done in [place]  . Multiple thoracenteses    . Esophagogastroduodenoscopy  12/02/2009    distal esophageal erosions, small hiatal hernia  . Tips procedure  2011  . Colonoscopy  01/26/2011    RMR: adequate preparation.  normal rectum. Somewhat  diffusely friable colonic mucosa. A single 4 mm polyp in the ascending segment. couple of mucosal breaks in the distal 10 cm of terminal ileum with  a couple of erosions at the junction  the TI  and colon.. Tubular adenoma, chronic ileitis. Next TCS 01/2016.  Marland Kitchen Esophagogastroduodenoscopy  01/26/2011    RMR: Decompressed/abscent esophagel varices s/p TIPS. small hh. GAVE. Procedure: ESOPHAGOGASTRODUODENOSCOPY (EGD);  Surgeon: Daneil Dolin, MD;   [facility];  Service: Endoscopy;  Laterality: N/A;  . Esophagogastroduodenoscopy  04/22/2011    RMR: GAVE.-status post APC ablation as described above  . Esophagogastroduodenoscopy  05/20/2011    RMR: Portal gastropathy.  GAVE - status post APC ablation  . Hot hemostasis  05/20/2011    Procedure: HOT HEMOSTASIS (ARGON PLASMA COAGULATION/BICAP);  Surgeon: Daneil Dolin, MD;  Location: [facility];  Service: Endoscopy;  Laterality: N/A;  . Esophagogastroduodenoscopy  07/14/2005    Persisting two columns of grade 2 esophageal varices with one cherry red spot on one of these columns distally.  Otherwise normal  . Skin cancer excision  Oct 2013    large lesion rt side of face  . Cardiac catheterization       04/12/2008 [facility]): no significant CAD, mild pulmonary hypertension with normal PVR, normal pulmonary capillary wedge pressure, normal left ventricular end-diastolic pressure.  . Multiple extractions with alveoloplasty  01/14/2012    Procedure: MULTIPLE EXTRACION WITH ALVEOLOPLASTY;  Surgeon: Lenn Cal, DDS;  Location: [facility];  Service: Oral Surgery;  Laterality: N/A;  Extraction of tooth #'s [phone] with alveoloplasty and gross debridement of remaining teeth    FAMILY HISTORY: family history includes COPD in his mother; Diabetes in his mother and sister; Heart attack in his father; Heart disease in his mother; Heart failure in his mother; Hypertension in his mother and sister. There is no history of Colon  cancer, Inflammatory bowel disease, Liver disease, Anesthesia problems, Hypotension, Malignant hyperthermia, or Pseudochol deficiency.  SOCIAL HISTORY:  reports that he has never smoked. He has never used smokeless tobacco. He reports that he does not drink alcohol or use illicit drugs.  REVIEW OF SYSTEMS:  Other than that discussed above is noncontributory.  PHYSICAL EXAMINATION: ECOG PERFORMANCE STATUS: 1 - Symptomatic but completely ambulatory  There were no vitals  taken for this visit.  GENERAL:alert, no distress and comfortable SKIN: skin color, texture, turgor are normal, no rashes or significant lesions EYES: PERLA; Conjunctiva are pink and non-injected, sclera clear SINUSES: No redness or tenderness over maxillary or ethmoid sinuses OROPHARYNX:no exudate, no erythema on lips, buccal mucosa, or tongue. NECK: supple, thyroid normal size, non-tender, without nodularity. No masses CHEST: Bilateral gynecomastia with increased AP diameter. Spider angiomata are noted. LYMPH:  no palpable lymphadenopathy in the cervical, axillary or inguinal LUNGS: clear to auscultation and percussion with normal breathing effort HEART: regular rate & rhythm and no murmurs. ABDOMEN:abdomen soft, non-tender and normal bowel sounds. Spleen tip palpable with no free fluid wave or shifting dullness. Liver 9 x 3 cm. MUSCULOSKELETAL:no cyanosis of digits and no clubbing. Range of motion normal.  NEURO: alert & oriented x 3 with fluent speech, no focal motor/sensory deficits. No evidence of asterixis.   LABORATORY DATA: No visits with results within 30 Day(s) from this visit. Latest known visit with results is:  Office Visit on 08/28/2013  Component Date Value Ref Range Status  . WBC 08/28/2013 5.2  4.0 - 10.5 K/uL Final  . RBC 08/28/2013 3.00* 4.22 - 5.81 MIL/uL Final  . Hemoglobin 08/28/2013 9.3* 13.0 - 17.0 g/dL Final  . HCT 08/28/2013 27.3* 39.0 - 52.0 % Final  . MCV 08/28/2013 91.0  78.0 - 100.0 fL Final  . MCH 08/28/2013 31.0  26.0 - 34.0 pg Final  . MCHC 08/28/2013 34.1  30.0 - 36.0 g/dL Final  . RDW 08/28/2013 15.4  11.5 - 15.5 % Final  . Platelets 08/28/2013 98* 150 - 400 K/uL Final  . Neutrophils Relative % 08/28/2013 72  43 - 77 % Final  . Neutro Abs 08/28/2013 3.7  1.7 - 7.7 K/uL Final  . Lymphocytes Relative 08/28/2013 11* 12 - 46 % Final  . Lymphs Abs 08/28/2013 0.6* 0.7 - 4.0 K/uL Final  . Monocytes Relative 08/28/2013 13* 3 - 12 % Final  . Monocytes  Absolute 08/28/2013 0.7  0.1 - 1.0 K/uL Final  . Eosinophils Relative 08/28/2013 3  0 - 5 % Final  . Eosinophils Absolute 08/28/2013 0.2  0.0 - 0.7 K/uL Final  . Basophils Relative 08/28/2013 1  0 - 1 % Final  . Basophils Absolute 08/28/2013 0.1  0.0 - 0.1 K/uL Final  . Smear Review 08/28/2013 SEE NOTE   Final   Comment:                            RBC, Platelet and WBC Morphology unremarkable  . Sodium 08/28/2013 138  135 - 145 mEq/L Final  . Potassium 08/28/2013 5.4* 3.5 - 5.3 mEq/L Final   No visible hemolysis.  . Chloride 08/28/2013 108  96 - 112 mEq/L Final  . CO2 08/28/2013 23  19 - 32 mEq/L Final  . Glucose, Bld 08/28/2013 151* 70 - 99 mg/dL Final  . BUN 08/28/2013 34* 6 - 23 mg/dL Final  . Creat 08/28/2013 2.21* 0.50 - 1.35 mg/dL Final  . Calcium 08/28/2013  8.5  8.4 - 10.5 mg/dL Final    PATHOLOGY: No new pathology.  Urinalysis    Component Value Date/Time   COLORURINE BROWN* 01/08/2013 1120   APPEARANCEUR HAZY* 01/08/2013 1120   LABSPEC >1.030* 01/08/2013 1120   PHURINE 5.0 01/08/2013 1120   GLUCOSEU NEGATIVE 01/08/2013 1120   HGBUR TRACE* 01/08/2013 1120   BILIRUBINUR SMALL* 01/08/2013 1120   KETONESUR NEGATIVE 01/08/2013 1120   PROTEINUR 100* 01/08/2013 1120   UROBILINOGEN 0.2 01/08/2013 1120   NITRITE NEGATIVE 01/08/2013 1120   LEUKOCYTESUR NEGATIVE 01/08/2013 1120    RADIOGRAPHIC STUDIES: No results found.  ASSESSMENT:  #1. 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 Episode of right inguinal incarcerated hernia with small bowel obstruction, relieved conservatively with replacement. #3. Chronic kidney disease with episodes of hyperkalemia, currently controlled.      PLAN:  #1. If ferritin is low, IV iron will be administered. #2. Followup in 6 months with CBC, chem profile, ferritin, and soluble transferrin receptor.   All questions were answered. The patient knows to call the  clinic with any problems, questions or concerns. We can certainly see the patient much sooner if necessary.   I spent 25 minutes counseling the patient face to face. The total time spent in the appointment was 30 minutes.    Doroteo Bradford, MD 09/28/2013 9:27 AM  DISCLAIMER:  This note was dictated with voice recognition software.  Similar sounding words can inadvertently be transcribed inaccurately and may not be corrected upon review.

## 2013-09-28 NOTE — Patient Instructions (Signed)
..  [facility] Discharge Instructions  RECOMMENDATIONS MADE BY THE CONSULTANT AND ANY TEST RESULTS WILL BE SENT TO YOUR REFERRING PHYSICIAN.  EXAM FINDINGS BY THE PHYSICIAN TODAY AND SIGNS OR SYMPTOMS TO REPORT TO CLINIC OR PRIMARY PHYSICIAN: Exam and findings as discussed by DrMarland Kitchen [place]. We will call you if your iron is low INSTRUCTIONS/FOLLOW-UP: Return in 6 months  Thank you for choosing [facility] to provide your oncology and hematology care.  To afford each patient quality time with our providers, please arrive at least 15 minutes before your scheduled appointment time.  With your help, our goal is to use those 15 minutes to complete the necessary work-up to ensure our physicians have the information they need to help with your evaluation and healthcare recommendations.    Effective January 1st, 2014, we ask that you re-schedule your appointment with our physicians should you arrive 10 or more minutes late for your appointment.  We strive to give you quality time with our providers, and arriving late affects you and other patients whose appointments are after yours.    Again, thank you for choosing [facility].  Our hope is that these requests will decrease the amount of time that you wait before being seen by our physicians.       _____________________________________________________________  Should you have questions after your visit to [facility], please contact our office at [phone] between the hours of 8:30 a.m. and 4:30 p.m.  Voicemails left after 4:30 p.m. will not be returned until the following business day.  For prescription refill requests, have your pharmacy contact our office with your prescription refill request.    _______________________________________________________________  We hope that we have given you very good care.  You may receive a patient satisfaction survey in the mail, please complete it and  return it as soon as possible.  We value your feedback!  _______________________________________________________________  Have you asked about our STAR program?  STAR stands for Survivorship Training and Rehabilitation, and this is a nationally recognized cancer care program that focuses on survivorship and rehabilitation.  Cancer and cancer treatments may cause problems, such as, pain, making you feel tired and keeping you from doing the things that you need or want to do. Cancer rehabilitation can help. Our goal is to reduce these troubling effects and help you have the best quality of life possible.  You may receive a survey from a nurse that asks questions about your current state of health.  Based on the survey results, all eligible patients will be referred to the [facility] program for an evaluation so we can better serve you!  A frequently asked questions sheet is available upon request.

## 2013-10-02 LAB — SOLUBLE TRANSFERRIN RECEPTOR: Transferrin Receptor, Soluble: 1.46 mg/L (ref 0.76–1.76)

## 2013-10-08 ENCOUNTER — Other Ambulatory Visit: Payer: Self-pay

## 2013-10-08 ENCOUNTER — Telehealth: Payer: Self-pay

## 2013-10-08 DIAGNOSIS — R1033 Periumbilical pain: Secondary | ICD-10-CM

## 2013-10-08 NOTE — Telephone Encounter (Signed)
Pt is aware. Orders for UA sent to lab at [facility].

## 2013-10-08 NOTE — Telephone Encounter (Signed)
Certainly, we can arrange to check a urinalysis. However, if he develops severe pain like that which he had associated with his incarcerated umbilical hernia, he needs to present himself to the emergency department.

## 2013-10-08 NOTE — Telephone Encounter (Signed)
I spoke with the pt. He is having a mild upper abd pain, just above his navel. No N/V. No fever. He is having some frequency with urination but no burning or pain with urination.   He wants to know if we can check his urine to see if its a UTI. He has appt with nephrologist but its not until November.

## 2013-10-08 NOTE — Telephone Encounter (Signed)
Pt is calling today and want to be seen. His stomach is hurting. He states that it is a dull ache. He has diarrhea but that is normal for him. Please advise

## 2013-10-10 LAB — URINALYSIS
BILIRUBIN URINE: NEGATIVE
GLUCOSE, UA: NEGATIVE mg/dL
KETONES UR: NEGATIVE mg/dL
Leukocytes, UA: NEGATIVE
Nitrite: NEGATIVE
PROTEIN: NEGATIVE mg/dL
Specific Gravity, Urine: 1.007 (ref 1.005–1.030)
UROBILINOGEN UA: 0.2 mg/dL (ref 0.0–1.0)
pH: 5 (ref 5.0–8.0)

## 2013-10-17 ENCOUNTER — Other Ambulatory Visit: Payer: Self-pay

## 2013-10-17 DIAGNOSIS — K7581 Nonalcoholic steatohepatitis (NASH): Secondary | ICD-10-CM

## 2013-10-22 ENCOUNTER — Encounter: Payer: Self-pay | Admitting: Internal Medicine

## 2013-11-05 ENCOUNTER — Telehealth: Payer: Self-pay | Admitting: Internal Medicine

## 2013-11-05 DIAGNOSIS — K7460 Unspecified cirrhosis of liver: Secondary | ICD-10-CM

## 2013-11-05 NOTE — Telephone Encounter (Signed)
PATIENT CALLED INQUIRING IF HIS POTASSIUM WAS GOING TO BE CHECKED ON HIS LAB WORK HE IS TO HAVE DONE  PLEASE ADVISE [phone]

## 2013-11-09 NOTE — Telephone Encounter (Signed)
We are only checking a magnesium this time for this pt. He wants to know if we can check his potassium next week as well? (pt uses the [facility] lab) told him if it was ok, I would fax all of his orders on Monday.   Dr.Rourk, can we check pts potassium next week when he goes for blood work?

## 2013-11-11 NOTE — Telephone Encounter (Signed)
Why are we only checking Mg?  I agree with checking BMET at same time/

## 2013-11-13 ENCOUNTER — Other Ambulatory Visit: Payer: Self-pay

## 2013-11-13 DIAGNOSIS — K7460 Unspecified cirrhosis of liver: Secondary | ICD-10-CM

## 2013-11-13 NOTE — Addendum Note (Signed)
Addended by: Claudina Lick on: 11/13/2013 08:34 AM   Modules accepted: Orders

## 2013-11-13 NOTE — Telephone Encounter (Signed)
Lab order done and faxed to lab at [facility].

## 2013-11-14 ENCOUNTER — Telehealth: Payer: Self-pay | Admitting: Internal Medicine

## 2013-11-14 NOTE — Telephone Encounter (Signed)
noted 

## 2013-11-14 NOTE — Telephone Encounter (Signed)
Patient called for JL to let her know that he went to [facility] this morning and they did labs there and will be faxing those results to [place].

## 2013-11-19 ENCOUNTER — Telehealth (HOSPITAL_COMMUNITY): Payer: Self-pay | Admitting: Oncology

## 2013-11-19 NOTE — Telephone Encounter (Signed)
Note from Dr. Rayetta Humphrey at [facility] faxed [place] results of labs performed at her clinic.  She reports that the patient was seen for hyperkalemia and CKD.  He has a polyclonal significant elevation in light chains and notes that she DOES NOT suspect this is a cause of renal disease, but a reflection of decreased clearance of serum free light chains.  Urinary protein excretion is noted to be < 500 mg.  We agree with this assessment.  [name] 11/19/2013

## 2013-12-14 ENCOUNTER — Encounter: Payer: Self-pay | Admitting: *Deleted

## 2013-12-14 ENCOUNTER — Ambulatory Visit: Payer: Self-pay | Admitting: Cardiovascular Disease

## 2013-12-21 ENCOUNTER — Ambulatory Visit: Payer: Medicare Other | Admitting: Internal Medicine

## 2013-12-25 ENCOUNTER — Encounter: Payer: Self-pay | Admitting: Internal Medicine

## 2013-12-25 ENCOUNTER — Ambulatory Visit (INDEPENDENT_AMBULATORY_CARE_PROVIDER_SITE_OTHER): Payer: Medicare Other | Admitting: Internal Medicine

## 2013-12-25 VITALS — BP 129/67 | HR 62 | Temp 97.2°F | Ht 73.0 in | Wt 200.2 lb

## 2013-12-25 DIAGNOSIS — R1909 Other intra-abdominal and pelvic swelling, mass and lump: Secondary | ICD-10-CM

## 2013-12-25 DIAGNOSIS — K219 Gastro-esophageal reflux disease without esophagitis: Secondary | ICD-10-CM

## 2013-12-25 DIAGNOSIS — K7682 Hepatic encephalopathy: Secondary | ICD-10-CM

## 2013-12-25 DIAGNOSIS — K7290 Hepatic failure, unspecified without coma: Secondary | ICD-10-CM

## 2013-12-25 DIAGNOSIS — K7460 Unspecified cirrhosis of liver: Secondary | ICD-10-CM

## 2013-12-25 LAB — BASIC METABOLIC PANEL
BUN: 30 mg/dL — AB (ref 6–23)
CALCIUM: 8.4 mg/dL (ref 8.4–10.5)
CHLORIDE: 104 meq/L (ref 96–112)
CO2: 28 meq/L (ref 19–32)
CREATININE: 1.94 mg/dL — AB (ref 0.50–1.35)
Glucose, Bld: 130 mg/dL — ABNORMAL HIGH (ref 70–99)
Potassium: 4.3 mEq/L (ref 3.5–5.3)
Sodium: 137 mEq/L (ref 135–145)

## 2013-12-25 NOTE — Patient Instructions (Signed)
   Bactroban ointment to left groin lesion 3 times a day  Warm compresses to left groin and between Bactroban applications  Itching you Xifaxan lactulose  Basic metabolic profile today to Keep appointment with urologist next month and with [facility] hepatology in March of next year  Continue Protonix daily for acid reflux  Continue Inderal daily  Office visit here in 3 months.

## 2013-12-25 NOTE — Progress Notes (Signed)
Primary Care Physician:  Leonides Grills, MD Primary Gastroenterologist:  Dr. Gala Romney  Pre-Procedure History & Physical: HPI:  [name] is a 68 y.o. male with numerous medical problems including [name] cirrhosis recurrent GI bleeding requiring TIPS, renal failure;  history of Crohn's disease in remission and secondary hepatic encephalopathy. Last seen August of this year here. [facility] got him a supply of Xifaxan for 3 months. He is currently taking that along with lactulose. 4 bowel movements daily. Thinking clearly these days. Renal failure with borderline high potassium. No recent recheck. He is going to have cystoscopy at [facility] on January 10. He follows up with you and see hepatology in March of 2016. New complaint of right groin bulge bloody-appearing moles which bleeds a little no pus. Again, no pain. No fever. Has had this problem for 10 days he says over the past several days has been using triple antibiotic ointment on it and it has improved. Past Medical History  Diagnosis Date  . Hypertension     negative stress nuclear study in 02/2007; normal coronaries in 2006; Echo in 2006-aortic sclerosis, Mild biatrial enlargement; normal LV  . Pulmonary hypertension     Mild pulmonary hypertension with normal PVR by 2010 cath [facility])  . Hyperlipidemia   . Syncope 2008    orthostatic hypotension  . Diabetes mellitus     insulin; onset in 1999; retinopathy, neuropathy and nephropathy  . Chronic atrial fibrillation     onset in 1995; anticoagulation discontinued in 2006  . Crohn's disease     -lower GI bleed in 08/2002;  Pentasa stopped by Dr. Marty Heck at [facility] ; clinical remission.  . Obesity   . NASH (nonalcoholic steatohepatitis)     cirrhosis with esophageal varices; h/o GI bleeding. EGD-2010 grade 2 esophageal varices-banded with good hemostasis; portal gastropathy, friable gastric mucosa; hiatal hernia.  EGD- recurrent melena one week later showed 2 eschars at previously placed  bands, decompressed esophageal varices grade 1-2,four columns, no bleeding stigmata, normal appearing small bowel, pt has had 1 Hep A/B vaccine.    . Esophageal varices     s/p band ligation multiple times, Portal Hypertensive gastropathy;  . S/P TIPS (transjugular intrahepatic portosystemic shunt)     06/2009-TIPS procedure at [facility]  . GI bleed     07/2009-admitted with recurrent upper GI bleed secondary to esophageal varices-banded x3  . Pleural effusion, left     s/p thoracentesis-multiple  . Depression   . Anemia     Iron def requiring parenteral iron or injections in past, followed by Dr. Everardo All; B12 def  . Duodenal ulcer 1980s  . Ascites   . Hiatal hernia 12/02/2009  . Esophageal erosions     consistent with mild erosive reflux esophagitis  . Portal hypertensive gastropathy   . 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 Gala Romney  . Cancer 10/2011  . Squamous cell carcinoma of face   . Pneumonia     long time ago  . Shortness of breath   . GERD (gastroesophageal reflux disease)   . Arthritis     knees & ankles   . S/P radiation therapy 01/07/12 thru 03/21/2012    Dr. Orlene Erm; Right cheek/parotid and neck nodes  . Chronic kidney disease     stage III; creatinine of 1.6 in 8/07, 1.35 and 05/2008  . Nephrolithiasis     cystoscopy with stone extraction in 1990,   . Morbid obesity 09/20/2012  . Coagulopathy 09/20/2012  .  SBO (small bowel obstruction)     Past Surgical History  Procedure Laterality Date  . Tips procedure  09/16/09  . 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 Cholecystectomy    . Circumcision  -1992  . Colonoscopy  -2008    Reportedly normal, done in [place]  . Multiple thoracenteses    . Esophagogastroduodenoscopy  12/02/2009    distal esophageal erosions, small hiatal hernia  . Tips procedure  2011  . Colonoscopy  01/26/2011    RMR: adequate preparation.  normal rectum. Somewhat   diffusely friable colonic mucosa. A single 4 mm polyp in the ascending segment. couple of mucosal breaks in the distal 10 cm of terminal ileum with  a couple of erosions at the junction  the TI  and colon.. Tubular adenoma, chronic ileitis. Next TCS 01/2016.  Marland Kitchen Esophagogastroduodenoscopy  01/26/2011    RMR: Decompressed/abscent esophagel varices s/p TIPS. small hh. GAVE. Procedure: ESOPHAGOGASTRODUODENOSCOPY (EGD);  Surgeon: Daneil Dolin, MD;  [facility];  Service: Endoscopy;  Laterality: N/A;  . Esophagogastroduodenoscopy  04/22/2011    RMR: GAVE.-status post APC ablation as described above  . Esophagogastroduodenoscopy  05/20/2011    RMR: Portal gastropathy.  GAVE - status post APC ablation  . Hot hemostasis  05/20/2011    Procedure: HOT HEMOSTASIS (ARGON PLASMA COAGULATION/BICAP);  Surgeon: Daneil Dolin, MD;  [facility];  Service: Endoscopy;  Laterality: N/A;  . Esophagogastroduodenoscopy  07/14/2005    Persisting two columns of grade 2 esophageal varices with one cherry red spot on one of these columns distally.  Otherwise normal  . Skin cancer excision  Oct 2013    large lesion rt side of face  . Cardiac catheterization       04/12/2008 [facility]): no significant CAD, mild pulmonary hypertension with normal PVR, normal pulmonary capillary wedge pressure, normal left ventricular end-diastolic pressure.  . Multiple extractions with alveoloplasty  01/14/2012    Procedure: MULTIPLE EXTRACION WITH ALVEOLOPLASTY;  Surgeon: Lenn Cal, DDS;  Location: [place];  Service: Oral Surgery;  Laterality: N/A;  Extraction of tooth #'s [phone] with alveoloplasty and gross debridement of remaining teeth    Prior to Admission medications   Medication Sig Start Date End Date Taking? Authorizing Provider  ALPRAZolam Duanne Moron) 0.5 MG tablet Take 0.5 mg by mouth daily as needed.  02/08/11  Yes Historical Provider, MD  ciprofloxacin-dexamethasone (CIPRODEX) otic suspension Place 4 drops  into the left ear at bedtime.   Yes Historical Provider, MD  digoxin (LANOXIN) 0.125 MG tablet Take 125 mcg by mouth. Takes on Mondays, Wednesdays and Fridays if heart rate above 60 12/09/10  Yes Historical Provider, MD  furosemide (LASIX) 40 MG tablet Take 20 mg by mouth daily.   Yes Historical Provider, MD  insulin aspart (NOVOLOG) 100 UNIT/ML injection Inject 0-12 Units into the skin 2 (two) times daily. Patient is on Sliding Scale   Yes Historical Provider, MD  insulin glargine (LANTUS) 100 UNIT/ML injection Inject 20 Units into the skin at bedtime.   Yes Historical Provider, MD  lactulose (CHRONULAC) 10 GM/15ML solution Take 30 g by mouth 2 (two) times daily.   Yes Orvil Feil, NP  lisinopril (PRINIVIL,ZESTRIL) 5 MG tablet Take 5 mg by mouth daily.    Yes Historical Provider, MD  ondansetron (ZOFRAN) 4 MG tablet Take 4 mg by mouth every 8 (eight) hours as needed.    Yes  Historical Provider, MD  oxyCODONE (OXY IR/ROXICODONE) 5 MG immediate release tablet  08/31/13  Yes Historical Provider, MD  pantoprazole (PROTONIX) 40 MG tablet Take 1 tablet (40 mg total) by mouth 2 (two) times daily before a meal. 04/04/13  Yes Mahala Menghini, PA-C  propranolol (INDERAL) 20 MG tablet Take 20 mg by mouth 2 (two) times daily.   Yes Historical Provider, MD  rifaximin (XIFAXAN) 550 MG TABS tablet Take 550 mg by mouth 2 (two) times daily.    Yes Historical Provider, MD  simvastatin (ZOCOR) 40 MG tablet 40 mg. Take 40 mg by mouth nightly. 12/09/10  Yes Historical Provider, MD  Zinc Oxide (TRIPLE PASTE) 12.8 % ointment Apply 1 application topically Three (3) times a day. 01/30/13  Yes Historical Provider, MD  simvastatin (ZOCOR) 20 MG tablet  07/03/13   Historical Provider, MD    Allergies as of 12/25/2013 - Review Complete 09/28/2013  Allergen Reaction Noted  . Aspirin Other (See Comments) 12/07/2012  . Codeine Other (See Comments)   . Tricode ar [pseudoeph-chlorphen-codeine]  05/27/2012  . Other Rash 05/05/2010  .  Tape Rash 12/07/2012    Family History  Problem Relation Age of Onset  . Colon cancer Neg Hx   . Inflammatory bowel disease Neg Hx   . Liver disease Neg Hx   . Anesthesia problems Neg Hx   . Hypotension Neg Hx   . Malignant hyperthermia Neg Hx   . Pseudochol deficiency Neg Hx   . Heart attack Father   . COPD Mother   . Diabetes Mother   . Hypertension Mother   . Heart failure Mother   . Heart disease Mother   . Hypertension Sister   . Diabetes Sister     History   Social History  . Marital Status: Divorced    Spouse Name: N/A    Number of Children: 1  . Years of Education: N/A   Occupational History  . retired     disabled   Social History Main Topics  . Smoking status: Never Smoker   . Smokeless tobacco: Never Used  . Alcohol Use: No  . Drug Use: No  . Sexual Activity: Yes    Birth Control/ Protection: None   Other Topics Concern  . Not on file   Social History Narrative   The patient is divorced. The patient had one son.   Patient denies ever having smoked or used smokeless tobacco products. Patient denies current use of alcohol.      Mother and father are both deceased.    Review of Systems: See HPI, otherwise negative ROS  Physical Exam: BP 129/67 mmHg  Pulse 62  Temp(Src) 97.2 F (36.2 C)  Ht 6\' 1"  (1.854 m)  Wt 200 lb 3.2 oz (90.81 kg)  BMI 26.42 kg/m2 General:  Frail, gaunt, chronically ill appearing individual, pleasant and cooperative in NAD Skin:  No jaundice.. Eyes:  Sclera clear, no icterus.   Conjunctiva pink. Ears:  Normal auditory acuity. Nose:  No deformity, discharge,  or lesions. Mouth:  No deformity or lesions. Neck:  Supple; no masses or thyromegaly. No significant cervical adenopathy. Lungs:  Clear throughout to auscultation.   No wheezes, crackles, or rhonchi. No acute distress. Heart:  Regular rate and rhythm; no murmurs, clicks, rubs,  or gallops. Abdomen: Nondistended. Soft and nontender. No obvious ascites.  Patient has  a elongated ping-pong ball size relatively superficial mass in the right groin. Almost looks like small amount of blister present. Nontender  to palpation. It is relatively mobile.  No pus expressible. Pulses:  Normal pulses noted. Extremities:  Without clubbing or edema.  Impression:  Pleasant 68 year old gentleman with [name] cirrhosis along with multiple other morbidities - all relatively stable from a GI/hepatology standpoint. New problem in the right groin. This is most unusual in appearance to me. I called Dr. Aviva Signs and discussed. Possiblities include an infected hair follicle or possibly a infected lymph node (LGV or something along those lines) . It is superficial and I certainly doubt it is any component of a hernia. Per patient report, it is improving. He has no fever. It is nontender.  Recommendations:  As discussed with Dr. Arnoldo Morale:  Bactroban ointment to left groin lesion 3 times a day  Warm compresses to left groin and between Bactroban applications - To the emergency Department if condition worsens.  Continue using Xifaxan and lactulose.    Basic metabolic profile today.  Keep appointment with urologist next month and with [facility] hepatology in March of next year  Continue Protonix daily for acid reflux  Continue Inderal daily  Office visit here in 3 months.

## 2013-12-26 ENCOUNTER — Other Ambulatory Visit: Payer: Self-pay | Admitting: Internal Medicine

## 2013-12-26 MED ORDER — MUPIROCIN 2 % EX OINT: TOPICAL_OINTMENT | CUTANEOUS | Status: DC

## 2014-01-01 ENCOUNTER — Telehealth: Payer: Self-pay | Admitting: Internal Medicine

## 2014-01-01 ENCOUNTER — Encounter: Payer: Self-pay | Admitting: *Deleted

## 2014-01-01 NOTE — Telephone Encounter (Signed)
PATIENT CALLED INQUIRING ABOUT POTASSIUM LEVEL FROM LABS DRAWN

## 2014-01-01 NOTE — Telephone Encounter (Signed)
I spoke with the pt about his blood work results.

## 2014-01-07 ENCOUNTER — Other Ambulatory Visit: Payer: Self-pay | Admitting: Internal Medicine

## 2014-04-01 ENCOUNTER — Ambulatory Visit (HOSPITAL_COMMUNITY): Payer: Self-pay | Admitting: Hematology & Oncology

## 2014-04-01 ENCOUNTER — Other Ambulatory Visit (HOSPITAL_COMMUNITY): Payer: Self-pay

## 2014-04-02 ENCOUNTER — Telehealth: Payer: Self-pay | Admitting: General Practice

## 2014-04-02 NOTE — Telephone Encounter (Signed)
Spoke with Amy- the home health nurse from [facility]- he was discharged from [facility] Sunday and now he is not doing well. He is out of xifaxan. (we do not have any samples) nurse said he is not himself today and has labored breathing and she thinks he may have some slight jaundice. He has some swelling but not a lot. He is taking lactulose 20mg  tid. His last bm was at 5pm yesterday. He is not having a lot of bm's but they feel like its d/t him not eating. They told him when he left the hospital that his ammonia level was elevated but not high.   Pt has ov on Friday with [facility]. Is there anything we need to do for him prior to his ov?   I tried to contact our xifaxan rep. He is out of samples at this time and said it will be a couple of weeks before he has any. He is going to check with the other reps and his manager and see if they can get some.

## 2014-04-02 NOTE — Telephone Encounter (Signed)
It sounds like there could be some more acute issues going on ie labored breathing.   I reviewed his recent discharge information and there was some chronic left lower lobe consolidation, unclear if significant acutely. His recent labs indicated stable Hgb, LFTs, and creatinine. His ammonia level was slightly elevated but increasing. On 03/29/14 was <8, at discharge was 49.   If he is having labored breathing, he should go to the ER.  If they can't get him to take lactulose he should go to the ER. Increase fluid consumption. Increase lactulose to 28mL TID, titrate to 2-3 soft BM daily.  Let us know what they plan to do.

## 2014-04-02 NOTE — Telephone Encounter (Signed)
Sharyn Lull, pts daughter in law is aware. She said his breathing is only labored when he is laying down. If he is up in the chair or propped up in bed his breathing is not labored. They will take him to the ED if they feel like he is getting worse, if he developes a cough or fever. They are going to increase his lactulose and try to make him take. I stressed how important it was for him and she stated she understood. They are increasing his fluids. I informed her that I spoke with the xifaxan rep and he is out of samples for another 2 weeks but he is going to call around and see if he can find [place] some xifaxan to give him since he is out right now. Rep is supposed to call me back if he can find some. They are coming to his appt on Friday.

## 2014-04-02 NOTE — Telephone Encounter (Signed)
Let's call and check on him tomorrow please.

## 2014-04-02 NOTE — Telephone Encounter (Signed)
Patient's dauighterinlaw called in stating the home health nurse wants [name] seen ASAP in regards to his liver.  Routing to [place] to speak with the home health nurse, Amy.

## 2014-04-03 ENCOUNTER — Ambulatory Visit: Payer: Self-pay | Admitting: Nurse Practitioner

## 2014-04-03 NOTE — Telephone Encounter (Signed)
Sharyn Lull called and told Ginger that they were in an ambulance on their way to [facility].

## 2014-04-03 NOTE — Telephone Encounter (Signed)
Spoke with Sharyn Lull, she said pt is not doing well. They are on their way to take him to Dr.Fusco now, he has an appt with him at 2pm. He is refusing his medications, he is not taking the lactulose. He is having problems comprehending anything that they are telling him and he wont eat. She was in tears as I was talking to her. Asked her to call us if they needed [place].

## 2014-04-03 NOTE — Telephone Encounter (Signed)
Noted  

## 2014-04-03 NOTE — Telephone Encounter (Signed)
Sounds like he is encephalopathic and will require admission.

## 2014-04-05 ENCOUNTER — Ambulatory Visit: Payer: Medicare Other | Admitting: Nurse Practitioner

## 2014-04-10 ENCOUNTER — Ambulatory Visit (HOSPITAL_COMMUNITY): Payer: Self-pay | Admitting: Hematology & Oncology

## 2014-04-10 ENCOUNTER — Other Ambulatory Visit (HOSPITAL_COMMUNITY): Payer: Self-pay

## 2014-04-10 NOTE — Progress Notes (Deleted)
[name], [place] [address]    DIAGNOSIS:  NASH, cirrhosis with hyperspleniam   Iron deficiency   CURRENT THERAPY:  INTERVAL HISTORY: [name] 69 y.o. male returns for   MEDICAL HISTORY: Past Medical History  Diagnosis Date  . Hypertension     negative stress nuclear study in 02/2007; normal coronaries in 2006; Echo in 2006-aortic sclerosis, Mild biatrial enlargement; normal LV  . Pulmonary hypertension     Mild pulmonary hypertension with normal PVR by 2010 cath [facility])  . Hyperlipidemia   . Syncope 2008    orthostatic hypotension  . Diabetes mellitus     insulin; onset in 1999; retinopathy, neuropathy and nephropathy  . Chronic atrial fibrillation     onset in 1995; anticoagulation discontinued in 2006  . Crohn's disease     -lower GI bleed in 08/2002;  Pentasa stopped by Dr. Marty Heck at [facility] ; clinical remission.  . Obesity   . NASH (nonalcoholic steatohepatitis)     cirrhosis with esophageal varices; h/o GI bleeding. EGD-2010 grade 2 esophageal varices-banded with good hemostasis; portal gastropathy, friable gastric mucosa; hiatal hernia.  EGD- recurrent melena one week later showed 2 eschars at previously placed bands, decompressed esophageal varices grade 1-2,four columns, no bleeding stigmata, normal appearing small bowel, pt has had 1 Hep A/B vaccine.    . Esophageal varices     s/p band ligation multiple times, Portal Hypertensive gastropathy;  . S/P TIPS (transjugular intrahepatic portosystemic shunt)     06/2009-TIPS procedure at [facility]  . GI bleed     07/2009-admitted with recurrent upper GI bleed secondary to esophageal varices-banded x3  . Pleural effusion, left     s/p thoracentesis-multiple  . Depression   . Anemia     Iron def requiring parenteral iron or injections in past, followed by Dr. Everardo All; B12 def  . Duodenal ulcer 1980s  . Ascites   . Hiatal hernia 12/02/2009  . Esophageal erosions    consistent with mild erosive reflux esophagitis  . Portal hypertensive gastropathy   . 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 Gala Romney  . Cancer 10/2011  . Squamous cell carcinoma of face   . Pneumonia     long time ago  . Shortness of breath   . GERD (gastroesophageal reflux disease)   . Arthritis     knees & ankles   . S/P radiation therapy 01/07/12 thru 03/21/2012    Dr. Orlene Erm; Right cheek/parotid and neck nodes  . Chronic kidney disease     stage III; creatinine of 1.6 in 8/07, 1.35 and 05/2008  . Nephrolithiasis     cystoscopy with stone extraction in 1990,   . Morbid obesity 09/20/2012  . Coagulopathy 09/20/2012  . SBO (small bowel obstruction)     has HYPERLIPIDEMIA; UMBILICAL HERNIA; Hepatic cirrhosis; Hypertension; Pulmonary hypertension; Diabetes mellitus; Chronic atrial fibrillation; Chronic kidney disease; NASH (nonalcoholic steatohepatitis); Esophageal varices; S/P TIPS (transjugular intrahepatic portosystemic shunt); Anemia; Epigastric pain; Heme positive stool; Edema of left lower extremity; Iron deficiency anemia; B12 deficiency anemia; Upper GI bleed; Melena; Blood loss anemia; Thrombocytopenia; Chronic diarrhea; Abdominal pain; Chronic periodontitis; Carotid bruit; Diabetes; Dehydration; Orthostatic hypotension; Hypotension, unspecified; Acute renal failure; Hypovolemia; Generalized weakness; Morbid obesity; Coagulopathy; Urinary hesitancy; SBO (small bowel obstruction); Inguinal hernia; Small bowel obstruction; Malnutrition of moderate degree; Metabolic acidosis; Hypokalemia; Atrial fibrillation with rapid ventricular response; and Right groin mass on his problem list.     is  allergic to aspirin; codeine; tricode ar; other; and tape.  [name] does not currently have medications on file.  SURGICAL HISTORY: Past Surgical History  Procedure Laterality Date  . Tips procedure  09/16/09  . Thoracic spine surgery  2008    Following a fall(T10 fracture subluzation  w/ DISH s/p fusion of T7-L1)  . Cholecystectomy    . Circumcision  -1992  . Colonoscopy  -2008    Reportedly normal, done in [place]  . Multiple thoracenteses    . Esophagogastroduodenoscopy  12/02/2009    distal esophageal erosions, small hiatal hernia  . Tips procedure  2011  . Colonoscopy  01/26/2011    RMR: adequate preparation.  normal rectum. Somewhat  diffusely friable colonic mucosa. A single 4 mm polyp in the ascending segment. couple of mucosal breaks in the distal 10 cm of terminal ileum with  a couple of erosions at the junction  the TI  and colon.. Tubular adenoma, chronic ileitis. Next TCS 01/2016.  Marland Kitchen Esophagogastroduodenoscopy  01/26/2011    RMR: Decompressed/abscent esophagel varices s/p TIPS. small hh. GAVE. Procedure: ESOPHAGOGASTRODUODENOSCOPY (EGD);  Surgeon: Daneil Dolin, MD;  [facility];  Service: Endoscopy;  Laterality: N/A;  . Esophagogastroduodenoscopy  04/22/2011    RMR: GAVE.-status post APC ablation as described above  . Esophagogastroduodenoscopy  05/20/2011    RMR: Portal gastropathy.  GAVE - status post APC ablation  . Hot hemostasis  05/20/2011    Procedure: HOT HEMOSTASIS (ARGON PLASMA COAGULATION/BICAP);  Surgeon: Daneil Dolin, MD;  [facility];  Service: Endoscopy;  Laterality: N/A;  . Esophagogastroduodenoscopy  07/14/2005    Persisting two columns of grade 2 esophageal varices with one cherry red spot on one of these columns distally.  Otherwise normal  . Skin cancer excision  Oct 2013    large lesion rt side of face  . Cardiac catheterization       04/12/2008 [facility]): no significant CAD, mild pulmonary hypertension with normal PVR, normal pulmonary capillary wedge pressure, normal left ventricular end-diastolic pressure.  . Multiple extractions with alveoloplasty  01/14/2012    Procedure: MULTIPLE EXTRACION WITH ALVEOLOPLASTY;  Surgeon: Lenn Cal, DDS;  Location: [place];  Service: Oral Surgery;  Laterality: N/A;  Extraction  of tooth #'s [phone] with alveoloplasty and gross debridement of remaining teeth    SOCIAL HISTORY: History   Social History  . Marital Status: Divorced    Spouse Name: N/A  . Number of Children: 1  . Years of Education: N/A   Occupational History  . retired     disabled   Social History Main Topics  . Smoking status: Never Smoker   . Smokeless tobacco: Never Used  . Alcohol Use: No  . Drug Use: No  . Sexual Activity: Yes    Birth Control/ Protection: None   Other Topics Concern  . Not on file   Social History Narrative   The patient is divorced. The patient had one son.   Patient denies ever having smoked or used smokeless tobacco products. Patient denies current use of alcohol.      Mother and father are both deceased.    FAMILY HISTORY: Family History  Problem Relation Age of Onset  . Colon cancer Neg Hx   . Inflammatory bowel disease Neg Hx   . Liver disease Neg Hx   . Anesthesia problems Neg Hx   . Hypotension Neg Hx   . Malignant hyperthermia Neg Hx   . Pseudochol deficiency Neg Hx   .  Heart attack Father   . COPD Mother   . Diabetes Mother   . Hypertension Mother   . Heart failure Mother   . Heart disease Mother   . Hypertension Sister   . Diabetes Sister     ROS  PHYSICAL EXAMINATION  ECOG PERFORMANCE STATUS: {CHL ONC ECOG JK:[id number]}  There were no vitals filed for this visit.  Physical Exam  LABORATORY DATA:  CBC    Component Value Date/Time   WBC 5.8 09/28/2013 0854   RBC 2.99* 09/28/2013 0854   RBC 3.40* 12/14/2010 1045   HGB 9.4* 09/28/2013 0854   HCT 27.7* 09/28/2013 0854   PLT 90* 09/28/2013 0854   MCV 92.6 09/28/2013 0854   MCH 31.4 09/28/2013 0854   MCHC 33.9 09/28/2013 0854   RDW 14.6 09/28/2013 0854   LYMPHSABS 0.7 09/28/2013 0854   MONOABS 0.7 09/28/2013 0854   EOSABS 0.2 09/28/2013 0854   BASOSABS 0.0 09/28/2013 0854   CMP     Component Value Date/Time   NA 137 12/25/2013 1227   K 4.3 12/25/2013 1227     CL 104 12/25/2013 1227   CO2 28 12/25/2013 1227   GLUCOSE 130* 12/25/2013 1227   BUN 30* 12/25/2013 1227   CREATININE 1.94* 12/25/2013 1227   CREATININE 2.10* 09/28/2013 0854   CALCIUM 8.4 12/25/2013 1227   CALCIUM 6.8* 01/09/2013 1754   PROT 7.2 09/28/2013 0854   ALBUMIN 2.6* 09/28/2013 0854   AST 15 09/28/2013 0854   ALT 8 09/28/2013 0854   ALKPHOS 122* 09/28/2013 0854   BILITOT 1.4* 09/28/2013 0854   GFRNONAA 31* 09/28/2013 0854   GFRAA 36* 09/28/2013 0854       ASSESSMENT and THERAPY PLAN:    No problem-specific assessment & plan notes found for this encounter.   All questions were answered. The patient knows to call the clinic with any problems, questions or concerns. We can certainly see the patient much sooner if necessary. This note was electronically signed. Molli Hazard 04/10/2014

## 2014-04-14 NOTE — Progress Notes (Signed)
This encounter was created in error - please disregard.

## 2014-04-15 ENCOUNTER — Telehealth: Payer: Self-pay

## 2014-04-15 ENCOUNTER — Telehealth: Payer: Self-pay | Admitting: Internal Medicine

## 2014-04-15 NOTE — Telephone Encounter (Signed)
I removed patient from the recall list. Patient is deceased.

## 2014-04-15 NOTE — Telephone Encounter (Signed)
I'm sorry to get that information. He suffered for a long time.

## 2014-04-15 NOTE — Telephone Encounter (Signed)
pts daughter-in-law, Sharyn Lull, called this morning and said that Mr.Hemminger had passed away at [facility] on 2022-04-11.

## 2014-05-05 DEATH — deceased

## 2015-02-26 IMAGING — CT CT ABD-PELV W/O CM
2 of 4 series · 16 of 46 positions shown, 18 images · non-contrast
Comparison: DG ABD ACUTE W/CHEST dated 01/07/2013;

CLINICAL DATA: SBO, R inguinal hernia (reduced on ER) SBO, R
inguinal hernia (reduced on ER), ER patient with a hx of HTN,
diabetes, A-FIB, Crohn's disease, NASH, esophageal varices, hiatal
hernia, TIPS procedure, gallbladder surgery, heart cath, thoracic
spine surgery.

EXAM:
CT ABDOMEN AND PELVIS WITHOUT CONTRAST
TECHNIQUE: Multidetector CT imaging of the abdomen and pelvis was performed
following the standard protocol without intravenous contrast.

[Series 2: abdomen/pelvis w/o contrast · axial · non-contrast · 0.75mm/px · z∈[+569,+1014]mm · 13 of 103 slices shown, 15 images]
[im 7/103  soft-tissue]
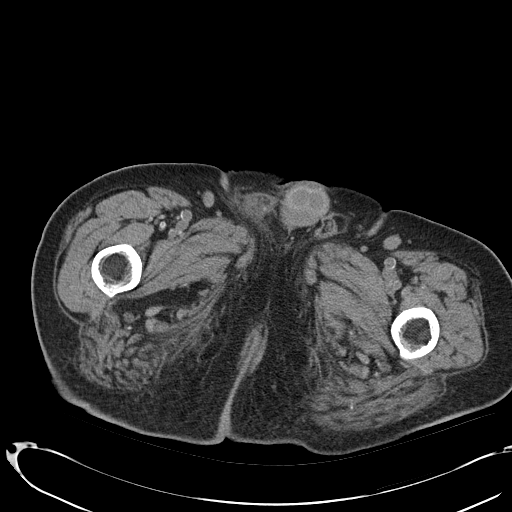
[im 7/103  bone]
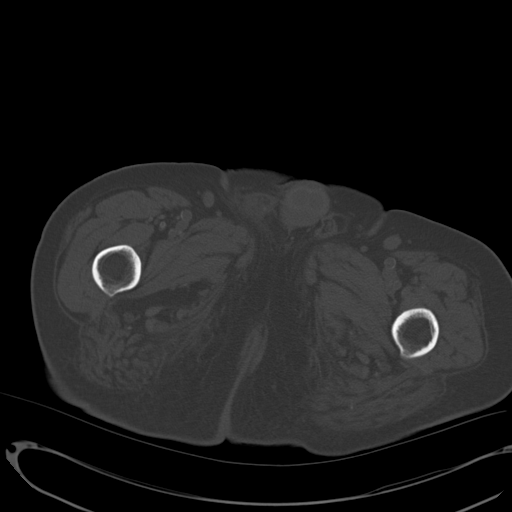
[im 14/103  soft-tissue]
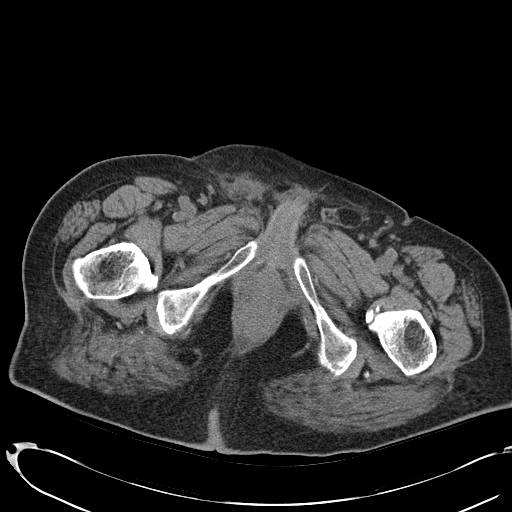
[im 21/103  soft-tissue]
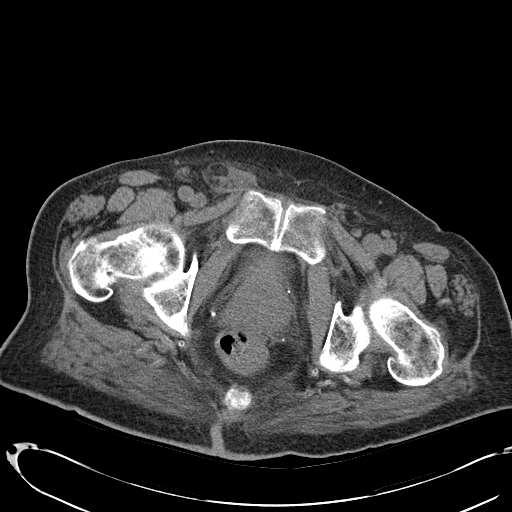
[im 28/103  soft-tissue]
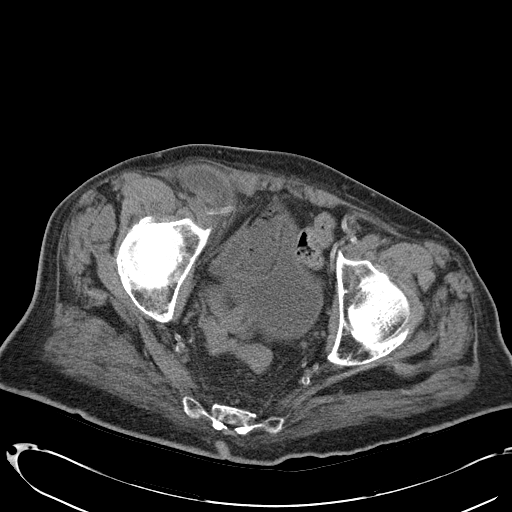
[im 35/103  soft-tissue]
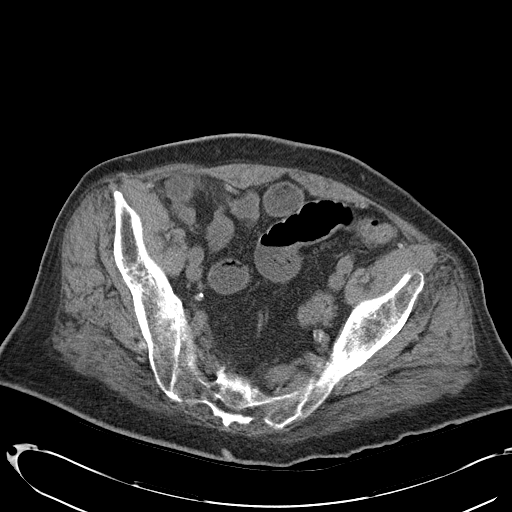
[im 41/103  soft-tissue]
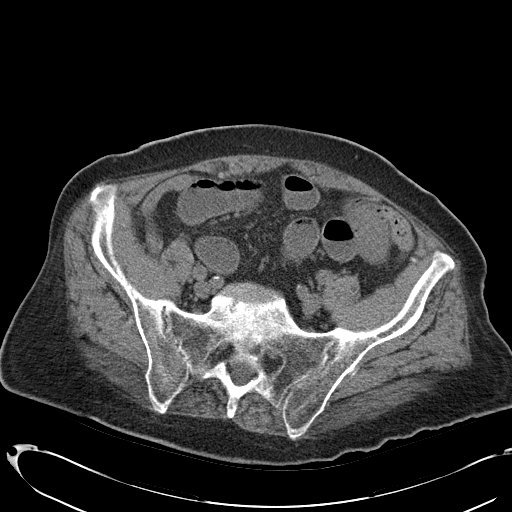
[im 55/103  soft-tissue]
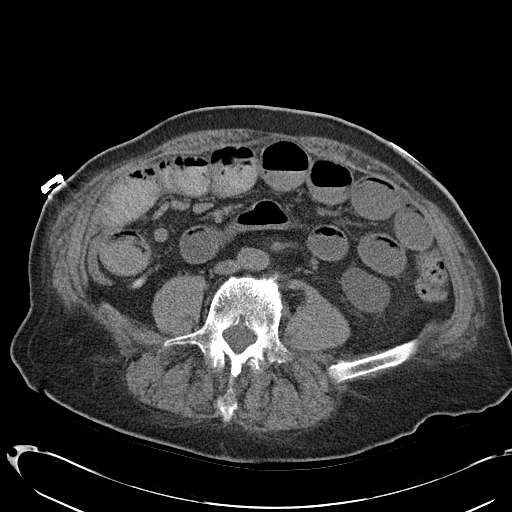
[im 62/103  soft-tissue]
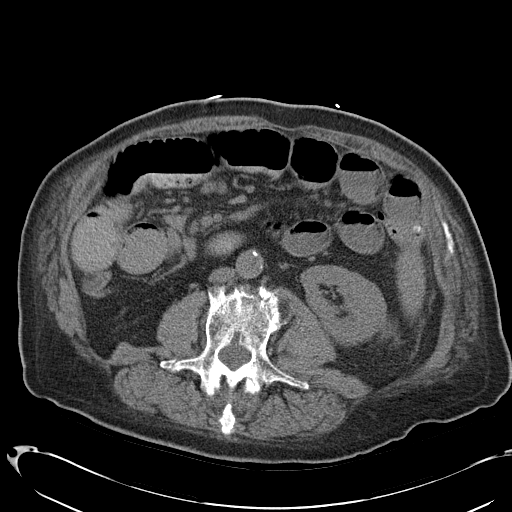
[im 69/103  soft-tissue]
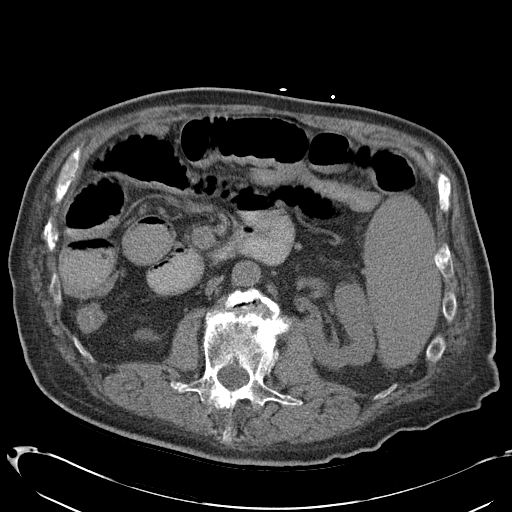
[im 69/103  bone]
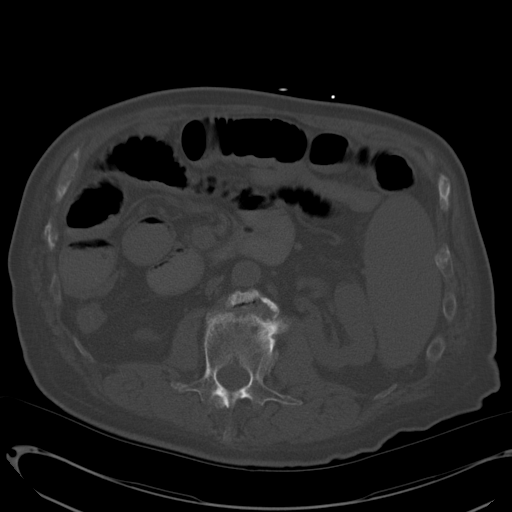
[im 75/103  soft-tissue]
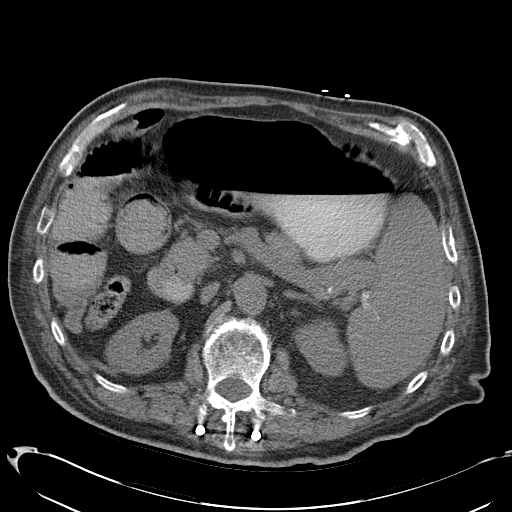
[im 82/103  soft-tissue]
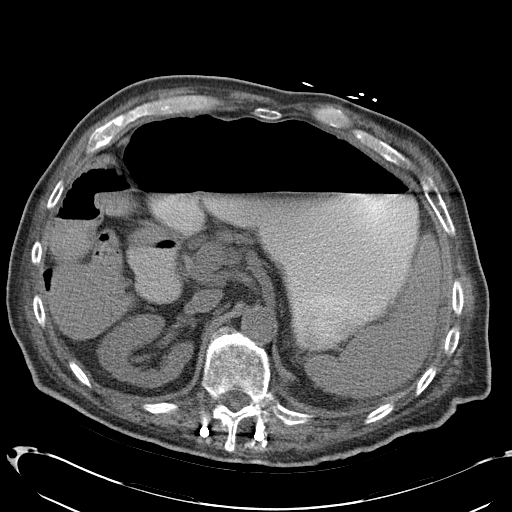
[im 89/103  soft-tissue]
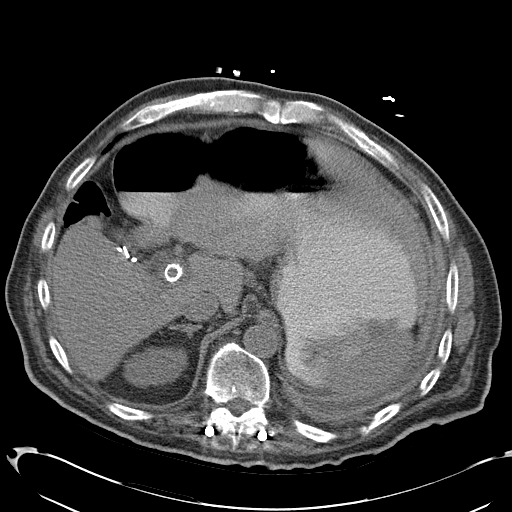
[im 96/103  soft-tissue]
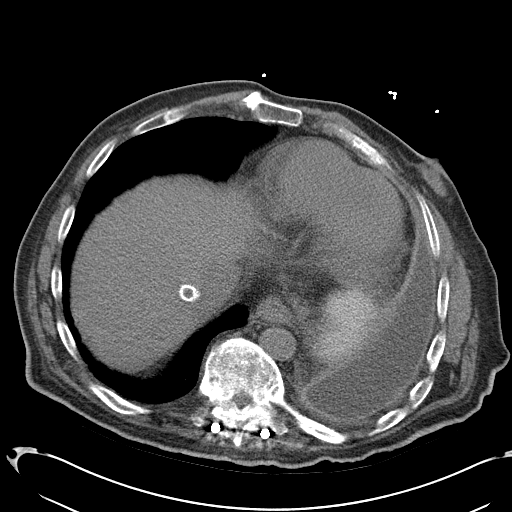

[Series 4: mpr cor (id) · coronal · 0.80mm/px · 3 of 98 slices shown]
[im 33/98  soft-tissue]
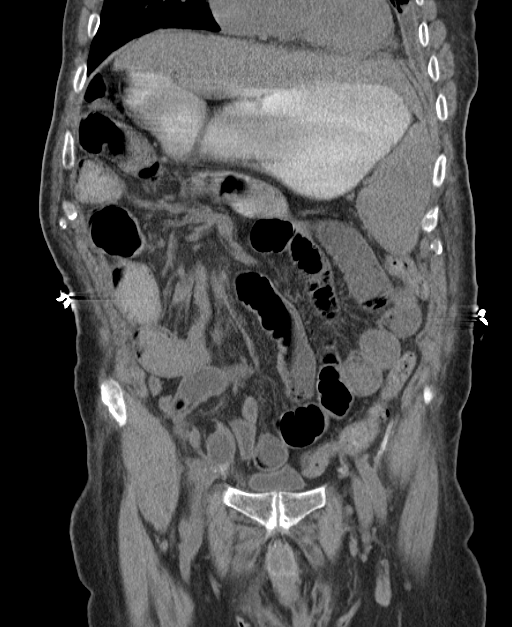
[im 44/98  soft-tissue]
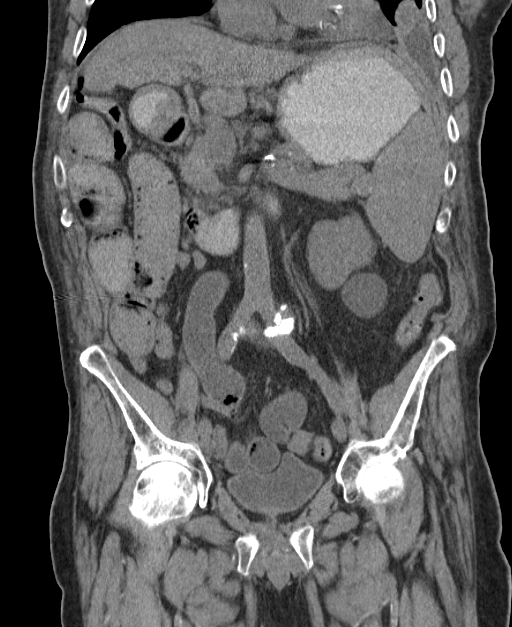
[im 54/98  soft-tissue]
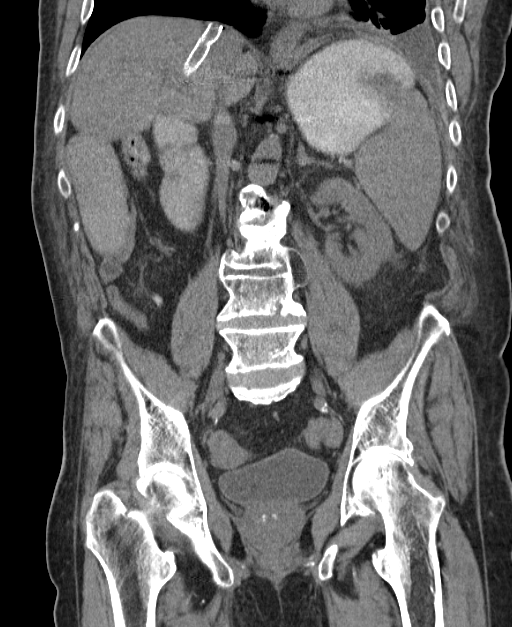

[16 of 46 positions shown; findings below may reference images not displayed]

US ABDOMEN
COMPLETE dated 11/09/2012; CT ENTERO ABD W/CM dated 03/04/2008; CT
ENTERO PELVIS W/CM dated 03/04/2008
FINDINGS: There is a small loculated left pleural effusion. There is a left
basilar airspace disease which may represent atelectasis versus
pneumonia.

The liver is diminutive in size with a nodular contour consistent
with cirrhosis. There is no intrahepatic or extrahepatic biliary
ductal dilatation. The gallbladder is surgically absent. The spleen
is mildly enlarged without focal abnormality. There is a hypodense,
fluid attenuating left renal mass most consistent with a cyst. The
right kidney, adrenal glands and pancreas are normal. The bladder is
unremarkable.

There is small bowel dilatation with multiple small bowel air-fluid
levels with an apparent transition zone at the right inguinal
hernia.

There is no pneumoperitoneum, pneumatosis, or portal venous gas.
There is no abdominal or pelvic free fluid. There is no
lymphadenopathy.

The abdominal aorta is normal in caliber with atherosclerosis. There
is a TIPS stent present. There are no lytic or sclerotic osseous
lesions. There is lumbar spine spondylosis. There is thoracic spine
posterior spinal fusion incompletely visualized.
IMPRESSION: 1. Small bowel obstruction with an apparent transition zone and a
partially reduced right inguinal hernia. The distal small bowel and
colon are decompressed.
2. Cirrhosis with a TIPS stent present.
3. Loculated left pleural effusion with left basilar airspace
disease which may represent atelectasis versus pneumonia.

## 2015-12-16 ENCOUNTER — Encounter: Payer: Self-pay | Admitting: Internal Medicine
